# Patient Record
Sex: Male | Born: 1950
Health system: Southern US, Community
[De-identification: ages and names within clinical notes are randomized; demographics above are authoritative.]

## PROBLEM LIST (undated history)

## (undated) DIAGNOSIS — N2889 Other specified disorders of kidney and ureter: Secondary | ICD-10-CM

## (undated) DIAGNOSIS — T4145XA Adverse effect of unspecified anesthetic, initial encounter: Secondary | ICD-10-CM

## (undated) DIAGNOSIS — Z9889 Other specified postprocedural states: Secondary | ICD-10-CM

## (undated) DIAGNOSIS — K219 Gastro-esophageal reflux disease without esophagitis: Secondary | ICD-10-CM

## (undated) DIAGNOSIS — C439 Malignant melanoma of skin, unspecified: Secondary | ICD-10-CM

## (undated) DIAGNOSIS — F419 Anxiety disorder, unspecified: Secondary | ICD-10-CM

## (undated) DIAGNOSIS — I10 Essential (primary) hypertension: Secondary | ICD-10-CM

## (undated) DIAGNOSIS — G4730 Sleep apnea, unspecified: Secondary | ICD-10-CM

## (undated) DIAGNOSIS — M1990 Unspecified osteoarthritis, unspecified site: Secondary | ICD-10-CM

## (undated) DIAGNOSIS — R112 Nausea with vomiting, unspecified: Secondary | ICD-10-CM

## (undated) DIAGNOSIS — T8859XA Other complications of anesthesia, initial encounter: Secondary | ICD-10-CM

## (undated) DIAGNOSIS — N400 Enlarged prostate without lower urinary tract symptoms: Secondary | ICD-10-CM

## (undated) DIAGNOSIS — J849 Interstitial pulmonary disease, unspecified: Secondary | ICD-10-CM

## (undated) DIAGNOSIS — E785 Hyperlipidemia, unspecified: Secondary | ICD-10-CM

## (undated) HISTORY — DX: Interstitial pulmonary disease, unspecified: J84.9

## (undated) HISTORY — PX: TOTAL KNEE ARTHROPLASTY: SHX125

## (undated) HISTORY — DX: Sleep apnea, unspecified: G47.30

## (undated) HISTORY — DX: Anxiety disorder, unspecified: F41.9

## (undated) HISTORY — DX: Malignant melanoma of skin, unspecified: C43.9

## (undated) HISTORY — DX: Hyperlipidemia, unspecified: E78.5

## (undated) HISTORY — PX: MELANOMA EXCISION: SHX5266

## (undated) HISTORY — DX: Gastro-esophageal reflux disease without esophagitis: K21.9

## (undated) HISTORY — PX: KNEE ARTHROSCOPY: SUR90

## (undated) HISTORY — PX: LASIK: SHX215

## (undated) HISTORY — DX: Unspecified osteoarthritis, unspecified site: M19.90

## (undated) HISTORY — PX: PROSTATE ABLATION: SHX6042

## (undated) HISTORY — DX: Benign prostatic hyperplasia without lower urinary tract symptoms: N40.0

## (undated) HISTORY — DX: Other specified disorders of kidney and ureter: N28.89

## (undated) HISTORY — DX: Essential (primary) hypertension: I10

## (undated) HISTORY — PX: VASECTOMY: SHX75

---

## 1999-07-03 ENCOUNTER — Ambulatory Visit (HOSPITAL_BASED_OUTPATIENT_CLINIC_OR_DEPARTMENT_OTHER): Admission: RE | Admit: 1999-07-03 | Discharge: 1999-07-03 | Payer: Self-pay | Admitting: Urology

## 1999-07-03 ENCOUNTER — Encounter (INDEPENDENT_AMBULATORY_CARE_PROVIDER_SITE_OTHER): Payer: Self-pay | Admitting: Specialist

## 2000-05-29 ENCOUNTER — Ambulatory Visit (HOSPITAL_BASED_OUTPATIENT_CLINIC_OR_DEPARTMENT_OTHER): Admission: RE | Admit: 2000-05-29 | Discharge: 2000-05-29 | Payer: Self-pay | Admitting: Orthopedic Surgery

## 2001-02-25 ENCOUNTER — Ambulatory Visit (HOSPITAL_BASED_OUTPATIENT_CLINIC_OR_DEPARTMENT_OTHER): Admission: RE | Admit: 2001-02-25 | Discharge: 2001-02-25 | Payer: Self-pay | Admitting: Orthopedic Surgery

## 2002-03-28 ENCOUNTER — Ambulatory Visit (HOSPITAL_BASED_OUTPATIENT_CLINIC_OR_DEPARTMENT_OTHER): Admission: RE | Admit: 2002-03-28 | Discharge: 2002-03-28 | Payer: Self-pay | Admitting: *Deleted

## 2002-08-25 ENCOUNTER — Ambulatory Visit (HOSPITAL_BASED_OUTPATIENT_CLINIC_OR_DEPARTMENT_OTHER): Admission: RE | Admit: 2002-08-25 | Discharge: 2002-08-25 | Payer: Self-pay | Admitting: Orthopedic Surgery

## 2003-05-30 ENCOUNTER — Encounter: Admission: RE | Admit: 2003-05-30 | Discharge: 2003-08-28 | Payer: Self-pay | Admitting: *Deleted

## 2008-02-15 ENCOUNTER — Encounter: Admission: RE | Admit: 2008-02-15 | Discharge: 2008-02-15 | Payer: Self-pay | Admitting: Geriatric Medicine

## 2009-12-25 ENCOUNTER — Ambulatory Visit (HOSPITAL_COMMUNITY): Admission: RE | Admit: 2009-12-25 | Discharge: 2009-12-25 | Payer: Self-pay | Admitting: Geriatric Medicine

## 2010-06-02 ENCOUNTER — Encounter: Payer: Self-pay | Admitting: Geriatric Medicine

## 2010-08-23 ENCOUNTER — Encounter (HOSPITAL_COMMUNITY)
Admission: RE | Admit: 2010-08-23 | Discharge: 2010-08-23 | Disposition: A | Discharge status: Home / Self Care | Payer: BLUE CROSS/BLUE SHIELD | Source: Ambulatory Visit | Attending: Orthopedic Surgery | Admitting: Orthopedic Surgery

## 2010-08-23 ENCOUNTER — Ambulatory Visit (HOSPITAL_COMMUNITY)
Admission: RE | Admit: 2010-08-23 | Discharge: 2010-08-23 | Disposition: A | Discharge status: Home / Self Care | Payer: BLUE CROSS/BLUE SHIELD | Source: Ambulatory Visit | Attending: Orthopedic Surgery | Admitting: Orthopedic Surgery

## 2010-08-23 ENCOUNTER — Other Ambulatory Visit (HOSPITAL_COMMUNITY): Payer: Self-pay | Admitting: Orthopedic Surgery

## 2010-08-23 DIAGNOSIS — Z01812 Encounter for preprocedural laboratory examination: Secondary | ICD-10-CM | POA: Insufficient documentation

## 2010-08-23 DIAGNOSIS — IMO0002 Reserved for concepts with insufficient information to code with codable children: Secondary | ICD-10-CM | POA: Insufficient documentation

## 2010-08-23 DIAGNOSIS — M1712 Unilateral primary osteoarthritis, left knee: Secondary | ICD-10-CM

## 2010-08-23 DIAGNOSIS — Z01818 Encounter for other preprocedural examination: Secondary | ICD-10-CM | POA: Insufficient documentation

## 2010-08-23 DIAGNOSIS — R059 Cough, unspecified: Secondary | ICD-10-CM | POA: Insufficient documentation

## 2010-08-23 DIAGNOSIS — R05 Cough: Secondary | ICD-10-CM | POA: Insufficient documentation

## 2010-08-23 DIAGNOSIS — Z01810 Encounter for preprocedural cardiovascular examination: Secondary | ICD-10-CM | POA: Insufficient documentation

## 2010-08-23 DIAGNOSIS — M1710 Unilateral primary osteoarthritis, unspecified knee: Secondary | ICD-10-CM | POA: Insufficient documentation

## 2010-08-23 DIAGNOSIS — I10 Essential (primary) hypertension: Secondary | ICD-10-CM | POA: Insufficient documentation

## 2010-08-23 LAB — CBC
Hemoglobin: 14.7 g/dL (ref 13.0–17.0)
MCHC: 33.9 g/dL (ref 30.0–36.0)
Platelets: 206 10*3/uL (ref 150–400)
WBC: 11.7 10*3/uL — ABNORMAL HIGH (ref 4.0–10.5)

## 2010-08-23 LAB — URINALYSIS, ROUTINE W REFLEX MICROSCOPIC
Nitrite: NEGATIVE
Specific Gravity, Urine: 1.025 (ref 1.005–1.030)
Urobilinogen, UA: 1 mg/dL (ref 0.0–1.0)

## 2010-08-23 LAB — COMPREHENSIVE METABOLIC PANEL
ALT: 57 U/L — ABNORMAL HIGH (ref 0–53)
CO2: 28 mEq/L (ref 19–32)
Calcium: 9.4 mg/dL (ref 8.4–10.5)
Creatinine, Ser: 0.89 mg/dL (ref 0.4–1.5)
GFR calc non Af Amer: >60 mL/min (ref >60)
Glucose, Bld: 138 mg/dL — ABNORMAL HIGH (ref 70–99)
Total Bilirubin: 0.4 mg/dL (ref 0.3–1.2)

## 2010-08-23 LAB — SURGICAL PCR SCREEN: Staphylococcus aureus: NEGATIVE

## 2010-08-23 LAB — URINE MICROSCOPIC-ADD ON

## 2010-08-23 LAB — APTT: aPTT: 38 seconds — ABNORMAL HIGH (ref 24–37)

## 2010-08-23 LAB — PROTIME-INR: INR: 1.07 (ref 0.00–1.49)

## 2010-09-25 ENCOUNTER — Encounter (HOSPITAL_COMMUNITY)
Admission: RE | Admit: 2010-09-25 | Discharge: 2010-09-25 | Disposition: A | Discharge status: Home / Self Care | Payer: BLUE CROSS/BLUE SHIELD | Source: Ambulatory Visit | Attending: Orthopedic Surgery | Admitting: Orthopedic Surgery

## 2010-09-25 ENCOUNTER — Other Ambulatory Visit (HOSPITAL_COMMUNITY): Payer: BLUE CROSS/BLUE SHIELD

## 2010-09-25 LAB — COMPREHENSIVE METABOLIC PANEL
ALT: 69 U/L — ABNORMAL HIGH (ref 0–53)
Alkaline Phosphatase: 98 U/L (ref 39–117)
BUN: 14 mg/dL (ref 6–23)
CO2: 27 mEq/L (ref 19–32)
Chloride: 99 mEq/L (ref 96–112)
GFR calc non Af Amer: >60 mL/min (ref >60)
Glucose, Bld: 261 mg/dL — ABNORMAL HIGH (ref 70–99)
Potassium: 4.8 mEq/L (ref 3.5–5.1)
Sodium: 134 mEq/L — ABNORMAL LOW (ref 135–145)
Total Bilirubin: 0.4 mg/dL (ref 0.3–1.2)

## 2010-09-25 LAB — URINALYSIS, ROUTINE W REFLEX MICROSCOPIC
Hgb urine dipstick: NEGATIVE
Ketones, ur: NEGATIVE mg/dL
Protein, ur: NEGATIVE mg/dL
Urobilinogen, UA: 0.2 mg/dL (ref 0.0–1.0)

## 2010-09-25 LAB — CBC
HCT: 43.2 % (ref 39.0–52.0)
MCH: 30.9 pg (ref 26.0–34.0)
MCHC: 34 g/dL (ref 30.0–36.0)
MCV: 90.9 fL (ref 78.0–100.0)
RDW: 13.4 % (ref 11.5–15.5)

## 2010-09-25 LAB — PROTIME-INR: INR: 1.06 (ref 0.00–1.49)

## 2010-09-25 LAB — SURGICAL PCR SCREEN
MRSA, PCR: NEGATIVE
Staphylococcus aureus: NEGATIVE

## 2010-09-25 LAB — URINE MICROSCOPIC-ADD ON

## 2010-09-27 NOTE — Op Note (Signed)
NAME:  KYM, FENTER NO.:  [id number]   MEDICAL RECORD NO.:  [id number]                   PATIENT TYPE:  AMB   LOCATION:  DSC                                  FACILITY:  [facility]   PHYSICIAN:  Loreta Ave, M.D.              DATE OF BIRTH:  1951-03-24   DATE OF PROCEDURE:  08/25/2002  DATE OF DISCHARGE:                                 OPERATIVE REPORT   PREOPERATIVE DIAGNOSES:  Intra-articular adhesions, synovitis, degenerative  arthritis, left knee.   POSTOPERATIVE DIAGNOSES:  Intra-articular adhesions, synovitis, degenerative  arthritis, left knee with focal grade 3 change peak of the patella and  weightbearing dome, medial femoral condyle.   OPERATION PERFORMED:  Left knee examination under anesthesia, arthroscopy  with lysis debridement of adhesions.  Partial synovectomy.  Chondroplasty,  medial femoral condyle and patella.   SURGEON:  Loreta Ave, M.D.   ASSISTANT:  Arlys John D. Petrarca, P.A.-C.   ANESTHESIA:  Knee block with sedation.   SPECIMENS:  None.   CULTURES:  None.   COMPLICATIONS:  None.   DRESSING:  Soft compressive.   DESCRIPTION OF PROCEDURE:  The patient was brought to the operating room and  after adequate anesthesia had been obtained, the left knee examined.  Full  motion, good stability.  Some decreased patellofemoral mobility because of  adhesions.  No instability.  Tourniquet and leg holder applied.  Leg prepped  and draped in the usual sterile fashion.  Three portals were created, one  superolateral, one each medial and lateral parapatellar.  Inflow catheter  introduced.  Knee distended.  Arthroscope introduced, knee inspected.  Marked dense diffuse adhesions throughout the front of the knee, all of  these resected back to normal capsule.  Synovitis anterior, anteromedial,  anterolateral were all resected.  Focal grade 3 chondromalacia peak of  patella treated with chondroplasty.  Good tracking.  Medial  compartment had  grade 2 changes on the condyle treated with chondroplasty.  No full  thickness loss.  Some roughening on the front of the medial meniscus but not  a significant tear.  Tibial plateau medially looked good.  Some attrition of  the ACL which had some laxity but was still intact and functional.  A little  fraying along the margin debrided.  Lateral compartment, lateral meniscus  otherwise looked normal.  All aspects of the knee  thoroughly examined.  All adhesions, synovitis removed.  Instruments and  fluid removed.  Portals of the knee injected with Marcaine.  Portals were  closed with 4-0 nylon.  Sterile compressive dressing applied.  Anesthesia  reversed.  Brought to recovery room.  Tolerated surgery well.  No  complications.  Loreta Ave, M.D.    DFM/MEDQ  D:  08/25/2002  T:  08/25/2002  Job:  [id number]

## 2010-09-27 NOTE — Op Note (Signed)
[place]. [facility]  Patient:    Phillip Rich, Phillip Rich Visit Number: [id number] MRN: [id number]          Service Type: DSU Location: [facility] Attending Physician:  Colbert Ewing Dictated by:   Loreta Ave, M.D. Proc. Date: 02/25/01 Admit Date:  02/25/2001                             Operative Report  PREOPERATIVE DIAGNOSIS:  Localized synovitis, anterolateral aspect of left knee, intra-articular and extra-articular, after previous arthroscopy.  POSTOPERATIVE DIAGNOSES:  Localized synovitis, anterolateral aspect of left knee, intra-articular and extra-articular, after previous arthroscopy, and also intra-articular adhesions.  PROCEDURES: 1. Left knee examination under anesthesia, arthroscopy with debridement of    intra-articular synovitis and adhesions. 2. Exploration, anterolateral portal, with removal of subcutaneous adhesions    and inflammatory tissue.  SURGEON:  Loreta Ave, M.D.  ASSISTANT:  Arlys John D. Petrarca, P.A.-C.  ANESTHESIA:  General.  ESTIMATED BLOOD LOSS:  Minimal.  TOURNIQUET:  Not employed.  SPECIMENS:  None.  CULTURES:  None.  COMPLICATIONS:  None.  DRESSING:  Soft compressive.  DESCRIPTION OF PROCEDURE:  Patient brought to the operating room and placed on the operating table in the supine position.  After adequate anesthesia had been obtained, tourniquet and leg holder applied and leg prepped and draped in the usual sterile fashion.  Attention was first turned to the anterolateral portal adjacent to the patellar tendon.  This was opened longitudinally.  The thickened scar tissue and inflammatory tissue in the subcutaneous space was evacuated.  The entire area explored to be sure there were no meniscal fragments in the subcutaneous tissue that were causing mechanical symptoms.  I then used the superolateral and anteromedial portal to introduce the inflow catheter, distend the knee, and introduce the  arthroscope.  The anterior aspect of the anterolateral portal was then visualized.  An adhesive band across that area was resected with the shaver.  The surrounding inflammatory tissue was then resected with a shaver, coming though the anterolateral portal.  Surrounding synovitis in the front of the knee and in the lateral gutter also excised with hemostasis obtained with cautery.  The entire knee then thoroughly inspected.  Previous partial medial and lateral meniscectomies without new tears.  A little frayed tissue along the ACL lateral border, debrided with a shaver.  Remaining articular cartilage had some degenerative changes as seen previously but no progression.  Good patellofemoral tracking. Cruciate ligaments intact.  I then re-explored the anterolateral aspect both inside and outside to be sure there were no remaining foreign bodies or inflammatory tissue, and none was left.  Instruments and fluid removed. Portals and the knee injected with Marcaine.  Portals closed with nylon, including the closure of the longitudinal incision over the lateral portal. Sterile compressive dressing applied.  Anesthesia reversed.  Brought to the recovery room.  Tolerated the surgery well with no complications. Dictated by:   Loreta Ave, M.D. Attending Physician:  Colbert Ewing DD:  02/25/01 TD:  02/26/01 Job: [id number]

## 2010-09-27 NOTE — Op Note (Signed)
[place]. [facility]  Patient:    Phillip Rich, Phillip Rich                       MRN: [id number] Proc. Date: 07/03/99 Adm. Date:  [id number] Attending:  Thermon Leyland                           Operative Report  PREOPERATIVE DIAGNOSIS: 1. Right spermatocele. 2. Desire for elective sterilization.  POSTOPERATIVE DIAGNOSIS: 1. Right spermatocele. 2. Desire for elective sterilization.  OPERATION PERFORMED: 1. Excision of right spermatocele/partial epididymal resection. 2. Bilateral vasectomy.  SURGEON:  Barron Alvine, M.D.  ANESTHESIA:  General.  INDICATIONS FOR PROCEDURE:  Mr. Derusha is a 60 year old male.  He presented with enlargement of his right hemiscrotum with moderate intermittent discomfort.  He was diagnosed with a large spermatocele.  We have recommended observation unless this became more symptomatic.  It has increased in size and become more bothersome to him for daily activity.  He also elected to have bilateral vasectomy.  He understood the risks and benefits of elective sterilization as well as the risks and benefits of the spermatocele excision.  DESCRIPTION OF PROCEDURE:  The patient was brought to the operating room where e had successful induction of general anesthesia.  He was prepped and draped in the usual sterile manner.  A small horizontal incision was made in the right hemiscrotum.  The testis and a 6 cm spermatocele were then delivered out of the  incision.  It was clear that this was a multilocular spermatocele involving a good portion of the head of the epididymis.  The testis itself showed no gross abnormalities.  A combination of sharp and blunt dissecting technique was utilized to separate the spermatocele from the surrounding spermatic cord structures. Electrocautery was utilized to carefully remove the spermatocele from the surface of the testicle itself.  It was intertwined within a portion of the head of  the  epididymus.  We were able to shell out the entire spermatocele intact.  It measured approximately 6 cm across and was sent for pathologic confirmation.  We went ahead and isolated a segment of vas on that side as well and a 1 cm segment was removed. Both ends were then cauterized and then tied with interrupted Vicryl suture. Hemostasis was excellent.  The testis was put back in the hemiscrotum taking great care to make sure the spermatic cord was not twisted.  A Marcaine spermatic cord block was also performed.  Through the same incision, we were able to grab the contralateral vas deferens through the median raphe.  A small incision was made  over that and the vas was delivered through that previous incision.  In a similar manner, a piece of vas was removed and the ends of the vas were handled in a similar manner.  The Dartos was then closed with a running 3-0 Vicryl suture. he skin was closed with interrupted 4-0 Vicryl.  A light pressure dressing was applied.  The patient appeared to tolerate the procedure well.  Blood loss was minimal.  The sponge and needle counts were correct.  The patient was brought to the recovery room in stable condition. DD:  07/03/99 TD:  07/04/99 Job: [id number]

## 2010-09-27 NOTE — Op Note (Signed)
[place]. [facility]  Patient:    Phillip Rich, Phillip Rich                       MRN: [id number] Proc. Date: 05/29/00 Adm. Date:  [id number] Disc. Date: [id number] Attending:  Colbert Ewing                           Operative Report  PREOPERATIVE DIAGNOSIS: 1. Lateral meniscus tear, right knee. 2. Medial compartment degenerative joint disease. 3. Status post open medial meniscectomy.  POSTOPERATIVE DIAGNOSIS: 1. Right knee extensive degenerative tearing of the lateral meniscus and    degenerative tearing of what remained of the medial meniscus. 2. Osteochondral loose bodies x 3. 3. Grade 3 chondromalacia medial compartment and mild focal grade 3    chondromalacia lateral compartment. 4. Reactive synovitis.  OPERATION: 1. Right knee examined under anesthesia. 2. Arthroscopy. 3. Partial medial and lateral meniscectomies. 4. Removal of loose bodies x 3. 5. Partial synovectomy. 6. Generalized chondroplasty.  SURGEON:  Loreta Ave, M.D.  ASSISTANT:  Arlys John D. Petrarca, P.A.-C.  ANESTHESIA:  General.  ESTIMATED BLOOD LOSS:  Minimal.  TOURNIQUET TIME:  Not employed.  SPECIMENS: None.  CULTURES:  None.  COMPLICATIONS:  None.  DRESSINGS:  Soft compressive.  DESCRIPTION OF PROCEDURE:  The patient was brought to the operating room and placed on the operating table in supine position.  After adequate anesthesia had been obtained, knee examined.  Full motion stable knee.  Mild varus alignment.  Positive lateral McMurrays.  Tourniquet and leg holder applied. Leg prepped and draped in the usual sterile fashion.  Three portals created; one superolateral, one each medial and lateral parapatellar.  Inflow catheter introduced.  Standard arthroscope introduced.  Knee inspected.  Patellofemoral joint had good cartilage, no significant degenerative changes.  Good tracking. Medial compartment, diffuse grade 3 changes.  Medial femoral condyle degenerative  in nature, debrided to a stable surface.  Medial meniscus, of which there was about half of it remaining all the way around, had extensive complex displaced tears over the posterior half which were saucerized out to the rim, tapered into remaining meniscus salvaging some of the anterior half. Cruciate ligaments intact, but there was some attritional attenuation of the ACL, but this came to a good end point.  It was functional.  Three osteochondral loose bodies found; one in the suprapatellar pouch, one medial, and one in the notch, all removed with a grasping forceps.  These were between 5 and 6 mm in diameter.  Lateral compartment had some mild focal grade 3 chondromalacia lateral tibial plateau, debrided to a stable surface. Extensive complex irreparable tearing lateral meniscus posterior middle third.  This was saucerized out leaving about half the meniscus in the back and salvaging most of the anterior half.  At completion, all hypertrophic synovitis removed.  All recesses thoroughly examined to be sure all loose bodies were removed.  Instruments and fluid removed.  Portals of knee injected with Marcaine.  Portal closed with 4-0 nylon.  Sterile compressive dressing applied.  Anesthesia reversed.  Brought to recovery room.  Tolerated surgery well with no complications. DD:  05/29/00 TD:  05/31/00 Job: [id number]

## 2010-10-02 ENCOUNTER — Inpatient Hospital Stay (HOSPITAL_COMMUNITY)
Admission: RE | Admit: 2010-10-02 | Discharge: 2010-10-04 | DRG: 209 | Disposition: A | Discharge status: Home Health | Payer: BLUE CROSS/BLUE SHIELD | Source: Ambulatory Visit | Attending: Orthopedic Surgery | Admitting: Orthopedic Surgery

## 2010-10-02 ENCOUNTER — Inpatient Hospital Stay (HOSPITAL_COMMUNITY): Payer: BLUE CROSS/BLUE SHIELD

## 2010-10-02 DIAGNOSIS — K219 Gastro-esophageal reflux disease without esophagitis: Secondary | ICD-10-CM | POA: Diagnosis present

## 2010-10-02 DIAGNOSIS — E119 Type 2 diabetes mellitus without complications: Secondary | ICD-10-CM | POA: Diagnosis present

## 2010-10-02 DIAGNOSIS — M1710 Unilateral primary osteoarthritis, unspecified knee: Principal | ICD-10-CM | POA: Diagnosis present

## 2010-10-02 DIAGNOSIS — Z7982 Long term (current) use of aspirin: Secondary | ICD-10-CM

## 2010-10-02 DIAGNOSIS — I10 Essential (primary) hypertension: Secondary | ICD-10-CM | POA: Diagnosis present

## 2010-10-02 DIAGNOSIS — E7800 Pure hypercholesterolemia, unspecified: Secondary | ICD-10-CM | POA: Diagnosis present

## 2010-10-02 LAB — GLUCOSE, CAPILLARY
Glucose-Capillary: 262 mg/dL — ABNORMAL HIGH (ref 70–99)
Glucose-Capillary: 319 mg/dL — ABNORMAL HIGH (ref 70–99)

## 2010-10-03 LAB — BASIC METABOLIC PANEL
BUN: 10 mg/dL (ref 6–23)
CO2: 31 mEq/L (ref 19–32)
Calcium: 8.9 mg/dL (ref 8.4–10.5)
Chloride: 96 mEq/L (ref 96–112)
Creatinine, Ser: 0.79 mg/dL (ref 0.4–1.5)
GFR calc Af Amer: >60 mL/min (ref >60)

## 2010-10-03 LAB — CBC
Platelets: 172 10*3/uL (ref 150–400)
RBC: 3.81 MIL/uL — ABNORMAL LOW (ref 4.22–5.81)
RDW: 13.5 % (ref 11.5–15.5)
WBC: 8.4 10*3/uL (ref 4.0–10.5)

## 2010-10-03 LAB — GLUCOSE, CAPILLARY
Glucose-Capillary: 210 mg/dL — ABNORMAL HIGH (ref 70–99)
Glucose-Capillary: 266 mg/dL — ABNORMAL HIGH (ref 70–99)
Glucose-Capillary: 171 mg/dL — ABNORMAL HIGH (ref 70–99)

## 2010-10-03 LAB — PROTIME-INR: Prothrombin Time: 15.7 seconds — ABNORMAL HIGH (ref 11.6–15.2)

## 2010-10-04 LAB — BASIC METABOLIC PANEL
BUN: 12 mg/dL (ref 6–23)
CO2: 26 mEq/L (ref 19–32)
Calcium: 9.1 mg/dL (ref 8.4–10.5)
Chloride: 99 mEq/L (ref 96–112)
Creatinine, Ser: 0.75 mg/dL (ref 0.4–1.5)
GFR calc Af Amer: >60 mL/min (ref >60)
GFR calc non Af Amer: >60 mL/min (ref >60)
Sodium: 133 mEq/L — ABNORMAL LOW (ref 135–145)

## 2010-10-04 LAB — CBC
Hemoglobin: 10.4 g/dL — ABNORMAL LOW (ref 13.0–17.0)
MCH: 31 pg (ref 26.0–34.0)
MCHC: 34 g/dL (ref 30.0–36.0)
MCV: 91.1 fL (ref 78.0–100.0)
Platelets: 168 10*3/uL (ref 150–400)
RBC: 3.36 MIL/uL — ABNORMAL LOW (ref 4.22–5.81)

## 2010-10-04 LAB — GLUCOSE, CAPILLARY: Glucose-Capillary: 196 mg/dL — ABNORMAL HIGH (ref 70–99)

## 2010-10-06 NOTE — Op Note (Signed)
NAME:  Phillip Rich, Phillip Rich NO.:  [id number]  MEDICAL RECORD NO.:  [id number]           PATIENT TYPE:  I  LOCATION:  5008                         FACILITY:  [facility]  PHYSICIAN:  Loreta Ave, M.D. DATE OF BIRTH:  Feb 24, 1951  DATE OF PROCEDURE:  10/02/2010 DATE OF DISCHARGE:                              OPERATIVE REPORT   PREOPERATIVE DIAGNOSIS:  End-stage degenerative arthritis, left knee.  POSTOPERATIVE DIAGNOSIS:  End-stage degenerative arthritis, left knee.  PROCEDURE:  Modified minimally invasive left total knee replacement with Stryker Triathlon prosthesis.  Soft tissue balancing.  Cemented pegged posterior stabilized #5 femoral component.  Cemented #6 tibial component 9-mm polyethylene insert.  Resurfacing cemented 29-mm patellar component.  SURGEON:  Loreta Ave, MD  ASSISTANT:  Genene Churn. Barry Dienes, [place], present throughout the entire case and necessary for timely completion of procedure.  ANESTHESIA:  General.  BLOOD LOSS:  Minimal.  SPECIMEN:  None.  CULTURES:  None.  COMPLICATIONS:  None.  PROCEDURE:  Sterile compressive.  DRAIN:  Hemovac x1.  Knee immobilizer.  TOURNIQUET TIME:  One hour.  PROCEDURE:  The patient was brought to the operating room and placed on the operating table in supine position.  After adequate anesthesia had been obtained, knee was examined.  Fairly good alignment.  Motion 0-100. Grating crepitus.  Tourniquet was applied.  Prepped and draped in usual sterile fashion.  Exsanguinated with elevation of Esmarch and tourniquet inflated to 350 mmHg.  A straight incision above the patella down to the tibial tubercle.  Medial arthrotomy, vastus splitting, preserving quad tendon.  Knee was exposed.  Most marked changes medial with the at least grade 3 in the other compartments.  Remnants of menisci, cruciate ligaments removed.  Distal femur exposed.  Intramedullary guide placed. Distal resection of the femur 10-mm set at 5  degrees of valgus.  Using epicondylar axis, the femur was size, cut and fitted for a posterior stabilized #5 femoral component.  Attention turned to the tibia. Extramedullary guide.  A 3-degree posterior slope cut.  Sized to #6 component.  All recess examined to be sure, all spurs and loose bodies were removed.  A #5 placed on the femur, #6 on the tibia, and a 9-mm insert.  Patella exposed, posterior 10-mm removed.  Drilled sized and fitted for a 38-mm patella.  With all the trials in place, I was very pleased mechanical axis full extension, full flexion, nice stability and balancing in flexion/extension as well as good patellofemoral tracking. Tibia was marked for rotation and then hand reamed.  All trials were removed.  Copious irrigation with a pulse irrigating device.  Cement prepared, placed on all components, which were firmly seated. Polyethylene attached to the tibia.  Knee reduced.  Patella held with a clamp.  Once the cement had hardened, the knee was reexamined.  Again pleased with alignment, stability, and motion.  Wound irrigated. Hemovac placed and brought through a separate stab wound.  Arthrotomy closed with #1 Vicryl.  Skin and subcutaneous tissue with Vicryl and staples.  Sterile compressive dressing applied.  Tourniquet was deflated and removed.  Knee immobilizer applied.  Anesthesia reversed.  Brought to the recovery room.  Tolerated the surgery well.  No complications.     Loreta Ave, M.D.     DFM/MEDQ  D:  10/02/2010  T:  10/03/2010  Job:  [id number]  Electronically Signed by Mckinley Jewel M.D. on 10/06/2010 11:38:21 AM

## 2010-10-15 ENCOUNTER — Other Ambulatory Visit: Payer: Self-pay | Admitting: Orthopedic Surgery

## 2010-10-15 ENCOUNTER — Ambulatory Visit
Admission: RE | Admit: 2010-10-15 | Discharge: 2010-10-15 | Disposition: A | Discharge status: Home / Self Care | Payer: BLUE CROSS/BLUE SHIELD | Source: Ambulatory Visit | Attending: Orthopedic Surgery | Admitting: Orthopedic Surgery

## 2010-10-15 DIAGNOSIS — R0682 Tachypnea, not elsewhere classified: Secondary | ICD-10-CM

## 2010-10-15 MED ORDER — IOHEXOL 300 MG/ML  SOLN
125.0000 mL | Freq: Once | INTRAMUSCULAR | Status: AC | PRN
  Administered 2010-10-15: 125 mL via INTRAVENOUS

## 2010-10-25 ENCOUNTER — Encounter: Payer: Self-pay | Admitting: Internal Medicine

## 2010-10-25 ENCOUNTER — Other Ambulatory Visit: Payer: Self-pay | Admitting: Internal Medicine

## 2010-10-25 ENCOUNTER — Ambulatory Visit (INDEPENDENT_AMBULATORY_CARE_PROVIDER_SITE_OTHER): Payer: BLUE CROSS/BLUE SHIELD | Admitting: Internal Medicine

## 2010-10-25 DIAGNOSIS — R05 Cough: Secondary | ICD-10-CM

## 2010-10-25 DIAGNOSIS — R059 Cough, unspecified: Secondary | ICD-10-CM

## 2010-10-25 DIAGNOSIS — R0602 Shortness of breath: Secondary | ICD-10-CM

## 2010-10-25 DIAGNOSIS — F411 Generalized anxiety disorder: Secondary | ICD-10-CM

## 2010-10-25 DIAGNOSIS — R599 Enlarged lymph nodes, unspecified: Secondary | ICD-10-CM

## 2010-10-25 DIAGNOSIS — F419 Anxiety disorder, unspecified: Secondary | ICD-10-CM

## 2010-10-25 NOTE — Progress Notes (Signed)
Subjective:    Patient ID: Phillip Rich, male    DOB: 26-Oct-1950, 60 y.o.   MRN: [id number]  HPI 60 year old obese male. Mod OSA by hx.DM, BP, Hyperlipidemia. On ACE inhibitor for a "good while". Also, 1 ppd active smoker since 15 (> 50 pack). Has had chronic cough since early 2012 but without other complaints. In mid-April 2012 had routine preop CXR for Left knee TKR. Found to have some ILD changes (personally reviewed)  but Rx for pna. Underwent uneventful left TKR 10/02/2010. Post surgery cough resolved. Was given oxycodone for pain but this made him "loopy", nauseated, and vomitting. After this given hydrocodone which made him worse. So by 10/15/2010 these medications were stopped when he fu at ortho Mr Barry Dienes.  However, few days before that developed acute onset dyspnea. So, on 10/15/2010 at ortho visit had CT chest that showed 2.8Cm Rt hilar mass (im#59) with additional prominent mediastinal lymph nodes. In addition, there was emphysema at base with GGO and other chronic ILD changes (R> L). Since coming off narcs, dyspnea has improved a lot. No more vomitting. However, rehab has been slow and wife feels he gets very anxious easily due to slow recovery and gets dyspneic walking with feeling dizzy. In fact, when he walked in to office he was visibly dyspneic. Lying in bed in patient room even after several minutes, he was very tachypneic and admitted to "hyperventilating".  Walking him today - he took a long time to walk 185 feet x 3 laps. Did not desaturate and HR rose to 120    Review of Systems  Constitutional: Negative for fever and unexpected weight change.  HENT: Negative for ear pain, nosebleeds, congestion, sore throat, rhinorrhea, sneezing, trouble swallowing, dental problem, postnasal drip and sinus pressure.   Eyes: Negative for redness and itching.  Respiratory: Positive for shortness of breath. Negative for cough, chest tightness and wheezing.   Cardiovascular: Negative for palpitations  and leg swelling.  Gastrointestinal: Negative for nausea and vomiting.  Genitourinary: Negative for dysuria.  Musculoskeletal: Negative for joint swelling.  Skin: Negative for rash.  Neurological: Negative for headaches.  Hematological: Does not bruise/bleed easily.  Psychiatric/Behavioral: Negative for dysphoric mood. The patient is not nervous/anxious.        Objective:   Physical Exam  Nursing note and vitals reviewed. Constitutional: He is oriented to person, place, and time. He appears well-developed and well-nourished. No distress.       obese  HENT:  Head: Normocephalic and atraumatic.  Right Ear: External ear normal.  Left Ear: External ear normal.  Mouth/Throat: Oropharynx is clear and moist. No oropharyngeal exudate.  Eyes: Conjunctivae and EOM are normal. Pupils are equal, round, and reactive to light. Right eye exhibits no discharge. Left eye exhibits no discharge. No scleral icterus.  Neck: Normal range of motion. Neck supple. No JVD present. No tracheal deviation present. No thyromegaly present.  Cardiovascular: Normal rate, regular rhythm and intact distal pulses.  Exam reveals no gallop and no friction rub.   No murmur heard. Pulmonary/Chest: Effort normal and breath sounds normal. No respiratory distress. He has no wheezes. He has no rales. He exhibits no tenderness.  Abdominal: Soft. Bowel sounds are normal. He exhibits no distension and no mass. There is no tenderness. There is no rebound and no guarding.  Musculoskeletal: Normal range of motion. He exhibits no edema and no tenderness.       Left knee in scar Walks with walker  Lymphadenopathy:  He has no cervical adenopathy.  Neurological: He is alert and oriented to person, place, and time. He has normal reflexes. No cranial nerve deficit. Coordination normal.  Skin: Skin is warm and dry. No rash noted. He is not diaphoretic. No erythema. No pallor.  Psychiatric: He has a normal mood and affect. His behavior  is normal. Judgment and thought content normal.       anxious          Assessment & Plan:

## 2010-10-25 NOTE — Patient Instructions (Addendum)
#  Shortness of breath My suspicion is that when you took oxycodone/hydrocodone and were vomiting some of this went down your lungs and made you short of breath and have caused the changes on your CT In addition, these medications probably made sleep apnea worse In addition, there is likely underlying copd, or maybe even lung scarring that could be chronic related or not due to smoking IN addition, some anxiety might be making things worse too But I am glad you did not drop oxygen levels when you walked and you are feeling better You need followup CT chest without contrast in 6 weeks from 10/15/2010 You need full PFT breathing test around time of CT chest #SSwollen lymph gland in chest  This might be related to aspiration reaction when you vomited with the pain killers - You need PET CT chest  6 weeks from June 5th CT chst  #Smoking  - quit smoking #Chronic cough  - stop lisnopril  - my nurse will give you 20mg  benicar 8 week sample take 1 a day #Anxiety  - please talk to [name]  #Followup  - 6 weeks after PFT and CT chest  - continue rehab and PT till then

## 2010-10-27 ENCOUNTER — Emergency Department (HOSPITAL_COMMUNITY)
Admission: EM | Admit: 2010-10-27 | Discharge: 2010-10-27 | Disposition: A | Discharge status: Home / Self Care | Payer: BC Managed Care – PPO | Attending: Emergency Medicine | Admitting: Emergency Medicine

## 2010-10-27 ENCOUNTER — Encounter: Payer: Self-pay | Admitting: Internal Medicine

## 2010-10-27 DIAGNOSIS — I10 Essential (primary) hypertension: Secondary | ICD-10-CM | POA: Insufficient documentation

## 2010-10-27 DIAGNOSIS — E119 Type 2 diabetes mellitus without complications: Secondary | ICD-10-CM | POA: Insufficient documentation

## 2010-10-27 DIAGNOSIS — R0602 Shortness of breath: Secondary | ICD-10-CM | POA: Insufficient documentation

## 2010-10-27 DIAGNOSIS — E789 Disorder of lipoprotein metabolism, unspecified: Secondary | ICD-10-CM | POA: Insufficient documentation

## 2010-10-27 DIAGNOSIS — R05 Cough: Secondary | ICD-10-CM | POA: Insufficient documentation

## 2010-10-27 DIAGNOSIS — Z96659 Presence of unspecified artificial knee joint: Secondary | ICD-10-CM | POA: Insufficient documentation

## 2010-10-27 DIAGNOSIS — F419 Anxiety disorder, unspecified: Secondary | ICD-10-CM | POA: Insufficient documentation

## 2010-10-27 DIAGNOSIS — M25569 Pain in unspecified knee: Secondary | ICD-10-CM | POA: Insufficient documentation

## 2010-10-27 DIAGNOSIS — R599 Enlarged lymph nodes, unspecified: Secondary | ICD-10-CM | POA: Insufficient documentation

## 2010-10-27 NOTE — Assessment & Plan Note (Signed)
Dc ace inhibitor Try benicar (sample instead) Reasasses in 4-6 weeks

## 2010-10-27 NOTE — Assessment & Plan Note (Signed)
Refer back to PMD

## 2010-10-27 NOTE — Assessment & Plan Note (Signed)
#  Shortness of breath My suspicion is that when you took oxycodone/hydrocodone and were vomiting some of this went down your lungs and made you short of breath and have caused the changes on your CT In addition, these medications probably made sleep apnea worse In addition, there is likely underlying copd, or maybe even lung scarring that could be chronic related or not due to smoking IN addition, some anxiety might be making things worse too But I am glad you did not drop oxygen levels when you walked and you are feeling better You need followup CT chest without contrast in 6 weeks from 10/15/2010 You need full PFT breathing test around time of CT chest

## 2010-10-27 NOTE — Assessment & Plan Note (Signed)
Suspect is reaction to aspiration pneumonitis following narc related vomitting  Plan Repeat pet ct chest 6 weeks from 10/15/2010

## 2010-11-26 ENCOUNTER — Encounter (HOSPITAL_COMMUNITY): Payer: Self-pay

## 2010-11-26 ENCOUNTER — Ambulatory Visit (HOSPITAL_COMMUNITY)
Admission: RE | Admit: 2010-11-26 | Discharge: 2010-11-26 | Disposition: A | Discharge status: Home / Self Care | Payer: BC Managed Care – PPO | Source: Ambulatory Visit | Attending: Internal Medicine | Admitting: Internal Medicine

## 2010-11-26 ENCOUNTER — Other Ambulatory Visit: Payer: BC Managed Care – PPO

## 2010-11-26 ENCOUNTER — Encounter (HOSPITAL_COMMUNITY)
Admission: RE | Admit: 2010-11-26 | Discharge: 2010-11-26 | Disposition: A | Discharge status: Home / Self Care | Payer: BC Managed Care – PPO | Source: Ambulatory Visit | Attending: Internal Medicine | Admitting: Internal Medicine

## 2010-11-26 DIAGNOSIS — R222 Localized swelling, mass and lump, trunk: Secondary | ICD-10-CM | POA: Insufficient documentation

## 2010-11-26 DIAGNOSIS — Z79899 Other long term (current) drug therapy: Secondary | ICD-10-CM | POA: Insufficient documentation

## 2010-11-26 DIAGNOSIS — Z7982 Long term (current) use of aspirin: Secondary | ICD-10-CM | POA: Insufficient documentation

## 2010-11-26 DIAGNOSIS — R0602 Shortness of breath: Secondary | ICD-10-CM

## 2010-11-26 DIAGNOSIS — J8410 Pulmonary fibrosis, unspecified: Secondary | ICD-10-CM | POA: Insufficient documentation

## 2010-11-26 DIAGNOSIS — F17200 Nicotine dependence, unspecified, uncomplicated: Secondary | ICD-10-CM | POA: Insufficient documentation

## 2010-11-26 DIAGNOSIS — R599 Enlarged lymph nodes, unspecified: Secondary | ICD-10-CM

## 2010-11-26 DIAGNOSIS — K449 Diaphragmatic hernia without obstruction or gangrene: Secondary | ICD-10-CM | POA: Insufficient documentation

## 2010-11-26 DIAGNOSIS — I10 Essential (primary) hypertension: Secondary | ICD-10-CM | POA: Insufficient documentation

## 2010-11-26 DIAGNOSIS — E119 Type 2 diabetes mellitus without complications: Secondary | ICD-10-CM | POA: Insufficient documentation

## 2010-11-26 LAB — GLUCOSE, CAPILLARY: Glucose-Capillary: 153 mg/dL — ABNORMAL HIGH (ref 70–99)

## 2010-11-26 MED ORDER — FLUDEOXYGLUCOSE F - 18 (FDG) INJECTION
15.6000 | Freq: Once | INTRAVENOUS | Status: AC | PRN
  Administered 2010-11-26: 15.6 via INTRAVENOUS

## 2010-11-29 ENCOUNTER — Telehealth: Payer: Self-pay | Admitting: Internal Medicine

## 2010-11-29 NOTE — Telephone Encounter (Signed)
I advised the pt the MR request he come in for f/u to discuss results. The pt is already set for 12-10-10 at 9am for PFT and then ov after. Pt aware. Carron Curie, CMA

## 2010-12-10 ENCOUNTER — Ambulatory Visit (INDEPENDENT_AMBULATORY_CARE_PROVIDER_SITE_OTHER): Payer: BC Managed Care – PPO | Admitting: Internal Medicine

## 2010-12-10 ENCOUNTER — Other Ambulatory Visit (INDEPENDENT_AMBULATORY_CARE_PROVIDER_SITE_OTHER): Payer: BC Managed Care – PPO

## 2010-12-10 VITALS — BP 122/76 | HR 106 | Temp 97.8°F | Ht 70.0 in | Wt 228.0 lb

## 2010-12-10 DIAGNOSIS — J84115 Respiratory bronchiolitis interstitial lung disease: Secondary | ICD-10-CM

## 2010-12-10 DIAGNOSIS — J849 Interstitial pulmonary disease, unspecified: Secondary | ICD-10-CM

## 2010-12-10 DIAGNOSIS — Z720 Tobacco use: Secondary | ICD-10-CM

## 2010-12-10 DIAGNOSIS — K219 Gastro-esophageal reflux disease without esophagitis: Secondary | ICD-10-CM

## 2010-12-10 DIAGNOSIS — F17200 Nicotine dependence, unspecified, uncomplicated: Secondary | ICD-10-CM

## 2010-12-10 DIAGNOSIS — M359 Systemic involvement of connective tissue, unspecified: Secondary | ICD-10-CM

## 2010-12-10 DIAGNOSIS — R0602 Shortness of breath: Secondary | ICD-10-CM

## 2010-12-10 DIAGNOSIS — R599 Enlarged lymph nodes, unspecified: Secondary | ICD-10-CM

## 2010-12-10 DIAGNOSIS — R05 Cough: Secondary | ICD-10-CM

## 2010-12-10 DIAGNOSIS — R059 Cough, unspecified: Secondary | ICD-10-CM

## 2010-12-10 LAB — PULMONARY FUNCTION TEST

## 2010-12-10 LAB — CK: Total CK: 68 U/L (ref 7–232)

## 2010-12-10 MED ORDER — OLMESARTAN MEDOXOMIL 20 MG PO TABS: 20.0000 mg | ORAL_TABLET | Freq: Every day | ORAL | Status: DC

## 2010-12-10 NOTE — Patient Instructions (Addendum)
#  smoking  - please work on quitting smoking #Lung scarring  -please have blood work for autoimmune disease: ana, ds dna, esr, anti ccp, rf,, ssA, ssB, scl 70, ck,   - quit smoking - start exercises as you have planned #Acid reflux - take zegerid on empty stomach 20mg  instead of omeprazole  - take diet sheet and follow it #cough  - this is due to lisinopril (are you sure you stopped it because it still shows on our med list?) - take script for benicar - other causes are smoking, scarring of lung and acid reflux #lymph gland in chest  - improving. No more followup for this #Followup - 3 months  - will call you with lab results

## 2010-12-10 NOTE — Progress Notes (Signed)
Subjective:    Patient ID: Phillip Rich, male    DOB: [birth year], 60 y.o.   MRN: [id number]  HPI  Problem list 1. Dyspnea  - multifactorial due to obesity, new dx of chronic ILD, and acute worsening June 2012 due to narcs/benzo in setting post op knee replacment. Did not desaturate 185 feet x 3 laps mid June 2012 2. Chronic cough - since feb 2012. Multifactorial - ACe inhibitor, GERD, ILD 3. Mediastinal node - 2.8cm R Hilar mass n CT 10/15/2010 4. ILD - noted during dyspnea eval in June 2012 5. Tobacco abuse  OV 12/10/2010: Followup for all of above. Dyspnea is much improved after he stopped narcs/benzo. In fact, resolved. Cough still present though possibly better - ? Still on ace inhibitor. GERD is active per patient and diet not great. STill smoking too; knows he has to quit but addicted and struggles. Prior has quit but relapsed due to significant other. CT chest (reviewed) followup now shows resolution of hilar mass but persistence of GGO without honeycombing. PFTs, correlate with pulmonary infiltrates with isolated low DLCO 54% (otherwise normal). He has lot of questions about ILD  Past, Family, Social reviewed: no changes noted and documented below  Past Medical History  Diagnosis Date  . DJD (degenerative joint disease)   . HTN (hypertension)   . Diabetes mellitus   . Hyperlipidemia      Family History  Problem Relation Age of Onset  . Asthma Sister   . Asthma Sister   . Heart disease Father   . Rheum arthritis Sister     x3  . Lung cancer Mother   . Colon cancer Sister      History   Social History  . Marital Status: Single    Spouse Name: N/A    Number of Children: N/A  . Years of Education: N/A   Occupational History  . retired    Social History Main Topics  . Smoking status: Current Everyday Smoker -- 1.0 packs/day for 40 years    Types: Cigarettes  . Smokeless tobacco: Not on file  . Alcohol Use: No  . Drug Use: No  . Sexually Active: Not on file    Other Topics Concern  . Not on file   Social History Narrative  . No narrative on file     Allergies  Allergen Reactions  . Codeine Nausea And Vomiting  . Crestor (Rosuvastatin Calcium) Other (See Comments)    Leg aches  . Erythromycin Rash    All Mycin drugs     Outpatient Prescriptions Prior to Visit  Medication Sig Dispense Refill  . aspirin 81 MG tablet Take 81 mg by mouth daily.        Marland Kitchen glimepiride (AMARYL) 4 MG tablet Take 1 tablet by mouth Twice daily.      Marland Kitchen HYDROmorphone (DILAUDID) 2 MG tablet Take 1 tablet by mouth Every 4 hours as needed.      Marland Kitchen JANUMET 50-1000 MG per tablet Take 1 tablet by mouth Twice daily.      Marland Kitchen lisinopril (PRINIVIL,ZESTRIL) 10 MG tablet Take 1 tablet by mouth Daily.      . Loratadine 10 MG CAPS Take 1 capsule by mouth daily.        . methocarbamol (ROBAXIN) 500 MG tablet Take 1 tablet by mouth Every 6 hours as needed.      Marland Kitchen omega-3 acid ethyl esters (LOVAZA) 1 G capsule Take 2 g by mouth 2 (two) times daily.        Marland Kitchen  omeprazole (PRILOSEC) 20 MG capsule Take 1 tablet by mouth Daily.      . promethazine (PHENERGAN) 12.5 MG tablet Take 1 tablet by mouth Every 6 hours as needed.      . simvastatin (ZOCOR) 20 MG tablet Take 1 tablet by mouth Daily.      Marland Kitchen warfarin (COUMADIN) 5 MG tablet as directed.          Review of Systems  Constitutional: Negative for fever and unexpected weight change.  HENT: Negative for ear pain, nosebleeds, congestion, sore throat, rhinorrhea, sneezing, trouble swallowing, dental problem, postnasal drip and sinus pressure.   Eyes: Negative for redness and itching.  Respiratory: Negative for cough, chest tightness, shortness of breath and wheezing.   Cardiovascular: Negative for palpitations and leg swelling.  Gastrointestinal: Negative for nausea and vomiting.  Genitourinary: Negative for dysuria.  Musculoskeletal: Negative for joint swelling.  Skin: Negative for rash.  Neurological: Negative for headaches.   Hematological: Does not bruise/bleed easily.  Psychiatric/Behavioral: Negative for dysphoric mood. The patient is not nervous/anxious.        Objective:   Physical Exam  Nursing note and vitals reviewed. Constitutional: He is oriented to person, place, and time. He appears well-developed and well-nourished. No distress.       obese  HENT:  Head: Normocephalic and atraumatic.  Right Ear: External ear normal.  Left Ear: External ear normal.  Mouth/Throat: Oropharynx is clear and moist. No oropharyngeal exudate.  Eyes: Conjunctivae and EOM are normal. Pupils are equal, round, and reactive to light. Right eye exhibits no discharge. Left eye exhibits no discharge. No scleral icterus.  Neck: Normal range of motion. Neck supple. No JVD present. No tracheal deviation present. No thyromegaly present.  Cardiovascular: Normal rate, regular rhythm and intact distal pulses.  Exam reveals no gallop and no friction rub.   No murmur heard. Pulmonary/Chest: Effort normal and breath sounds normal. No respiratory distress. He has no wheezes. He has no rales. He exhibits no tenderness.       No obvious crackles  Abdominal: Soft. Bowel sounds are normal. He exhibits no distension and no mass. There is no tenderness. There is no rebound and no guarding.  Musculoskeletal: Normal range of motion. He exhibits no edema and no tenderness.  Lymphadenopathy:    He has no cervical adenopathy.  Neurological: He is alert and oriented to person, place, and time. He has normal reflexes. No cranial nerve deficit. Coordination normal.  Skin: Skin is warm and dry. No rash noted. He is not diaphoretic. No erythema. No pallor.  Psychiatric: He has a normal mood and affect. His behavior is normal. Judgment and thought content normal.          Assessment & Plan:   No problem-specific assessment & plan notes found for this encounter.

## 2010-12-10 NOTE — Progress Notes (Signed)
PFT done today. 

## 2010-12-11 LAB — ANTI-SCLERODERMA ANTIBODY: Scleroderma (Scl-70) (ENA) Antibody, IgG: 2 AU/mL (ref <30)

## 2010-12-11 LAB — SJOGRENS SYNDROME-B EXTRACTABLE NUCLEAR ANTIBODY: SSB (La) (ENA) Antibody, IgG: <1 AU/mL (ref <30)

## 2010-12-11 LAB — ANTI-DNA ANTIBODY, DOUBLE-STRANDED: ds DNA Ab: 2 IU/mL (ref <30)

## 2010-12-11 LAB — RHEUMATOID FACTOR: Rhuematoid fact SerPl-aCnc: <10 IU/mL (ref <=14)

## 2010-12-11 LAB — ANA: Anti Nuclear Antibody(ANA): NEGATIVE

## 2010-12-17 ENCOUNTER — Telehealth: Payer: Self-pay | Admitting: Internal Medicine

## 2010-12-18 NOTE — Telephone Encounter (Signed)
LMOM TCB x1.  PA usually take around 2 weeks to complete.  Will be happy to give some samples.

## 2010-12-19 ENCOUNTER — Encounter: Payer: Self-pay | Admitting: Internal Medicine

## 2010-12-19 DIAGNOSIS — J849 Interstitial pulmonary disease, unspecified: Secondary | ICD-10-CM | POA: Insufficient documentation

## 2010-12-19 DIAGNOSIS — Z720 Tobacco use: Secondary | ICD-10-CM | POA: Insufficient documentation

## 2010-12-19 DIAGNOSIS — K219 Gastro-esophageal reflux disease without esophagitis: Secondary | ICD-10-CM | POA: Insufficient documentation

## 2010-12-19 MED ORDER — LOSARTAN POTASSIUM 25 MG PO TABS: 25.0000 mg | ORAL_TABLET | Freq: Every day | ORAL | Status: DC

## 2010-12-19 NOTE — Assessment & Plan Note (Signed)
Hilar mass resolved on CT July 2012 No furhter followp

## 2010-12-19 NOTE — Telephone Encounter (Signed)
MR recs to change Cozaar 25mg  once daily.  Pt aware and new rx sent. Carron Curie, CMA

## 2010-12-19 NOTE — Assessment & Plan Note (Addendum)
Explained ILD and ddx Check autoimmune profile Needs to quit smoking and  Control gerd If unable to quit and/or ILD persists, will have to address VATS bipsy esp if autoimmne profile negative

## 2010-12-19 NOTE — Assessment & Plan Note (Signed)
rx ppi Diet advice given Explained possible role of gerd in ILD

## 2010-12-19 NOTE — Assessment & Plan Note (Signed)
Unclear if he stopped ACE. Will switch him to ARB sample benicar GERD addvice given

## 2010-12-19 NOTE — Telephone Encounter (Signed)
Received form and it list alternatives so I will have MR look this over.Carron Curie, CMA

## 2010-12-19 NOTE — Telephone Encounter (Signed)
Samples left at front. Pt aware. PA initiated at [phone].. Will await form to be faxed to triage. Carron Curie, CMA

## 2010-12-19 NOTE — Assessment & Plan Note (Signed)
5 min couneling on quitting and problems of addiction and coping mechanisims. He is not ready yet. Encouraged to quit and think about quitting. Explained that smoking could be causing his interstitial lung disease

## 2011-07-02 ENCOUNTER — Ambulatory Visit (INDEPENDENT_AMBULATORY_CARE_PROVIDER_SITE_OTHER): Payer: BC Managed Care – PPO | Admitting: Physician Assistant

## 2011-07-02 VITALS — BP 119/74 | HR 87 | Temp 98.4°F | Resp 18 | Ht 70.0 in | Wt 238.0 lb

## 2011-07-02 DIAGNOSIS — H66009 Acute suppurative otitis media without spontaneous rupture of ear drum, unspecified ear: Secondary | ICD-10-CM

## 2011-07-02 DIAGNOSIS — J069 Acute upper respiratory infection, unspecified: Secondary | ICD-10-CM

## 2011-07-02 DIAGNOSIS — R05 Cough: Secondary | ICD-10-CM

## 2011-07-02 DIAGNOSIS — H6690 Otitis media, unspecified, unspecified ear: Secondary | ICD-10-CM

## 2011-07-02 MED ORDER — IPRATROPIUM BROMIDE 0.03 % NA SOLN: 2.0000 | Freq: Two times a day (BID) | NASAL | Status: DC

## 2011-07-02 MED ORDER — CEFDINIR 300 MG PO CAPS: 600.0000 mg | ORAL_CAPSULE | Freq: Every day | ORAL | Status: AC

## 2011-07-02 MED ORDER — MAGIC MOUTHWASH W/LIDOCAINE: 10.0000 mL | ORAL | Status: DC | PRN

## 2011-07-02 MED ORDER — BENZONATATE 100 MG PO CAPS: ORAL_CAPSULE | ORAL | Status: AC

## 2011-07-02 NOTE — Patient Instructions (Signed)
Take your medications as prescribed.  Get lots of rest and drink lots of fluids.

## 2011-07-02 NOTE — Progress Notes (Signed)
  Subjective:    Patient ID: Phillip Rich, male    DOB: [birth year], 61 y.o.   MRN: [id number]  HPI Patient presents with 3 days of cough, sore throat, nasal congestion and drainage. No significant myalgias other than when he coughs. Unable to sleep lying down secondary to postnasal drainage and cough. Subjective fevers. No urinary or GI symptoms. No rash. Multiple sick contacts 48 hours prior to onset of symptoms.  He is a smoker, one pack per day for 40 years. He has quit twice in the past. Thinks about quitting frequently but is not quite ready.   Review of Systems As above.    Objective:   Physical Exam Vital signs are noted. This is a well-developed obese white male who is awake, alert and oriented in no acute distress. HEENT is remarkable for nasal congestion, erythema and drainage in the posterior pharynx. Right TM is opaque injected and bulging in the attic. The left TM is clear. Neck is supple and nontender without lymphadenopathy or thyromegaly. Heart and lungs are clear. Peripheral pulses are symmetrically intact and skin is warm and dry.     Assessment & Plan:  Right acute otitis media. Cough. Viral URI.  Cefdinir 300 mg 2 by mouth daily. Atrovent nasal spray 2 sprays in each nostril twice daily. Tessalon Perles. Rest, fluids, Duke's Magic mouthwash mixed with lidocaine. Anticipatory guidance. Reevaluate if symptoms worsen or persist. Smoking cessation encouraged.

## 2012-11-22 ENCOUNTER — Other Ambulatory Visit: Payer: Self-pay | Admitting: Geriatric Medicine

## 2012-11-22 DIAGNOSIS — F17200 Nicotine dependence, unspecified, uncomplicated: Secondary | ICD-10-CM

## 2012-11-25 ENCOUNTER — Ambulatory Visit
Admission: RE | Admit: 2012-11-25 | Discharge: 2012-11-25 | Disposition: A | Discharge status: Home / Self Care | Payer: No Typology Code available for payment source | Source: Ambulatory Visit | Attending: Geriatric Medicine | Admitting: Geriatric Medicine

## 2012-11-25 DIAGNOSIS — F17200 Nicotine dependence, unspecified, uncomplicated: Secondary | ICD-10-CM

## 2013-11-23 ENCOUNTER — Other Ambulatory Visit: Payer: Self-pay | Admitting: Geriatric Medicine

## 2013-11-23 DIAGNOSIS — J8410 Pulmonary fibrosis, unspecified: Secondary | ICD-10-CM

## 2013-11-30 ENCOUNTER — Encounter: Payer: Self-pay | Admitting: Podiatry

## 2013-11-30 ENCOUNTER — Ambulatory Visit (INDEPENDENT_AMBULATORY_CARE_PROVIDER_SITE_OTHER): Payer: BC Managed Care – PPO | Admitting: Podiatry

## 2013-11-30 ENCOUNTER — Other Ambulatory Visit: Payer: Self-pay

## 2013-11-30 ENCOUNTER — Ambulatory Visit (INDEPENDENT_AMBULATORY_CARE_PROVIDER_SITE_OTHER): Payer: BC Managed Care – PPO

## 2013-11-30 VITALS — Resp 16 | Ht 69.0 in | Wt 238.0 lb

## 2013-11-30 DIAGNOSIS — M7750 Other enthesopathy of unspecified foot: Secondary | ICD-10-CM

## 2013-11-30 DIAGNOSIS — E119 Type 2 diabetes mellitus without complications: Secondary | ICD-10-CM

## 2013-11-30 NOTE — Progress Notes (Signed)
   Subjective:    Patient ID: Phillip Rich, male    DOB: [birth year], 63 y.o.   MRN: [id number]  HPI Comments: Both of my feet are tender. i dont feel anything until i move my toes. Its more of a swelling feeling. Some shoes will bother me. Tennis shoes are good, dress shoes are bad, and sandals are good. i dont do anything for my feet.   Foot Pain      Review of Systems  All other systems reviewed and are negative.      Objective:   Physical Exam        Assessment & Plan:

## 2013-12-01 ENCOUNTER — Ambulatory Visit
Admission: RE | Admit: 2013-12-01 | Discharge: 2013-12-01 | Disposition: A | Discharge status: Home / Self Care | Payer: Medicare Other | Source: Ambulatory Visit | Attending: Geriatric Medicine | Admitting: Geriatric Medicine

## 2013-12-01 DIAGNOSIS — J8410 Pulmonary fibrosis, unspecified: Secondary | ICD-10-CM

## 2013-12-01 NOTE — Progress Notes (Signed)
Subjective:     Patient ID: Phillip Rich, male   DOB: 02-Aug-1950, 63 y.o.   MRN: [id number]  Foot Pain   patient presents that both of his forefeet are tender and admits that his sugar has not been in good control. States that it feels like a wadded sock feeling and that they become thick feeling   Review of Systems  All other systems reviewed and are negative.      Objective:   Physical Exam  Nursing note and vitals reviewed. Constitutional: He is oriented to person, place, and time.  Cardiovascular: Intact distal pulses.   Musculoskeletal: Normal range of motion.  Neurological: He is oriented to person, place, and time.  Skin: Skin is dry.   neurovascular status was found to be intact currently with mild edema in the forefoot of both feet with no structural abnormalities noted. Patient has well-perfused toes and does have mild depression of the arch upon weightbearing but no other indications of issues     Assessment:     Very concerned about high sugar levels and the possibility that this is the beginning of low-grade peripheral neuropathy    Plan:     H&P performed and we discussed this condition at great detail and the importance of getting his sugar under better control and trying to also lose weight and increase exercise. Dispensed over-the-counter insoles with possibility for long-term orthotics depending on response

## 2014-01-18 ENCOUNTER — Other Ambulatory Visit: Payer: Self-pay | Admitting: Gastroenterology

## 2014-03-20 ENCOUNTER — Encounter (HOSPITAL_COMMUNITY): Payer: Self-pay | Admitting: *Deleted

## 2014-04-03 ENCOUNTER — Encounter (HOSPITAL_COMMUNITY)
Admission: RE | Disposition: A | Discharge status: Home / Self Care | Payer: Self-pay | Source: Ambulatory Visit | Attending: Gastroenterology

## 2014-04-03 ENCOUNTER — Ambulatory Visit (HOSPITAL_COMMUNITY): Payer: Medicare Other | Admitting: Anesthesiology

## 2014-04-03 ENCOUNTER — Encounter (HOSPITAL_COMMUNITY): Payer: Self-pay | Admitting: *Deleted

## 2014-04-03 ENCOUNTER — Ambulatory Visit (HOSPITAL_COMMUNITY)
Admission: RE | Admit: 2014-04-03 | Discharge: 2014-04-03 | Disposition: A | Discharge status: Home / Self Care | Payer: Medicare Other | Source: Ambulatory Visit | Attending: Gastroenterology | Admitting: Gastroenterology

## 2014-04-03 DIAGNOSIS — Z1211 Encounter for screening for malignant neoplasm of colon: Secondary | ICD-10-CM | POA: Diagnosis present

## 2014-04-03 DIAGNOSIS — E780 Pure hypercholesterolemia: Secondary | ICD-10-CM | POA: Diagnosis not present

## 2014-04-03 DIAGNOSIS — K219 Gastro-esophageal reflux disease without esophagitis: Secondary | ICD-10-CM | POA: Insufficient documentation

## 2014-04-03 DIAGNOSIS — I10 Essential (primary) hypertension: Secondary | ICD-10-CM | POA: Diagnosis not present

## 2014-04-03 DIAGNOSIS — F17210 Nicotine dependence, cigarettes, uncomplicated: Secondary | ICD-10-CM | POA: Diagnosis not present

## 2014-04-03 DIAGNOSIS — E119 Type 2 diabetes mellitus without complications: Secondary | ICD-10-CM | POA: Insufficient documentation

## 2014-04-03 DIAGNOSIS — M1990 Unspecified osteoarthritis, unspecified site: Secondary | ICD-10-CM | POA: Diagnosis not present

## 2014-04-03 DIAGNOSIS — Z800 Family history of malignant neoplasm of digestive organs: Secondary | ICD-10-CM | POA: Insufficient documentation

## 2014-04-03 DIAGNOSIS — K635 Polyp of colon: Secondary | ICD-10-CM | POA: Diagnosis not present

## 2014-04-03 DIAGNOSIS — K5790 Diverticulosis of intestine, part unspecified, without perforation or abscess without bleeding: Secondary | ICD-10-CM | POA: Insufficient documentation

## 2014-04-03 HISTORY — DX: Adverse effect of unspecified anesthetic, initial encounter: T41.45XA

## 2014-04-03 HISTORY — DX: Other complications of anesthesia, initial encounter: T88.59XA

## 2014-04-03 HISTORY — DX: Other specified postprocedural states: R11.2

## 2014-04-03 HISTORY — DX: Other specified postprocedural states: Z98.890

## 2014-04-03 HISTORY — PX: COLONOSCOPY WITH PROPOFOL: SHX5780

## 2014-04-03 LAB — GLUCOSE, CAPILLARY
Glucose-Capillary: 299 mg/dL — ABNORMAL HIGH (ref 70–99)
Glucose-Capillary: 297 mg/dL — ABNORMAL HIGH (ref 70–99)

## 2014-04-03 SURGERY — COLONOSCOPY WITH PROPOFOL
Anesthesia: Monitor Anesthesia Care

## 2014-04-03 MED ORDER — PROPOFOL 10 MG/ML IV BOLUS
INTRAVENOUS | Status: AC
  Filled 2014-04-03: qty 20

## 2014-04-03 MED ORDER — INSULIN ASPART 100 UNIT/ML ~~LOC~~ SOLN
5.0000 [IU] | Freq: Once | SUBCUTANEOUS | Status: AC
  Administered 2014-04-03: 5 [IU] via SUBCUTANEOUS
  Filled 2014-04-03: qty 0.05

## 2014-04-03 MED ORDER — SODIUM CHLORIDE 0.9 % IV SOLN: INTRAVENOUS | Status: DC

## 2014-04-03 MED ORDER — LACTATED RINGERS IV SOLN
INTRAVENOUS | Status: DC
  Administered 2014-04-03: 1000 mL via INTRAVENOUS

## 2014-04-03 MED ORDER — PROPOFOL 10 MG/ML IV BOLUS
INTRAVENOUS | Status: DC | PRN
  Administered 2014-04-03: 400 mg via INTRAVENOUS

## 2014-04-03 SURGICAL SUPPLY — 22 items

## 2014-04-03 NOTE — Discharge Instructions (Signed)

## 2014-04-03 NOTE — Anesthesia Postprocedure Evaluation (Signed)
  Anesthesia Post-op Note  Patient: Phillip Rich  Procedure(s) Performed: Procedure(s) (LRB): COLONOSCOPY WITH PROPOFOL (N/A)  Patient Location: PACU  Anesthesia Type: MAC  Level of Consciousness: awake and alert   Airway and Oxygen Therapy: Patient Spontanous Breathing  Post-op Pain: mild  Post-op Assessment: Post-op Vital signs reviewed, Patient's Cardiovascular Status Stable, Respiratory Function Stable, Patent Airway and No signs of Nausea or vomiting  Last Vitals:  Filed Vitals:   04/03/14 1008  BP: 136/81  Temp: 36.6 C  Resp: 21    Post-op Vital Signs: stable   Complications: No apparent anesthesia complications

## 2014-04-03 NOTE — H&P (Signed)
  Procedure: Repeat screening colonoscopy. Normal screening colonoscopy performed on 09/30/2007. Sister diagnosed with colon cancer under age 64.  History: The patient is a 63 year old male born in 03/04/1951. He is scheduled to undergo a repeat screening colonoscopy with polypectomy to prevent colon cancer.  Past medical history: Type 2 diabetes mellitus since April 2006. Hypertension. Hypercholesterolemia. Gastroesophageal reflux. Fatty liver seen on CT scan of the chest. Osteoarthritis of the knees. Pulmonary fibrosis by chest CT scan performed in 2012. Pulmonary function testing showed no obstruction in July 2015. Arthroscopic right knee surgery. Scrotal cyst surgery. Sector me. Arthroscopic knee surgery.  Medication allergies: Mycin drugs. Codeine. Crestor. Oxycodone. Victoza. Invokana.  Family history: Sister diagnosed with colon cancer under age 20.  Habits: The patient smokes 1.5 packs of cigarettes per day. 45-pack-year history of cigarette smoking.  Exam: The patient is alert and lying comfortably on the endoscopy stretcher. Abdomen is soft and nontender to palpation. Lungs are clear to auscultation. Cardiac exam reveals a regular rhythm.  Plan: Proceed with screening colonoscopy

## 2014-04-03 NOTE — Anesthesia Preprocedure Evaluation (Signed)
Anesthesia Evaluation  Patient identified by MRN, date of birth, ID band Patient awake    Reviewed: Allergy & Precautions, H&P , NPO status , Patient's Chart, lab work & pertinent test results  Airway Mallampati: II  TM Distance: >3 FB Neck ROM: Full    Dental no notable dental hx.    Pulmonary Current Smoker,  breath sounds clear to auscultation  Pulmonary exam normal       Cardiovascular hypertension, Pt. on medications Rhythm:Regular Rate:Normal     Neuro/Psych negative neurological ROS  negative psych ROS   GI/Hepatic Neg liver ROS, GERD-  Medicated,  Endo/Other  diabetes, Insulin Dependent  Renal/GU negative Renal ROS  negative genitourinary   Musculoskeletal negative musculoskeletal ROS (+)   Abdominal   Peds negative pediatric ROS (+)  Hematology negative hematology ROS (+)   Anesthesia Other Findings   Reproductive/Obstetrics negative OB ROS                             Anesthesia Physical Anesthesia Plan  ASA: III  Anesthesia Plan: MAC   Post-op Pain Management:    Induction: Intravenous  Airway Management Planned: Nasal Cannula  Additional Equipment:   Intra-op Plan:   Post-operative Plan:   Informed Consent: I have reviewed the patients History and Physical, chart, labs and discussed the procedure including the risks, benefits and alternatives for the proposed anesthesia with the patient or authorized representative who has indicated his/her understanding and acceptance.   Dental advisory given  Plan Discussed with: CRNA and Surgeon  Anesthesia Plan Comments:         Anesthesia Quick Evaluation

## 2014-04-03 NOTE — Op Note (Signed)
Procedure: Screening colonoscopy. Normal screening colonoscopy performed on 09/30/2007. Sister diagnosed with colon cancer under age 64  Endoscopist: Earle Gell  Premedication: Propofol administered by anesthesia  Procedure: The patient was placed in the left lateral decubitus position. Anal inspection and digital rectal exam were normal. The Pentax pediatric colonoscope was introduced into the rectum; the cecum was reached at 11:06 AM. A normal-appearing appendiceal orifice was identified. A normal-appearing ileocecal valve was intubated and the terminal ileum inspected. Colonic preparation for the exam today was good after vigorous water lavage of the colon and rectum. The colonoscopy ended at 11:23 AM. Withdrawal time was 17 minutes  Rectum. Normal. Retroflexed view of the distal rectum normal  Sigmoid colon and descending colon. Left colonic diverticulosis  Splenic flexure. Normal  Transverse colon. From the mid transverse colon a 4 mm sessile polyp was removed with the cold biopsy forceps  Hepatic flexure. Normal  Ascending colon. Normal  Cecum and ileocecal valve. Normal  Terminal ileum. Normal  Assessment:  #1. Left colonic diverticulosis  #2. From the mid transverse colon a 4 mm sessile polyp was removed with the cold biopsy forceps  Recommendation: Schedule repeat colonoscopy in 5 years

## 2014-04-04 ENCOUNTER — Encounter (HOSPITAL_COMMUNITY): Payer: Self-pay | Admitting: Gastroenterology

## 2014-04-04 NOTE — Anesthesia Postprocedure Evaluation (Signed)
  Anesthesia Post-op Note  Patient: Phillip Rich  Procedure(s) Performed: Procedure(s) (LRB): COLONOSCOPY WITH PROPOFOL (N/A)  Patient Location: PACU  Anesthesia Type: MAC  Level of Consciousness: awake and alert   Airway and Oxygen Therapy: Patient Spontanous Breathing  Post-op Pain: mild  Post-op Assessment: Post-op Vital signs reviewed, Patient's Cardiovascular Status Stable, Respiratory Function Stable, Patent Airway and No signs of Nausea or vomiting  Last Vitals:  Filed Vitals:   04/03/14 1220  BP:   Pulse: 74  Temp:   Resp: 14    Post-op Vital Signs: stable   Complications: No apparent anesthesia complications

## 2014-11-06 ENCOUNTER — Other Ambulatory Visit: Payer: Self-pay | Admitting: Endodontics

## 2014-11-27 ENCOUNTER — Other Ambulatory Visit: Payer: Self-pay | Admitting: Geriatric Medicine

## 2014-11-27 DIAGNOSIS — J8410 Pulmonary fibrosis, unspecified: Secondary | ICD-10-CM

## 2014-11-29 ENCOUNTER — Ambulatory Visit
Admission: RE | Admit: 2014-11-29 | Discharge: 2014-11-29 | Disposition: A | Discharge status: Home / Self Care | Payer: Medicare Other | Source: Ambulatory Visit | Attending: Geriatric Medicine | Admitting: Geriatric Medicine

## 2014-11-29 DIAGNOSIS — J8410 Pulmonary fibrosis, unspecified: Secondary | ICD-10-CM

## 2015-05-30 DIAGNOSIS — E1142 Type 2 diabetes mellitus with diabetic polyneuropathy: Secondary | ICD-10-CM | POA: Diagnosis not present

## 2015-05-30 DIAGNOSIS — Z7984 Long term (current) use of oral hypoglycemic drugs: Secondary | ICD-10-CM | POA: Diagnosis not present

## 2015-05-30 DIAGNOSIS — I10 Essential (primary) hypertension: Secondary | ICD-10-CM | POA: Diagnosis not present

## 2015-07-03 DIAGNOSIS — E1142 Type 2 diabetes mellitus with diabetic polyneuropathy: Secondary | ICD-10-CM | POA: Diagnosis not present

## 2015-07-03 DIAGNOSIS — E1165 Type 2 diabetes mellitus with hyperglycemia: Secondary | ICD-10-CM | POA: Diagnosis not present

## 2015-07-03 DIAGNOSIS — Z23 Encounter for immunization: Secondary | ICD-10-CM | POA: Diagnosis not present

## 2015-07-03 DIAGNOSIS — Z794 Long term (current) use of insulin: Secondary | ICD-10-CM | POA: Diagnosis not present

## 2015-07-25 DIAGNOSIS — E119 Type 2 diabetes mellitus without complications: Secondary | ICD-10-CM | POA: Diagnosis not present

## 2015-07-25 DIAGNOSIS — H5203 Hypermetropia, bilateral: Secondary | ICD-10-CM | POA: Diagnosis not present

## 2015-07-25 DIAGNOSIS — H2513 Age-related nuclear cataract, bilateral: Secondary | ICD-10-CM | POA: Diagnosis not present

## 2015-08-28 DIAGNOSIS — I10 Essential (primary) hypertension: Secondary | ICD-10-CM | POA: Diagnosis not present

## 2015-08-28 DIAGNOSIS — Z23 Encounter for immunization: Secondary | ICD-10-CM | POA: Diagnosis not present

## 2015-10-12 DIAGNOSIS — E1165 Type 2 diabetes mellitus with hyperglycemia: Secondary | ICD-10-CM | POA: Diagnosis not present

## 2015-10-12 DIAGNOSIS — E1142 Type 2 diabetes mellitus with diabetic polyneuropathy: Secondary | ICD-10-CM | POA: Diagnosis not present

## 2015-10-12 DIAGNOSIS — Z794 Long term (current) use of insulin: Secondary | ICD-10-CM | POA: Diagnosis not present

## 2015-10-26 DIAGNOSIS — J3089 Other allergic rhinitis: Secondary | ICD-10-CM | POA: Diagnosis not present

## 2015-10-26 DIAGNOSIS — R05 Cough: Secondary | ICD-10-CM | POA: Diagnosis not present

## 2015-10-26 DIAGNOSIS — R062 Wheezing: Secondary | ICD-10-CM | POA: Diagnosis not present

## 2015-11-28 ENCOUNTER — Other Ambulatory Visit: Payer: Self-pay | Admitting: Geriatric Medicine

## 2015-11-28 DIAGNOSIS — Z0000 Encounter for general adult medical examination without abnormal findings: Secondary | ICD-10-CM | POA: Diagnosis not present

## 2015-11-28 DIAGNOSIS — J301 Allergic rhinitis due to pollen: Secondary | ICD-10-CM | POA: Diagnosis not present

## 2015-11-28 DIAGNOSIS — K219 Gastro-esophageal reflux disease without esophagitis: Secondary | ICD-10-CM | POA: Diagnosis not present

## 2015-11-28 DIAGNOSIS — Z79899 Other long term (current) drug therapy: Secondary | ICD-10-CM | POA: Diagnosis not present

## 2015-11-28 DIAGNOSIS — K7581 Nonalcoholic steatohepatitis (NASH): Secondary | ICD-10-CM | POA: Diagnosis not present

## 2015-11-28 DIAGNOSIS — J8410 Pulmonary fibrosis, unspecified: Secondary | ICD-10-CM

## 2015-11-28 DIAGNOSIS — E7800 Pure hypercholesterolemia, unspecified: Secondary | ICD-10-CM | POA: Diagnosis not present

## 2015-11-28 DIAGNOSIS — Z794 Long term (current) use of insulin: Secondary | ICD-10-CM | POA: Diagnosis not present

## 2015-11-28 DIAGNOSIS — E1142 Type 2 diabetes mellitus with diabetic polyneuropathy: Secondary | ICD-10-CM | POA: Diagnosis not present

## 2015-11-28 DIAGNOSIS — I10 Essential (primary) hypertension: Secondary | ICD-10-CM | POA: Diagnosis not present

## 2015-12-04 ENCOUNTER — Ambulatory Visit
Admission: RE | Admit: 2015-12-04 | Discharge: 2015-12-04 | Disposition: A | Discharge status: Home / Self Care | Payer: Medicare Other | Source: Ambulatory Visit | Attending: Geriatric Medicine | Admitting: Geriatric Medicine

## 2015-12-04 DIAGNOSIS — J8410 Pulmonary fibrosis, unspecified: Secondary | ICD-10-CM | POA: Diagnosis not present

## 2016-01-15 DIAGNOSIS — E1165 Type 2 diabetes mellitus with hyperglycemia: Secondary | ICD-10-CM | POA: Diagnosis not present

## 2016-01-15 DIAGNOSIS — Z23 Encounter for immunization: Secondary | ICD-10-CM | POA: Diagnosis not present

## 2016-01-15 DIAGNOSIS — Z794 Long term (current) use of insulin: Secondary | ICD-10-CM | POA: Diagnosis not present

## 2016-01-15 DIAGNOSIS — E1142 Type 2 diabetes mellitus with diabetic polyneuropathy: Secondary | ICD-10-CM | POA: Diagnosis not present

## 2016-01-23 DIAGNOSIS — Z79899 Other long term (current) drug therapy: Secondary | ICD-10-CM | POA: Diagnosis not present

## 2016-02-21 DIAGNOSIS — D2371 Other benign neoplasm of skin of right lower limb, including hip: Secondary | ICD-10-CM | POA: Diagnosis not present

## 2016-02-21 DIAGNOSIS — Z85820 Personal history of malignant melanoma of skin: Secondary | ICD-10-CM | POA: Diagnosis not present

## 2016-02-21 DIAGNOSIS — D2372 Other benign neoplasm of skin of left lower limb, including hip: Secondary | ICD-10-CM | POA: Diagnosis not present

## 2016-02-21 DIAGNOSIS — L570 Actinic keratosis: Secondary | ICD-10-CM | POA: Diagnosis not present

## 2016-02-21 DIAGNOSIS — L820 Inflamed seborrheic keratosis: Secondary | ICD-10-CM | POA: Diagnosis not present

## 2016-02-21 DIAGNOSIS — L821 Other seborrheic keratosis: Secondary | ICD-10-CM | POA: Diagnosis not present

## 2016-03-18 DIAGNOSIS — M79671 Pain in right foot: Secondary | ICD-10-CM | POA: Diagnosis not present

## 2016-03-18 DIAGNOSIS — E1142 Type 2 diabetes mellitus with diabetic polyneuropathy: Secondary | ICD-10-CM | POA: Diagnosis not present

## 2016-03-18 DIAGNOSIS — Z794 Long term (current) use of insulin: Secondary | ICD-10-CM | POA: Diagnosis not present

## 2016-03-18 DIAGNOSIS — E1165 Type 2 diabetes mellitus with hyperglycemia: Secondary | ICD-10-CM | POA: Diagnosis not present

## 2016-06-16 DIAGNOSIS — N481 Balanitis: Secondary | ICD-10-CM | POA: Diagnosis not present

## 2016-06-16 DIAGNOSIS — E669 Obesity, unspecified: Secondary | ICD-10-CM | POA: Diagnosis not present

## 2016-06-16 DIAGNOSIS — E1142 Type 2 diabetes mellitus with diabetic polyneuropathy: Secondary | ICD-10-CM | POA: Diagnosis not present

## 2016-06-16 DIAGNOSIS — I700 Atherosclerosis of aorta: Secondary | ICD-10-CM | POA: Diagnosis not present

## 2016-06-16 DIAGNOSIS — Z6832 Body mass index (BMI) 32.0-32.9, adult: Secondary | ICD-10-CM | POA: Diagnosis not present

## 2016-06-16 DIAGNOSIS — Z7984 Long term (current) use of oral hypoglycemic drugs: Secondary | ICD-10-CM | POA: Diagnosis not present

## 2016-06-17 DIAGNOSIS — Z794 Long term (current) use of insulin: Secondary | ICD-10-CM | POA: Diagnosis not present

## 2016-06-17 DIAGNOSIS — E1165 Type 2 diabetes mellitus with hyperglycemia: Secondary | ICD-10-CM | POA: Diagnosis not present

## 2016-06-17 DIAGNOSIS — E1142 Type 2 diabetes mellitus with diabetic polyneuropathy: Secondary | ICD-10-CM | POA: Diagnosis not present

## 2016-07-29 DIAGNOSIS — Z5181 Encounter for therapeutic drug level monitoring: Secondary | ICD-10-CM | POA: Diagnosis not present

## 2016-07-29 DIAGNOSIS — E1142 Type 2 diabetes mellitus with diabetic polyneuropathy: Secondary | ICD-10-CM | POA: Diagnosis not present

## 2016-07-29 DIAGNOSIS — Z794 Long term (current) use of insulin: Secondary | ICD-10-CM | POA: Diagnosis not present

## 2016-07-29 DIAGNOSIS — E1165 Type 2 diabetes mellitus with hyperglycemia: Secondary | ICD-10-CM | POA: Diagnosis not present

## 2016-08-06 DIAGNOSIS — H5203 Hypermetropia, bilateral: Secondary | ICD-10-CM | POA: Diagnosis not present

## 2016-08-06 DIAGNOSIS — E119 Type 2 diabetes mellitus without complications: Secondary | ICD-10-CM | POA: Diagnosis not present

## 2016-08-15 DIAGNOSIS — R350 Frequency of micturition: Secondary | ICD-10-CM | POA: Diagnosis not present

## 2016-08-15 DIAGNOSIS — Z7984 Long term (current) use of oral hypoglycemic drugs: Secondary | ICD-10-CM | POA: Diagnosis not present

## 2016-08-15 DIAGNOSIS — E1142 Type 2 diabetes mellitus with diabetic polyneuropathy: Secondary | ICD-10-CM | POA: Diagnosis not present

## 2016-08-15 DIAGNOSIS — N481 Balanitis: Secondary | ICD-10-CM | POA: Diagnosis not present

## 2016-08-15 DIAGNOSIS — J209 Acute bronchitis, unspecified: Secondary | ICD-10-CM | POA: Diagnosis not present

## 2016-08-15 DIAGNOSIS — J40 Bronchitis, not specified as acute or chronic: Secondary | ICD-10-CM | POA: Diagnosis not present

## 2016-08-15 DIAGNOSIS — Z794 Long term (current) use of insulin: Secondary | ICD-10-CM | POA: Diagnosis not present

## 2016-08-29 DIAGNOSIS — I10 Essential (primary) hypertension: Secondary | ICD-10-CM | POA: Diagnosis not present

## 2016-08-29 DIAGNOSIS — N481 Balanitis: Secondary | ICD-10-CM | POA: Diagnosis not present

## 2016-08-29 DIAGNOSIS — M545 Low back pain: Secondary | ICD-10-CM | POA: Diagnosis not present

## 2016-08-29 DIAGNOSIS — G8929 Other chronic pain: Secondary | ICD-10-CM | POA: Diagnosis not present

## 2016-11-14 DIAGNOSIS — Z5181 Encounter for therapeutic drug level monitoring: Secondary | ICD-10-CM | POA: Diagnosis not present

## 2016-11-14 DIAGNOSIS — E1165 Type 2 diabetes mellitus with hyperglycemia: Secondary | ICD-10-CM | POA: Diagnosis not present

## 2016-11-14 DIAGNOSIS — E1142 Type 2 diabetes mellitus with diabetic polyneuropathy: Secondary | ICD-10-CM | POA: Diagnosis not present

## 2016-11-14 DIAGNOSIS — Z794 Long term (current) use of insulin: Secondary | ICD-10-CM | POA: Diagnosis not present

## 2016-11-27 DIAGNOSIS — J0101 Acute recurrent maxillary sinusitis: Secondary | ICD-10-CM | POA: Diagnosis not present

## 2016-12-01 ENCOUNTER — Other Ambulatory Visit: Payer: Self-pay | Admitting: Geriatric Medicine

## 2016-12-01 DIAGNOSIS — J8410 Pulmonary fibrosis, unspecified: Secondary | ICD-10-CM | POA: Diagnosis not present

## 2016-12-01 DIAGNOSIS — E1142 Type 2 diabetes mellitus with diabetic polyneuropathy: Secondary | ICD-10-CM | POA: Diagnosis not present

## 2016-12-01 DIAGNOSIS — Z79899 Other long term (current) drug therapy: Secondary | ICD-10-CM | POA: Diagnosis not present

## 2016-12-01 DIAGNOSIS — J0101 Acute recurrent maxillary sinusitis: Secondary | ICD-10-CM | POA: Diagnosis not present

## 2016-12-01 DIAGNOSIS — Z1389 Encounter for screening for other disorder: Secondary | ICD-10-CM | POA: Diagnosis not present

## 2016-12-01 DIAGNOSIS — I10 Essential (primary) hypertension: Secondary | ICD-10-CM | POA: Diagnosis not present

## 2016-12-01 DIAGNOSIS — Z0000 Encounter for general adult medical examination without abnormal findings: Secondary | ICD-10-CM | POA: Diagnosis not present

## 2016-12-12 ENCOUNTER — Ambulatory Visit
Admission: RE | Admit: 2016-12-12 | Discharge: 2016-12-12 | Disposition: A | Discharge status: Home / Self Care | Payer: Medicare Other | Source: Ambulatory Visit | Attending: Geriatric Medicine | Admitting: Geriatric Medicine

## 2016-12-12 DIAGNOSIS — J8410 Pulmonary fibrosis, unspecified: Secondary | ICD-10-CM

## 2016-12-12 DIAGNOSIS — R918 Other nonspecific abnormal finding of lung field: Secondary | ICD-10-CM | POA: Diagnosis not present

## 2016-12-16 DIAGNOSIS — Z79899 Other long term (current) drug therapy: Secondary | ICD-10-CM | POA: Diagnosis not present

## 2016-12-16 DIAGNOSIS — Z125 Encounter for screening for malignant neoplasm of prostate: Secondary | ICD-10-CM | POA: Diagnosis not present

## 2016-12-22 DIAGNOSIS — J329 Chronic sinusitis, unspecified: Secondary | ICD-10-CM | POA: Diagnosis not present

## 2016-12-22 DIAGNOSIS — R42 Dizziness and giddiness: Secondary | ICD-10-CM | POA: Diagnosis not present

## 2016-12-22 DIAGNOSIS — H6993 Unspecified Eustachian tube disorder, bilateral: Secondary | ICD-10-CM | POA: Diagnosis not present

## 2017-02-18 DIAGNOSIS — L570 Actinic keratosis: Secondary | ICD-10-CM | POA: Diagnosis not present

## 2017-02-18 DIAGNOSIS — Z85820 Personal history of malignant melanoma of skin: Secondary | ICD-10-CM | POA: Diagnosis not present

## 2017-02-18 DIAGNOSIS — D2371 Other benign neoplasm of skin of right lower limb, including hip: Secondary | ICD-10-CM | POA: Diagnosis not present

## 2017-02-18 DIAGNOSIS — L304 Erythema intertrigo: Secondary | ICD-10-CM | POA: Diagnosis not present

## 2017-02-20 DIAGNOSIS — Z794 Long term (current) use of insulin: Secondary | ICD-10-CM | POA: Diagnosis not present

## 2017-02-20 DIAGNOSIS — E1165 Type 2 diabetes mellitus with hyperglycemia: Secondary | ICD-10-CM | POA: Diagnosis not present

## 2017-02-20 DIAGNOSIS — E1142 Type 2 diabetes mellitus with diabetic polyneuropathy: Secondary | ICD-10-CM | POA: Diagnosis not present

## 2017-02-20 DIAGNOSIS — Z5181 Encounter for therapeutic drug level monitoring: Secondary | ICD-10-CM | POA: Diagnosis not present

## 2017-02-20 DIAGNOSIS — Z23 Encounter for immunization: Secondary | ICD-10-CM | POA: Diagnosis not present

## 2017-02-23 ENCOUNTER — Ambulatory Visit
Admission: RE | Admit: 2017-02-23 | Discharge: 2017-02-23 | Disposition: A | Discharge status: Home / Self Care | Payer: Medicare Other | Source: Ambulatory Visit | Attending: Geriatric Medicine | Admitting: Geriatric Medicine

## 2017-02-23 ENCOUNTER — Other Ambulatory Visit: Payer: Self-pay | Admitting: Geriatric Medicine

## 2017-02-23 DIAGNOSIS — R0981 Nasal congestion: Secondary | ICD-10-CM | POA: Diagnosis not present

## 2017-02-23 DIAGNOSIS — I10 Essential (primary) hypertension: Secondary | ICD-10-CM | POA: Diagnosis not present

## 2017-02-23 DIAGNOSIS — E1142 Type 2 diabetes mellitus with diabetic polyneuropathy: Secondary | ICD-10-CM | POA: Diagnosis not present

## 2017-02-23 DIAGNOSIS — E1140 Type 2 diabetes mellitus with diabetic neuropathy, unspecified: Secondary | ICD-10-CM | POA: Diagnosis not present

## 2017-06-08 DIAGNOSIS — E1142 Type 2 diabetes mellitus with diabetic polyneuropathy: Secondary | ICD-10-CM | POA: Diagnosis not present

## 2017-06-08 DIAGNOSIS — Z7984 Long term (current) use of oral hypoglycemic drugs: Secondary | ICD-10-CM | POA: Diagnosis not present

## 2017-06-08 DIAGNOSIS — J8410 Pulmonary fibrosis, unspecified: Secondary | ICD-10-CM | POA: Diagnosis not present

## 2017-06-08 DIAGNOSIS — N401 Enlarged prostate with lower urinary tract symptoms: Secondary | ICD-10-CM | POA: Diagnosis not present

## 2017-06-08 DIAGNOSIS — Z794 Long term (current) use of insulin: Secondary | ICD-10-CM | POA: Diagnosis not present

## 2017-06-08 DIAGNOSIS — I10 Essential (primary) hypertension: Secondary | ICD-10-CM | POA: Diagnosis not present

## 2017-07-31 DIAGNOSIS — E1165 Type 2 diabetes mellitus with hyperglycemia: Secondary | ICD-10-CM | POA: Diagnosis not present

## 2017-07-31 DIAGNOSIS — Z23 Encounter for immunization: Secondary | ICD-10-CM | POA: Diagnosis not present

## 2017-07-31 DIAGNOSIS — E1122 Type 2 diabetes mellitus with diabetic chronic kidney disease: Secondary | ICD-10-CM | POA: Diagnosis not present

## 2017-07-31 DIAGNOSIS — Z5181 Encounter for therapeutic drug level monitoring: Secondary | ICD-10-CM | POA: Diagnosis not present

## 2017-07-31 DIAGNOSIS — Z794 Long term (current) use of insulin: Secondary | ICD-10-CM | POA: Diagnosis not present

## 2017-07-31 DIAGNOSIS — N181 Chronic kidney disease, stage 1: Secondary | ICD-10-CM | POA: Diagnosis not present

## 2017-07-31 DIAGNOSIS — E1142 Type 2 diabetes mellitus with diabetic polyneuropathy: Secondary | ICD-10-CM | POA: Diagnosis not present

## 2017-08-06 DIAGNOSIS — H5203 Hypermetropia, bilateral: Secondary | ICD-10-CM | POA: Diagnosis not present

## 2017-08-06 DIAGNOSIS — H2513 Age-related nuclear cataract, bilateral: Secondary | ICD-10-CM | POA: Diagnosis not present

## 2017-08-06 DIAGNOSIS — E119 Type 2 diabetes mellitus without complications: Secondary | ICD-10-CM | POA: Diagnosis not present

## 2017-12-02 ENCOUNTER — Other Ambulatory Visit: Payer: Self-pay | Admitting: Geriatric Medicine

## 2017-12-02 DIAGNOSIS — I700 Atherosclerosis of aorta: Secondary | ICD-10-CM | POA: Diagnosis not present

## 2017-12-02 DIAGNOSIS — E7800 Pure hypercholesterolemia, unspecified: Secondary | ICD-10-CM | POA: Diagnosis not present

## 2017-12-02 DIAGNOSIS — Z79899 Other long term (current) drug therapy: Secondary | ICD-10-CM | POA: Diagnosis not present

## 2017-12-02 DIAGNOSIS — Z1389 Encounter for screening for other disorder: Secondary | ICD-10-CM | POA: Diagnosis not present

## 2017-12-02 DIAGNOSIS — J8410 Pulmonary fibrosis, unspecified: Secondary | ICD-10-CM | POA: Diagnosis not present

## 2017-12-02 DIAGNOSIS — Z0000 Encounter for general adult medical examination without abnormal findings: Secondary | ICD-10-CM | POA: Diagnosis not present

## 2017-12-02 DIAGNOSIS — F5101 Primary insomnia: Secondary | ICD-10-CM | POA: Diagnosis not present

## 2017-12-02 DIAGNOSIS — I10 Essential (primary) hypertension: Secondary | ICD-10-CM | POA: Diagnosis not present

## 2017-12-02 DIAGNOSIS — E1142 Type 2 diabetes mellitus with diabetic polyneuropathy: Secondary | ICD-10-CM | POA: Diagnosis not present

## 2017-12-02 DIAGNOSIS — Z125 Encounter for screening for malignant neoplasm of prostate: Secondary | ICD-10-CM | POA: Diagnosis not present

## 2017-12-02 DIAGNOSIS — Z7984 Long term (current) use of oral hypoglycemic drugs: Secondary | ICD-10-CM | POA: Diagnosis not present

## 2017-12-02 DIAGNOSIS — N529 Male erectile dysfunction, unspecified: Secondary | ICD-10-CM | POA: Diagnosis not present

## 2017-12-16 ENCOUNTER — Ambulatory Visit
Admission: RE | Admit: 2017-12-16 | Discharge: 2017-12-16 | Disposition: A | Discharge status: Home / Self Care | Payer: Medicare Other | Source: Ambulatory Visit | Attending: Geriatric Medicine | Admitting: Geriatric Medicine

## 2017-12-16 DIAGNOSIS — J439 Emphysema, unspecified: Secondary | ICD-10-CM | POA: Diagnosis not present

## 2017-12-16 DIAGNOSIS — J8410 Pulmonary fibrosis, unspecified: Secondary | ICD-10-CM

## 2018-01-06 DIAGNOSIS — N5201 Erectile dysfunction due to arterial insufficiency: Secondary | ICD-10-CM | POA: Diagnosis not present

## 2018-01-29 DIAGNOSIS — I10 Essential (primary) hypertension: Secondary | ICD-10-CM | POA: Diagnosis not present

## 2018-01-29 DIAGNOSIS — H9201 Otalgia, right ear: Secondary | ICD-10-CM | POA: Diagnosis not present

## 2018-01-29 DIAGNOSIS — Z23 Encounter for immunization: Secondary | ICD-10-CM | POA: Diagnosis not present

## 2018-01-29 DIAGNOSIS — M5430 Sciatica, unspecified side: Secondary | ICD-10-CM | POA: Diagnosis not present

## 2018-02-08 ENCOUNTER — Other Ambulatory Visit: Payer: Self-pay | Admitting: Geriatric Medicine

## 2018-02-08 DIAGNOSIS — M5441 Lumbago with sciatica, right side: Secondary | ICD-10-CM

## 2018-02-17 ENCOUNTER — Ambulatory Visit
Admission: RE | Admit: 2018-02-17 | Discharge: 2018-02-17 | Disposition: A | Discharge status: Home / Self Care | Payer: Medicare Other | Source: Ambulatory Visit | Attending: Geriatric Medicine | Admitting: Geriatric Medicine

## 2018-02-17 DIAGNOSIS — M5441 Lumbago with sciatica, right side: Secondary | ICD-10-CM

## 2018-02-17 DIAGNOSIS — M48061 Spinal stenosis, lumbar region without neurogenic claudication: Secondary | ICD-10-CM | POA: Diagnosis not present

## 2018-02-18 DIAGNOSIS — Z23 Encounter for immunization: Secondary | ICD-10-CM | POA: Diagnosis not present

## 2018-02-18 DIAGNOSIS — E1165 Type 2 diabetes mellitus with hyperglycemia: Secondary | ICD-10-CM | POA: Diagnosis not present

## 2018-02-18 DIAGNOSIS — E1142 Type 2 diabetes mellitus with diabetic polyneuropathy: Secondary | ICD-10-CM | POA: Diagnosis not present

## 2018-02-18 DIAGNOSIS — Z794 Long term (current) use of insulin: Secondary | ICD-10-CM | POA: Diagnosis not present

## 2018-02-18 DIAGNOSIS — Z5181 Encounter for therapeutic drug level monitoring: Secondary | ICD-10-CM | POA: Diagnosis not present

## 2018-02-18 DIAGNOSIS — E1122 Type 2 diabetes mellitus with diabetic chronic kidney disease: Secondary | ICD-10-CM | POA: Diagnosis not present

## 2018-02-18 DIAGNOSIS — N181 Chronic kidney disease, stage 1: Secondary | ICD-10-CM | POA: Diagnosis not present

## 2018-02-24 DIAGNOSIS — L820 Inflamed seborrheic keratosis: Secondary | ICD-10-CM | POA: Diagnosis not present

## 2018-02-24 DIAGNOSIS — D2371 Other benign neoplasm of skin of right lower limb, including hip: Secondary | ICD-10-CM | POA: Diagnosis not present

## 2018-02-24 DIAGNOSIS — L570 Actinic keratosis: Secondary | ICD-10-CM | POA: Diagnosis not present

## 2018-02-24 DIAGNOSIS — L821 Other seborrheic keratosis: Secondary | ICD-10-CM | POA: Diagnosis not present

## 2018-03-05 DIAGNOSIS — M5417 Radiculopathy, lumbosacral region: Secondary | ICD-10-CM | POA: Diagnosis not present

## 2018-03-05 DIAGNOSIS — M5127 Other intervertebral disc displacement, lumbosacral region: Secondary | ICD-10-CM | POA: Diagnosis not present

## 2018-05-13 DIAGNOSIS — E1142 Type 2 diabetes mellitus with diabetic polyneuropathy: Secondary | ICD-10-CM | POA: Diagnosis not present

## 2018-05-13 DIAGNOSIS — Z794 Long term (current) use of insulin: Secondary | ICD-10-CM | POA: Diagnosis not present

## 2018-05-13 DIAGNOSIS — Z5181 Encounter for therapeutic drug level monitoring: Secondary | ICD-10-CM | POA: Diagnosis not present

## 2018-05-13 DIAGNOSIS — E1122 Type 2 diabetes mellitus with diabetic chronic kidney disease: Secondary | ICD-10-CM | POA: Diagnosis not present

## 2018-05-13 DIAGNOSIS — N181 Chronic kidney disease, stage 1: Secondary | ICD-10-CM | POA: Diagnosis not present

## 2018-05-13 DIAGNOSIS — E1165 Type 2 diabetes mellitus with hyperglycemia: Secondary | ICD-10-CM | POA: Diagnosis not present

## 2018-06-08 DIAGNOSIS — E1140 Type 2 diabetes mellitus with diabetic neuropathy, unspecified: Secondary | ICD-10-CM | POA: Diagnosis not present

## 2018-06-08 DIAGNOSIS — E1142 Type 2 diabetes mellitus with diabetic polyneuropathy: Secondary | ICD-10-CM | POA: Diagnosis not present

## 2018-06-08 DIAGNOSIS — E1165 Type 2 diabetes mellitus with hyperglycemia: Secondary | ICD-10-CM | POA: Diagnosis not present

## 2018-06-08 DIAGNOSIS — I10 Essential (primary) hypertension: Secondary | ICD-10-CM | POA: Diagnosis not present

## 2018-10-12 DIAGNOSIS — H5203 Hypermetropia, bilateral: Secondary | ICD-10-CM | POA: Diagnosis not present

## 2018-10-12 DIAGNOSIS — E119 Type 2 diabetes mellitus without complications: Secondary | ICD-10-CM | POA: Diagnosis not present

## 2018-10-12 DIAGNOSIS — H2513 Age-related nuclear cataract, bilateral: Secondary | ICD-10-CM | POA: Diagnosis not present

## 2018-10-19 DIAGNOSIS — N5201 Erectile dysfunction due to arterial insufficiency: Secondary | ICD-10-CM | POA: Diagnosis not present

## 2018-11-09 DIAGNOSIS — E1165 Type 2 diabetes mellitus with hyperglycemia: Secondary | ICD-10-CM | POA: Diagnosis not present

## 2018-11-09 DIAGNOSIS — N390 Urinary tract infection, site not specified: Secondary | ICD-10-CM | POA: Diagnosis not present

## 2018-11-09 DIAGNOSIS — N5201 Erectile dysfunction due to arterial insufficiency: Secondary | ICD-10-CM | POA: Diagnosis not present

## 2018-11-09 DIAGNOSIS — B952 Enterococcus as the cause of diseases classified elsewhere: Secondary | ICD-10-CM | POA: Diagnosis not present

## 2018-12-20 DIAGNOSIS — N529 Male erectile dysfunction, unspecified: Secondary | ICD-10-CM | POA: Diagnosis not present

## 2018-12-20 DIAGNOSIS — N5203 Combined arterial insufficiency and corporo-venous occlusive erectile dysfunction: Secondary | ICD-10-CM | POA: Diagnosis not present

## 2018-12-22 ENCOUNTER — Other Ambulatory Visit: Payer: Self-pay | Admitting: Geriatric Medicine

## 2018-12-22 DIAGNOSIS — E1140 Type 2 diabetes mellitus with diabetic neuropathy, unspecified: Secondary | ICD-10-CM | POA: Diagnosis not present

## 2018-12-22 DIAGNOSIS — R1310 Dysphagia, unspecified: Secondary | ICD-10-CM

## 2018-12-22 DIAGNOSIS — R1319 Other dysphagia: Secondary | ICD-10-CM

## 2018-12-22 DIAGNOSIS — N529 Male erectile dysfunction, unspecified: Secondary | ICD-10-CM | POA: Diagnosis not present

## 2018-12-22 DIAGNOSIS — J8410 Pulmonary fibrosis, unspecified: Secondary | ICD-10-CM | POA: Diagnosis not present

## 2018-12-22 DIAGNOSIS — K219 Gastro-esophageal reflux disease without esophagitis: Secondary | ICD-10-CM | POA: Diagnosis not present

## 2018-12-22 DIAGNOSIS — I10 Essential (primary) hypertension: Secondary | ICD-10-CM | POA: Diagnosis not present

## 2018-12-27 ENCOUNTER — Ambulatory Visit
Admission: RE | Admit: 2018-12-27 | Discharge: 2018-12-27 | Disposition: A | Discharge status: Home / Self Care | Payer: Medicare Other | Source: Ambulatory Visit | Attending: Geriatric Medicine | Admitting: Geriatric Medicine

## 2018-12-27 DIAGNOSIS — R1310 Dysphagia, unspecified: Secondary | ICD-10-CM

## 2018-12-27 DIAGNOSIS — K449 Diaphragmatic hernia without obstruction or gangrene: Secondary | ICD-10-CM | POA: Diagnosis not present

## 2018-12-27 DIAGNOSIS — R1319 Other dysphagia: Secondary | ICD-10-CM

## 2018-12-27 DIAGNOSIS — K219 Gastro-esophageal reflux disease without esophagitis: Secondary | ICD-10-CM | POA: Diagnosis not present

## 2019-01-07 DIAGNOSIS — Z01812 Encounter for preprocedural laboratory examination: Secondary | ICD-10-CM | POA: Diagnosis not present

## 2019-01-07 DIAGNOSIS — N5203 Combined arterial insufficiency and corporo-venous occlusive erectile dysfunction: Secondary | ICD-10-CM | POA: Diagnosis not present

## 2019-01-07 DIAGNOSIS — Z20828 Contact with and (suspected) exposure to other viral communicable diseases: Secondary | ICD-10-CM | POA: Diagnosis not present

## 2019-01-11 DIAGNOSIS — Z01812 Encounter for preprocedural laboratory examination: Secondary | ICD-10-CM | POA: Diagnosis not present

## 2019-01-11 DIAGNOSIS — N5203 Combined arterial insufficiency and corporo-venous occlusive erectile dysfunction: Secondary | ICD-10-CM | POA: Diagnosis not present

## 2019-01-11 DIAGNOSIS — I498 Other specified cardiac arrhythmias: Secondary | ICD-10-CM | POA: Diagnosis not present

## 2019-01-11 DIAGNOSIS — Z01810 Encounter for preprocedural cardiovascular examination: Secondary | ICD-10-CM | POA: Diagnosis not present

## 2019-01-14 DIAGNOSIS — N5203 Combined arterial insufficiency and corporo-venous occlusive erectile dysfunction: Secondary | ICD-10-CM | POA: Diagnosis not present

## 2019-01-14 DIAGNOSIS — N529 Male erectile dysfunction, unspecified: Secondary | ICD-10-CM | POA: Diagnosis not present

## 2019-01-14 DIAGNOSIS — K644 Residual hemorrhoidal skin tags: Secondary | ICD-10-CM | POA: Diagnosis not present

## 2019-01-28 DIAGNOSIS — N5203 Combined arterial insufficiency and corporo-venous occlusive erectile dysfunction: Secondary | ICD-10-CM | POA: Diagnosis not present

## 2019-03-02 DIAGNOSIS — L821 Other seborrheic keratosis: Secondary | ICD-10-CM | POA: Diagnosis not present

## 2019-03-02 DIAGNOSIS — Z85820 Personal history of malignant melanoma of skin: Secondary | ICD-10-CM | POA: Diagnosis not present

## 2019-03-02 DIAGNOSIS — D1801 Hemangioma of skin and subcutaneous tissue: Secondary | ICD-10-CM | POA: Diagnosis not present

## 2019-03-02 DIAGNOSIS — D2371 Other benign neoplasm of skin of right lower limb, including hip: Secondary | ICD-10-CM | POA: Diagnosis not present

## 2019-03-10 DIAGNOSIS — N390 Urinary tract infection, site not specified: Secondary | ICD-10-CM | POA: Diagnosis not present

## 2019-05-23 ENCOUNTER — Other Ambulatory Visit: Payer: Self-pay | Admitting: Geriatric Medicine

## 2019-05-23 DIAGNOSIS — J8410 Pulmonary fibrosis, unspecified: Secondary | ICD-10-CM

## 2019-05-27 ENCOUNTER — Ambulatory Visit
Admission: RE | Admit: 2019-05-27 | Discharge: 2019-05-27 | Disposition: A | Discharge status: Home / Self Care | Payer: Medicare Other | Source: Ambulatory Visit | Attending: Geriatric Medicine | Admitting: Geriatric Medicine

## 2019-05-27 DIAGNOSIS — J8410 Pulmonary fibrosis, unspecified: Secondary | ICD-10-CM

## 2020-05-12 HISTORY — PX: NEPHRECTOMY: SHX65

## 2020-05-15 DIAGNOSIS — C642 Malignant neoplasm of left kidney, except renal pelvis: Secondary | ICD-10-CM | POA: Diagnosis not present

## 2020-05-15 DIAGNOSIS — E119 Type 2 diabetes mellitus without complications: Secondary | ICD-10-CM | POA: Diagnosis not present

## 2020-05-15 DIAGNOSIS — N2889 Other specified disorders of kidney and ureter: Secondary | ICD-10-CM | POA: Diagnosis not present

## 2020-05-15 DIAGNOSIS — Z794 Long term (current) use of insulin: Secondary | ICD-10-CM | POA: Diagnosis not present

## 2020-05-16 DIAGNOSIS — E119 Type 2 diabetes mellitus without complications: Secondary | ICD-10-CM | POA: Diagnosis not present

## 2020-05-16 DIAGNOSIS — N2889 Other specified disorders of kidney and ureter: Secondary | ICD-10-CM | POA: Diagnosis not present

## 2020-05-16 DIAGNOSIS — Z794 Long term (current) use of insulin: Secondary | ICD-10-CM | POA: Diagnosis not present

## 2020-05-23 DIAGNOSIS — N138 Other obstructive and reflux uropathy: Secondary | ICD-10-CM | POA: Diagnosis not present

## 2020-05-25 DIAGNOSIS — Z1389 Encounter for screening for other disorder: Secondary | ICD-10-CM | POA: Diagnosis not present

## 2020-05-25 DIAGNOSIS — Z79899 Other long term (current) drug therapy: Secondary | ICD-10-CM | POA: Diagnosis not present

## 2020-05-25 DIAGNOSIS — J8410 Pulmonary fibrosis, unspecified: Secondary | ICD-10-CM | POA: Diagnosis not present

## 2020-05-25 DIAGNOSIS — I10 Essential (primary) hypertension: Secondary | ICD-10-CM | POA: Diagnosis not present

## 2020-05-25 DIAGNOSIS — K7581 Nonalcoholic steatohepatitis (NASH): Secondary | ICD-10-CM | POA: Diagnosis not present

## 2020-05-25 DIAGNOSIS — Z794 Long term (current) use of insulin: Secondary | ICD-10-CM | POA: Diagnosis not present

## 2020-05-25 DIAGNOSIS — E1122 Type 2 diabetes mellitus with diabetic chronic kidney disease: Secondary | ICD-10-CM | POA: Diagnosis not present

## 2020-05-25 DIAGNOSIS — Z0000 Encounter for general adult medical examination without abnormal findings: Secondary | ICD-10-CM | POA: Diagnosis not present

## 2020-05-25 DIAGNOSIS — E7800 Pure hypercholesterolemia, unspecified: Secondary | ICD-10-CM | POA: Diagnosis not present

## 2020-05-25 DIAGNOSIS — E1140 Type 2 diabetes mellitus with diabetic neuropathy, unspecified: Secondary | ICD-10-CM | POA: Diagnosis not present

## 2020-05-25 DIAGNOSIS — I700 Atherosclerosis of aorta: Secondary | ICD-10-CM | POA: Diagnosis not present

## 2020-05-25 DIAGNOSIS — F17210 Nicotine dependence, cigarettes, uncomplicated: Secondary | ICD-10-CM | POA: Diagnosis not present

## 2020-06-11 DIAGNOSIS — I10 Essential (primary) hypertension: Secondary | ICD-10-CM | POA: Diagnosis not present

## 2020-06-11 DIAGNOSIS — E1140 Type 2 diabetes mellitus with diabetic neuropathy, unspecified: Secondary | ICD-10-CM | POA: Diagnosis not present

## 2020-06-11 DIAGNOSIS — E1142 Type 2 diabetes mellitus with diabetic polyneuropathy: Secondary | ICD-10-CM | POA: Diagnosis not present

## 2020-06-11 DIAGNOSIS — K219 Gastro-esophageal reflux disease without esophagitis: Secondary | ICD-10-CM | POA: Diagnosis not present

## 2020-06-11 DIAGNOSIS — E7800 Pure hypercholesterolemia, unspecified: Secondary | ICD-10-CM | POA: Diagnosis not present

## 2020-06-11 DIAGNOSIS — E1165 Type 2 diabetes mellitus with hyperglycemia: Secondary | ICD-10-CM | POA: Diagnosis not present

## 2020-07-06 DIAGNOSIS — E1142 Type 2 diabetes mellitus with diabetic polyneuropathy: Secondary | ICD-10-CM | POA: Diagnosis not present

## 2020-07-09 DIAGNOSIS — E1142 Type 2 diabetes mellitus with diabetic polyneuropathy: Secondary | ICD-10-CM | POA: Diagnosis not present

## 2020-08-09 DIAGNOSIS — E1142 Type 2 diabetes mellitus with diabetic polyneuropathy: Secondary | ICD-10-CM | POA: Diagnosis not present

## 2020-08-30 DIAGNOSIS — E7800 Pure hypercholesterolemia, unspecified: Secondary | ICD-10-CM | POA: Diagnosis not present

## 2020-08-30 DIAGNOSIS — K219 Gastro-esophageal reflux disease without esophagitis: Secondary | ICD-10-CM | POA: Diagnosis not present

## 2020-08-30 DIAGNOSIS — I10 Essential (primary) hypertension: Secondary | ICD-10-CM | POA: Diagnosis not present

## 2020-08-30 DIAGNOSIS — E1140 Type 2 diabetes mellitus with diabetic neuropathy, unspecified: Secondary | ICD-10-CM | POA: Diagnosis not present

## 2020-08-30 DIAGNOSIS — E1142 Type 2 diabetes mellitus with diabetic polyneuropathy: Secondary | ICD-10-CM | POA: Diagnosis not present

## 2020-08-30 DIAGNOSIS — E1165 Type 2 diabetes mellitus with hyperglycemia: Secondary | ICD-10-CM | POA: Diagnosis not present

## 2020-09-18 DIAGNOSIS — E1142 Type 2 diabetes mellitus with diabetic polyneuropathy: Secondary | ICD-10-CM | POA: Diagnosis not present

## 2020-09-18 DIAGNOSIS — D692 Other nonthrombocytopenic purpura: Secondary | ICD-10-CM | POA: Diagnosis not present

## 2020-09-18 DIAGNOSIS — E1122 Type 2 diabetes mellitus with diabetic chronic kidney disease: Secondary | ICD-10-CM | POA: Diagnosis not present

## 2020-09-18 DIAGNOSIS — E1165 Type 2 diabetes mellitus with hyperglycemia: Secondary | ICD-10-CM | POA: Diagnosis not present

## 2020-09-18 DIAGNOSIS — N181 Chronic kidney disease, stage 1: Secondary | ICD-10-CM | POA: Diagnosis not present

## 2020-09-18 DIAGNOSIS — Z794 Long term (current) use of insulin: Secondary | ICD-10-CM | POA: Diagnosis not present

## 2020-10-03 DIAGNOSIS — I10 Essential (primary) hypertension: Secondary | ICD-10-CM | POA: Diagnosis not present

## 2020-10-03 DIAGNOSIS — K219 Gastro-esophageal reflux disease without esophagitis: Secondary | ICD-10-CM | POA: Diagnosis not present

## 2020-10-03 DIAGNOSIS — E1122 Type 2 diabetes mellitus with diabetic chronic kidney disease: Secondary | ICD-10-CM | POA: Diagnosis not present

## 2020-10-03 DIAGNOSIS — E1140 Type 2 diabetes mellitus with diabetic neuropathy, unspecified: Secondary | ICD-10-CM | POA: Diagnosis not present

## 2020-10-03 DIAGNOSIS — N181 Chronic kidney disease, stage 1: Secondary | ICD-10-CM | POA: Diagnosis not present

## 2020-10-03 DIAGNOSIS — E1165 Type 2 diabetes mellitus with hyperglycemia: Secondary | ICD-10-CM | POA: Diagnosis not present

## 2020-10-03 DIAGNOSIS — E7800 Pure hypercholesterolemia, unspecified: Secondary | ICD-10-CM | POA: Diagnosis not present

## 2020-10-03 DIAGNOSIS — E1142 Type 2 diabetes mellitus with diabetic polyneuropathy: Secondary | ICD-10-CM | POA: Diagnosis not present

## 2020-10-23 DIAGNOSIS — H2513 Age-related nuclear cataract, bilateral: Secondary | ICD-10-CM | POA: Diagnosis not present

## 2020-10-23 DIAGNOSIS — E119 Type 2 diabetes mellitus without complications: Secondary | ICD-10-CM | POA: Diagnosis not present

## 2020-10-23 DIAGNOSIS — H5203 Hypermetropia, bilateral: Secondary | ICD-10-CM | POA: Diagnosis not present

## 2020-11-07 DIAGNOSIS — M25512 Pain in left shoulder: Secondary | ICD-10-CM | POA: Diagnosis not present

## 2020-11-07 DIAGNOSIS — R29898 Other symptoms and signs involving the musculoskeletal system: Secondary | ICD-10-CM | POA: Diagnosis not present

## 2020-11-08 DIAGNOSIS — E7800 Pure hypercholesterolemia, unspecified: Secondary | ICD-10-CM | POA: Diagnosis not present

## 2020-11-08 DIAGNOSIS — E1142 Type 2 diabetes mellitus with diabetic polyneuropathy: Secondary | ICD-10-CM | POA: Diagnosis not present

## 2020-11-08 DIAGNOSIS — E1165 Type 2 diabetes mellitus with hyperglycemia: Secondary | ICD-10-CM | POA: Diagnosis not present

## 2020-11-08 DIAGNOSIS — I10 Essential (primary) hypertension: Secondary | ICD-10-CM | POA: Diagnosis not present

## 2020-11-08 DIAGNOSIS — K219 Gastro-esophageal reflux disease without esophagitis: Secondary | ICD-10-CM | POA: Diagnosis not present

## 2020-11-08 DIAGNOSIS — E1140 Type 2 diabetes mellitus with diabetic neuropathy, unspecified: Secondary | ICD-10-CM | POA: Diagnosis not present

## 2020-11-08 DIAGNOSIS — N181 Chronic kidney disease, stage 1: Secondary | ICD-10-CM | POA: Diagnosis not present

## 2020-11-08 DIAGNOSIS — E1122 Type 2 diabetes mellitus with diabetic chronic kidney disease: Secondary | ICD-10-CM | POA: Diagnosis not present

## 2020-11-29 DIAGNOSIS — E1142 Type 2 diabetes mellitus with diabetic polyneuropathy: Secondary | ICD-10-CM | POA: Diagnosis not present

## 2020-11-29 DIAGNOSIS — E1165 Type 2 diabetes mellitus with hyperglycemia: Secondary | ICD-10-CM | POA: Diagnosis not present

## 2020-11-29 DIAGNOSIS — E1122 Type 2 diabetes mellitus with diabetic chronic kidney disease: Secondary | ICD-10-CM | POA: Diagnosis not present

## 2020-11-29 DIAGNOSIS — E7800 Pure hypercholesterolemia, unspecified: Secondary | ICD-10-CM | POA: Diagnosis not present

## 2020-11-29 DIAGNOSIS — K219 Gastro-esophageal reflux disease without esophagitis: Secondary | ICD-10-CM | POA: Diagnosis not present

## 2020-11-29 DIAGNOSIS — N181 Chronic kidney disease, stage 1: Secondary | ICD-10-CM | POA: Diagnosis not present

## 2020-11-29 DIAGNOSIS — I10 Essential (primary) hypertension: Secondary | ICD-10-CM | POA: Diagnosis not present

## 2020-12-06 DIAGNOSIS — E1142 Type 2 diabetes mellitus with diabetic polyneuropathy: Secondary | ICD-10-CM | POA: Diagnosis not present

## 2021-02-05 DIAGNOSIS — E1140 Type 2 diabetes mellitus with diabetic neuropathy, unspecified: Secondary | ICD-10-CM | POA: Diagnosis not present

## 2021-02-05 DIAGNOSIS — N181 Chronic kidney disease, stage 1: Secondary | ICD-10-CM | POA: Diagnosis not present

## 2021-02-05 DIAGNOSIS — E1165 Type 2 diabetes mellitus with hyperglycemia: Secondary | ICD-10-CM | POA: Diagnosis not present

## 2021-02-05 DIAGNOSIS — E7800 Pure hypercholesterolemia, unspecified: Secondary | ICD-10-CM | POA: Diagnosis not present

## 2021-02-05 DIAGNOSIS — K219 Gastro-esophageal reflux disease without esophagitis: Secondary | ICD-10-CM | POA: Diagnosis not present

## 2021-02-05 DIAGNOSIS — E1142 Type 2 diabetes mellitus with diabetic polyneuropathy: Secondary | ICD-10-CM | POA: Diagnosis not present

## 2021-02-05 DIAGNOSIS — I10 Essential (primary) hypertension: Secondary | ICD-10-CM | POA: Diagnosis not present

## 2021-02-05 DIAGNOSIS — E1122 Type 2 diabetes mellitus with diabetic chronic kidney disease: Secondary | ICD-10-CM | POA: Diagnosis not present

## 2021-02-21 ENCOUNTER — Other Ambulatory Visit: Payer: Self-pay | Admitting: Geriatric Medicine

## 2021-02-21 DIAGNOSIS — B078 Other viral warts: Secondary | ICD-10-CM | POA: Diagnosis not present

## 2021-02-21 DIAGNOSIS — L821 Other seborrheic keratosis: Secondary | ICD-10-CM | POA: Diagnosis not present

## 2021-02-21 DIAGNOSIS — L820 Inflamed seborrheic keratosis: Secondary | ICD-10-CM | POA: Diagnosis not present

## 2021-02-21 DIAGNOSIS — D2372 Other benign neoplasm of skin of left lower limb, including hip: Secondary | ICD-10-CM | POA: Diagnosis not present

## 2021-02-21 DIAGNOSIS — J8410 Pulmonary fibrosis, unspecified: Secondary | ICD-10-CM

## 2021-02-21 DIAGNOSIS — L570 Actinic keratosis: Secondary | ICD-10-CM | POA: Diagnosis not present

## 2021-02-21 DIAGNOSIS — D2371 Other benign neoplasm of skin of right lower limb, including hip: Secondary | ICD-10-CM | POA: Diagnosis not present

## 2021-03-22 DIAGNOSIS — Z794 Long term (current) use of insulin: Secondary | ICD-10-CM | POA: Diagnosis not present

## 2021-03-22 DIAGNOSIS — E1165 Type 2 diabetes mellitus with hyperglycemia: Secondary | ICD-10-CM | POA: Diagnosis not present

## 2021-03-22 DIAGNOSIS — E1142 Type 2 diabetes mellitus with diabetic polyneuropathy: Secondary | ICD-10-CM | POA: Diagnosis not present

## 2021-03-22 DIAGNOSIS — E1122 Type 2 diabetes mellitus with diabetic chronic kidney disease: Secondary | ICD-10-CM | POA: Diagnosis not present

## 2021-03-22 DIAGNOSIS — N181 Chronic kidney disease, stage 1: Secondary | ICD-10-CM | POA: Diagnosis not present

## 2021-04-05 IMAGING — RF ESOPHAGUS/BARIUM SWALLOW/TABLET STUDY
7 of 8 series · 14 of 24 positions shown · non-contrast
Comparison: 12/16/2017 high-resolution chest CT.

CLINICAL DATA: Worsening esophageal dysphagia with globus sensation
in the neck.

EXAM:
ESOPHOGRAM / BARIUM SWALLOW / BARIUM TABLET STUDY
TECHNIQUE: Combined double contrast and single contrast examination performed
using effervescent crystals, thick barium liquid, and thin barium
liquid. The patient was observed with fluoroscopy swallowing a 13 mm
barium sulphate tablet.
FLUOROSCOPY TIME:  Fluoroscopy Time:  1 minutes 42 seconds
Radiation Exposure Index (if provided by the fluoroscopic device):
108 mGy
Number of Acquired Spot Images: 7

[Series 1: sequence · 2 of 14 frames shown (1 of 6)]
[frame 3/14]
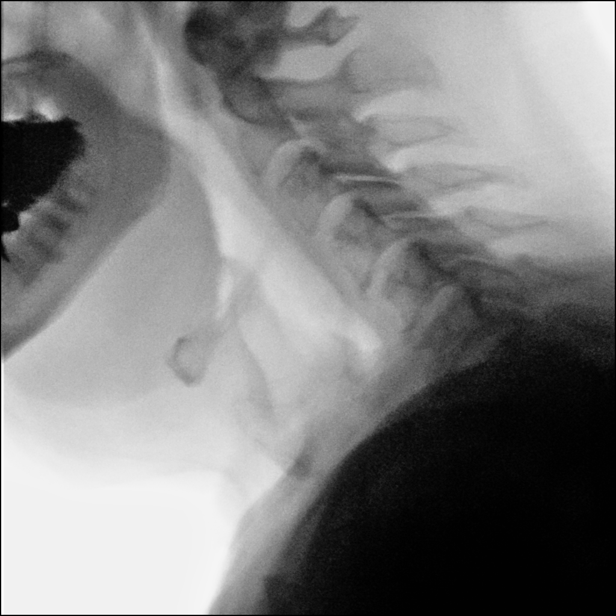
[frame 8/14]
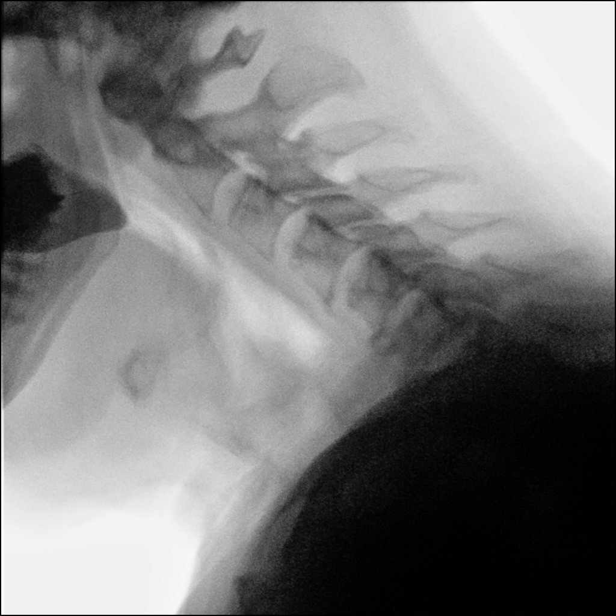

[Series 3: sequence · 2 of 16 frames shown (2 of 6)]
[frame 1/16]
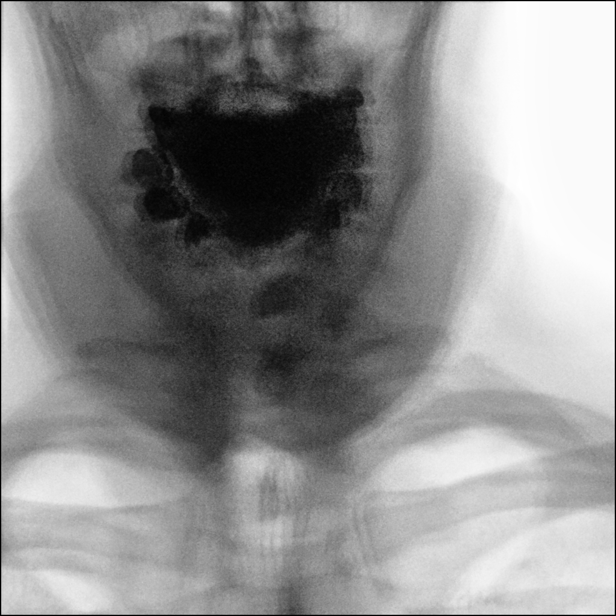
[frame 9/16]
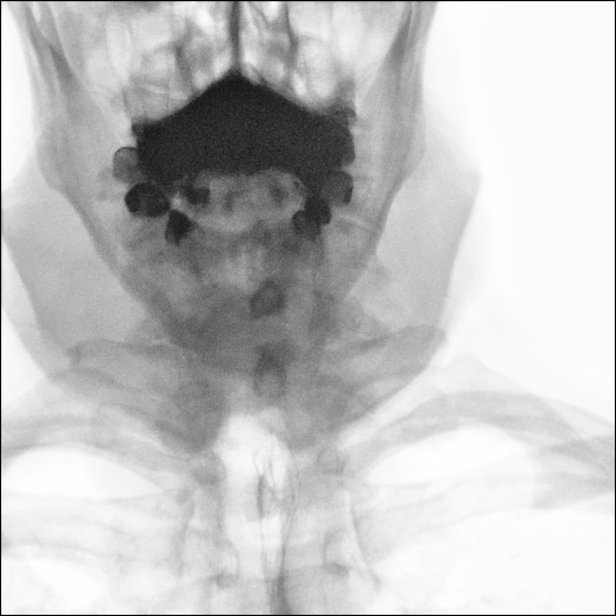

[Series 4: one shot · 0.15mm/px · 3 of 6 slices shown]
[im 1/6]
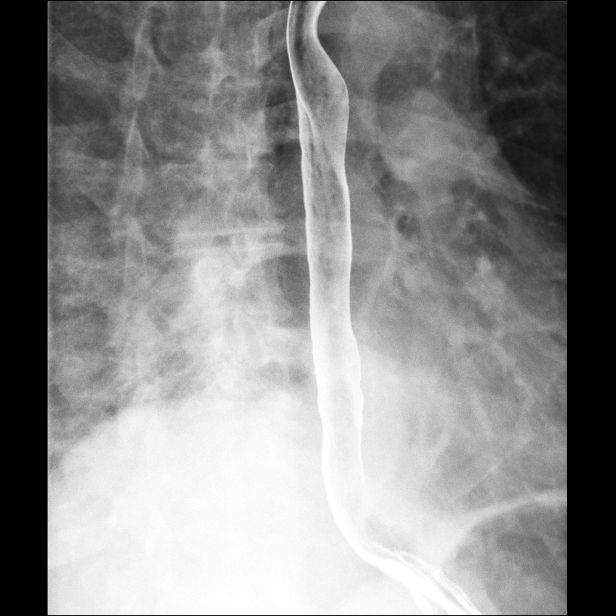
[im 3/6]
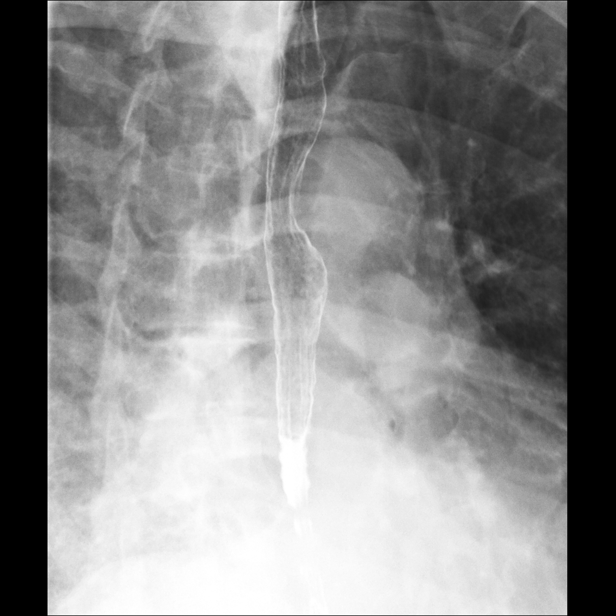
[im 5/6]
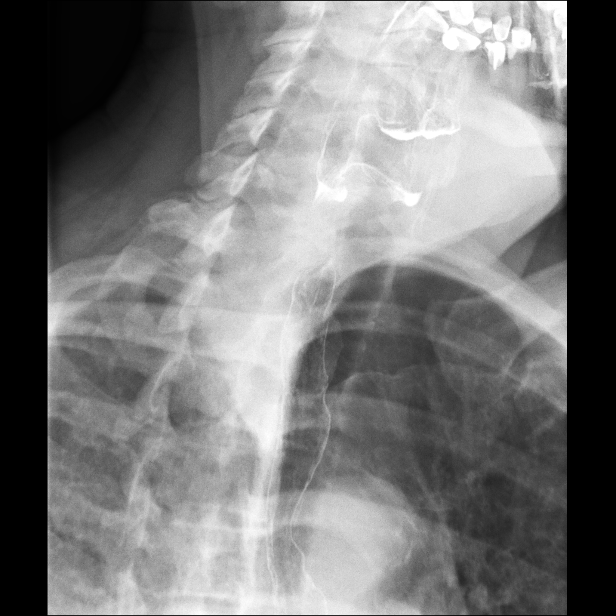

[Series 5: sequence · 2 of 30 frames shown (3 of 6)]
[frame 5/30]
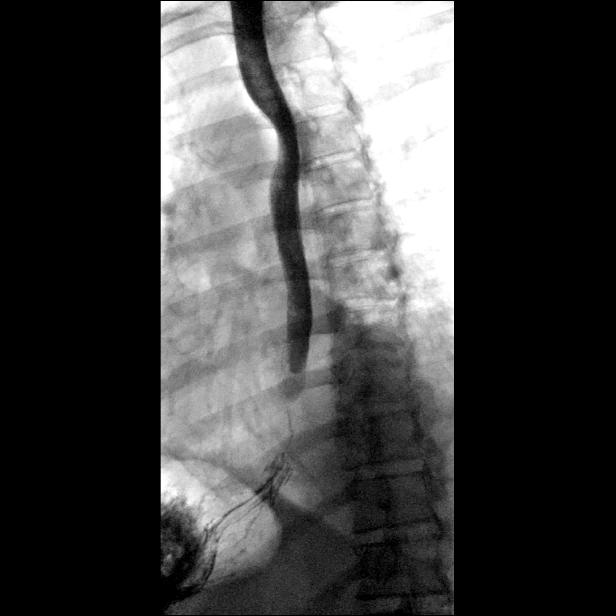
[frame 16/30]
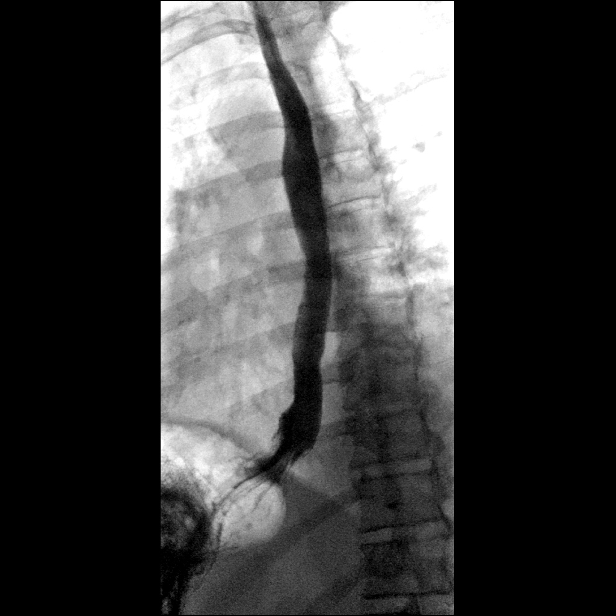

[Series 6: sequence · 2 of 87 frames shown (4 of 6)]
[frame 6/87]
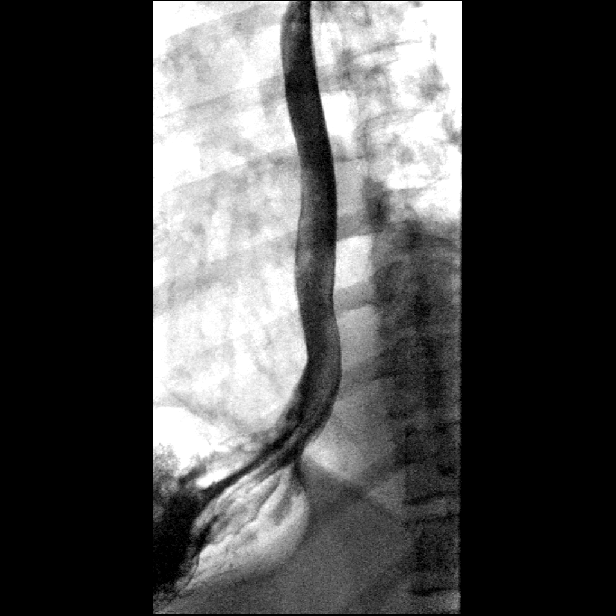
[frame 74/87]
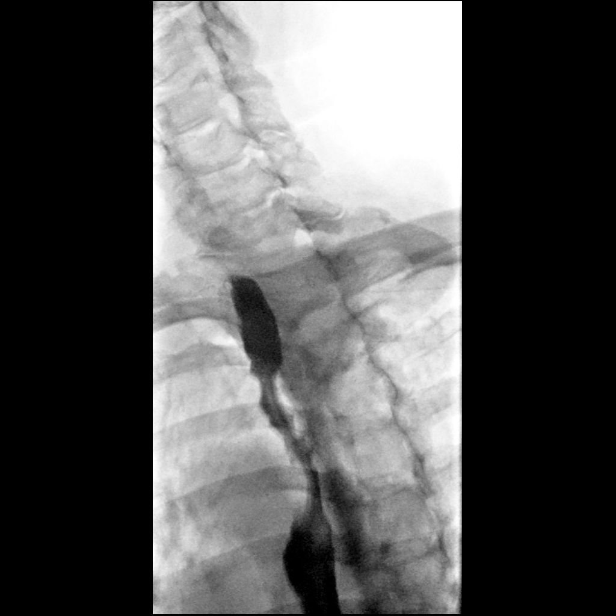

[Series 7: sequence · 1 of 16 frames shown (5 of 6)]
[frame 3/16]
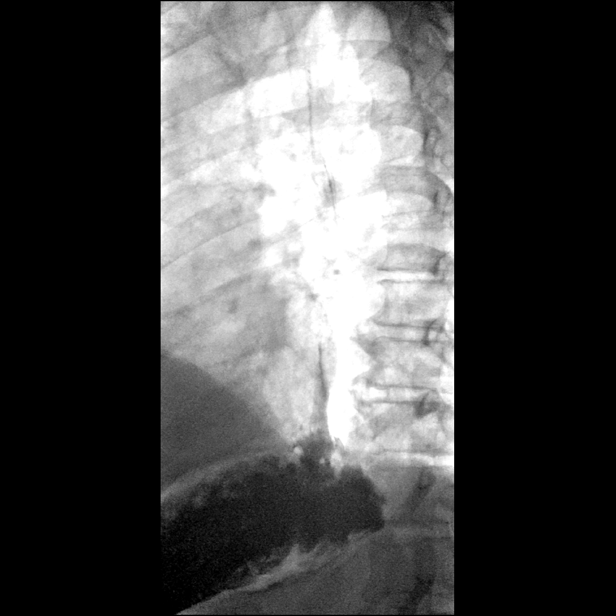

[Series 8: sequence · 2 of 26 frames shown (6 of 6)]
[frame 4/26]
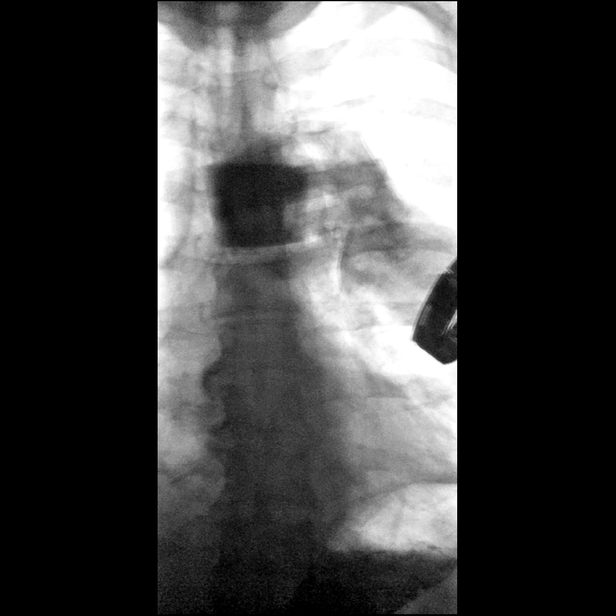
[frame 23/26]
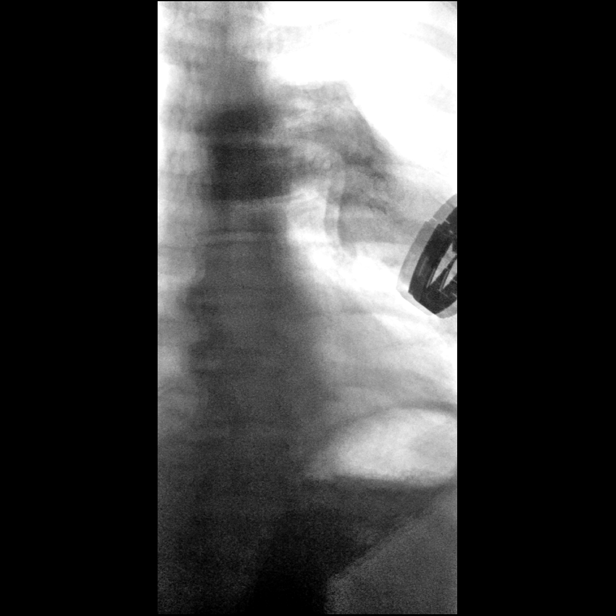

[14 of 24 positions shown; findings below may reference images not displayed]

FINDINGS: Normal oral and pharyngeal phases of swallowing, with no laryngeal
penetration or tracheobronchial aspiration. No significant barium
retention in the pharynx. No evidence of pharyngeal mass, stricture
or diverticulum. No evidence of cricopharyngeus muscle dysfunction.

Mild esophageal dysmotility, characterized by intermittent weakening
of primary peristalsis in the mid to lower thoracic esophagus. Small
hiatal hernia. Mild gastroesophageal reflux elicited to the level of
the midthoracic esophagus with water siphon test. Mild granularity
of the thoracic esophageal mucosa suggesting mild reflux
esophagitis. No evidence of esophageal mass, stricture or ulcer.
Barium tablet traversed the esophagus into the stomach without
delay.
IMPRESSION: 1. Small hiatal hernia.  Mild gastroesophageal reflux elicited.
2. Mild reflux esophagitis, with no evidence of esophageal mass,
stricture or ulcer.
3. Mild esophageal dysmotility, characteristic of chronic reflux
related dysmotility.
4. Normal oral and pharyngeal phases of swallowing.

## 2021-04-19 DIAGNOSIS — E1142 Type 2 diabetes mellitus with diabetic polyneuropathy: Secondary | ICD-10-CM | POA: Diagnosis not present

## 2021-04-19 DIAGNOSIS — E1165 Type 2 diabetes mellitus with hyperglycemia: Secondary | ICD-10-CM | POA: Diagnosis not present

## 2021-04-19 DIAGNOSIS — K219 Gastro-esophageal reflux disease without esophagitis: Secondary | ICD-10-CM | POA: Diagnosis not present

## 2021-04-19 DIAGNOSIS — E7800 Pure hypercholesterolemia, unspecified: Secondary | ICD-10-CM | POA: Diagnosis not present

## 2021-04-19 DIAGNOSIS — E1122 Type 2 diabetes mellitus with diabetic chronic kidney disease: Secondary | ICD-10-CM | POA: Diagnosis not present

## 2021-04-19 DIAGNOSIS — N181 Chronic kidney disease, stage 1: Secondary | ICD-10-CM | POA: Diagnosis not present

## 2021-04-19 DIAGNOSIS — I10 Essential (primary) hypertension: Secondary | ICD-10-CM | POA: Diagnosis not present

## 2021-04-19 DIAGNOSIS — E1140 Type 2 diabetes mellitus with diabetic neuropathy, unspecified: Secondary | ICD-10-CM | POA: Diagnosis not present

## 2021-05-16 DIAGNOSIS — R350 Frequency of micturition: Secondary | ICD-10-CM | POA: Diagnosis not present

## 2021-06-05 DIAGNOSIS — H906 Mixed conductive and sensorineural hearing loss, bilateral: Secondary | ICD-10-CM | POA: Diagnosis not present

## 2021-06-05 DIAGNOSIS — R2689 Other abnormalities of gait and mobility: Secondary | ICD-10-CM | POA: Diagnosis not present

## 2021-06-05 DIAGNOSIS — J310 Chronic rhinitis: Secondary | ICD-10-CM | POA: Diagnosis not present

## 2021-06-05 DIAGNOSIS — H903 Sensorineural hearing loss, bilateral: Secondary | ICD-10-CM | POA: Diagnosis not present

## 2021-06-12 DIAGNOSIS — Z1389 Encounter for screening for other disorder: Secondary | ICD-10-CM | POA: Diagnosis not present

## 2021-06-12 DIAGNOSIS — Z794 Long term (current) use of insulin: Secondary | ICD-10-CM | POA: Diagnosis not present

## 2021-06-12 DIAGNOSIS — E7800 Pure hypercholesterolemia, unspecified: Secondary | ICD-10-CM | POA: Diagnosis not present

## 2021-06-12 DIAGNOSIS — I10 Essential (primary) hypertension: Secondary | ICD-10-CM | POA: Diagnosis not present

## 2021-06-12 DIAGNOSIS — F17210 Nicotine dependence, cigarettes, uncomplicated: Secondary | ICD-10-CM | POA: Diagnosis not present

## 2021-06-12 DIAGNOSIS — K7581 Nonalcoholic steatohepatitis (NASH): Secondary | ICD-10-CM | POA: Diagnosis not present

## 2021-06-12 DIAGNOSIS — Z0000 Encounter for general adult medical examination without abnormal findings: Secondary | ICD-10-CM | POA: Diagnosis not present

## 2021-06-12 DIAGNOSIS — Z79899 Other long term (current) drug therapy: Secondary | ICD-10-CM | POA: Diagnosis not present

## 2021-06-12 DIAGNOSIS — J8410 Pulmonary fibrosis, unspecified: Secondary | ICD-10-CM | POA: Diagnosis not present

## 2021-06-12 DIAGNOSIS — I700 Atherosclerosis of aorta: Secondary | ICD-10-CM | POA: Diagnosis not present

## 2021-06-12 DIAGNOSIS — K219 Gastro-esophageal reflux disease without esophagitis: Secondary | ICD-10-CM | POA: Diagnosis not present

## 2021-06-12 DIAGNOSIS — E1140 Type 2 diabetes mellitus with diabetic neuropathy, unspecified: Secondary | ICD-10-CM | POA: Diagnosis not present

## 2021-07-02 DIAGNOSIS — Z794 Long term (current) use of insulin: Secondary | ICD-10-CM | POA: Diagnosis not present

## 2021-07-02 DIAGNOSIS — N181 Chronic kidney disease, stage 1: Secondary | ICD-10-CM | POA: Diagnosis not present

## 2021-07-02 DIAGNOSIS — E1165 Type 2 diabetes mellitus with hyperglycemia: Secondary | ICD-10-CM | POA: Diagnosis not present

## 2021-07-02 DIAGNOSIS — Z720 Tobacco use: Secondary | ICD-10-CM | POA: Diagnosis not present

## 2021-07-02 DIAGNOSIS — E1142 Type 2 diabetes mellitus with diabetic polyneuropathy: Secondary | ICD-10-CM | POA: Diagnosis not present

## 2021-07-02 DIAGNOSIS — E1122 Type 2 diabetes mellitus with diabetic chronic kidney disease: Secondary | ICD-10-CM | POA: Diagnosis not present

## 2021-07-10 ENCOUNTER — Ambulatory Visit
Admission: RE | Admit: 2021-07-10 | Discharge: 2021-07-10 | Disposition: A | Discharge status: Home / Self Care | Payer: Medicare Other | Source: Ambulatory Visit | Attending: Geriatric Medicine | Admitting: Geriatric Medicine

## 2021-07-10 DIAGNOSIS — K449 Diaphragmatic hernia without obstruction or gangrene: Secondary | ICD-10-CM | POA: Diagnosis not present

## 2021-07-10 DIAGNOSIS — J84112 Idiopathic pulmonary fibrosis: Secondary | ICD-10-CM | POA: Diagnosis not present

## 2021-07-10 DIAGNOSIS — J8410 Pulmonary fibrosis, unspecified: Secondary | ICD-10-CM

## 2021-07-10 DIAGNOSIS — I2510 Atherosclerotic heart disease of native coronary artery without angina pectoris: Secondary | ICD-10-CM | POA: Diagnosis not present

## 2021-07-10 DIAGNOSIS — J432 Centrilobular emphysema: Secondary | ICD-10-CM | POA: Diagnosis not present

## 2021-07-26 DIAGNOSIS — C642 Malignant neoplasm of left kidney, except renal pelvis: Secondary | ICD-10-CM | POA: Diagnosis not present

## 2021-07-26 DIAGNOSIS — N138 Other obstructive and reflux uropathy: Secondary | ICD-10-CM | POA: Diagnosis not present

## 2021-07-26 DIAGNOSIS — R3129 Other microscopic hematuria: Secondary | ICD-10-CM | POA: Diagnosis not present

## 2021-07-29 ENCOUNTER — Encounter: Payer: Self-pay | Admitting: *Deleted

## 2021-07-30 ENCOUNTER — Encounter: Payer: Self-pay | Admitting: Psychiatry

## 2021-07-30 ENCOUNTER — Ambulatory Visit: Payer: Medicare Other | Admitting: Psychiatry

## 2021-07-30 VITALS — BP 143/83 | HR 99 | Ht 70.0 in | Wt 196.0 lb

## 2021-07-30 DIAGNOSIS — G629 Polyneuropathy, unspecified: Secondary | ICD-10-CM

## 2021-07-30 DIAGNOSIS — R2689 Other abnormalities of gait and mobility: Secondary | ICD-10-CM

## 2021-07-30 DIAGNOSIS — R42 Dizziness and giddiness: Secondary | ICD-10-CM

## 2021-07-30 NOTE — Progress Notes (Signed)
? ?[facility] ? ?PATIENT: Phillip Rich ?DOB: Jan 15, 1951 ? ?REFERRING CLINICIAN: Jolene Provost, PA-C ?HISTORY FROM: self ?REASON FOR VISIT: imbalance ? ? ?HISTORICAL ? ?CHIEF COMPLAINT:  ?Chief Complaint  ?Patient presents with  ? Balance issues  ?  Rm 8 New Pt   ? ? ?HISTORY OF PRESENT ILLNESS:  ?The patient presents for evaluation of imbalance and dizziness which began 3 years ago. He describes dizziness as a nauseated feeling which lasts for a few seconds at a time. Denies room spinning or lightheadedness. No associated headaches. Dizziness is triggered by bending forward and turning his head or eyes too quickly. Wellsite geologist or watching basketball games on TV makes him feel dizzy. Closing his eyes in the shower makes him feel unsteady and like he is about to fall over. He has struggled with motion sickness for his entire life. ? ?He saw ENT who did not find a cause for his imbalance. They suspected his symptoms were secondary to a proprioception issue rather than vestibular. ? ? ?OTHER MEDICAL CONDITIONS: smoker, HTN, diabetes c/b diabetic neuropathy, HLD ? ? ?REVIEW OF SYSTEMS: Full 14 system review of systems performed and negative with exception of: imbalance ? ?ALLERGIES: ?Allergies  ?Allergen Reactions  ? Codeine Nausea And Vomiting  ? Crestor [Rosuvastatin Calcium] Other (See Comments)  ?  Leg aches  ? Erythromycin Rash  ?  All Mycin drugs  ? ? ?HOME MEDICATIONS: ?Outpatient Medications Prior to Visit  ?Medication Sig Dispense Refill  ? ALPHA LIPOIC ACID PO Take 1 tablet by mouth every morning.    ? aspirin 81 MG tablet Take 81 mg by mouth at bedtime.     ? cetirizine (ZYRTEC) 10 MG tablet Take 10 mg by mouth every morning.     ? Cholecalciferol (VITAMIN D PO) Take 1 tablet by mouth every morning.    ? DULoxetine (CYMBALTA) 60 MG capsule Take 60 mg by mouth daily.    ? glimepiride (AMARYL) 4 MG tablet Take 1 tablet by mouth Twice daily.    ? insulin NPH-regular Human (NOVOLIN  70/30) (70-30) 100 UNIT/ML injection Inject 50 Units into the skin 2 (two) times daily.     ? losartan (COZAAR) 25 MG tablet Take 1 tablet (25 mg total) by mouth daily. Please HOLD rx  Until patient calls for refill. (Patient taking differently: Take 25 mg by mouth every morning.) 90 tablet 3  ? metFORMIN (GLUCOPHAGE) 1000 MG tablet Take 1,000 mg by mouth 2 (two) times daily.     ? Multiple Vitamin (MULTIVITAMIN WITH MINERALS) TABS tablet Take 1 tablet by mouth every morning.    ? omeprazole (PRILOSEC) 20 MG capsule Take 1 tablet by mouth every morning.     ? pioglitazone (ACTOS) 30 MG tablet Take 30 mg by mouth every morning.     ? promethazine (PHENERGAN) 12.5 MG tablet Take 1 tablet by mouth Every 6 hours as needed for nausea or vomiting.     ? simvastatin (ZOCOR) 20 MG tablet Take 1 tablet by mouth every morning.     ? TRULICITY 6.20 BT/5.9RC SOPN Inject into the skin.    ? ipratropium (ATROVENT) 0.03 % nasal spray Place 2 sprays into the nose 2 (two) times daily. 30 mL 0  ? ?No facility-administered medications prior to visit.  ? ? ?PAST MEDICAL HISTORY: ?Past Medical History:  ?Diagnosis Date  ? Anxiety   ? BPH (benign prostatic hyperplasia)   ? Complication of anesthesia   ? Diabetes mellitus   ?  DJD (degenerative joint disease)   ? GERD (gastroesophageal reflux disease)   ? HTN (hypertension)   ? Hyperlipidemia   ? ILD (interstitial lung disease) ([place])   ? Left renal mass   ? Melanoma ([place])   ? Mild sleep apnea   ? PONV (postoperative nausea and vomiting)   ? ? ?PAST SURGICAL HISTORY: ?Past Surgical History:  ?Procedure Laterality Date  ? COLONOSCOPY WITH PROPOFOL N/A 04/03/2014  ? Procedure: COLONOSCOPY WITH PROPOFOL;  Surgeon: Garlan Fair, MD;  Location: [facility];  Service: Endoscopy;  Laterality: N/A;  ? KNEE ARTHROSCOPY Right   ? x2 right, x5 left  ? LASIK    ? MELANOMA EXCISION Left   ? NEPHRECTOMY Left 2022  ? partial  ? PROSTATE ABLATION    ? TOTAL KNEE ARTHROPLASTY    ? left  ? VASECTOMY     ? ? ?FAMILY HISTORY: ?Family History  ?Problem Relation Age of Onset  ? Lung cancer Mother   ? Heart disease Father   ? Asthma Sister   ? Asthma Sister   ? Colon cancer Sister   ? Rheum arthritis Sister   ?     x3  ? ? ?SOCIAL HISTORY: ?Social History  ? ?Socioeconomic History  ? Marital status: Single  ?  Spouse name: Not on file  ? Number of children: Not on file  ? Years of education: Not on file  ? Highest education level: Not on file  ?Occupational History  ? Occupation: retired  ?Tobacco Use  ? Smoking status: Every Day  ?  Packs/day: 1.00  ?  Years: 50.00  ?  Pack years: 50.00  ?  Types: Cigarettes  ? Smokeless tobacco: Never  ?Substance and Sexual Activity  ? Alcohol use: Yes  ?  Comment: rare occ.  ? Drug use: Yes  ?  Types: Marijuana  ?  Comment: 07/29/21 3 x week to help sleep  ? Sexual activity: Not on file  ?Other Topics Concern  ? Not on file  ?Social History Narrative  ? Not on file  ? ?Social Determinants of Health  ? ?Financial Resource Strain: Not on file  ?Food Insecurity: Not on file  ?Transportation Needs: Not on file  ?Physical Activity: Not on file  ?Stress: Not on file  ?Social Connections: Not on file  ?Intimate Partner Violence: Not on file  ? ? ? ?PHYSICAL EXAM ? ?GENERAL EXAM/CONSTITUTIONAL: ?Vitals:  ?Vitals:  ? 07/30/21 1403  ?BP: (!) 143/83  ?Pulse: 99  ?Weight: 196 lb (88.9 kg)  ?Height: $RemoveB'5\' 10"'XwOQucOz$  (1.778 m)  ? ?Body mass index is 28.12 kg/m?. ?Wt Readings from Last 3 Encounters:  ?07/30/21 196 lb (88.9 kg)  ?04/03/14 240 lb (108.9 kg)  ?11/30/13 238 lb (108 kg)  ? ?Patient is in no distress; well developed, nourished and groomed; neck is supple ? ?CARDIOVASCULAR: ?Examination of peripheral vascular system by observation and palpation is normal ? ? ?MUSCULOSKELETAL: ?Gait, strength, tone, movements noted in Neurologic exam below ? ?NEUROLOGIC: ?MENTAL STATUS:  ?awake, alert, oriented to person, place and time ?recent and remote memory intact ?normal attention and concentration ? ?CRANIAL  NERVE:  ?2nd, 3rd, 4th, 6th - pupils equal and reactive to light, visual fields full to confrontation, extraocular muscles intact, no nystagmus ?5th - facial sensation symmetric ?7th - facial strength symmetric ?8th - hearing intact ?9th - palate elevates symmetrically, uvula midline ?11th - shoulder shrug symmetric ?12th - tongue protrusion midline ? ?MOTOR:  ?normal bulk and tone, full strength  in the BUE, BLE ? ?SENSORY:  ?Diminished sensation to pinprick in a glove and stocking fashion bilateral upper and lower extremities, decreased sensation to vibration and proprioception in bilateral feet ? ?COORDINATION:  ?finger-nose-finger, fine finger movements normal ? ?REFLEXES:  ?deep tendon reflexes present and symmetric ? ?GAIT/STATION:  ?normal ? ?No vertigo or nystagmus elicited on Dix-Hallpike. He does feel dizzy when moving from lying down to sitting up. ? ? ?DIAGNOSTIC DATA (LABS, IMAGING, TESTING) ?- I reviewed patient records, labs, notes, testing and imaging myself where available. ? ?Lab Results  ?Component Value Date  ? WBC 9.6 10/04/2010  ? HGB 10.4 (L) 10/04/2010  ? HCT 30.6 (L) 10/04/2010  ? MCV 91.1 10/04/2010  ? PLT 168 10/04/2010  ? ?   ?Component Value Date/Time  ? NA 133 (L) 10/04/2010 0340  ? K 4.1 10/04/2010 0340  ? CL 99 10/04/2010 0340  ? CO2 26 10/04/2010 0340  ? GLUCOSE 151 (H) 10/04/2010 0340  ? BUN 12 10/04/2010 0340  ? CREATININE 0.75 10/04/2010 0340  ? CALCIUM 9.1 10/04/2010 0340  ? PROT 7.6 09/25/2010 1216  ? ALBUMIN 3.6 09/25/2010 1216  ? AST 54 (H) 09/25/2010 1216  ? ALT 69 (H) 09/25/2010 1216  ? ALKPHOS 98 09/25/2010 1216  ? BILITOT 0.4 09/25/2010 1216  ? GFRNONAA >60 10/04/2010 0340  ? GFRAA  10/04/2010 0340  ?  >60        ?The eGFR has been calculated ?using the MDRD equation. ?This calculation has not been ?validated in all clinical ?situations. ?eGFR's persistently ?<60 mL/min signify ?possible Chronic Kidney Disease.  ? ?05/15/20: A1c 7.4  ? ? ? ?ASSESSMENT AND PLAN ? ?71 y.o. year  old male with a history of smoking, HTN, diabetes c/b diabetic neuropathy, HLD who presents for evaluation of imbalance and dizziness. His exam is significant for decreased proprioception and vibrati

## 2021-07-30 NOTE — Patient Instructions (Signed)
Blood work today to look for causes of neuropathy ?Referral to vestibular therapy ?

## 2021-07-31 ENCOUNTER — Telehealth: Payer: Self-pay | Admitting: Psychiatry

## 2021-07-31 DIAGNOSIS — N138 Other obstructive and reflux uropathy: Secondary | ICD-10-CM | POA: Diagnosis not present

## 2021-07-31 DIAGNOSIS — R3129 Other microscopic hematuria: Secondary | ICD-10-CM | POA: Diagnosis not present

## 2021-07-31 DIAGNOSIS — Z85528 Personal history of other malignant neoplasm of kidney: Secondary | ICD-10-CM | POA: Diagnosis not present

## 2021-07-31 DIAGNOSIS — R319 Hematuria, unspecified: Secondary | ICD-10-CM | POA: Diagnosis not present

## 2021-07-31 DIAGNOSIS — C642 Malignant neoplasm of left kidney, except renal pelvis: Secondary | ICD-10-CM | POA: Diagnosis not present

## 2021-07-31 DIAGNOSIS — Z01818 Encounter for other preprocedural examination: Secondary | ICD-10-CM | POA: Diagnosis not present

## 2021-07-31 NOTE — Telephone Encounter (Signed)
Referral sent to [facility] [phone]. ?

## 2021-08-01 NOTE — Telephone Encounter (Signed)
Referral resent to [facility]. ?

## 2021-08-06 ENCOUNTER — Telehealth: Payer: Self-pay | Admitting: *Deleted

## 2021-08-06 ENCOUNTER — Other Ambulatory Visit: Payer: Self-pay | Admitting: Psychiatry

## 2021-08-06 DIAGNOSIS — D892 Hypergammaglobulinemia, unspecified: Secondary | ICD-10-CM

## 2021-08-06 LAB — MULTIPLE MYELOMA PANEL, SERUM
Albumin SerPl Elph-Mcnc: 3.2 g/dL (ref 2.9–4.4)
Albumin/Glob SerPl: 1 (ref 0.7–1.7)
Alpha 1: 0.2 g/dL (ref 0.0–0.4)
Alpha2 Glob SerPl Elph-Mcnc: 0.9 g/dL (ref 0.4–1.0)
B-Globulin SerPl Elph-Mcnc: 1 g/dL (ref 0.7–1.3)
Gamma Glob SerPl Elph-Mcnc: 1.3 g/dL (ref 0.4–1.8)
Globulin, Total: 3.5 g/dL (ref 2.2–3.9)
IgA/Immunoglobulin A, Serum: 314 mg/dL (ref 61–437)
IgG (Immunoglobin G), Serum: 1147 mg/dL (ref 603–1613)
IgM (Immunoglobulin M), Srm: 297 mg/dL — ABNORMAL HIGH (ref 15–143)
Total Protein: 6.7 g/dL (ref 6.0–8.5)

## 2021-08-06 LAB — VITAMIN B12: Vitamin B-12: 452 pg/mL (ref 232–1245)

## 2021-08-06 NOTE — Telephone Encounter (Signed)
Called patient and informed him Dr Billey Gosling stated his Myeloma panel shows an increase in antibody proteins, which can be seen in liver disease, infections, and autoimmune conditions. she placed an order for him to have blood work done to check for some of these conditions. I advised him of our lab hours.  He has rehab on Thurs, will come then for additional labs. Patient verbalized understanding, appreciation. ? ?

## 2021-08-08 ENCOUNTER — Ambulatory Visit: Payer: Medicare Other | Attending: Psychiatry

## 2021-08-08 ENCOUNTER — Other Ambulatory Visit (INDEPENDENT_AMBULATORY_CARE_PROVIDER_SITE_OTHER): Payer: Self-pay

## 2021-08-08 DIAGNOSIS — R42 Dizziness and giddiness: Secondary | ICD-10-CM | POA: Diagnosis not present

## 2021-08-08 DIAGNOSIS — D892 Hypergammaglobulinemia, unspecified: Secondary | ICD-10-CM

## 2021-08-08 DIAGNOSIS — Z0289 Encounter for other administrative examinations: Secondary | ICD-10-CM

## 2021-08-08 DIAGNOSIS — R2681 Unsteadiness on feet: Secondary | ICD-10-CM | POA: Insufficient documentation

## 2021-08-08 NOTE — Therapy (Signed)
?OUTPATIENT PHYSICAL THERAPY VESTIBULAR EVALUATION ? ? ? ? ?Patient Name: Phillip Rich ?MRN: [id number] ?DOB:01/14/1951, 71 y.o., male ?Today's Date: 08/08/2021 ? ?PCP: Lajean Manes, MD ?REFERRING PROVIDER: Genia Harold, MD ? ? PT End of Session - 08/08/21 1455   ? ? Visit Number 1   ? Number of Visits 13   ? Date for PT Re-Evaluation 10/04/21   pushed out due to delay in scheduling  ? Authorization Type [employer] Medicare   ? Progress Note Due on Visit 10   ? PT Start Time 0093   patient arrived late  ? PT Stop Time 1529   ? PT Time Calculation (min) 37 min   ? Activity Tolerance Other (comment)   Nausea  ? Behavior During Therapy [facility] for tasks assessed/performed   ? ?  ?  ? ?  ? ? ?Past Medical History:  ?Diagnosis Date  ? Anxiety   ? BPH (benign prostatic hyperplasia)   ? Complication of anesthesia   ? Diabetes mellitus   ? DJD (degenerative joint disease)   ? GERD (gastroesophageal reflux disease)   ? HTN (hypertension)   ? Hyperlipidemia   ? ILD (interstitial lung disease) ([place])   ? Left renal mass   ? Melanoma ([place])   ? Mild sleep apnea   ? PONV (postoperative nausea and vomiting)   ? ?Past Surgical History:  ?Procedure Laterality Date  ? COLONOSCOPY WITH PROPOFOL N/A 04/03/2014  ? Procedure: COLONOSCOPY WITH PROPOFOL;  Surgeon: Garlan Fair, MD;  Location: [facility];  Service: Endoscopy;  Laterality: N/A;  ? KNEE ARTHROSCOPY Right   ? x2 right, x5 left  ? LASIK    ? MELANOMA EXCISION Left   ? NEPHRECTOMY Left 2022  ? partial  ? PROSTATE ABLATION    ? TOTAL KNEE ARTHROPLASTY    ? left  ? VASECTOMY    ? ?Patient Active Problem List  ? Diagnosis Date Noted  ? ILD (interstitial lung disease) ([place]) 12/19/2010  ? Tobacco abuse 12/19/2010  ? GERD (gastroesophageal reflux disease) 12/19/2010  ? Cough 10/27/2010  ? Anxiety 10/27/2010  ? SOB (shortness of breath) 10/27/2010  ? Lymph node enlargement 10/27/2010  ? ? ?ONSET DATE: 08/01/21 (Referral Date) ? ?REFERRING DIAG: Dizziness/Vertigo ? ?THERAPY DIAG:   ?Dizziness and giddiness ? ?Unsteadiness on feet ? ?SUBJECTIVE:  ? ?SUBJECTIVE STATEMENT: ?Patient reports that he has had motion sickness his entire life, reports car rides/boat make him sick. Patient also reports he gets dizziness watching racing/basketball on tv. Patient reports he got bifocals but they made him dizziness. Reports that turning his head quickly also makes him dizziness. Balance has been off, reports this is the main issue. Denies hearing changes, but testing reports some mild hearing loss. According to patient, ENT found no findings significant for vestibular impairment.  ?Pt accompanied by: self ? ?PERTINENT HISTORY: Anxiety, DM with Diabetic Neuropathy, DJD, HTN, GERD, Melanoma ? ? ?PAIN:  ?Are you having pain? No ? ?PRECAUTIONS: Fall ? ?WEIGHT BEARING RESTRICTIONS No ? ?FALLS: Has patient fallen in last 6 months? Yes. Number of falls 1 ? ?LIVING ENVIRONMENT: ?Lives with: lives alone;bestfriend live close by ?Lives in: House/apartment ?Stairs: No; Steep Driveway.  ?Has following equipment at home: None ? ?PLOF: Independent with household mobility without device and Independent with community mobility without device ? ?PATIENT GOALS Try improve the balance; Improve the Dizziness ? ?OBJECTIVE:  ? ?DIAGNOSTIC FINDINGS: ENT Findings Normal.  ?  ?SENSATION: ?Light touch: Impaired ; impaired in BLE, reports numbness/tingling ? ?  EDEMA:  ?Reports intermittent swelling in BLE (ankle/foot region) ? ? ?TRANSFERS: ?Assistive device utilized: None  ?Sit to stand: Modified independence ?Stand to sit: Modified independence ? ?GAIT: ?Distance walked: ambulation into therapy session without AD ?Assistive device utilized: None ?Level of assistance: Modified independence ?Comments: no unsteadiness noted with ambulation into session ? ? ?VESTIBULAR ASSESSMENT ?  ? SYMPTOM BEHAVIOR: ?  Subjective history: See Subjective ?  Non-Vestibular symptoms: nausea/vomiting ?  Type of dizziness: Imbalance (Disequilibrium),  Unsteady with head/body turns, and Lightheadedness/Faint ?  Frequency: 3-4x/weekly ?  Duration: minutes ?  Aggravating factors: Induced by motion: looking up at the ceiling, turning body quickly, turning head quickly, and sitting in a moving car and Moving eyes ?  Relieving factors: closing eyes and rest ?  Progression of symptoms: unchanged ? ? OCULOMOTOR EXAM: ?  Ocular Alignment: normal ?  Ocular ROM: No Limitations ?  Spontaneous Nystagmus: absent; reports feels like dizziness could almost come on ?  Gaze-Induced Nystagmus: absent ?  Smooth Pursuits: saccades ?  Saccades: intact; reports mild nausea ? ? ? VESTIBULAR - OCULAR REFLEX:  ?  Slow VOR: Normal ?  VOR Cancellation: Normal ?  Head-Impulse Test: HIT Right: positive ?HIT Left: negative ?  Dynamic Visual Acuity: Static: 5 ?Dynamic: 4; no increase in nausea ?  ? ?OUTCOME MEASURES: ? M-CTSIB: Situation 1: 30 seconds, Situation 2: 20 Seconds, Situation 3: 30 seconds, Situation 4: 4 seconds.  ? ? ? ?PATIENT EDUCATION: ?Education details: Educated on POC/Evaluation Findings ?Person educated: Patient ?Education method: Explanation ?Education comprehension: verbalized understanding ? ? ?GOALS: ?Goals reviewed with patient? Yes ? ?SHORT TERM GOALS: Target date: 09/06/2021 ? ?Pt will be independent with initial HEP for improved balance  ?Baseline: no HEP established ?Goal status: INITIAL ? ?2.  Pt will be able to hold situation 2 of M-CTSIB for >/= 30 seconds to demo improved balance ?Baseline: 20 ?Goal status: INITIAL ? ?3.  FGA TBA and LTG to be set as applicable  ?Baseline: TBA ?Goal status: INITIAL ? ? ?LONG TERM GOALS: Target date: 10/04/2021 ? ?Pt will be independent with final HEP for improved strengthening  ?Baseline: no HEP established ?Goal status: INITIAL ? ?2.  Pt will improve FGA by 4 points from baseline to demonstrate improved balance and reduced fall risk ?Baseline: TBA ?Goal status: INITIAL ? ?3.  Pt will be able to hold situation 4 of M-CTSIB for >/=  15 seconds for improved balance  ?Baseline: 4-5 seconds ?Goal status: INITIAL ? ?4.  Pt will report ability to watch sporting activity on television for >/= 10 minutes without increase in nausea/dizziness ?Baseline: nausea/dizziness with activity ?Goal status: INITIAL ? ? ?ASSESSMENT: ? ?CLINICAL IMPRESSION: ?Patient is a 71 y.o. male referred to Neuro OPPT services for Dizziness/Vertigo. Patient's PMH significant for the following: Anxiety, DM with Diabetic Neuropathy, DJD, HTN, GERD, Melanoma . Upon evaluation, patient presents with the following impairments: dizziness, decreased activity tolerance, increased visual motion sensitivity, positive R HIT, and impaired balance due to peripheral neuropathy leading to increased fall risk. Patient will benefit from skilled PT services to address impairments.  ? ? ? ?OBJECTIVE IMPAIRMENTS decreased activity tolerance, decreased balance, difficulty walking, dizziness, impaired sensation, and pain.  ? ?ACTIVITY LIMITATIONS community activity, driving, and yard work.  ? ?PERSONAL FACTORS Age, Time since onset of injury/illness/exacerbation, and 3+ comorbidities: Anxiety, DM with Diabetic Neuropathy, DJD, HTN, GERD, Melanoma  are also affecting patient's functional outcome.  ? ? ?REHAB POTENTIAL: Good ? ?CLINICAL DECISION MAKING: Stable/uncomplicated ? ?EVALUATION  COMPLEXITY: Low ? ? ?PLAN: ?PT FREQUENCY: 2x/week ? ?PT DURATION: 6 weeks ? ?PLANNED INTERVENTIONS: Therapeutic exercises, Therapeutic activity, Neuromuscular re-education, Balance training, Gait training, Patient/Family education, Joint manipulation, Joint mobilization, Stair training, Vestibular training, Canalith repositioning, Orthotic/Fit training, and DME instructions ? ?PLAN FOR NEXT SESSION: Assess FGA and Update Goals. Initiate HEP focused on VOR, Visual Motion Exercises and Standing Balance ? ? ?Jones Bales, PT, DPT ?08/08/2021, 5:27 PM ? ?

## 2021-08-10 LAB — CBC WITH DIFFERENTIAL/PLATELET
Basophils Absolute: 0.1 10*3/uL (ref 0.0–0.2)
Basos: 1 %
EOS (ABSOLUTE): 0.4 10*3/uL (ref 0.0–0.4)
Eos: 4 %
Hematocrit: 39 % (ref 37.5–51.0)
Hemoglobin: 13.3 g/dL (ref 13.0–17.7)
Immature Grans (Abs): 0 10*3/uL (ref 0.0–0.1)
Immature Granulocytes: 0 %
Lymphocytes Absolute: 2.8 10*3/uL (ref 0.7–3.1)
Lymphs: 28 %
MCH: 29.9 pg (ref 26.6–33.0)
MCHC: 34.1 g/dL (ref 31.5–35.7)
MCV: 88 fL (ref 79–97)
Monocytes Absolute: 0.5 10*3/uL (ref 0.1–0.9)
Monocytes: 5 %
Neutrophils Absolute: 6.5 10*3/uL (ref 1.4–7.0)
Neutrophils: 62 %
Platelets: 260 10*3/uL (ref 150–450)
RBC: 4.45 x10E6/uL (ref 4.14–5.80)
RDW: 13.4 % (ref 11.6–15.4)
WBC: 10.3 10*3/uL (ref 3.4–10.8)

## 2021-08-10 LAB — COMPREHENSIVE METABOLIC PANEL
ALT: 13 IU/L (ref 0–44)
AST: 11 IU/L (ref 0–40)
Albumin/Globulin Ratio: 1.3 (ref 1.2–2.2)
Albumin: 3.8 g/dL (ref 3.7–4.7)
Alkaline Phosphatase: 124 IU/L — ABNORMAL HIGH (ref 44–121)
BUN/Creatinine Ratio: 29 — ABNORMAL HIGH (ref 10–24)
BUN: 26 mg/dL (ref 8–27)
Bilirubin Total: 0.2 mg/dL (ref 0.0–1.2)
CO2: 23 mmol/L (ref 20–29)
Calcium: 9.4 mg/dL (ref 8.6–10.2)
Chloride: 101 mmol/L (ref 96–106)
Creatinine, Ser: 0.91 mg/dL (ref 0.76–1.27)
Globulin, Total: 3 g/dL (ref 1.5–4.5)
Glucose: 212 mg/dL — ABNORMAL HIGH (ref 70–99)
Potassium: 4.5 mmol/L (ref 3.5–5.2)
Sodium: 136 mmol/L (ref 134–144)
Total Protein: 6.8 g/dL (ref 6.0–8.5)
eGFR: 90 mL/min/{1.73_m2} (ref >59)

## 2021-08-10 LAB — ANA+ENA+DNA/DS+SCL 70+SJOSSA/B
ANA Titer 1: NEGATIVE
ENA RNP Ab: 0.4 AI (ref 0.0–0.9)
ENA SM Ab Ser-aCnc: <0.2 AI (ref 0.0–0.9)
ENA SSA (RO) Ab: <0.2 AI (ref 0.0–0.9)
ENA SSB (LA) Ab: <0.2 AI (ref 0.0–0.9)
Scleroderma (Scl-70) (ENA) Antibody, IgG: <0.2 AI (ref 0.0–0.9)
dsDNA Ab: <1 IU/mL (ref 0–9)

## 2021-08-10 LAB — HEPATITIS C ANTIBODY: Hep C Virus Ab: NONREACTIVE

## 2021-08-12 ENCOUNTER — Telehealth: Payer: Self-pay

## 2021-08-12 NOTE — Telephone Encounter (Signed)
-----   Message from Genia Harold, MD sent at 08/12/2021  9:14 AM EDT ----- ?Alkaline phosphatase is slightly elevated. He should reach out to his PCP to see if this needs to be monitored any further or if any more testing should be done ?

## 2021-08-12 NOTE — Telephone Encounter (Signed)
Contacted pt, LVM per DPR, informing him of labs. Advised to call office back with questions.  ?

## 2021-08-13 ENCOUNTER — Ambulatory Visit: Payer: Medicare Other | Attending: Psychiatry | Admitting: Physical Therapy

## 2021-08-13 ENCOUNTER — Encounter: Payer: Self-pay | Admitting: Physical Therapy

## 2021-08-13 DIAGNOSIS — R2681 Unsteadiness on feet: Secondary | ICD-10-CM | POA: Insufficient documentation

## 2021-08-13 DIAGNOSIS — R42 Dizziness and giddiness: Secondary | ICD-10-CM | POA: Insufficient documentation

## 2021-08-13 NOTE — Patient Instructions (Signed)
Gaze Stabilization: Sitting ? ? ? ?Keeping eyes on target on wall a couple feet away, tilt head down 15-30? and move head side to side for __60_ seconds. Repeat while moving head up and down for ___60_ seconds. Perform 3 times each direction.  ? ?Try to slowly incr speed.  ?Do __3__ sessions per day. ? ? ?Copyright ? V[employer]. All rights reserved.  ? ?

## 2021-08-13 NOTE — Therapy (Signed)
? ?OUTPATIENT PHYSICAL THERAPY VESTIBULAR TREATMENT NOTE ? ? ?Patient Name: Phillip Rich ?MRN: [id number] ?DOB:10-Nov-1950, 71 y.o., male ?Today's Date: 08/14/2021 ? ?PCP: Lajean Manes, MD ?REFERRING PROVIDER: Lajean Manes, MD ? ? PT End of Session - 08/13/21 1619   ? ? Visit Number 2   ? Number of Visits 13   ? Date for PT Re-Evaluation 10/04/21   pushed out due to delay in scheduling  ? Authorization Type U[employer] Medicare   ? Progress Note Due on Visit 10   ? PT Start Time [phone]   ? PT Stop Time 1700   ? PT Time Calculation (min) 42 min   ? Activity Tolerance Patient tolerated treatment well   ? Behavior During Therapy [facility] for tasks assessed/performed   ? ?  ?  ? ?  ? ? ?Past Medical History:  ?Diagnosis Date  ? Anxiety   ? BPH (benign prostatic hyperplasia)   ? Complication of anesthesia   ? Diabetes mellitus   ? DJD (degenerative joint disease)   ? GERD (gastroesophageal reflux disease)   ? HTN (hypertension)   ? Hyperlipidemia   ? ILD (interstitial lung disease) ([place])   ? Left renal mass   ? Melanoma ([place])   ? Mild sleep apnea   ? PONV (postoperative nausea and vomiting)   ? ?Past Surgical History:  ?Procedure Laterality Date  ? COLONOSCOPY WITH PROPOFOL N/A 04/03/2014  ? Procedure: COLONOSCOPY WITH PROPOFOL;  Surgeon: Garlan Fair, MD;  Location: [facility];  Service: Endoscopy;  Laterality: N/A;  ? KNEE ARTHROSCOPY Right   ? x2 right, x5 left  ? LASIK    ? MELANOMA EXCISION Left   ? NEPHRECTOMY Left 2022  ? partial  ? PROSTATE ABLATION    ? TOTAL KNEE ARTHROPLASTY    ? left  ? VASECTOMY    ? ?Patient Active Problem List  ? Diagnosis Date Noted  ? ILD (interstitial lung disease) ([place]) 12/19/2010  ? Tobacco abuse 12/19/2010  ? GERD (gastroesophageal reflux disease) 12/19/2010  ? Cough 10/27/2010  ? Anxiety 10/27/2010  ? SOB (shortness of breath) 10/27/2010  ? Lymph node enlargement 10/27/2010  ? ? ?ONSET DATE: 08/01/21 (Referral Date) ? ?REFERRING DIAG: Dizziness/Vertigo ? ?THERAPY DIAG:  ?Dizziness and  giddiness ? ?Unsteadiness on feet ? ?PERTINENT HISTORY: Anxiety, DM with Diabetic Neuropathy, DJD, HTN, GERD, Melanoma ? ?PRECAUTIONS: Fall ? ?SUBJECTIVE: No changes since he was last here. Wants to check if his crystals in his ears are out of place.  ? ?PAIN:  ?Are you having pain? No ? ? ? ?OBJECTIVE: ? ? [facility] PT Assessment - 08/13/21 1622   ? ?  ? Functional Gait  Assessment  ? Gait assessed  Yes   ? Gait Level Surface Walks 20 ft in less than 5.5 sec, no assistive devices, good speed, no evidence for imbalance, normal gait pattern, deviates no more than 6 in outside of the 12 in walkway width.   ? Change in Gait Speed Able to smoothly change walking speed without loss of balance or gait deviation. Deviate no more than 6 in outside of the 12 in walkway width.   ? Gait with Horizontal Head Turns Performs head turns smoothly with no change in gait. Deviates no more than 6 in outside 12 in walkway width   ? Gait with Vertical Head Turns Performs task with slight change in gait velocity (eg, minor disruption to smooth gait path), deviates 6 - 10 in outside 12 in walkway width or  uses assistive device   Felt as if dizziness could start  ? Gait and Pivot Turn Pivot turns safely within 3 sec and stops quickly with no loss of balance.   reporting mild dizziness  ? Step Over Obstacle Is able to step over 2 stacked shoe boxes taped together (9 in total height) without changing gait speed. No evidence of imbalance.   ? Gait with Narrow Base of Support Is able to ambulate for 10 steps heel to toe with no staggering.   ? Gait with Eyes Closed Walks 20 ft, slow speed, abnormal gait pattern, evidence for imbalance, deviates 10-15 in outside 12 in walkway width. Requires more than 9 sec to ambulate 20 ft.   9.2 sec  ? Ambulating Backwards Walks 20 ft, uses assistive device, slower speed, mild gait deviations, deviates 6-10 in outside 12 in walkway width.   14 sec  ? Steps Alternating feet, must use rail.   ? Total Score 25   ?  FGA comment: Low fall risk   ? ?  ?  ? ?  ? ? Vestibular Assessment - 08/13/21 1631   ? ?  ? Positional Testing  ? Dix-Hallpike Dix-Hallpike Right;Dix-Hallpike Left   ? Horizontal Canal Testing Horizontal Canal Right;Horizontal Canal Left   ?  ? Dix-Hallpike Right  ? Dix-Hallpike Right Symptoms No nystagmus   ?  ? Dix-Hallpike Left  ? Dix-Hallpike Left Symptoms No nystagmus   ?  ? Horizontal Canal Right  ? Horizontal Canal Right Symptoms Normal   ?  ? Horizontal Canal Left  ? Horizontal Canal Left Symptoms Normal   ?  ? Positional Sensitivities  ? Nose to Right Knee No dizziness   ? Right Knee to Sitting No dizziness   ? Nose to Left Knee No dizziness   ? Left Knee to Sitting No dizziness   ? Head Turning x 5 No dizziness   ? Head Nodding x 5 No dizziness   ? Pivot Right in Standing Mild dizziness   "feels like a blur"  ? Pivot Left in Standing No dizziness   ? Positional Sensitivities Comments Performed in standing.   ? ?  ?  ? ?  ? ? ? ? ? ? ?VESTIBULAR TREATMENT: ? ?   ?Gaze Adaptation: ?x1 Viewing Horizontal: Position: Seated, Time: 30 sec, 2 x 60 sec and Comment: cues for continuous head motion and slightly incr speed. Pt reporting no dizziness, but just more difficulty w/ turning head to R. ? ?x1 Viewing Vertical: Position: Seated, Time: 30 sec, 2 x 60 sec  and Comment: mild dizziness w/ 60 sec (more so when looking down) ? ? ? ?PATIENT EDUCATION: ?Education details: Results of FGA, pt negative for positional testing for BPPV, VOR x1 exercises for HEP and purpose of performing, answered pt's questions about assessment findings from today and eval visit.  ?Person educated: Patient ?Education method: Explanation ?Education comprehension: verbalized understanding ?  ?  ?GOALS: ?Goals reviewed with patient? Yes ?  ?SHORT TERM GOALS: Target date: 09/06/2021 ?  ?Pt will be independent with initial HEP for improved balance  ?Baseline: no HEP established ?Goal status: INITIAL ?  ?2.  Pt will be able to hold situation 2  of M-CTSIB for >/= 30 seconds to demo improved balance ?Baseline: 20 ?Goal status: INITIAL ?  ?3.  FGA TBA and LTG to be set as applicable     ?Baseline: 25/30 ?Goal status: MET ?  ?  ?LONG TERM GOALS: Target date: 10/04/2021 ?  ?  Pt will be independent with final HEP for improved strengthening  ?Baseline: no HEP established ?Goal status: INITIAL ?  ?2.  Pt will improve FGA by 2 points from baseline to demonstrate improved balance and reduced fall risk ?Baseline: 25/30 ?Goal status: REVISED ?  ?3.  Pt will be able to hold situation 4 of M-CTSIB for >/= 15 seconds for improved balance          ?Baseline: 4-5 seconds ?Goal status: INITIAL ?  ?4.  Pt will report ability to watch sporting activity on television for >/= 10 minutes without increase in nausea/dizziness ?Baseline: nausea/dizziness with activity ?Goal status: INITIAL ?  ?  ?ASSESSMENT: ?  ?CLINICAL IMPRESSION: ?Performed the FGA with pt scoring a 25/30, indicating a low fall risk. LTG updated as appropriate. Performed further MSQ testing in standing to assess for motion sensitivity w/ pt with only mild dizziness when turning to the R. Pt asking to be assessed for BPPV - pt negative for positional testing with roll test and Marye Round and was not symptomatic. Began to initiate pt's HEP for VOR x1. Pt tolerated well, just w/ mild dizziness when looking down in the vertical direction. Will continue per POC.  ? ?  ?  ?OBJECTIVE IMPAIRMENTS decreased activity tolerance, decreased balance, difficulty walking, dizziness, impaired sensation, and pain.  ?  ?ACTIVITY LIMITATIONS community activity, driving, and yard work.  ?  ?PERSONAL FACTORS Age, Time since onset of injury/illness/exacerbation, and 3+ comorbidities: Anxiety, DM with Diabetic Neuropathy, DJD, HTN, GERD, Melanoma  are also affecting patient's functional outcome.  ?  ?  ?REHAB POTENTIAL: Good ?  ?CLINICAL DECISION MAKING: Stable/uncomplicated ?  ?EVALUATION COMPLEXITY: Low ?  ?  ?PLAN: ?PT FREQUENCY:  2x/week ?  ?PT DURATION: 6 weeks ?  ?PLANNED INTERVENTIONS: Therapeutic exercises, Therapeutic activity, Neuromuscular re-education, Balance training, Gait training, Patient/Family education, Joint manipu

## 2021-08-14 DIAGNOSIS — Z79899 Other long term (current) drug therapy: Secondary | ICD-10-CM | POA: Diagnosis not present

## 2021-08-14 DIAGNOSIS — F17210 Nicotine dependence, cigarettes, uncomplicated: Secondary | ICD-10-CM | POA: Diagnosis not present

## 2021-08-14 DIAGNOSIS — J8410 Pulmonary fibrosis, unspecified: Secondary | ICD-10-CM | POA: Diagnosis not present

## 2021-08-14 DIAGNOSIS — I10 Essential (primary) hypertension: Secondary | ICD-10-CM | POA: Diagnosis not present

## 2021-08-15 ENCOUNTER — Encounter: Payer: Self-pay | Admitting: Physical Therapy

## 2021-08-15 ENCOUNTER — Ambulatory Visit: Payer: Medicare Other | Admitting: Physical Therapy

## 2021-08-15 DIAGNOSIS — R42 Dizziness and giddiness: Secondary | ICD-10-CM | POA: Diagnosis not present

## 2021-08-15 DIAGNOSIS — R2681 Unsteadiness on feet: Secondary | ICD-10-CM

## 2021-08-15 NOTE — Therapy (Signed)
? ?OUTPATIENT PHYSICAL THERAPY VESTIBULAR TREATMENT NOTE ? ? ?Patient Name: Phillip Rich ?MRN: [id number] ?DOB:Jul 19, 1950, 71 y.o., male ?Today's Date: 08/15/2021 ? ?PCP: Lajean Manes, MD ?REFERRING PROVIDER: Lajean Manes, MD ? ? PT End of Session - 08/15/21 1659   ? ? Visit Number 3   ? Number of Visits 13   ? Date for PT Re-Evaluation 10/04/21   pushed out due to delay in scheduling  ? Authorization Type UHC Medicare   ? Progress Note Due on Visit 10   ? PT Start Time 1448   ? PT Stop Time 8841   ? PT Time Calculation (min) 44 min   ? Activity Tolerance Patient tolerated treatment well   ? Behavior During Therapy [facility] for tasks assessed/performed   ? ?  ?  ? ?  ? ? ? ?Past Medical History:  ?Diagnosis Date  ? Anxiety   ? BPH (benign prostatic hyperplasia)   ? Complication of anesthesia   ? Diabetes mellitus   ? DJD (degenerative joint disease)   ? GERD (gastroesophageal reflux disease)   ? HTN (hypertension)   ? Hyperlipidemia   ? ILD (interstitial lung disease) ([place])   ? Left renal mass   ? Melanoma ([place])   ? Mild sleep apnea   ? PONV (postoperative nausea and vomiting)   ? ?Past Surgical History:  ?Procedure Laterality Date  ? COLONOSCOPY WITH PROPOFOL N/A 04/03/2014  ? Procedure: COLONOSCOPY WITH PROPOFOL;  Surgeon: Garlan Fair, MD;  [facility];  Service: Endoscopy;  Laterality: N/A;  ? KNEE ARTHROSCOPY Right   ? x2 right, x5 left  ? LASIK    ? MELANOMA EXCISION Left   ? NEPHRECTOMY Left 2022  ? partial  ? PROSTATE ABLATION    ? TOTAL KNEE ARTHROPLASTY    ? left  ? VASECTOMY    ? ?Patient Active Problem List  ? Diagnosis Date Noted  ? ILD (interstitial lung disease) ([place]) 12/19/2010  ? Tobacco abuse 12/19/2010  ? GERD (gastroesophageal reflux disease) 12/19/2010  ? Cough 10/27/2010  ? Anxiety 10/27/2010  ? SOB (shortness of breath) 10/27/2010  ? Lymph node enlargement 10/27/2010  ? ? ?ONSET DATE: 08/01/21 (Referral Date) ? ?REFERRING DIAG: Dizziness/Vertigo ? ?THERAPY DIAG:  ?Dizziness and  giddiness ? ?Unsteadiness on feet ? ?PERTINENT HISTORY: Anxiety, DM with Diabetic Neuropathy, DJD, HTN, GERD, Melanoma ? ?PRECAUTIONS: Fall ? ?SUBJECTIVE: Pt reports he feels nauseous today; feels like he "is on fence and it could go either way" (meaning he could good or he could experience dizziness)  ?Pt reports he has been doing the letter exercise at home in seated position;  pt states he is unable to stand still without weaving ? ?PAIN:  ?Are you having pain? No ? ? ? ?OBJECTIVE:  08-15-21 ?Neuro Re-ed: ? ?Positional testing - Rt sidelying test (-) with no nystagmus:  Lt sidelying test (-) with no nystagmus and no c/o dizziness but c/o "blood rushing up" when he returns to sitting;  Pt transferred sit to supine - no c/o dizziness - reports he feels better lying down ?Pt rolled onto Rt side from supine and reported "a flushing feeling" in his face as if blood rushing to his head; rolled supine to Lt side and reported min dizziness, "as if it could start" ; questionable nystagmus in Lt sidelying so Dix-Hallpike tests were performed for ruling out BPPV - both Rt & Lt Dix-Hallpike tests were (-) with no nystagmus and no c/o vertigo in test position;  pt did report feeling of blood rushing up toward his head with return to upright sitting from both Rt and Lt Dix-Hallpike tests ? ?Pt able to perform seated horizontal head turns with minimal c/o dizziness; vertical head turns with c/o blood rushing to head and mild posterior trunk lean in seated position - pt states he would lose balance posteriorly if he looked up with neck extension in standing ? ?Standing Balance: ?Surface: Floor and Pillows ?Position: Feet Hip Width Apart ?Completed with: Eyes Open and Eyes Closed; Head Turns x 5 Reps and Head Nods x 5 Reps ? ? ?Pt stood with EC on pillows 30 secs with postural sway but no complete LOB ? ? ?Pt performed trunk rotations in corner standing on pillows 5 reps to improve balance with turning with eye/head movement ? ?Pt  stood on pillows - made circles with fingers clasped together clockwise position 5 reps with c/o dizziness ? ? ?VESTIBULAR TREATMENT: ? ?   ?Gaze Adaptation: ?x1 Viewing Horizontal: standing position - 5' away from target - 30 secs with cues to perform with slightly smaller ROM and with increased speed as pt slightly pauses in the middle instead of performing with a continuous motion  ? ?x1 Viewing Vertical: Position: Standing Time: 30 sec - 5' away from target with cues for speed and ROM; pt reported slightly more dizziness with vertical than with horizontal x1 viewing ? ?Blood Pressure readings taken at end of session; RUE seated 145/92 (1st reading);  retaken approx. 3 " later with reading 160/98; LUE 171/102 in seated position (automatic machine used) ?                                                                                      ?PATIENT EDUCATION: ?Education details: Progressed x1 viewing exercise from seated to standing position but instructed pt to have chair in front for safety; kept same time of 30 secs as initially instructed; Discussed and recommended pt do walking program on regular basis for balance and incr. Vestibular input with balance - pt states he is member of [employer] but is currently not going ?Person educated: Patient ?Education method: Explanation ?Education comprehension: verbalized understanding ?  ?  ?GOALS: ?Goals reviewed with patient? Yes ?  ?SHORT TERM GOALS: Target date: 09/06/2021 ?  ?Pt will be independent with initial HEP for improved balance  ?Baseline: no HEP established ?Goal status: INITIAL ?  ?2.  Pt will be able to hold situation 2 of M-CTSIB for >/= 30 seconds to demo improved balance ?Baseline: 20 ?Goal status: INITIAL ?  ?3.  FGA TBA and LTG to be set as applicable     ?Baseline: 25/30 ?Goal status: MET ?  ?  ?LONG TERM GOALS: Target date: 10/04/2021 ?  ?Pt will be independent with final HEP for improved strengthening  ?Baseline: no HEP established ?Goal status:  INITIAL ?  ?2.  Pt will improve FGA by 2 points from baseline to demonstrate improved balance and reduced fall risk ?Baseline: 25/30 ?Goal status: REVISED ?  ?3.  Pt will be able to hold situation 4 of M-CTSIB for >/= 15 seconds for improved balance          ?  Baseline: 4-5 seconds ?Goal status: INITIAL ?  ?4.  Pt will report ability to watch sporting activity on television for >/= 10 minutes without increase in nausea/dizziness ?Baseline: nausea/dizziness with activity ?Goal status: INITIAL ?  ?  ?ASSESSMENT: ?  ?CLINICAL IMPRESSION: ?Pt has visual motion sensitivity as evidenced by postural instability in standing and c/o dizziness and nausea with exercises with eye/head movement.  Tracking ball while standing on pillows provoked dizziness and also minimal dizziness was reported with x1 viewing horizontal and vertical after 30 secs with each direction.  Pt was able to maintain balance on pillows with EC for 30 secs.  Pt did have motion sensitivity with rolling from supine to Lt side but no c/o spinning vertigo.  Pt's c/o "blood rushing to his head" and flush feeling with rolling supine to Rt side may possibly be related to elevated BP in today's session.  Cont with POC. ?  ?  ?OBJECTIVE IMPAIRMENTS decreased activity tolerance, decreased balance, difficulty walking, dizziness, impaired sensation, and pain.  ?  ?ACTIVITY LIMITATIONS community activity, driving, and yard work.  ?  ?PERSONAL FACTORS Age, Time since onset of injury/illness/exacerbation, and 3+ comorbidities: Anxiety, DM with Diabetic Neuropathy, DJD, HTN, GERD, Melanoma  are also affecting patient's functional outcome.  ?  ?  ?REHAB POTENTIAL: Good ?  ?CLINICAL DECISION MAKING: Stable/uncomplicated ?  ?EVALUATION COMPLEXITY: Low ?  ?  ?PLAN: ?PT FREQUENCY: 2x/week ?  ?PT DURATION: 6 weeks ?  ?PLANNED INTERVENTIONS: Therapeutic exercises, Therapeutic activity, Neuromuscular re-education, Balance training, Gait training, Patient/Family education, Joint  manipulation, Joint mobilization, Stair training, Vestibular training, Canalith repositioning, Orthotic/Fit training, and DME instructions ?  ?PLAN FOR NEXT SESSION: Check to see how pt did with standing x

## 2021-08-20 ENCOUNTER — Ambulatory Visit: Payer: Medicare Other | Admitting: Physical Therapy

## 2021-08-20 VITALS — BP 161/103 | HR 85

## 2021-08-20 DIAGNOSIS — R2681 Unsteadiness on feet: Secondary | ICD-10-CM

## 2021-08-20 DIAGNOSIS — R42 Dizziness and giddiness: Secondary | ICD-10-CM

## 2021-08-20 NOTE — Therapy (Addendum)
? ?OUTPATIENT PHYSICAL THERAPY VESTIBULAR TREATMENT NOTE ? ? ?Patient Name: Phillip Rich ?MRN: [id number] ?DOB:[birth year], 71 y.o., male ?Today's Date: 08/20/2021 ? ?PCP: Lajean Manes, MD ?REFERRING PROVIDER: Lajean Manes, MD ? ? PT End of Session - 08/20/21 1617   ? ? Visit Number 4   ? Number of Visits 13   ? Date for PT Re-Evaluation 10/04/21   pushed out due to delay in scheduling  ? Authorization Type UHC Medicare   ? Progress Note Due on Visit 10   ? PT Start Time 1616   ? PT Stop Time 2229   full time not used due to elevated BP  ? PT Time Calculation (min) 34 min   ? Activity Tolerance --   ? Behavior During Therapy [facility] for tasks assessed/performed   ? ?  ?  ? ?  ? ? ? ? ?Past Medical History:  ?Diagnosis Date  ? Anxiety   ? BPH (benign prostatic hyperplasia)   ? Complication of anesthesia   ? Diabetes mellitus   ? DJD (degenerative joint disease)   ? GERD (gastroesophageal reflux disease)   ? HTN (hypertension)   ? Hyperlipidemia   ? ILD (interstitial lung disease) ([place])   ? Left renal mass   ? Melanoma ([place])   ? Mild sleep apnea   ? PONV (postoperative nausea and vomiting)   ? ?Past Surgical History:  ?Procedure Laterality Date  ? COLONOSCOPY WITH PROPOFOL N/A 04/03/2014  ? Procedure: COLONOSCOPY WITH PROPOFOL;  Surgeon: Garlan Fair, MD;  [facility];  Service: Endoscopy;  Laterality: N/A;  ? KNEE ARTHROSCOPY Right   ? x2 right, x5 left  ? LASIK    ? MELANOMA EXCISION Left   ? NEPHRECTOMY Left 2022  ? partial  ? PROSTATE ABLATION    ? TOTAL KNEE ARTHROPLASTY    ? left  ? VASECTOMY    ? ?Patient Active Problem List  ? Diagnosis Date Noted  ? ILD (interstitial lung disease) ([place]) 12/19/2010  ? Tobacco abuse 12/19/2010  ? GERD (gastroesophageal reflux disease) 12/19/2010  ? Cough 10/27/2010  ? Anxiety 10/27/2010  ? SOB (shortness of breath) 10/27/2010  ? Lymph node enlargement 10/27/2010  ? ? ?ONSET DATE: 08/01/21 (Referral Date) ? ?REFERRING DIAG: Dizziness/Vertigo ? ?THERAPY DIAG:   ?Dizziness and giddiness ? ?Unsteadiness on feet ? ?PERTINENT HISTORY: Anxiety, DM with Diabetic Neuropathy, DJD, HTN, GERD, Melanoma ? ?PRECAUTIONS: Fall ? ?SUBJECTIVE: Asking if the VOR exercise needs to be a continuous or if he needs to stop in the middle.  ? ?PAIN:  ?Are you having pain? No ? ? ?Today's Vitals  ? 08/20/21 1622 08/20/21 1637 08/20/21 1643 08/20/21 1646  ?BP: (!) 172/95 (!) 185/95 (!) 170/92 (!) 161/103  ?Pulse: 92 87 86 85  ? ?Last BP value taken in RUE. All taken in seated.  ? ? ? ?VESTIBULAR TREATMENT: ? ?   ?Access Code: [email or web address] ?URL: [email or web address] ?Date: 08/20/2021 ?Prepared by: Janann August ? ?Exercises ?- Romberg Stance Eyes Closed on Foam Pad  - 1-2 x daily - 5 x weekly - 3 sets - 30 hold ?- Corner Balance Feet Apart: Eyes Closed With Head Turns  - 1-2 x daily - 5 x weekly - 2 sets - 10 reps - performed on 2 pillows, also head nods ? ?Initiated balance HEP for incr vestibular input (only if pt's BP is under control) ? ?Self-Care: ?Patient with elevated BP in sitting (WFL to participate in therapy, but still elevated and even more  so after standing activity). Pt reports his BP was elevated with PCP w/ Dr. Felipa Eth last week and has been asked to keep a BP log. Noted BP elevated also at end of last PT session. Educated on BP limitations to therapy and importance of taking BP meds daily (pt reports he takes it 1x a day at night and has not yet taken it today). Educated on following up and calling his PCP tomorrow regarding continued elevated BP values to see if any meds need to be added. Discussed his elevated BP could be related to the feelings of "blood rushing to his head". Educated to continue to monitor BP at home and if it is elevated and pt has any signs/sx of a CVA, would need to go to ER immediately and making sure that he limits his salt intake as that can also incr BP. Pt verbalized understanding of instructions/education. Wrote down BP readings from  today for pt to add to his BP log.  ? ?                                                ?PATIENT EDUCATION: ?Education details: See Self-Care section above  ?Person educated: Patient ?Education method: Explanation ?Education comprehension: verbalized understanding ? ? ?HEP: ?   ?Access Code: [email or web address] ?URL: [email or web address] ?Date: 08/20/2021 ?Prepared by: Janann August ? ?Exercises ?- Romberg Stance Eyes Closed on Foam Pad  - 1-2 x daily - 5 x weekly - 3 sets - 30 hold ?- Corner Balance Feet Apart: Eyes Closed With Head Turns  - 1-2 x daily - 5 x weekly - 2 sets - 10 reps - performed on 2 pillows, also head nods ? ?Initiated balance HEP for incr vestibular input (only if pt's BP is under control) ? ?Standing VOR x1 for 30 seconds, horizontal/vertical direction ? ?  ?GOALS: ?Goals reviewed with patient? Yes ?  ?SHORT TERM GOALS: Target date: 09/06/2021 ?  ?Pt will be independent with initial HEP for improved balance  ?Baseline: no HEP established ?Goal status: INITIAL ?  ?2.  Pt will be able to hold situation 2 of M-CTSIB for >/= 30 seconds to demo improved balance ?Baseline: 20 ?Goal status: INITIAL ?  ?3.  FGA TBA and LTG to be set as applicable     ?Baseline: 25/30 ?Goal status: MET ?  ?  ?LONG TERM GOALS: Target date: 10/04/2021 ?  ?Pt will be independent with final HEP for improved strengthening  ?Baseline: no HEP established ?Goal status: INITIAL ?  ?2.  Pt will improve FGA by 2 points from baseline to demonstrate improved balance and reduced fall risk ?Baseline: 25/30 ?Goal status: REVISED ?  ?3.  Pt will be able to hold situation 4 of M-CTSIB for >/= 15 seconds for improved balance          ?Baseline: 4-5 seconds ?Goal status: INITIAL ?  ?4.  Pt will report ability to watch sporting activity on television for >/= 10 minutes without increase in nausea/dizziness ?Baseline: nausea/dizziness with activity ?Goal status: INITIAL ?  ?  ?ASSESSMENT: ?  ?CLINICAL IMPRESSION: ?Pt with elevated BP  during today's session, but was asymptomatic throughout. Pt's BP did decr with a seated rest break after standing activity (see vitals above for more information). Limited session today, but was able to add 2 exercises for incr vestibular input for balance to HEP  for pt to perform when BP is under control. See self-care section above for education. Pt to follow up with his PCP tomorrow for elevated BP readings. Will continue to progress towards LTGs.  ? ?  ?  ?OBJECTIVE IMPAIRMENTS decreased activity tolerance, decreased balance, difficulty walking, dizziness, impaired sensation, and pain.  ?  ?ACTIVITY LIMITATIONS community activity, driving, and yard work.  ?  ?PERSONAL FACTORS Age, Time since onset of injury/illness/exacerbation, and 3+ comorbidities: Anxiety, DM with Diabetic Neuropathy, DJD, HTN, GERD, Melanoma  are also affecting patient's functional outcome.  ?  ?  ?REHAB POTENTIAL: Good ?  ?CLINICAL DECISION MAKING: Stable/uncomplicated ?  ?EVALUATION COMPLEXITY: Low ?  ?  ?PLAN: ?PT FREQUENCY: 2x/week ?  ?PT DURATION: 6 weeks ?  ?PLANNED INTERVENTIONS: Therapeutic exercises, Therapeutic activity, Neuromuscular re-education, Balance training, Gait training, Patient/Family education, Joint manipulation, Joint mobilization, Stair training, Vestibular training, Canalith repositioning, Orthotic/Fit training, and DME instructions ?  ?PLAN FOR NEXT SESSION: Monitor BP- did he follow up with his PCP? Check to see how pt did with standing x1 viewing exercise - and progress as needed. Cont with vestibular/balance exercises and exercises for visual motion sensitivity. Add to HEP for visual motion sensitivity.  ? ? ?Arliss Journey, PT, DPT  ?08/20/2021, 4:56 PM ? ? ?   ? ?

## 2021-08-27 ENCOUNTER — Ambulatory Visit: Payer: Medicare Other | Admitting: Physical Therapy

## 2021-08-27 DIAGNOSIS — R42 Dizziness and giddiness: Secondary | ICD-10-CM

## 2021-08-27 DIAGNOSIS — R2681 Unsteadiness on feet: Secondary | ICD-10-CM | POA: Diagnosis not present

## 2021-08-27 NOTE — Therapy (Signed)
? ?OUTPATIENT PHYSICAL THERAPY VESTIBULAR TREATMENT NOTE ? ? ?Patient Name: Phillip Rich ?MRN: [id number] ?DOB:1950-12-01, 71 y.o., male ?Today's Date: 08/27/2021 ? ?PCP: Lajean Manes, MD ?REFERRING PROVIDER: Lajean Manes, MD ? ? PT End of Session - 08/27/21 1716   ? ? Visit Number 5   ? Number of Visits 13   ? Date for PT Re-Evaluation 10/04/21   pushed out due to delay in scheduling  ? Authorization Type U[employer] Medicare   ? Progress Note Due on Visit 10   ? Behavior During Therapy [facility] for tasks assessed/performed   ? ?  ?  ? ?  ? ? ? ? ?Past Medical History:  ?Diagnosis Date  ? Anxiety   ? BPH (benign prostatic hyperplasia)   ? Complication of anesthesia   ? Diabetes mellitus   ? DJD (degenerative joint disease)   ? GERD (gastroesophageal reflux disease)   ? HTN (hypertension)   ? Hyperlipidemia   ? ILD (interstitial lung disease) ([place])   ? Left renal mass   ? Melanoma ([place])   ? Mild sleep apnea   ? PONV (postoperative nausea and vomiting)   ? ?Past Surgical History:  ?Procedure Laterality Date  ? COLONOSCOPY WITH PROPOFOL N/A 04/03/2014  ? Procedure: COLONOSCOPY WITH PROPOFOL;  Surgeon: Garlan Fair, MD;  Location: [facility];  Service: Endoscopy;  Laterality: N/A;  ? KNEE ARTHROSCOPY Right   ? x2 right, x5 left  ? LASIK    ? MELANOMA EXCISION Left   ? NEPHRECTOMY Left 2022  ? partial  ? PROSTATE ABLATION    ? TOTAL KNEE ARTHROPLASTY    ? left  ? VASECTOMY    ? ?Patient Active Problem List  ? Diagnosis Date Noted  ? ILD (interstitial lung disease) ([place]) 12/19/2010  ? Tobacco abuse 12/19/2010  ? GERD (gastroesophageal reflux disease) 12/19/2010  ? Cough 10/27/2010  ? Anxiety 10/27/2010  ? SOB (shortness of breath) 10/27/2010  ? Lymph node enlargement 10/27/2010  ? ? ?ONSET DATE: 08/01/21 (Referral Date) ? ?REFERRING DIAG: Dizziness/Vertigo ? ?THERAPY DIAG:  ?Dizziness and giddiness ? ?Unsteadiness on feet ? ?PERTINENT HISTORY: Anxiety, DM with Diabetic Neuropathy, DJD, HTN, GERD, Melanoma ? ?PRECAUTIONS:  Fall ? ?SUBJECTIVE: Pt reports he has appt with his PCP next week - will discuss BP readings with him at that visit; pt states he feels that his balance is improving somewhat - "I feel we are going in the right direction"  ? ?PAIN:  ?Are you having pain? No ? ?Blood Pressure readings: 113/91 RUE ;  156/84 LUE ;  150/85 RUE 2ND reading - at start of session ? ?OBJECTIVE:  08-27-21 ?Neuro Re-ed: ? ?Pt able to perform seated horizontal head turns 5 reps with minimal c/o dizziness; pt performed vertical head turns 5 reps with c/o increased dizziness compared to horizontal head turns ? ?Standing Balance: ?Surface: Floor and Pillows ?Position: Feet Hip Width Apart; feet together on pillows ?Completed with: Eyes Open and Eyes Closed; Head Turns x 5 Reps and Head Nods x 5 Reps ? ? ?Pt stood with EC on pillows 15 secs with postural sway - mild LOB posteriorly ? ?Pt stood on Airex inside // bars but no UE support used; made circles with ball clockwise 5 reps, then counterclockwise circles 5 reps ? ?Pt performed amb. Making circles with ball clockwise 35' x 1 rep and then counterclockwise 35' x 1 rep with no LOB but more c/o dizziness with counterclockwise direction ? ? ?VESTIBULAR TREATMENT: ? ?   ?  Gaze Adaptation: ?x1 Viewing Horizontal: standing position - 5' away from target - 30 secs with cues to perform with slightly smaller ROM and with incr ? ?x1 Viewing Vertical: Position: Standing Time: 30 sec - 5' away from target with cues for speed and ROM; pt reported slightly more dizziness with vertical than with horizontal x1 viewing ? ?                                                                         ?PATIENT EDUCATION: ?Education details:  Progressed x1 viewing exercise from 30 secs to 45 secs (standing);  Discussed and recommended pt do walking program on regular basis for balance and incr. Vestibular input with balance - pt states he is member of [employer] but is currently not going ?Person educated:  Patient ?Education method: Explanation ?Education comprehension: verbalized understanding ?  ?  ?GOALS: ?Goals reviewed with patient? Yes ?  ?SHORT TERM GOALS: Target date: 09/06/2021 ?  ?Pt will be independent with initial HEP for improved balance  ?Baseline: no HEP established ?Goal status: INITIAL ?  ?2.  Pt will be able to hold situation 2 of M-CTSIB for >/= 30 seconds to demo improved balance ?Baseline: 20 ?Goal status: INITIAL ?  ?3.  FGA TBA and LTG to be set as applicable     ?Baseline: 25/30 ?Goal status: MET ?  ?  ?LONG TERM GOALS: Target date: 10/04/2021 ?  ?Pt will be independent with final HEP for improved strengthening  ?Baseline: no HEP established ?Goal status: INITIAL ?  ?2.  Pt will improve FGA by 2 points from baseline to demonstrate improved balance and reduced fall risk ?Baseline: 25/30 ?Goal status: REVISED ?  ?3.  Pt will be able to hold situation 4 of M-CTSIB for >/= 15 seconds for improved balance          ?Baseline: 4-5 seconds ?Goal status: INITIAL ?  ?4.  Pt will report ability to watch sporting activity on television for >/= 10 minutes without increase in nausea/dizziness ?Baseline: nausea/dizziness with activity ?Goal status: INITIAL ?  ?  ?ASSESSMENT: ?  ?CLINICAL IMPRESSION: ?Pt has visual motion sensitivity as evidenced by postural instability in standing; pt had no c/o nausea with any visual/vestibular exercises in today's session. Tracking ball while standing on Airex counterclockwise direction caused mild postural instability but pt did not lose balance completely.   Pt unable to maintain balance with EC standing on compliant surface, indicative of decreased vestibular input.  Cont with POC. ?  ?  ?OBJECTIVE IMPAIRMENTS decreased activity tolerance, decreased balance, difficulty walking, dizziness, impaired sensation, and pain.  ?  ?ACTIVITY LIMITATIONS community activity, driving, and yard work.  ?  ?PERSONAL FACTORS Age, Time since onset of injury/illness/exacerbation, and 3+  comorbidities: Anxiety, DM with Diabetic Neuropathy, DJD, HTN, GERD, Melanoma  are also affecting patient's functional outcome.  ?  ?  ?REHAB POTENTIAL: Good ?  ?CLINICAL DECISION MAKING: Stable/uncomplicated ?  ?EVALUATION COMPLEXITY: Low ?  ?  ?PLAN: ?PT FREQUENCY: 2x/week ?  ?PT DURATION: 6 weeks ?  ?PLANNED INTERVENTIONS: Therapeutic exercises, Therapeutic activity, Neuromuscular re-education, Balance training, Gait training, Patient/Family education, Joint manipulation, Joint mobilization, Stair training, Vestibular training, Canalith repositioning, Orthotic/Fit training, and DME instructions ?  ?  PLAN FOR NEXT SESSION: Check to see how pt did with standing x1 viewing exercise - did he increase to 45 secs in standing?  Cont with vestibular/balance exercises and exercises for visual motion sensitivity. ? ? ?VFAWNO, PWKHI PRKSYSD, PT ?08/27/2021, 5:17 PM ? ? ?   ? ?

## 2021-08-29 ENCOUNTER — Ambulatory Visit: Payer: Medicare Other

## 2021-08-29 VITALS — BP 173/97 | HR 104

## 2021-08-29 DIAGNOSIS — R42 Dizziness and giddiness: Secondary | ICD-10-CM

## 2021-08-29 DIAGNOSIS — R2681 Unsteadiness on feet: Secondary | ICD-10-CM

## 2021-08-29 NOTE — Therapy (Signed)
? ?OUTPATIENT PHYSICAL THERAPY VESTIBULAR TREATMENT NOTE ? ? ?Patient Name: Phillip Rich ?MRN: [id number] ?DOB:05/02/1951, 71 y.o., male ?Today's Date: 08/29/2021 ? ?PCP: Lajean Manes, MD ?REFERRING PROVIDER: Lajean Manes, MD ? ? PT End of Session - 08/29/21 1533   ? ? Visit Number 6   ? Number of Visits 13   ? Date for PT Re-Evaluation 10/04/21   pushed out due to delay in scheduling  ? Authorization Type UHC Medicare   ? Progress Note Due on Visit 10   ? PT Start Time 1533   ? PT Stop Time 1612   ? PT Time Calculation (min) 39 min   ? Behavior During Therapy [facility] for tasks assessed/performed   ? ?  ?  ? ?  ? ? ? ? ?Past Medical History:  ?Diagnosis Date  ? Anxiety   ? BPH (benign prostatic hyperplasia)   ? Complication of anesthesia   ? Diabetes mellitus   ? DJD (degenerative joint disease)   ? GERD (gastroesophageal reflux disease)   ? HTN (hypertension)   ? Hyperlipidemia   ? ILD (interstitial lung disease) ([place])   ? Left renal mass   ? Melanoma ([place])   ? Mild sleep apnea   ? PONV (postoperative nausea and vomiting)   ? ?Past Surgical History:  ?Procedure Laterality Date  ? COLONOSCOPY WITH PROPOFOL N/A 04/03/2014  ? Procedure: COLONOSCOPY WITH PROPOFOL;  Surgeon: Garlan Fair, MD;  [facility]: [facility];  Service: Endoscopy;  Laterality: N/A;  ? KNEE ARTHROSCOPY Right   ? x2 right, x5 left  ? LASIK    ? MELANOMA EXCISION Left   ? NEPHRECTOMY Left 2022  ? partial  ? PROSTATE ABLATION    ? TOTAL KNEE ARTHROPLASTY    ? left  ? VASECTOMY    ? ?Patient Active Problem List  ? Diagnosis Date Noted  ? ILD (interstitial lung disease) ([place]) 12/19/2010  ? Tobacco abuse 12/19/2010  ? GERD (gastroesophageal reflux disease) 12/19/2010  ? Cough 10/27/2010  ? Anxiety 10/27/2010  ? SOB (shortness of breath) 10/27/2010  ? Lymph node enlargement 10/27/2010  ? ? ?ONSET DATE: 08/01/21 (Referral Date) ? ?REFERRING DIAG: Dizziness/Vertigo ? ?THERAPY DIAG:  ?Dizziness and giddiness ? ?Unsteadiness on feet ? ?PERTINENT  HISTORY: Anxiety, DM with Diabetic Neuropathy, DJD, HTN, GERD, Melanoma ? ?PRECAUTIONS: Fall ? ?SUBJECTIVE: Has visit with PCP regarding BP next week. No other new changes/complaints.  ? ?PAIN:  ?Are you having pain? No ? ?Vitals:  ? 08/29/21 1547 08/29/21 1601  ?BP: (!) 174/98 (!) 173/97  ?Pulse: (!) 102 (!) 104  ? ? ? ?OBJECTIVE:  ? ?Neuro Re-ed: ? Completed sit <> stands with BLE placed on airex x 5 reps with eyes open, then completed additional 5 reps with eyes closed. With eyes closed, patient reporting increased nausea and dizziness. Intermittent rest breaks due to nausea required and due to elevated BP readings.  ?  ?Standing Balance: ?Surface: Airex ?Position: Narrow Base of Support Feet Hip Width Apart;  ?Completed with: Eyes Open and Eyes Closed; Head Turns x 10 Reps and Head Nods x 10 Reps, completed with narrow BOS with EO and head movement, moved to feet hip width with eyes closed. Increased challenge noted with eyes closed, and vertical > horizontal.  ?Completed standing with narrow BOS on airex and eyes closed 3 x 30 seconds. ? ?Tandem Stance: ? Surface: Floor ?Completed with: Eyes Open;  ? Time: 2 x 30 seconds; intermittent UE support needed and CGA. ?  ?  ?  VESTIBULAR TREATMENT: ?   ?Gaze Adaptation: ?x1 Viewing Horizontal: standing position - 3' away from target - x 45 seconds, then x 60 seconds with cues to perform with slightly smaller ROM and mild nausea ? ?x1 Viewing Vertical: Position: Standing Time: 3' away from target - x 45 seconds, then x 60 seconds with cues to perform with slightly smaller ROM. No dizziness or increase in nausea today with vertical.  ? ? ?                                                                         ?PATIENT EDUCATION: ?Education details:  Continue with HEP ?Person educated: Patient ?Education method: Explanation ?Education comprehension: verbalized understanding ?  ?  ?GOALS: ?Goals reviewed with patient? Yes ?  ?SHORT TERM GOALS: Target date: 09/06/2021 ?  ?Pt  will be independent with initial HEP for improved balance  ?Baseline: no HEP established ?Goal status: INITIAL ?  ?2.  Pt will be able to hold situation 2 of M-CTSIB for >/= 30 seconds to demo improved balance ?Baseline: 20 ?Goal status: INITIAL ?  ?3.  FGA TBA and LTG to be set as applicable     ?Baseline: 25/30 ?Goal status: MET ?  ?  ?LONG TERM GOALS: Target date: 10/04/2021 ?  ?Pt will be independent with final HEP for improved strengthening  ?Baseline: no HEP established ?Goal status: INITIAL ?  ?2.  Pt will improve FGA by 2 points from baseline to demonstrate improved balance and reduced fall risk ?Baseline: 25/30 ?Goal status: REVISED ?  ?3.  Pt will be able to hold situation 4 of M-CTSIB for >/= 15 seconds for improved balance          ?Baseline: 4-5 seconds ?Goal status: INITIAL ?  ?4.  Pt will report ability to watch sporting activity on television for >/= 10 minutes without increase in nausea/dizziness ?Baseline: nausea/dizziness with activity ?Goal status: INITIAL ?  ?  ?ASSESSMENT: ?  ?CLINICAL IMPRESSION: ?Continued activities working on improved vestibular input for balance, main challenge noted with addition of head turns/nods and eyes closed. Frequent rest breaks required due to elevated BP readings. Educated patient to continue to monitor.  ?  ?OBJECTIVE IMPAIRMENTS decreased activity tolerance, decreased balance, difficulty walking, dizziness, impaired sensation, and pain.  ?  ?ACTIVITY LIMITATIONS community activity, driving, and yard work.  ?  ?PERSONAL FACTORS Age, Time since onset of injury/illness/exacerbation, and 3+ comorbidities: Anxiety, DM with Diabetic Neuropathy, DJD, HTN, GERD, Melanoma  are also affecting patient's functional outcome.  ?  ?  ?REHAB POTENTIAL: Good ?  ?CLINICAL DECISION MAKING: Stable/uncomplicated ?  ?EVALUATION COMPLEXITY: Low ?  ?  ?PLAN: ?PT FREQUENCY: 2x/week ?  ?PT DURATION: 6 weeks ?  ?PLANNED INTERVENTIONS: Therapeutic exercises, Therapeutic activity,  Neuromuscular re-education, Balance training, Gait training, Patient/Family education, Joint manipulation, Joint mobilization, Stair training, Vestibular training, Canalith repositioning, Orthotic/Fit training, and DME instructions ?  ?PLAN FOR NEXT SESSION: Continue VOR Progression, tolerated 60 seconds well. May can trial on foam. Cont with vestibular/balance exercises and exercises for visual motion sensitivity. ? ? ?Jones Bales, PT, DPT ?08/29/2021, 4:15 PM ? ? ?   ? ?

## 2021-09-03 ENCOUNTER — Ambulatory Visit: Payer: Medicare Other | Admitting: Physical Therapy

## 2021-09-03 VITALS — BP 170/96 | HR 92

## 2021-09-03 DIAGNOSIS — R42 Dizziness and giddiness: Secondary | ICD-10-CM

## 2021-09-03 DIAGNOSIS — R2681 Unsteadiness on feet: Secondary | ICD-10-CM | POA: Diagnosis not present

## 2021-09-03 NOTE — Therapy (Signed)
? ?OUTPATIENT PHYSICAL THERAPY VESTIBULAR TREATMENT NOTE ? ? ?Patient Name: Phillip Rich ?MRN: [id number] ?DOB:[birth year], 71 y.o., male ?Today's Date: 09/04/2021 ? ?PCP: Lajean Manes, MD ?REFERRING PROVIDER: Lajean Manes, MD ? ? PT End of Session - 09/04/21 1121   ? ? Visit Number 7   ? Number of Visits 13   ? Date for PT Re-Evaluation 10/04/21   pushed out due to delay in scheduling  ? Authorization Type [employer]HC Medicare   ? Progress Note Due on Visit 10   ? PT Start Time 1533   ? PT Stop Time 1622   ? PT Time Calculation (min) 49 min   ? Activity Tolerance Patient tolerated treatment well   ? Behavior During Therapy [facility] for tasks assessed/performed   ? ?  ?  ? ?  ? ? ? ? ? ?Past Medical History:  ?Diagnosis Date  ? Anxiety   ? BPH (benign prostatic hyperplasia)   ? Complication of anesthesia   ? Diabetes mellitus   ? DJD (degenerative joint disease)   ? GERD (gastroesophageal reflux disease)   ? HTN (hypertension)   ? Hyperlipidemia   ? ILD (interstitial lung disease) ([place])   ? Left renal mass   ? Melanoma ([place])   ? Mild sleep apnea   ? PONV (postoperative nausea and vomiting)   ? ?Past Surgical History:  ?Procedure Laterality Date  ? COLONOSCOPY WITH PROPOFOL N/A 04/03/2014  ? Procedure: COLONOSCOPY WITH PROPOFOL;  Surgeon: Garlan Fair, MD;  Location: [facility];  Service: Endoscopy;  Laterality: N/A;  ? KNEE ARTHROSCOPY Right   ? x2 right, x5 left  ? LASIK    ? MELANOMA EXCISION Left   ? NEPHRECTOMY Left 2022  ? partial  ? PROSTATE ABLATION    ? TOTAL KNEE ARTHROPLASTY    ? left  ? VASECTOMY    ? ?Patient Active Problem List  ? Diagnosis Date Noted  ? ILD (interstitial lung disease) ([place]) 12/19/2010  ? Tobacco abuse 12/19/2010  ? GERD (gastroesophageal reflux disease) 12/19/2010  ? Cough 10/27/2010  ? Anxiety 10/27/2010  ? SOB (shortness of breath) 10/27/2010  ? Lymph node enlargement 10/27/2010  ? ? ?ONSET DATE: 08/01/21 (Referral Date) ? ?REFERRING DIAG: Dizziness/Vertigo ? ?THERAPY DIAG:  ?Dizziness  and giddiness ? ?Unsteadiness on feet ? ?PERTINENT HISTORY: Anxiety, DM with Diabetic Neuropathy, DJD, HTN, GERD, Melanoma ? ?PRECAUTIONS: Fall ? ?SUBJECTIVE:  Pt reports he is still having problems with elevated BP;  called and spoke to pharmacist at his doctor's office who is to be discussing the problem with his PCP (Dr. Felipa Eth);  pt reports doing the "x exercise" 2 times a day but is not standing on pillow at home yet ? ?PAIN:  ?Are you having pain? No ? ?Vitals:  ? 09/03/21 1545 09/03/21 1549  ?BP: (!) 161/92 (!) 170/96  ?Pulse: 92   ? ?Initial BP recorded at start of session - LUE with automatic cuff ? 09/03/2021 3:40 PM    ?BP 159/102    ?Pulse 95    ? ? ?OBJECTIVE:  ? ?Neuro Re-ed: ? Standing Balance: ?Surface: Airex ?Position: Narrow Base of Support Feet Hip Width Apart;  ?Completed with: Eyes Open and Eyes Closed; Head Turns x 10 Reps and Head Nods x 10 Reps, completed with narrow BOS with EO and head movement, moved to feet hip width with eyes closed. Increased challenge noted with eyes closed, and vertical > horizontal.  ?Completed standing with narrow BOS on airex and eyes closed  3 x 30 seconds. ? ?Rockerboard activity; pt stood on board inside // bars with UE support prn; performed 10 reps forward/back with EO with minimal UE support; performed 10 reps with EC with 2 finger support on each hand, progressed to finger support on 1 hand only with standing with EC;  performed stepping down to floor forward with head turn to opposite side 5 reps each ?Pt reported mild onset of nausea after performing these exercises ? ?Med[employer] HEP added:  ? ?Access Code: [email or web address] ?URL: [email or web address] ?Date: 09/04/2021 ?Prepared by: Ethelene Browns ? ?Exercises ?- Standing Balance with Eyes Closed on Foam  - 1 x daily - 7 x weekly - 3 sets - 10 reps ? ?  ?Self Care; ?Had lengthy discussion with pt informing him that his problem appears to be due to motion sensitivity and decreased vestibular input  (vestibular hypofunction); pt was informed that his problem was not the "crystals in his ear being out of place" as he inquired if that could be part of his problem.  Explained to pt that we use somatosensory, vision (not acuity but VOR/gaze stabilization) and vestibular in maintaining balance.  Pt reported that he uses 3-4 different pairs of reading glasses at home depending on the activity he is doing, due to his inability to wear bifocals.   ?Strongly recommended and encouraged pt that he needs to do x1 viewing exercise 2-3x/day and also start doing exercise of standing on compliant surface with EO & EC on regular basis.   ?   ?                                                                         ?PATIENT EDUCATION: ?Education details:  Med[employer] [id number] ?Person educated: Patient ?Education method: Explanation ?Education comprehension: verbalized understanding ?  ?  ?GOALS: ?Goals reviewed with patient? Yes ?  ?SHORT TERM GOALS: Target date: 09/06/2021 ?  ?Pt will be independent with initial HEP for improved balance  ?Baseline: no HEP established ?Goal status: INITIAL ?  ?2.  Pt will be able to hold situation 2 of M-CTSIB for >/= 30 seconds to demo improved balance ?Baseline: 20 ?Goal status: INITIAL ?  ?3.  FGA TBA and LTG to be set as applicable     ?Baseline: 25/30 ?Goal status: MET ?  ?  ?LONG TERM GOALS: Target date: 10/04/2021 ?  ?Pt will be independent with final HEP for improved strengthening  ?Baseline: no HEP established ?Goal status: INITIAL ?  ?2.  Pt will improve FGA by 2 points from baseline to demonstrate improved balance and reduced fall risk ?Baseline: 25/30 ?Goal status: REVISED ?  ?3.  Pt will be able to hold situation 4 of M-CTSIB for >/= 15 seconds for improved balance          ?Baseline: 4-5 seconds ?Goal status: INITIAL ?  ?4.  Pt will report ability to watch sporting activity on television for >/= 10 minutes without increase in nausea/dizziness ?Baseline: nausea/dizziness with  activity ?Goal status: INITIAL ?  ?  ?ASSESSMENT: ?  ?CLINICAL IMPRESSION: ?Pt continues to have difficulty maintaining balance on compliant surface with EC indicative of vestibular hypofunction.  Cont. To have elevated BP and reports occasional feeling of "flushing sensation from  his neck up" which possibly could be related to elevated BP.  Pt also appears to have visual motion sensitivity but reports the x1 viewing exercise is getting easier with less symptoms provoked. ? ?  ?OBJECTIVE IMPAIRMENTS decreased activity tolerance, decreased balance, difficulty walking, dizziness, impaired sensation, and pain.  ?  ?ACTIVITY LIMITATIONS community activity, driving, and yard work.  ?  ?PERSONAL FACTORS Age, Time since onset of injury/illness/exacerbation, and 3+ comorbidities: Anxiety, DM with Diabetic Neuropathy, DJD, HTN, GERD, Melanoma  are also affecting patient's functional outcome.  ?  ?  ?REHAB POTENTIAL: Good ?  ?CLINICAL DECISION MAKING: Stable/uncomplicated ?  ?EVALUATION COMPLEXITY: Low ?  ?  ?PLAN: ?PT FREQUENCY: 2x/week ?  ?PT DURATION: 6 weeks ?  ?PLANNED INTERVENTIONS: Therapeutic exercises, Therapeutic activity, Neuromuscular re-education, Balance training, Gait training, Patient/Family education, Joint manipulation, Joint mobilization, Stair training, Vestibular training, Canalith repositioning, Orthotic/Fit training, and DME instructions ?  ?PLAN FOR NEXT SESSION: Check STG's - due 09-06-21:  Do SOT?  - Continue VOR Progression; May can trial on foam. Cont with vestibular/balance exercises and exercises for visual motion sensitivity. ? ? ?BMBOMQ, TTCNG [id number], PT, DPT ?09/04/2021, 11:48 AM ? ? ?   ? ?

## 2021-09-06 ENCOUNTER — Ambulatory Visit: Payer: Medicare Other

## 2021-09-06 VITALS — BP 170/94 | HR 110

## 2021-09-06 DIAGNOSIS — R42 Dizziness and giddiness: Secondary | ICD-10-CM

## 2021-09-06 DIAGNOSIS — N181 Chronic kidney disease, stage 1: Secondary | ICD-10-CM | POA: Diagnosis not present

## 2021-09-06 DIAGNOSIS — K219 Gastro-esophageal reflux disease without esophagitis: Secondary | ICD-10-CM | POA: Diagnosis not present

## 2021-09-06 DIAGNOSIS — R2681 Unsteadiness on feet: Secondary | ICD-10-CM

## 2021-09-06 DIAGNOSIS — I10 Essential (primary) hypertension: Secondary | ICD-10-CM | POA: Diagnosis not present

## 2021-09-06 DIAGNOSIS — E1142 Type 2 diabetes mellitus with diabetic polyneuropathy: Secondary | ICD-10-CM | POA: Diagnosis not present

## 2021-09-06 DIAGNOSIS — E7800 Pure hypercholesterolemia, unspecified: Secondary | ICD-10-CM | POA: Diagnosis not present

## 2021-09-06 NOTE — Patient Instructions (Signed)
Gaze Stabilization: Standing Feet Apart (Compliant Surface) ? ? ? ?Feet apart on pillow, keeping eyes on target on wall 3-4 feet away, tilt head down 15-30? and move head side to side for 60 seconds. Repeat while moving head up and down for 60 seconds. ?Do 2-3 sessions per day. ? ? ?

## 2021-09-06 NOTE — Therapy (Signed)
? ?OUTPATIENT PHYSICAL THERAPY VESTIBULAR TREATMENT NOTE ? ? ?Patient Name: Phillip Rich ?MRN: [id number] ?DOB:Sep 15, 1950, 71 y.o., male ?Today's Date: 09/06/2021 ? ?PCP: Lajean Manes, MD ?REFERRING PROVIDER: Lajean Manes, MD ? ? PT End of Session - 09/06/21 1404   ? ? Visit Number 8   ? Number of Visits 13   ? Date for PT Re-Evaluation 10/04/21   pushed out due to delay in scheduling  ? Authorization Type [employer] Medicare   ? Progress Note Due on Visit 10   ? PT Start Time 1402   ? PT Stop Time 5110   ? PT Time Calculation (min) 40 min   ? Activity Tolerance Patient tolerated treatment well   ? Behavior During Therapy [facility] for tasks assessed/performed   ? ?  ?  ? ?  ? ? ? ? ? ?Past Medical History:  ?Diagnosis Date  ? Anxiety   ? BPH (benign prostatic hyperplasia)   ? Complication of anesthesia   ? Diabetes mellitus   ? DJD (degenerative joint disease)   ? GERD (gastroesophageal reflux disease)   ? HTN (hypertension)   ? Hyperlipidemia   ? ILD (interstitial lung disease) ([place])   ? Left renal mass   ? Melanoma ([place])   ? Mild sleep apnea   ? PONV (postoperative nausea and vomiting)   ? ?Past Surgical History:  ?Procedure Laterality Date  ? COLONOSCOPY WITH PROPOFOL N/A 04/03/2014  ? Procedure: COLONOSCOPY WITH PROPOFOL;  Surgeon: Garlan Fair, MD;  Location: [facility];  Service: Endoscopy;  Laterality: N/A;  ? KNEE ARTHROSCOPY Right   ? x2 right, x5 left  ? LASIK    ? MELANOMA EXCISION Left   ? NEPHRECTOMY Left 2022  ? partial  ? PROSTATE ABLATION    ? TOTAL KNEE ARTHROPLASTY    ? left  ? VASECTOMY    ? ?Patient Active Problem List  ? Diagnosis Date Noted  ? ILD (interstitial lung disease) ([place]) 12/19/2010  ? Tobacco abuse 12/19/2010  ? GERD (gastroesophageal reflux disease) 12/19/2010  ? Cough 10/27/2010  ? Anxiety 10/27/2010  ? SOB (shortness of breath) 10/27/2010  ? Lymph node enlargement 10/27/2010  ? ? ?ONSET DATE: 08/01/21 (Referral Date) ? ?REFERRING DIAG: Dizziness/Vertigo ? ?THERAPY DIAG:  ?Dizziness  and giddiness ? ?Unsteadiness on feet ? ?PERTINENT HISTORY: Anxiety, DM with Diabetic Neuropathy, DJD, HTN, GERD, Melanoma ? ?PRECAUTIONS: Fall ? ?SUBJECTIVE:  Patient reports that Dr. Felipa Eth has doubled the blood pressure medications. Has not taken it at home. No other new changes. Reports that he knows the balance is doing better, easier to balance on one leg to slide the pants on.  ? ?PAIN:  ?Are you having pain? No ? ?Vitals:  ? 09/06/21 1425 09/06/21 1439  ?BP: (!) 150/92 (!) 170/94  ?Pulse: (!) 105 (!) 110  ? ? ? ?OBJECTIVE:  ? ?Neuro Re-ed: ?M-CTSIB: Situation 1: 30 seconds, Situation 2: 30 seconds, Situation 3: 30 seconds, Situation 4: 24 seconds ? ?Gaze Adaptation: ?x1 Viewing Horizontal: Position: Standing with Bil Stance on Airex, Time: 60 sec, x 1 Comment: cues for continuous head motion and slightly incr speed. ?  ?x1 Viewing Vertical: Position: Standing with Bil Stance on Airex, Time: 60 sec, x 1 Comment: cues for continuous head motion and slightly incr speed. Pt reporting no dizziness, but increased postural sway on airex ? ?Updated Gaze HEP: ?Gaze Stabilization: Standing Feet Apart (Compliant Surface) ? ? ? ?Feet apart on pillow, keeping eyes on target on wall 3-4 feet  away, tilt head down 15-30? and move head side to side for 60 seconds. Repeat while moving head up and down for 60 seconds. ?Do 2-3 sessions Phillip day. ? ?Standing Balance: ?Standing on [place] on Incline: With staggered stance completed standing EO with horizontal/vertical head turns x 10 reps, alternating foot position. Then completed eyes closed x 30 seconds. CGA required, increased challenge with RLE posterior noted and vertical > horizontal head turns.  ? ? ?Reviewed HEP and updated to patient's tolerance: ?Access Code: [email or web address] ?URL: [email or web address] ?Date: 09/06/2021 ?Prepared by: Baldomero Lamy ? ?Exercises ?- Standing Balance with Eyes Closed on Foam  - 1 x daily - 7 x weekly - 1 sets - 3 reps - 30  seconds hold ?- Corner Balance Feet Apart: Eyes Closed With Head Turns  - 1 x daily - 7 x weekly - 2 sets - 10 reps ?   ?                                                                         ?PATIENT EDUCATION: ?Education details:  Progress toward STGs; Updated HEP; Continue to Monitor BP ?Person educated: Patient ?Education method: Explanation ?Education comprehension: verbalized understanding ?  ?  HOME EXERCISE PROGRAM:  ? Medbridge [id number] ? ?GOALS: ?Goals reviewed with patient? Yes ?  ?SHORT TERM GOALS: Target date: 09/06/2021 ?  ?Pt will be independent with initial HEP for improved balance  ?Baseline: no HEP established; reports independence with current HEP ?Goal status: MET ?  ?2.  Pt will be able to hold situation 2 of M-CTSIB for >/= 30 seconds to demo improved balance ?Baseline: 20 seconds ?Goal status: MET ?  ?3.  FGA TBA and LTG to be set as applicable     ?Baseline: 25/30 ?Goal status: MET ?  ?  ?LONG TERM GOALS: Target date: 10/04/2021 ?  ?Pt will be independent with final HEP for improved strengthening  ?Baseline: no HEP established ?Goal status: INITIAL ?  ?2.  Pt will improve FGA by 2 points from baseline to demonstrate improved balance and reduced fall risk ?Baseline: 25/30 ?Goal status: REVISED ?  ?3.  Pt will be able to hold situation 4 of M-CTSIB for >/= 15 seconds for improved balance          ?Baseline: 4-5 seconds ?Goal status: INITIAL ?  ?4.  Pt will report ability to watch sporting activity on television for >/= 10 minutes without increase in nausea/dizziness ?Baseline: nausea/dizziness with activity ?Goal status: INITIAL ?  ?  ?ASSESSMENT: ?  ?CLINICAL IMPRESSION: ?Assessed patient's progress toward STGs with patient able to meet all STGs demonstrating progress with PT services. BP continues to be elevated despite MD increasing dosage of blood pressure medication. Continued NMR focused on head turns (specifically vertical > horizontal) and eyes closed. Will continue Phillip POC.  ? ?   ?OBJECTIVE IMPAIRMENTS decreased activity tolerance, decreased balance, difficulty walking, dizziness, impaired sensation, and pain.  ?  ?ACTIVITY LIMITATIONS community activity, driving, and yard work.  ?  ?PERSONAL FACTORS Age, Time since onset of injury/illness/exacerbation, and 3+ comorbidities: Anxiety, DM with Diabetic Neuropathy, DJD, HTN, GERD, Melanoma  are also affecting patient's functional outcome.  ?  ?  ?REHAB POTENTIAL: Good ?  ?CLINICAL  DECISION MAKING: Stable/uncomplicated ?  ?EVALUATION COMPLEXITY: Low ?  ?  ?PLAN: ?PT FREQUENCY: 2x/week ?  ?PT DURATION: 6 weeks ?  ?PLANNED INTERVENTIONS: Therapeutic exercises, Therapeutic activity, Neuromuscular re-education, Balance training, Gait training, Patient/Family education, Joint manipulation, Joint mobilization, Stair training, Vestibular training, Canalith repositioning, Orthotic/Fit training, and DME instructions ?  ?PLAN FOR NEXT SESSION: Complete SOT  - Continue VOR Progression; May can trial on foam. Cont with vestibular/balance exercises and exercises for visual motion sensitivity. ? ? ?Jones Bales, PT, DPT ?09/06/2021, 2:55 PM ? ? ?   ? ?

## 2021-09-10 ENCOUNTER — Ambulatory Visit: Payer: Medicare Other | Attending: Psychiatry

## 2021-09-10 VITALS — BP 169/100 | HR 102

## 2021-09-10 DIAGNOSIS — R2681 Unsteadiness on feet: Secondary | ICD-10-CM | POA: Insufficient documentation

## 2021-09-10 DIAGNOSIS — R42 Dizziness and giddiness: Secondary | ICD-10-CM | POA: Insufficient documentation

## 2021-09-10 NOTE — Therapy (Signed)
? ?OUTPATIENT PHYSICAL THERAPY VESTIBULAR TREATMENT NOTE/ARRIVED NO CHARGE ? ? ?Patient Name: Phillip Rich ?MRN: [id number] ?DOB:Jan 16, 1951, 71 y.o., male ?Today's Date: 09/10/2021 ? ?PCP: Lajean Manes, MD ?REFERRING PROVIDER: Genia Harold, MD ? ? PT End of Session - 09/10/21 1406   ? ? Visit Number 9   ? Number of Visits 13   ? Date for PT Re-Evaluation 10/04/21   pushed out due to delay in scheduling  ? Authorization Type UHC Medicare   ? Progress Note Due on Visit 10   ? PT Start Time 1406   pt arrived late  ? PT Stop Time 1420   ? PT Time Calculation (min) 14 min   ? Activity Tolerance Patient tolerated treatment well   ? Behavior During Therapy [facility] for tasks assessed/performed   ? ?  ?  ? ?  ? ? ? ? ? ?Past Medical History:  ?Diagnosis Date  ? Anxiety   ? BPH (benign prostatic hyperplasia)   ? Complication of anesthesia   ? Diabetes mellitus   ? DJD (degenerative joint disease)   ? GERD (gastroesophageal reflux disease)   ? HTN (hypertension)   ? Hyperlipidemia   ? ILD (interstitial lung disease) ([place])   ? Left renal mass   ? Melanoma ([place])   ? Mild sleep apnea   ? PONV (postoperative nausea and vomiting)   ? ?Past Surgical History:  ?Procedure Laterality Date  ? COLONOSCOPY WITH PROPOFOL N/A 04/03/2014  ? Procedure: COLONOSCOPY WITH PROPOFOL;  Surgeon: Garlan Fair, MD;  Location: [facility];  Service: Endoscopy;  Laterality: N/A;  ? KNEE ARTHROSCOPY Right   ? x2 right, x5 left  ? LASIK    ? MELANOMA EXCISION Left   ? NEPHRECTOMY Left 2022  ? partial  ? PROSTATE ABLATION    ? TOTAL KNEE ARTHROPLASTY    ? left  ? VASECTOMY    ? ?Patient Active Problem List  ? Diagnosis Date Noted  ? ILD (interstitial lung disease) ([place]) 12/19/2010  ? Tobacco abuse 12/19/2010  ? GERD (gastroesophageal reflux disease) 12/19/2010  ? Cough 10/27/2010  ? Anxiety 10/27/2010  ? SOB (shortness of breath) 10/27/2010  ? Lymph node enlargement 10/27/2010  ? ? ?ONSET DATE: 08/01/21 (Referral Date) ? ?REFERRING DIAG:  Dizziness/Vertigo ? ?THERAPY DIAG:  ?Dizziness and giddiness ? ?Unsteadiness on feet ? ?PERTINENT HISTORY: Anxiety, DM with Diabetic Neuropathy, DJD, HTN, GERD, Melanoma ? ?PRECAUTIONS: Fall ? ?SUBJECTIVE:  Patient reports blood pressure machine was dead, unable to assess at home. Reports he did not sleep well last night. Reports he also got new shoes and they feel like they throw his balance off. Reports he does not feel well.  ? ? ?PAIN:  ?Are you having pain? No ?Vitals:  ? 09/10/21 1412 09/10/21 1428  ?BP: (!) 171/110 (!) 169/100  ?Pulse: (!) 112 (!) 102  ? ?                  ?Due to elevated BP readings at start of session and minimal improvements with seated rest break. PT withheld due to safety concerns.  ? ? ?PATIENT EDUCATION: ?Education details:  Monitor BP; Take Medications as Prescribed. ?Person educated: Patient ?Education method: Explanation ?Education comprehension: verbalized understanding ?  ?  HOME EXERCISE PROGRAM:  ? Medbridge [id number] ? ?GOALS: ?Goals reviewed with patient? Yes ?  ?SHORT TERM GOALS: Target date: 09/06/2021 ?  ?Pt will be independent with initial HEP for improved balance  ?Baseline: no HEP established; reports independence  with current HEP ?Goal status: MET ?  ?2.  Pt will be able to hold situation 2 of M-CTSIB for >/= 30 seconds to demo improved balance ?Baseline: 20 seconds ?Goal status: MET ?  ?3.  FGA TBA and LTG to be set as applicable     ?Baseline: 25/30 ?Goal status: MET ?  ?  ?LONG TERM GOALS: Target date: 10/04/2021 ?  ?Pt will be independent with final HEP for improved strengthening  ?Baseline: no HEP established ?Goal status: INITIAL ?  ?2.  Pt will improve FGA by 2 points from baseline to demonstrate improved balance and reduced fall risk ?Baseline: 25/30 ?Goal status: REVISED ?  ?3.  Pt will be able to hold situation 4 of M-CTSIB for >/= 15 seconds for improved balance          ?Baseline: 4-5 seconds ?Goal status: INITIAL ?  ?4.  Pt will report ability to watch  sporting activity on television for >/= 10 minutes without increase in nausea/dizziness ?Baseline: nausea/dizziness with activity ?Goal status: INITIAL ?  ?  ?ASSESSMENT: ?  ?CLINICAL IMPRESSION: ?Patient presenting to PT session with patient having elevated BP readings and unsafe to participate in PT services. Patient reporting that he is taking BP medications at night, educated that it may be beneficial to take them in morning so more active during waking hours but encouraged to follow up with PCP regarding BP and medication dosage. Patient verbalize understanding. Will continue to monitor.  ? ?  ?OBJECTIVE IMPAIRMENTS decreased activity tolerance, decreased balance, difficulty walking, dizziness, impaired sensation, and pain.  ?  ?ACTIVITY LIMITATIONS community activity, driving, and yard work.  ?  ?PERSONAL FACTORS Age, Time since onset of injury/illness/exacerbation, and 3+ comorbidities: Anxiety, DM with Diabetic Neuropathy, DJD, HTN, GERD, Melanoma  are also affecting patient's functional outcome.  ?  ?  ?REHAB POTENTIAL: Good ?  ?CLINICAL DECISION MAKING: Stable/uncomplicated ?  ?EVALUATION COMPLEXITY: Low ?  ?  ?PLAN: ?PT FREQUENCY: 2x/week ?  ?PT DURATION: 6 weeks ?  ?PLANNED INTERVENTIONS: Therapeutic exercises, Therapeutic activity, Neuromuscular re-education, Balance training, Gait training, Patient/Family education, Joint manipulation, Joint mobilization, Stair training, Vestibular training, Canalith repositioning, Orthotic/Fit training, and DME instructions ?  ?PLAN FOR NEXT SESSION: Monitor BP. Complete SOT  - Continue VOR Progression; May can trial on foam. Cont with vestibular/balance exercises and exercises for visual motion sensitivity. ? ? ?Jones Bales, PT, DPT ?09/10/2021, 2:36 PM ? ? ?   ? ?

## 2021-09-12 ENCOUNTER — Encounter: Payer: Self-pay | Admitting: Physical Therapy

## 2021-09-12 ENCOUNTER — Ambulatory Visit: Payer: Medicare Other | Admitting: Physical Therapy

## 2021-09-12 VITALS — BP 165/91 | HR 101

## 2021-09-12 DIAGNOSIS — R42 Dizziness and giddiness: Secondary | ICD-10-CM

## 2021-09-12 DIAGNOSIS — I10 Essential (primary) hypertension: Secondary | ICD-10-CM | POA: Diagnosis not present

## 2021-09-12 NOTE — Therapy (Signed)
? ?OUTPATIENT PHYSICAL THERAPY VESTIBULAR TREATMENT NOTE/ARRIVED NO CHARGE ? ? ?Patient Name: Phillip Rich ?MRN: [id number] ?DOB:1950-11-16, 71 y.o., male ?Today's Date: 09/12/2021 ? ?PCP: Lajean Manes, MD ?REFERRING PROVIDER: Lajean Manes, MD ? ? PT End of Session - 09/12/21 1506   ? ? Visit Number 9   ? Number of Visits 13   ? Date for PT Re-Evaluation 10/04/21   pushed out due to delay in scheduling  ? Authorization Type UHC Medicare   ? Authorization - Visit Number 9   ? Progress Note Due on Visit 10   ? PT Start Time 5885   ? PT Stop Time 1502   ? PT Time Calculation (min) 16 min   ? Activity Tolerance Patient tolerated treatment well   ? Behavior During Therapy [facility] for tasks assessed/performed   ? ?  ?  ? ?  ? ? ? ? ? ?Past Medical History:  ?Diagnosis Date  ? Anxiety   ? BPH (benign prostatic hyperplasia)   ? Complication of anesthesia   ? Diabetes mellitus   ? DJD (degenerative joint disease)   ? GERD (gastroesophageal reflux disease)   ? HTN (hypertension)   ? Hyperlipidemia   ? ILD (interstitial lung disease) ([place])   ? Left renal mass   ? Melanoma ([place])   ? Mild sleep apnea   ? PONV (postoperative nausea and vomiting)   ? ?Past Surgical History:  ?Procedure Laterality Date  ? COLONOSCOPY WITH PROPOFOL N/A 04/03/2014  ? Procedure: COLONOSCOPY WITH PROPOFOL;  Surgeon: Garlan Fair, MD;  Location: [facility];  Service: Endoscopy;  Laterality: N/A;  ? KNEE ARTHROSCOPY Right   ? x2 right, x5 left  ? LASIK    ? MELANOMA EXCISION Left   ? NEPHRECTOMY Left 2022  ? partial  ? PROSTATE ABLATION    ? TOTAL KNEE ARTHROPLASTY    ? left  ? VASECTOMY    ? ?Patient Active Problem List  ? Diagnosis Date Noted  ? ILD (interstitial lung disease) ([place]) 12/19/2010  ? Tobacco abuse 12/19/2010  ? GERD (gastroesophageal reflux disease) 12/19/2010  ? Cough 10/27/2010  ? Anxiety 10/27/2010  ? SOB (shortness of breath) 10/27/2010  ? Lymph node enlargement 10/27/2010  ? ? ?ONSET DATE: 08/01/21 (Referral Date) ? ?REFERRING  DIAG: Dizziness/Vertigo ? ?THERAPY DIAG:  ?Dizziness and giddiness ? ?PERTINENT HISTORY: Anxiety, DM with Diabetic Neuropathy, DJD, HTN, GERD, Melanoma ? ?PRECAUTIONS: Fall ? ?SUBJECTIVE:  Pt reports batteries are dead in his blood pressure machine at home; pt states he feels very "jittery"; states he did not sleep well last night:  pt states he has started taking his BP medication in the mornings rather than at night time, as recommended by primary PT, [facility] ? ?PAIN:  ?Are you having pain? No ? ?Vitals:  ? 09/12/21 1452 09/12/21 1457  ?BP: (!) 162/102 (!) 165/91  ?Pulse: (!) 110 (!) 101  ? ?                  ?Due to elevated BP readings at start of session and minimal improvements with seated rest break PT withheld due to safety concerns.  ? ?Pt states he has appt with his PCP, Dr. Felipa Eth, next Tuesday, May 9; pt states he is going to call and make appt is not just for bloodwork, and that he will be able to discuss elevated BP with him.  ? ? ?PATIENT EDUCATION: ?Education details:  Monitor BP; Take Medications as Prescribed. ?Person educated: Patient ?  Education method: Explanation ?Education comprehension: verbalized understanding ?  ?  HOME EXERCISE PROGRAM:  ? Medbridge [id number] ? ?GOALS: ?Goals reviewed with patient? Yes ?  ?SHORT TERM GOALS: Target date: 09/06/2021 ?  ?Pt will be independent with initial HEP for improved balance  ?Baseline: no HEP established; reports independence with current HEP ?Goal status: MET ?  ?2.  Pt will be able to hold situation 2 of M-CTSIB for >/= 30 seconds to demo improved balance ?Baseline: 20 seconds ?Goal status: MET ?  ?3.  FGA TBA and LTG to be set as applicable     ?Baseline: 25/30 ?Goal status: MET ?  ?  ?LONG TERM GOALS: Target date: 10/04/2021 ?  ?Pt will be independent with final HEP for improved strengthening  ?Baseline: no HEP established ?Goal status: INITIAL ?  ?2.  Pt will improve FGA by 2 points from baseline to demonstrate improved balance and reduced  fall risk ?Baseline: 25/30 ?Goal status: REVISED ?  ?3.  Pt will be able to hold situation 4 of M-CTSIB for >/= 15 seconds for improved balance          ?Baseline: 4-5 seconds ?Goal status: INITIAL ?  ?4.  Pt will report ability to watch sporting activity on television for >/= 10 minutes without increase in nausea/dizziness ?Baseline: nausea/dizziness with activity ?Goal status: INITIAL ?  ?  ?ASSESSMENT: ?  ?CLINICAL IMPRESSION: ?Patient presenting to PT session with patient having elevated BP readings and unsafe to participate in PT services. Patient reporting that he is taking BP medications at night, educated that it may be beneficial to take them in morning so more active during waking hours but encouraged to follow up with PCP regarding BP and medication dosage. Patient verbalize understanding. Will continue to monitor.  ? ?  ?OBJECTIVE IMPAIRMENTS decreased activity tolerance, decreased balance, difficulty walking, dizziness, impaired sensation, and pain.  ?  ?ACTIVITY LIMITATIONS community activity, driving, and yard work.  ?  ?PERSONAL FACTORS Age, Time since onset of injury/illness/exacerbation, and 3+ comorbidities: Anxiety, DM with Diabetic Neuropathy, DJD, HTN, GERD, Melanoma  are also affecting patient's functional outcome.  ?  ?  ?REHAB POTENTIAL: Good ?  ?CLINICAL DECISION MAKING: Stable/uncomplicated ?  ?EVALUATION COMPLEXITY: Low ?  ?  ?PLAN: ?PT FREQUENCY: 2x/week ?  ?PT DURATION: 6 weeks ?  ?PLANNED INTERVENTIONS: Therapeutic exercises, Therapeutic activity, Neuromuscular re-education, Balance training, Gait training, Patient/Family education, Joint manipulation, Joint mobilization, Stair training, Vestibular training, Canalith repositioning, Orthotic/Fit training, and DME instructions ?  ?PLAN FOR NEXT SESSION: Monitor BP. Complete SOT  - Continue VOR Progression; May can trial on foam. Cont with vestibular/balance exercises and exercises for visual motion sensitivity. ? ? ?YWVXUC, JARWP  TYYPEJY, [place] ?09/12/2021, 3:08 PM ? ? ?   ? ?

## 2021-09-17 ENCOUNTER — Encounter: Payer: Self-pay | Admitting: Physical Therapy

## 2021-09-17 ENCOUNTER — Ambulatory Visit: Payer: Medicare Other | Admitting: Physical Therapy

## 2021-09-17 VITALS — BP 162/92 | HR 103

## 2021-09-17 DIAGNOSIS — R42 Dizziness and giddiness: Secondary | ICD-10-CM

## 2021-09-17 DIAGNOSIS — R2681 Unsteadiness on feet: Secondary | ICD-10-CM

## 2021-09-17 NOTE — Therapy (Signed)
? ?OUTPATIENT PHYSICAL THERAPY VESTIBULAR TREATMENT NOTE/10TH VISIT PROGRESS NOTE ? ?Patient Name: Phillip Rich ?MRN: [id number] ?DOB:[birth year], 71 y.o., male ?Today's Date: 09/17/2021 ? ?PCP: Lajean Manes, MD ?REFERRING PROVIDER: Genia Harold, MD ? ?10th Visit Physical Therapy Progress Note ? ?Dates of Reporting Period: 08/08/21 to 09/17/21 ? ? ? PT End of Session - 09/17/21 1452   ? ? Visit Number 10   ? Number of Visits 13   ? Date for PT Re-Evaluation 10/04/21   pushed out due to delay in scheduling  ? Authorization Type [employer] Medicare   ? Authorization - Visit Number 10   ? Progress Note Due on Visit 10   ? PT Start Time 1450   ? PT Stop Time 1530   ? PT Time Calculation (min) 40 min   ? Activity Tolerance Patient tolerated treatment well   ? Behavior During Therapy [facility] for tasks assessed/performed   ? ?  ?  ? ?  ? ? ? ? ? ?Past Medical History:  ?Diagnosis Date  ? Anxiety   ? BPH (benign prostatic hyperplasia)   ? Complication of anesthesia   ? Diabetes mellitus   ? DJD (degenerative joint disease)   ? GERD (gastroesophageal reflux disease)   ? HTN (hypertension)   ? Hyperlipidemia   ? ILD (interstitial lung disease) ([place])   ? Left renal mass   ? Melanoma ([place])   ? Mild sleep apnea   ? PONV (postoperative nausea and vomiting)   ? ?Past Surgical History:  ?Procedure Laterality Date  ? COLONOSCOPY WITH PROPOFOL N/A 04/03/2014  ? Procedure: COLONOSCOPY WITH PROPOFOL;  Surgeon: Garlan Fair, MD;  Location: [facility];  Service: Endoscopy;  Laterality: N/A;  ? KNEE ARTHROSCOPY Right   ? x2 right, x5 left  ? LASIK    ? MELANOMA EXCISION Left   ? NEPHRECTOMY Left 2022  ? partial  ? PROSTATE ABLATION    ? TOTAL KNEE ARTHROPLASTY    ? left  ? VASECTOMY    ? ?Patient Active Problem List  ? Diagnosis Date Noted  ? ILD (interstitial lung disease) ([place]) 12/19/2010  ? Tobacco abuse 12/19/2010  ? GERD (gastroesophageal reflux disease) 12/19/2010  ? Cough 10/27/2010  ? Anxiety 10/27/2010  ? SOB (shortness of breath)  10/27/2010  ? Lymph node enlargement 10/27/2010  ? ? ?ONSET DATE: 08/01/21 (Referral Date) ? ?REFERRING DIAG: Dizziness/Vertigo ? ?THERAPY DIAG:  ?Dizziness and giddiness ? ?Unsteadiness on feet ? ?PERTINENT HISTORY: Anxiety, DM with Diabetic Neuropathy, DJD, HTN, GERD, Melanoma ? ?PRECAUTIONS: Fall ? ?SUBJECTIVE:  Took his BP before he got here at it was 156/95. Brought in his own cuff from home.  ? ?PAIN:  ?Are you having pain? No ? ?Today's Vitals  ? 09/17/21 1459 09/17/21 1503 09/17/21 1530  ?BP: (!) 168/89 (!) 158/84 (!) 162/92  ?Pulse: (!) 103    ? ?There is no height or weight on file to calculate BMI. ? ?TREATMENT: ? ?Self-Care: ?Pt brought in his own BP monitor from home and showed therapist how he set it up and took 2 measures. The first measure was 151/101 and the 2nd was 162/98. Therapist assessed with automatic cuff and was 168/89. Due to getting 3 different readings, PT re-assessed again with manual cuff and BP was 158/84. Pt did not have the instruction booklet with him to the session today. Discussed having pt look at the instruction booklet at home and see if there is a way to re-calibrate his BP cuff as it  is reading higher than the other readings.  ? ? ?Standing Balance: ?Surface: Airex ?Position: Feet Hip Width Apart Wide Base of Support ?Completed with: Eyes Closed; with more narrow BOS ? ?Feet together on foam - 2 reps of 7 seconds each, with pt losing balance posteriorly into the wall. ? ?Performed with feet slightly apart for 3 x 30 seconds with EC.  ? ?Wide wider BOS on air ex ?Head Turns x 2 sets of 5 Reps and Head Nods x 3 sets of 5 Reps ?Pt needing min A  at times for balance with head nods.  ? ?At end of session, pt wishing to perform again, but with more narrow BOS x5 reps head turns and x5 reps head nods with pt demonstrating improved balance.  ?  ?Gaze Adaptation: ?x1 Viewing Horizontal: Position: Standing with Feet together on Airex, Time: 2 x 30 sec, Comment: cues for continuous head  motion and slightly incr speed. Mild dizziness when slightly incr speed.  ?  ?x1 Viewing Vertical: Position: Standing narrow BOS on Airex, Time: 2 x 30 sec Comment: cues for continuous head motion and slightly incr speed. Pt reporting up and down felt better.  ? ? ?PATIENT EDUCATION: ?Education details:  Trying to recalibrate his BP monitor for home as it kept reading higher than readings taken today. Progress towards goals.  ?Person educated: Patient ?Education method: Explanation ?Education comprehension: verbalized understanding ?  ?  HOME EXERCISE PROGRAM:  ? Medbridge [id number] ? ?GOALS: ?Goals reviewed with patient? Yes ?  ?SHORT TERM GOALS: Target date: 09/06/2021 ?  ?Pt will be independent with initial HEP for improved balance  ?Baseline: no HEP established; reports independence with current HEP ?Goal status: MET ?  ?2.  Pt will be able to hold situation 2 of M-CTSIB for >/= 30 seconds to demo improved balance ?Baseline: 20 seconds ?Goal status: MET ?  ?3.  FGA TBA and LTG to be set as applicable     ?Baseline: 25/30 ?Goal status: MET ?  ?  ?LONG TERM GOALS: Target date: 10/04/2021 ?  ?Pt will be independent with final HEP for improved strengthening  ?Baseline: no HEP established ?Goal status: INITIAL ?  ?2.  Pt will improve FGA by 2 points from baseline to demonstrate improved balance and reduced fall risk ?Baseline: 25/30 ?Goal status: REVISED ?  ?3.  Pt will be able to hold situation 4 of M-CTSIB for >/= 15 seconds for improved balance          ?Baseline: 4-5 seconds; 7 seconds on 09/17/21 ?Goal status: ON-GOING ?  ?4.  Pt will report ability to watch sporting activity on television for >/= 10 minutes without increase in nausea/dizziness ?Baseline: nausea/dizziness with activity ?Goal status: INITIAL ?  ?  ?ASSESSMENT: ?  ?CLINICAL IMPRESSION: ?10th visit PN: Pt brought in his own blood pressure cuff from home and compared it to readings from manual readings today with pt's cuff reading higher. Discussed  having pt go home and seeing if he can recalibrate it for a more accurate reading (pt just got it recently). Pt did not meet LTG #3. With condition 4 of mCTSIB, pt only able to hold for 7 seconds today, at eval was 4-5 seconds. Remainder of session focused on progressing VOR x1 on compliant surfaces and balance with vision removed. Pt more challenged by balance with head nods, needing min A at times for safety. Will continue to progress towards LTGs.  ? ?  ?OBJECTIVE IMPAIRMENTS decreased activity tolerance, decreased balance, difficulty  walking, dizziness, impaired sensation, and pain.  ?  ?ACTIVITY LIMITATIONS community activity, driving, and yard work.  ?  ?PERSONAL FACTORS Age, Time since onset of injury/illness/exacerbation, and 3+ comorbidities: Anxiety, DM with Diabetic Neuropathy, DJD, HTN, GERD, Melanoma  are also affecting patient's functional outcome.  ?  ?  ?REHAB POTENTIAL: Good ?  ?CLINICAL DECISION MAKING: Stable/uncomplicated ?  ?EVALUATION COMPLEXITY: Low ?  ?  ?PLAN: ?PT FREQUENCY: 2x/week ?  ?PT DURATION: 6 weeks ?  ?PLANNED INTERVENTIONS: Therapeutic exercises, Therapeutic activity, Neuromuscular re-education, Balance training, Gait training, Patient/Family education, Joint manipulation, Joint mobilization, Stair training, Vestibular training, Canalith repositioning, Orthotic/Fit training, and DME instructions ?  ?PLAN FOR NEXT SESSION: Monitor BP. Complete SOT when able  - Continue VOR Progression; try with busy background . Cont with vestibular/balance exercises and exercises for visual motion sensitivity. ? ? ?Arliss Journey, PT, DPT  ?09/17/2021, 4:14 PM ? ? ?   ? ?

## 2021-09-19 ENCOUNTER — Ambulatory Visit: Payer: Medicare Other | Admitting: Physical Therapy

## 2021-09-19 ENCOUNTER — Encounter: Payer: Self-pay | Admitting: Physical Therapy

## 2021-09-19 VITALS — BP 149/84 | HR 99

## 2021-09-19 DIAGNOSIS — R42 Dizziness and giddiness: Secondary | ICD-10-CM | POA: Diagnosis not present

## 2021-09-19 DIAGNOSIS — R2681 Unsteadiness on feet: Secondary | ICD-10-CM

## 2021-09-19 NOTE — Therapy (Deleted)
? ?OUTPATIENT PHYSICAL THERAPY VESTIBULAR TREATMENT NOTE/10TH VISIT PROGRESS NOTE ? ?Patient Name: Phillip Rich ?MRN: [id number] ?DOB:[birth year], 71 y.o., male ?Today's Date: 09/19/2021 ? ?PCP: Lajean Manes, MD ?REFERRING PROVIDER: Lajean Manes, MD ? ?10th Visit Physical Therapy Progress Note ? ?Dates of Reporting Period: 08/08/21 to 09/17/21 ? ? ? ? ? ? ? ? ?Past Medical History:  ?Diagnosis Date  ? Anxiety   ? BPH (benign prostatic hyperplasia)   ? Complication of anesthesia   ? Diabetes mellitus   ? DJD (degenerative joint disease)   ? GERD (gastroesophageal reflux disease)   ? HTN (hypertension)   ? Hyperlipidemia   ? ILD (interstitial lung disease) ([place])   ? Left renal mass   ? Melanoma ([place])   ? Mild sleep apnea   ? PONV (postoperative nausea and vomiting)   ? ?Past Surgical History:  ?Procedure Laterality Date  ? COLONOSCOPY WITH PROPOFOL N/A 04/03/2014  ? Procedure: COLONOSCOPY WITH PROPOFOL;  Surgeon: Garlan Fair, MD;  [facility];  Service: Endoscopy;  Laterality: N/A;  ? KNEE ARTHROSCOPY Right   ? x2 right, x5 left  ? LASIK    ? MELANOMA EXCISION Left   ? NEPHRECTOMY Left 2022  ? partial  ? PROSTATE ABLATION    ? TOTAL KNEE ARTHROPLASTY    ? left  ? VASECTOMY    ? ?Patient Active Problem List  ? Diagnosis Date Noted  ? ILD (interstitial lung disease) ([place]) 12/19/2010  ? Tobacco abuse 12/19/2010  ? GERD (gastroesophageal reflux disease) 12/19/2010  ? Cough 10/27/2010  ? Anxiety 10/27/2010  ? SOB (shortness of breath) 10/27/2010  ? Lymph node enlargement 10/27/2010  ? ? ?ONSET DATE: 08/01/21 (Referral Date) ? ?REFERRING DIAG: Dizziness/Vertigo ? ?THERAPY DIAG:  ?No diagnosis found. ? ?PERTINENT HISTORY: Anxiety, DM with Diabetic Neuropathy, DJD, HTN, GERD, Melanoma ? ?PRECAUTIONS: Fall ? ?SUBJECTIVE:  Took his BP before he got here at it was 156/95. Brought in his own cuff from home.  ? ?PAIN:  ?Are you having pain? No ? ?Today's Vitals  ? 09/19/21 1407  ?BP: (!) 149/84  ?Pulse: 99   ? ?There is no height or weight on file to calculate BMI. ? ?TREATMENT: ? ?Self-Care: ?Pt brought in his own BP monitor from home and showed therapist how he set it up and took 2 measures. The first measure was 151/101 and the 2nd was 162/98. Therapist assessed with automatic cuff and was 168/89. Due to getting 3 different readings, PT re-assessed again with manual cuff and BP was 158/84. Pt did not have the instruction booklet with him to the session today. Discussed having pt look at the instruction booklet at home and see if there is a way to re-calibrate his BP cuff as it is reading higher than the other readings.  ? ? ?Standing Balance: ?Surface: Airex ?Position: Feet Hip Width Apart Wide Base of Support ?Completed with: Eyes Closed; with more narrow BOS ? ?Feet together on foam - 2 reps of 7 seconds each, with pt losing balance posteriorly into the wall. ? ?Performed with feet slightly apart for 3 x 30 seconds with EC.  ? ?Wide wider BOS on air ex ?Head Turns x 2 sets of 5 Reps and Head Nods x 3 sets of 5 Reps ?Pt needing min A  at times for balance with head nods.  ? ?At end of session, pt wishing to perform again, but with more narrow BOS x5 reps head turns and x5 reps head nods with pt  demonstrating improved balance.  ?  ?Gaze Adaptation: ?x1 Viewing Horizontal: Position: Standing with Feet together on Airex, Time: 2 x 30 sec, Comment: cues for continuous head motion and slightly incr speed. Mild dizziness when slightly incr speed.  ?  ?x1 Viewing Vertical: Position: Standing narrow BOS on Airex, Time: 2 x 30 sec Comment: cues for continuous head motion and slightly incr speed. Pt reporting up and down felt better.  ? ? ?PATIENT EDUCATION: ?Education details:  Trying to recalibrate his BP monitor for home as it kept reading higher than readings taken today. Progress towards goals.  ?Person educated: Patient ?Education method: Explanation ?Education comprehension: verbalized understanding ?  ?  HOME  EXERCISE PROGRAM:  ? Medbridge [id number] ? ?GOALS: ?Goals reviewed with patient? Yes ?  ?SHORT TERM GOALS: Target date: 09/06/2021 ?  ?Pt will be independent with initial HEP for improved balance  ?Baseline: no HEP established; reports independence with current HEP ?Goal status: MET ?  ?2.  Pt will be able to hold situation 2 of M-CTSIB for >/= 30 seconds to demo improved balance ?Baseline: 20 seconds ?Goal status: MET ?  ?3.  FGA TBA and LTG to be set as applicable     ?Baseline: 25/30 ?Goal status: MET ?  ?  ?LONG TERM GOALS: Target date: 10/04/2021 ?  ?Pt will be independent with final HEP for improved strengthening  ?Baseline: no HEP established ?Goal status: INITIAL ?  ?2.  Pt will improve FGA by 2 points from baseline to demonstrate improved balance and reduced fall risk ?Baseline: 25/30 ?Goal status: REVISED ?  ?3.  Pt will be able to hold situation 4 of M-CTSIB for >/= 15 seconds for improved balance          ?Baseline: 4-5 seconds; 7 seconds on 09/17/21 ?Goal status: ON-GOING ?  ?4.  Pt will report ability to watch sporting activity on television for >/= 10 minutes without increase in nausea/dizziness ?Baseline: nausea/dizziness with activity ?Goal status: INITIAL ?  ?  ?ASSESSMENT: ?  ?CLINICAL IMPRESSION: ?10th visit PN: Pt brought in his own blood pressure cuff from home and compared it to readings from manual readings today with pt's cuff reading higher. Discussed having pt go home and seeing if he can recalibrate it for a more accurate reading (pt just got it recently). Pt did not meet LTG #3. With condition 4 of mCTSIB, pt only able to hold for 7 seconds today, at eval was 4-5 seconds. Remainder of session focused on progressing VOR x1 on compliant surfaces and balance with vision removed. Pt more challenged by balance with head nods, needing min A at times for safety. Will continue to progress towards LTGs.  ? ?  ?OBJECTIVE IMPAIRMENTS decreased activity tolerance, decreased balance, difficulty walking,  dizziness, impaired sensation, and pain.  ?  ?ACTIVITY LIMITATIONS community activity, driving, and yard work.  ?  ?PERSONAL FACTORS Age, Time since onset of injury/illness/exacerbation, and 3+ comorbidities: Anxiety, DM with Diabetic Neuropathy, DJD, HTN, GERD, Melanoma  are also affecting patient's functional outcome.  ?  ?  ?REHAB POTENTIAL: Good ?  ?CLINICAL DECISION MAKING: Stable/uncomplicated ?  ?EVALUATION COMPLEXITY: Low ?  ?  ?PLAN: ?PT FREQUENCY: 2x/week ?  ?PT DURATION: 6 weeks ?  ?PLANNED INTERVENTIONS: Therapeutic exercises, Therapeutic activity, Neuromuscular re-education, Balance training, Gait training, Patient/Family education, Joint manipulation, Joint mobilization, Stair training, Vestibular training, Canalith repositioning, Orthotic/Fit training, and DME instructions ?  ?PLAN FOR NEXT SESSION: Monitor BP. Complete SOT when able  - Continue VOR Progression;  try with busy background . Cont with vestibular/balance exercises and exercises for visual motion sensitivity. ? ? ?PTYYPE, JYLTE IHDTPNS, PT, DPT  ?09/19/2021, 2:09 PM ? ? ?   ? ?

## 2021-09-19 NOTE — Therapy (Signed)
? ?OUTPATIENT PHYSICAL THERAPY VESTIBULAR TREATMENT NOTE ? ? ?Patient Name: Phillip Rich ?MRN: [id number] ?DOB:30-Aug-1950, 71 y.o., male ?Today's Date: 09/19/2021 ? ?PCP: Lajean Manes, MD ?REFERRING PROVIDER: Lajean Manes, MD ? ? PT End of Session - 09/19/21 1907   ? ? Visit Number 11   ? Number of Visits 13   ? Date for PT Re-Evaluation 10/04/21   pushed out due to delay in scheduling  ? Authorization Type [employer] Medicare   ? Authorization - Visit Number 10   ? Progress Note Due on Visit 10   ? PT Start Time 1401   ? PT Stop Time 4696   ? PT Time Calculation (min) 44 min   ? Activity Tolerance Patient tolerated treatment well   ? Behavior During Therapy [facility] for tasks assessed/performed   ? ?  ?  ? ?  ? ? ? ? ? ?Past Medical History:  ?Diagnosis Date  ? Anxiety   ? BPH (benign prostatic hyperplasia)   ? Complication of anesthesia   ? Diabetes mellitus   ? DJD (degenerative joint disease)   ? GERD (gastroesophageal reflux disease)   ? HTN (hypertension)   ? Hyperlipidemia   ? ILD (interstitial lung disease) ([place])   ? Left renal mass   ? Melanoma ([place])   ? Mild sleep apnea   ? PONV (postoperative nausea and vomiting)   ? ?Past Surgical History:  ?Procedure Laterality Date  ? COLONOSCOPY WITH PROPOFOL N/A 04/03/2014  ? Procedure: COLONOSCOPY WITH PROPOFOL;  Surgeon: Garlan Fair, MD;  Location: [facility];  Service: Endoscopy;  Laterality: N/A;  ? KNEE ARTHROSCOPY Right   ? x2 right, x5 left  ? LASIK    ? MELANOMA EXCISION Left   ? NEPHRECTOMY Left 2022  ? partial  ? PROSTATE ABLATION    ? TOTAL KNEE ARTHROPLASTY    ? left  ? VASECTOMY    ? ?Patient Active Problem List  ? Diagnosis Date Noted  ? ILD (interstitial lung disease) ([place]) 12/19/2010  ? Tobacco abuse 12/19/2010  ? GERD (gastroesophageal reflux disease) 12/19/2010  ? Cough 10/27/2010  ? Anxiety 10/27/2010  ? SOB (shortness of breath) 10/27/2010  ? Lymph node enlargement 10/27/2010  ? ? ?ONSET DATE: 08/01/21 (Referral Date) ? ?REFERRING DIAG:  Dizziness/Vertigo ? ?THERAPY DIAG:  ?Unsteadiness on feet ? ?Dizziness and giddiness ? ?PERTINENT HISTORY: Anxiety, DM with Diabetic Neuropathy, DJD, HTN, GERD, Melanoma ? ?PRECAUTIONS: Fall ? ?SUBJECTIVE:  Pt reports he has been prescribed an additional BP medication and will go this afternoon to pick it up; states he has been taking his BP at home with his machine; reports the nurse asked if he had smoked prior to having his BP taken - said that could increase his blood pressure; pt states he thinks he is going to quit smoking in near future ? ?PAIN:  ?Are you having pain? No ? ? ?Vitals:  ? 09/19/21 1407  ?BP: (!) 149/84  ?Pulse: 99  ? ?Neuro Re-ed: ? Standing Balance: ?Surface: pillowds ?Position: Narrow Base of Support Feet Hip Width Apart;  ?Completed with: Eyes Open and Eyes Closed; Head Turns x 10 Reps and Head Nods x 10 Reps, completed with narrow BOS with EO and head movement, moved to feet hip width with eyes closed. Increased challenge noted with eyes closed, and vertical > horizontal.  ? ? ?Rockerboard activity; pt stood on board inside // bars with UE support prn; performed 10 reps forward/back with EO with minimal UE support; performed  10 reps with EC with 2 finger support on each hand, progressed to finger support on 1 hand only with standing with EC; ?Pt reported mild onset of nausea after performing these exercises ? ? ?                  ? ?PATIENT EDUCATION: ?Education details:  Monitor BP; Take Medications as Prescribed.; no changes made to HEP on 09-19-21 ?Person educated: Patient ?Education method: Explanation ?Education comprehension: verbalized understanding ?  ?  HOME EXERCISE PROGRAM:  ? Medbridge [id number] ? ?GOALS: ?Goals reviewed with patient? Yes ?  ?SHORT TERM GOALS: Target date: 09/06/2021 ?  ?Pt will be independent with initial HEP for improved balance  ?Baseline: no HEP established; reports independence with current HEP ?Goal status: MET ?  ?2.  Pt will be able to hold situation 2 of  M-CTSIB for >/= 30 seconds to demo improved balance ?Baseline: 20 seconds ?Goal status: MET ?  ?3.  FGA TBA and LTG to be set as applicable     ?Baseline: 25/30 ?Goal status: MET ?  ?  ?LONG TERM GOALS: Target date: 10/04/2021 ?  ?Pt will be independent with final HEP for improved strengthening  ?Baseline: no HEP established ?Goal status: INITIAL ?  ?2.  Pt will improve FGA by 2 points from baseline to demonstrate improved balance and reduced fall risk ?Baseline: 25/30 ?Goal status: REVISED ?  ?3.  Pt will be able to hold situation 4 of M-CTSIB for >/= 15 seconds for improved balance          ?Baseline: 4-5 seconds ?Goal status: INITIAL ?  ?4.  Pt will report ability to watch sporting activity on television for >/= 10 minutes without increase in nausea/dizziness ?Baseline: nausea/dizziness with activity ?Goal status: INITIAL ?  ?  ?ASSESSMENT: ?  ?CLINICAL IMPRESSION: ?Pt is progressing towards LTG's; performance improved with maintaining balance on pillows with feet together with EC with pt able to maintain balance for approx. 10 secs on 3rd rep.  Pt has more difficulty maintaining balance with vertical head turns with EC than with horizontal head turns.  Pt reports no difficulty with performing x1 viewing exercise.  Cont with POC.  ? ?OBJECTIVE IMPAIRMENTS decreased activity tolerance, decreased balance, difficulty walking, dizziness, impaired sensation, and pain.  ?  ?ACTIVITY LIMITATIONS community activity, driving, and yard work.  ?  ?PERSONAL FACTORS Age, Time since onset of injury/illness/exacerbation, and 3+ comorbidities: Anxiety, DM with Diabetic Neuropathy, DJD, HTN, GERD, Melanoma  are also affecting patient's functional outcome.  ?  ?  ?REHAB POTENTIAL: Good ?  ?CLINICAL DECISION MAKING: Stable/uncomplicated ?  ?EVALUATION COMPLEXITY: Low ?  ?  ?PLAN: ?PT FREQUENCY: 2x/week ?  ?PT DURATION: 6 weeks ?  ?PLANNED INTERVENTIONS: Therapeutic exercises, Therapeutic activity, Neuromuscular re-education, Balance  training, Gait training, Patient/Family education, Joint manipulation, Joint mobilization, Stair training, Vestibular training, Canalith repositioning, Orthotic/Fit training, and DME instructions ?  ?PLAN FOR NEXT SESSION: Monitor BP. Complete SOT  - Continue VOR Progression; May can trial on foam. Cont with vestibular/balance exercises and exercises for visual motion sensitivity. ? ? ?SAYTKZ, SWFUX NATFTDD, PT ?09/19/2021, 7:10 PM ? ? ?   ? ?

## 2021-09-20 DIAGNOSIS — N390 Urinary tract infection, site not specified: Secondary | ICD-10-CM | POA: Diagnosis not present

## 2021-09-20 DIAGNOSIS — N138 Other obstructive and reflux uropathy: Secondary | ICD-10-CM | POA: Diagnosis not present

## 2021-09-20 DIAGNOSIS — C642 Malignant neoplasm of left kidney, except renal pelvis: Secondary | ICD-10-CM | POA: Diagnosis not present

## 2021-09-20 DIAGNOSIS — R3129 Other microscopic hematuria: Secondary | ICD-10-CM | POA: Diagnosis not present

## 2021-09-24 ENCOUNTER — Encounter: Payer: Medicare Other | Admitting: Physical Therapy

## 2021-09-24 DIAGNOSIS — Z01810 Encounter for preprocedural cardiovascular examination: Secondary | ICD-10-CM | POA: Diagnosis not present

## 2021-09-25 DIAGNOSIS — Z01810 Encounter for preprocedural cardiovascular examination: Secondary | ICD-10-CM | POA: Diagnosis not present

## 2021-09-25 DIAGNOSIS — I10 Essential (primary) hypertension: Secondary | ICD-10-CM | POA: Diagnosis not present

## 2021-09-25 DIAGNOSIS — Z79899 Other long term (current) drug therapy: Secondary | ICD-10-CM | POA: Diagnosis not present

## 2021-09-27 ENCOUNTER — Encounter: Payer: Medicare Other | Admitting: Physical Therapy

## 2021-09-27 DIAGNOSIS — E119 Type 2 diabetes mellitus without complications: Secondary | ICD-10-CM | POA: Diagnosis not present

## 2021-09-27 DIAGNOSIS — N138 Other obstructive and reflux uropathy: Secondary | ICD-10-CM | POA: Diagnosis not present

## 2021-09-27 DIAGNOSIS — Z794 Long term (current) use of insulin: Secondary | ICD-10-CM | POA: Diagnosis not present

## 2021-10-01 ENCOUNTER — Ambulatory Visit: Payer: Medicare Other | Admitting: Physical Therapy

## 2021-10-03 ENCOUNTER — Ambulatory Visit: Payer: Medicare Other

## 2021-10-10 ENCOUNTER — Ambulatory Visit
Admission: RE | Admit: 2021-10-10 | Discharge: 2021-10-10 | Disposition: A | Discharge status: Home / Self Care | Payer: Medicare Other | Source: Ambulatory Visit | Attending: Internal Medicine | Admitting: Internal Medicine

## 2021-10-10 ENCOUNTER — Other Ambulatory Visit: Payer: Self-pay | Admitting: Internal Medicine

## 2021-10-10 DIAGNOSIS — E1165 Type 2 diabetes mellitus with hyperglycemia: Secondary | ICD-10-CM | POA: Diagnosis not present

## 2021-10-10 DIAGNOSIS — I10 Essential (primary) hypertension: Secondary | ICD-10-CM | POA: Diagnosis not present

## 2021-10-10 DIAGNOSIS — E1122 Type 2 diabetes mellitus with diabetic chronic kidney disease: Secondary | ICD-10-CM | POA: Diagnosis not present

## 2021-10-10 DIAGNOSIS — N181 Chronic kidney disease, stage 1: Secondary | ICD-10-CM | POA: Diagnosis not present

## 2021-10-10 DIAGNOSIS — M25512 Pain in left shoulder: Secondary | ICD-10-CM | POA: Diagnosis not present

## 2021-10-10 DIAGNOSIS — Z794 Long term (current) use of insulin: Secondary | ICD-10-CM | POA: Diagnosis not present

## 2021-10-10 DIAGNOSIS — E1142 Type 2 diabetes mellitus with diabetic polyneuropathy: Secondary | ICD-10-CM | POA: Diagnosis not present

## 2021-10-10 DIAGNOSIS — Z87891 Personal history of nicotine dependence: Secondary | ICD-10-CM | POA: Diagnosis not present

## 2021-10-11 DIAGNOSIS — N138 Other obstructive and reflux uropathy: Secondary | ICD-10-CM | POA: Diagnosis not present

## 2021-10-11 DIAGNOSIS — R8290 Unspecified abnormal findings in urine: Secondary | ICD-10-CM | POA: Diagnosis not present

## 2021-10-11 DIAGNOSIS — C642 Malignant neoplasm of left kidney, except renal pelvis: Secondary | ICD-10-CM | POA: Diagnosis not present

## 2021-10-30 DIAGNOSIS — H2513 Age-related nuclear cataract, bilateral: Secondary | ICD-10-CM | POA: Diagnosis not present

## 2021-10-30 DIAGNOSIS — E119 Type 2 diabetes mellitus without complications: Secondary | ICD-10-CM | POA: Diagnosis not present

## 2021-10-30 DIAGNOSIS — H5203 Hypermetropia, bilateral: Secondary | ICD-10-CM | POA: Diagnosis not present

## 2021-11-08 DIAGNOSIS — N138 Other obstructive and reflux uropathy: Secondary | ICD-10-CM | POA: Diagnosis not present

## 2021-11-08 DIAGNOSIS — Z85528 Personal history of other malignant neoplasm of kidney: Secondary | ICD-10-CM | POA: Diagnosis not present

## 2021-12-03 DIAGNOSIS — E7800 Pure hypercholesterolemia, unspecified: Secondary | ICD-10-CM | POA: Diagnosis not present

## 2021-12-03 DIAGNOSIS — I10 Essential (primary) hypertension: Secondary | ICD-10-CM | POA: Diagnosis not present

## 2021-12-03 DIAGNOSIS — E1142 Type 2 diabetes mellitus with diabetic polyneuropathy: Secondary | ICD-10-CM | POA: Diagnosis not present

## 2021-12-03 DIAGNOSIS — K219 Gastro-esophageal reflux disease without esophagitis: Secondary | ICD-10-CM | POA: Diagnosis not present

## 2021-12-04 ENCOUNTER — Ambulatory Visit
Admission: RE | Admit: 2021-12-04 | Discharge: 2021-12-04 | Disposition: A | Discharge status: Home / Self Care | Payer: Medicare Other | Source: Ambulatory Visit | Attending: Geriatric Medicine | Admitting: Geriatric Medicine

## 2021-12-04 ENCOUNTER — Other Ambulatory Visit: Payer: Self-pay | Admitting: Geriatric Medicine

## 2021-12-04 DIAGNOSIS — R0600 Dyspnea, unspecified: Secondary | ICD-10-CM | POA: Diagnosis not present

## 2021-12-04 DIAGNOSIS — R0981 Nasal congestion: Secondary | ICD-10-CM | POA: Diagnosis not present

## 2021-12-04 DIAGNOSIS — J8410 Pulmonary fibrosis, unspecified: Secondary | ICD-10-CM

## 2021-12-04 DIAGNOSIS — R300 Dysuria: Secondary | ICD-10-CM | POA: Diagnosis not present

## 2021-12-04 DIAGNOSIS — R059 Cough, unspecified: Secondary | ICD-10-CM | POA: Diagnosis not present

## 2021-12-04 DIAGNOSIS — I10 Essential (primary) hypertension: Secondary | ICD-10-CM | POA: Diagnosis not present

## 2021-12-17 ENCOUNTER — Ambulatory Visit: Payer: Medicare Other | Admitting: Psychiatry

## 2021-12-17 VITALS — BP 152/84 | HR 103 | Ht 70.0 in | Wt 192.1 lb

## 2021-12-17 DIAGNOSIS — G4730 Sleep apnea, unspecified: Secondary | ICD-10-CM | POA: Diagnosis not present

## 2021-12-17 DIAGNOSIS — R2689 Other abnormalities of gait and mobility: Secondary | ICD-10-CM | POA: Diagnosis not present

## 2021-12-17 NOTE — Progress Notes (Signed)
   CC:  imbalance, dizziness  Follow-up Visit  Last visit: 07/30/21  Brief HPI: 71 year old male with a history of smoking, HTN, diabetes c/b diabetic neuropathy, HLD who follows in clinic for imbalance and dizziness.  At his last visit exam was suggestive of peripheral neuropathy. He was referred to vestibular therapy to help with his dizziness.  Interval History: Vestibular therapy has helped with his balance. He has not felt off balance since completing therapy.  He has been having issues with sleeping lately.  Falls asleep during the day then does not sleep at night. He does snore at night.Had a PSG 20 years ago and was diagnosed with moderate sleep apnea but never used a CPAP.   Feels his head is "stopped up" and has started taking daily nasal sprays, which he thinks may be helping. Denies headaches.  Physical Exam:   Vital Signs: BP (!) 152/84   Pulse (!) 103   Ht '5\' 10"'$  (1.778 m)   Wt 192 lb 2 oz (87.1 kg)   BMI 27.57 kg/m  GENERAL:  well appearing, in no acute distress, alert  SKIN:  Color, texture, turgor normal. No rashes or lesions HEAD:  Normocephalic/atraumatic. RESP: normal respiratory effort MSK:  No gross joint deformities.   NEUROLOGICAL: Mental Status: Alert, oriented to person, place and time, Follows commands, and Speech fluent and appropriate. Cranial Nerves: PERRL, face symmetric, no dysarthria, hearing grossly intact Motor: moves all extremities equally Gait: normal-based.  IMPRESSION: 71 year old male with a history of smoking, HTN, diabetes c/b diabetic neuropathy, HLD who presents for follow up of dizziness and imbalance. His symptoms have improved with vestibular therapy. His primary concern today is significant daytime fatigue and snoring at bedtime. Had a PSG 20 years ago which showed moderate OSA, never had a CPAP. Would like to be re-evaluated to see if he would benefit from CPAP now. Referral to Sleep placed for OSA  evaluation.  PLAN: -Referral to Sleep for OSA evaluation   Follow-up: as needed  I spent a total of 21 minutes on the date of the service. Discussed medication side effects, adverse reactions and drug interactions. Written educational materials and patient instructions outlining all of the above were given.  Genia Harold, MD 12/17/21 4:02 PM

## 2022-01-01 DIAGNOSIS — I10 Essential (primary) hypertension: Secondary | ICD-10-CM | POA: Diagnosis not present

## 2022-01-01 DIAGNOSIS — F5101 Primary insomnia: Secondary | ICD-10-CM | POA: Diagnosis not present

## 2022-01-01 DIAGNOSIS — J8410 Pulmonary fibrosis, unspecified: Secondary | ICD-10-CM | POA: Diagnosis not present

## 2022-01-05 ENCOUNTER — Other Ambulatory Visit: Payer: Self-pay

## 2022-01-05 ENCOUNTER — Encounter (HOSPITAL_BASED_OUTPATIENT_CLINIC_OR_DEPARTMENT_OTHER): Payer: Self-pay | Admitting: Emergency Medicine

## 2022-01-05 ENCOUNTER — Emergency Department (HOSPITAL_BASED_OUTPATIENT_CLINIC_OR_DEPARTMENT_OTHER): Payer: Medicare Other | Admitting: Radiology

## 2022-01-05 ENCOUNTER — Emergency Department (HOSPITAL_BASED_OUTPATIENT_CLINIC_OR_DEPARTMENT_OTHER)
Admission: EM | Admit: 2022-01-05 | Discharge: 2022-01-05 | Discharge status: Against Medical Advice | Payer: Medicare Other | Attending: Emergency Medicine | Admitting: Emergency Medicine

## 2022-01-05 ENCOUNTER — Emergency Department (HOSPITAL_BASED_OUTPATIENT_CLINIC_OR_DEPARTMENT_OTHER): Payer: Medicare Other

## 2022-01-05 DIAGNOSIS — J90 Pleural effusion, not elsewhere classified: Secondary | ICD-10-CM | POA: Diagnosis not present

## 2022-01-05 DIAGNOSIS — Z7982 Long term (current) use of aspirin: Secondary | ICD-10-CM | POA: Insufficient documentation

## 2022-01-05 DIAGNOSIS — J441 Chronic obstructive pulmonary disease with (acute) exacerbation: Secondary | ICD-10-CM | POA: Diagnosis not present

## 2022-01-05 DIAGNOSIS — Z7984 Long term (current) use of oral hypoglycemic drugs: Secondary | ICD-10-CM | POA: Diagnosis not present

## 2022-01-05 DIAGNOSIS — R9431 Abnormal electrocardiogram [ECG] [EKG]: Secondary | ICD-10-CM | POA: Diagnosis not present

## 2022-01-05 DIAGNOSIS — R778 Other specified abnormalities of plasma proteins: Secondary | ICD-10-CM | POA: Diagnosis not present

## 2022-01-05 DIAGNOSIS — E119 Type 2 diabetes mellitus without complications: Secondary | ICD-10-CM | POA: Diagnosis not present

## 2022-01-05 DIAGNOSIS — Z7901 Long term (current) use of anticoagulants: Secondary | ICD-10-CM | POA: Diagnosis not present

## 2022-01-05 DIAGNOSIS — Z7951 Long term (current) use of inhaled steroids: Secondary | ICD-10-CM | POA: Insufficient documentation

## 2022-01-05 DIAGNOSIS — F17200 Nicotine dependence, unspecified, uncomplicated: Secondary | ICD-10-CM | POA: Diagnosis not present

## 2022-01-05 DIAGNOSIS — Z794 Long term (current) use of insulin: Secondary | ICD-10-CM | POA: Insufficient documentation

## 2022-01-05 DIAGNOSIS — I2694 Multiple subsegmental pulmonary emboli without acute cor pulmonale: Secondary | ICD-10-CM | POA: Insufficient documentation

## 2022-01-05 DIAGNOSIS — I2699 Other pulmonary embolism without acute cor pulmonale: Secondary | ICD-10-CM | POA: Diagnosis not present

## 2022-01-05 DIAGNOSIS — Z20822 Contact with and (suspected) exposure to covid-19: Secondary | ICD-10-CM | POA: Diagnosis not present

## 2022-01-05 DIAGNOSIS — Z79899 Other long term (current) drug therapy: Secondary | ICD-10-CM | POA: Diagnosis not present

## 2022-01-05 DIAGNOSIS — I10 Essential (primary) hypertension: Secondary | ICD-10-CM | POA: Diagnosis not present

## 2022-01-05 DIAGNOSIS — R0602 Shortness of breath: Secondary | ICD-10-CM | POA: Diagnosis not present

## 2022-01-05 LAB — CBC
HCT: 30.9 % — ABNORMAL LOW (ref 39.0–52.0)
Hemoglobin: 10.3 g/dL — ABNORMAL LOW (ref 13.0–17.0)
MCH: 29.9 pg (ref 26.0–34.0)
MCHC: 33.3 g/dL (ref 30.0–36.0)
MCV: 89.8 fL (ref 80.0–100.0)
Platelets: 293 10*3/uL (ref 150–400)
RBC: 3.44 MIL/uL — ABNORMAL LOW (ref 4.22–5.81)
RDW: 14.9 % (ref 11.5–15.5)
WBC: 10.2 10*3/uL (ref 4.0–10.5)
nRBC: 0 % (ref 0.0–0.2)

## 2022-01-05 LAB — BASIC METABOLIC PANEL
Anion gap: 10 (ref 5–15)
BUN: 23 mg/dL (ref 8–23)
CO2: 22 mmol/L (ref 22–32)
Calcium: 8.9 mg/dL (ref 8.9–10.3)
Chloride: 102 mmol/L (ref 98–111)
Creatinine, Ser: 0.91 mg/dL (ref 0.61–1.24)
GFR, Estimated: >60 mL/min (ref >60)
Glucose, Bld: 255 mg/dL — ABNORMAL HIGH (ref 70–99)
Potassium: 4.2 mmol/L (ref 3.5–5.1)
Sodium: 134 mmol/L — ABNORMAL LOW (ref 135–145)

## 2022-01-05 LAB — RESP PANEL BY RT-PCR (FLU A&B, COVID) ARPGX2
Influenza A by PCR: NEGATIVE
Influenza B by PCR: NEGATIVE
SARS Coronavirus 2 by RT PCR: NEGATIVE

## 2022-01-05 LAB — TROPONIN I (HIGH SENSITIVITY): Troponin I (High Sensitivity): 81 ng/L — ABNORMAL HIGH (ref <18)

## 2022-01-05 LAB — D-DIMER, QUANTITATIVE: D-Dimer, Quant: 1.3 ug/mL-FEU — ABNORMAL HIGH (ref 0.00–0.50)

## 2022-01-05 LAB — BRAIN NATRIURETIC PEPTIDE: B Natriuretic Peptide: 1345 pg/mL — ABNORMAL HIGH (ref 0.0–100.0)

## 2022-01-05 MED ORDER — ALBUTEROL SULFATE HFA 108 (90 BASE) MCG/ACT IN AERS: 1.0000 | INHALATION_SPRAY | Freq: Four times a day (QID) | RESPIRATORY_TRACT | 0 refills | Status: DC | PRN

## 2022-01-05 MED ORDER — APIXABAN (ELIQUIS) VTE STARTER PACK (10MG AND 5MG): ORAL_TABLET | ORAL | 0 refills | Status: DC

## 2022-01-05 MED ORDER — ALBUTEROL SULFATE HFA 108 (90 BASE) MCG/ACT IN AERS
2.0000 | INHALATION_SPRAY | RESPIRATORY_TRACT | Status: DC | PRN
  Administered 2022-01-05: 2 via RESPIRATORY_TRACT
  Filled 2022-01-05: qty 6.7

## 2022-01-05 MED ORDER — IPRATROPIUM-ALBUTEROL 0.5-2.5 (3) MG/3ML IN SOLN
3.0000 mL | Freq: Once | RESPIRATORY_TRACT | Status: AC
Start: 2022-01-05 — End: 2022-01-05
  Administered 2022-01-05: 3 mL via RESPIRATORY_TRACT
  Filled 2022-01-05: qty 3

## 2022-01-05 MED ORDER — IOHEXOL 350 MG/ML SOLN
100.0000 mL | Freq: Once | INTRAVENOUS | Status: AC | PRN
  Administered 2022-01-05: 80 mL via INTRAVENOUS

## 2022-01-05 MED ORDER — PREDNISONE 10 MG PO TABS: 40.0000 mg | ORAL_TABLET | Freq: Every day | ORAL | 0 refills | Status: DC

## 2022-01-05 MED ORDER — PREDNISONE 10 MG PO TABS
60.0000 mg | ORAL_TABLET | Freq: Once | ORAL | Status: AC
  Administered 2022-01-05: 60 mg via ORAL
  Filled 2022-01-05: qty 1

## 2022-01-05 NOTE — Care Management (Signed)
Transition of Care [facility]) - Emergency Department Mini Assessment   Patient Details  Name: Phillip Rich MRN: [id number] Date of Birth: 1951-03-15  Transition of Care [facility]) CM/SW Contact:    Roseanne Kaufman, RN Phone Number: 01/05/2022, 2:53 PM   Clinical Narrative:  RNCM received TOC consult for cost estimate for medications. Per [facility] on [facility]  30 day supply of Xarelto 20 mg $149.97 and/or  30 day supply of Eliquis 5 mg $77.63.  EDP notified.   No additional TOC needs at this time.   ED Mini Assessment: What brought you to the Emergency Department? : shortness of breath  Barriers to Discharge: No Barriers Identified        Interventions which prevented an admission or readmission: Medication Review    Patient Contact and Communications        ,                 Admission diagnosis:  shortness of breath Patient Active Problem List   Diagnosis Date Noted   ILD (interstitial lung disease) ([place]) 12/19/2010   Tobacco abuse 12/19/2010   GERD (gastroesophageal reflux disease) 12/19/2010   Cough 10/27/2010   Anxiety 10/27/2010   SOB (shortness of breath) 10/27/2010   Lymph node enlargement 10/27/2010   PCP:  Lajean Manes, MD Pharmacy[facility]   [id number] [place], [place] [address]Phone:: [phone]:: [phone] [facility] [place] [address]Phone: [phone]: [phone]

## 2022-01-05 NOTE — ED Notes (Signed)
RT note: Peak Flows obtained per order with following results:(*First PF done after initial 2 puffs Albuterol inhaler given @ 1011 Pre:*'@1040'$  = 130 lpm Mid:'@1110'$  = 200 lpm Post:'@1130'$  = 250 lpm Pt. tolerated well with great effort, "breathing easier after Duoneb given/completed."

## 2022-01-05 NOTE — ED Notes (Signed)
Patient awaiting instructions for home---does not want to be admitted and provider is aware and developing plan of care for home.

## 2022-01-05 NOTE — ED Triage Notes (Signed)
Short of breath for few weeks, wasn't able to walk very far without being short of breath. Now patient is short of breath at rest. HIs primary would not give him an inhaler ,wanted him to see pulmonary, but pulmonary cant see til October.

## 2022-01-05 NOTE — ED Notes (Signed)
RT note: Pt. seen in Triage -1 with RT assessment obtained/charted, is here for >'d S.O.B. at rest and worse  with activity, has seen Pulmonary, CCM-Dr. Lynford Citizen in past with ?ILD, is currently smoker, RT to monitor.

## 2022-01-05 NOTE — Discharge Instructions (Addendum)
You were seen in the emergency department for your shortness of breath.  You did have wheezing on your exam and may be developing COPD or emphysema.  We treated you with steroids and an inhaler and you should continue to complete the course of steroids and the inhaler at home.  Your work-up also showed that you have a blood clot in your lungs which is likely also contributing to your shortness of breath.  Your heart enzymes were elevated concerning for some stress on your heart from the blood clot and we offered you to have admission for further management of the blood clot but you chose to leave [place].  I have given you prescription for a blood thinner and you should start this immediately.  You should follow-up with your primary doctor within the next 24 hours to have your symptoms rechecked.  You can return to the emergency department at any time if you have any changes or if you would like to be admitted for further treatment.  You should additionally return to the emergency department if you are having worsening chest pain or shortness of breath, you pass out, or if you have any other new or concerning symptoms.

## 2022-01-05 NOTE — ED Notes (Signed)
RT note: Pt. was administered 2 puffs Albuterol inhaler with spacer device.

## 2022-01-05 NOTE — ED Notes (Signed)
Patient reports decreased shortness of breath after breathing tx. States still feels mildly short of breath with sitting. Patient is sitting on edge of bed for comfort. Denies any specific pain at this time. Patient remains on monitor.

## 2022-01-05 NOTE — ED Notes (Signed)
During instructions stress important of filling prescriptions and calling pulmonary and PCP tomorrow morning, gave strict guidelines for return. Patient verbalized understanding and did sign AMA.

## 2022-01-05 NOTE — ED Notes (Signed)
Patient remains sitting on edge of bed for comfort. States he would like to go home but encouraged patient to wait while we figure out next steps. Patient remains pleasant with no needs at this time. Remains short of breath at rest with increased HR and RR.

## 2022-01-05 NOTE — ED Notes (Signed)
Provider at bedside

## 2022-01-05 NOTE — ED Provider Notes (Signed)
[facility] EMERGENCY DEPT Provider Note   CSN: [id number] Arrival date & time: 01/05/22  0347     History  Chief Complaint  Patient presents with   Shortness of Breath    Phillip Rich is a 71 y.o. male.  Patient is a 71 year old male with a past medical history of hypertension, diabetes, GERD, and interstitial lung disease likely due to tobacco use presenting to the emergency department for shortness of breath.  The patient states that he has had increasing shortness of breath on exertion over the last few months.  He states that he got back from a trip to [facility] on Monday and his shortness of breath is only worsened since then.  He states that he has had an associated cough without significant mucus production.  He denies any fevers or chills.  He denies any lower extremity swelling.  He denies any chest pain, nausea or vomiting.  He denies any history of blood clots, he states that he flew to [facility] and otherwise has had no recent long car rides, no recent hospitalizations or surgeries, no cancer history and no hormone use.  The history is provided by the patient.  Shortness of Breath      Home Medications Prior to Admission medications   Medication Sig Start Date End Date Taking? Authorizing Provider  albuterol (VENTOLIN HFA) 108 (90 Base) MCG/ACT inhaler Inhale 1-2 puffs into the lungs every 6 (six) hours as needed for wheezing or shortness of breath. 01/05/22  Yes Sohum Delillo, [place] K, DO  APIXABAN Arne Cleveland) VTE STARTER PACK ('10MG'$  AND '5MG'$ ) Take as directed on package: start with two-'5mg'$  tablets twice daily for 7 days. On day 8, switch to one-'5mg'$  tablet twice daily. 01/05/22  Yes Alyson Ki, Norman Herrlich, DO  DULoxetine (CYMBALTA) 60 MG capsule Take 60 mg by mouth daily. 05/28/21  Yes [provider]  insulin lispro (HUMALOG) 100 UNIT/ML injection Inject 12 Units into the skin 2 (two) times daily with a meal.   Yes [provider]   metFORMIN (GLUCOPHAGE) 1000 MG tablet Take 1,000 mg by mouth 2 (two) times daily.  03/06/10  Yes [provider]  omeprazole (PRILOSEC) 20 MG capsule Take 1 tablet by mouth every morning.  08/06/10  Yes [provider]  predniSONE (DELTASONE) 10 MG tablet Take 4 tablets (40 mg total) by mouth daily for 4 days. 01/05/22 01/09/22 Yes [place] Depaz, [name] Hawks K, DO  simvastatin (ZOCOR) 20 MG tablet Take 1 tablet by mouth every morning.  09/11/10  Yes [provider]  TRULICITY 4.25 ZD/6.3OV SOPN Inject into the skin. 07/03/21  Yes [provider]  ALPHA LIPOIC ACID PO Take 1 tablet by mouth every morning.    [provider]  aspirin 81 MG tablet Take 81 mg by mouth at bedtime.     [provider]  cetirizine (ZYRTEC) 10 MG tablet Take 10 mg by mouth every morning.     [provider]  Cholecalciferol (VITAMIN D PO) Take 1 tablet by mouth every morning.    [provider]  glimepiride (AMARYL) 4 MG tablet Take 1 tablet by mouth Twice daily. 08/06/10   [provider]  insulin NPH-regular Human (NOVOLIN 70/30) (70-30) 100 UNIT/ML injection Inject 50 Units into the skin 2 (two) times daily.     [provider]  ipratropium (ATROVENT) 0.03 % nasal spray Place 2 sprays into the nose 2 (two) times daily. 07/02/11 03/16/14  Harrison Mons, PA  losartan (COZAAR) 25 MG tablet  Take 1 tablet (25 mg total) by mouth daily. Please HOLD rx  Until patient calls for refill. Patient taking differently: Take 25 mg by mouth every morning. 12/19/10   Brand Males, MD  Multiple Vitamin (MULTIVITAMIN WITH MINERALS) TABS tablet Take 1 tablet by mouth every morning.    [provider]  pioglitazone (ACTOS) 30 MG tablet Take 30 mg by mouth every morning.     [provider]  promethazine (PHENERGAN) 12.5 MG tablet Take 1 tablet by mouth Every 6 hours as needed for nausea or vomiting.  10/09/10   [provider]       Allergies    Codeine, Crestor [rosuvastatin calcium], and Erythromycin    Review of Systems   Review of Systems  Respiratory:  Positive for shortness of breath.     Physical Exam Updated Vital Signs BP (!) 141/95 (BP Location: Left Arm)   Pulse (!) 111   Temp 97.8 F (36.6 C)   Resp (!) 24   SpO2 99%  Physical Exam Vitals and nursing note reviewed.  Constitutional:      General: He is not in acute distress.    Appearance: He is well-developed.  HENT:     Head: Normocephalic and atraumatic.     Mouth/Throat:     Mouth: Mucous membranes are moist.     Pharynx: Oropharynx is clear.  Eyes:     Extraocular Movements: Extraocular movements intact.     Pupils: Pupils are equal, round, and reactive to light.  Cardiovascular:     Rate and Rhythm: Regular rhythm. Tachycardia present.     Pulses: Normal pulses.  Pulmonary:     Effort: Tachypnea (Mildly conversationally tachypneic and dyspneic) present. No accessory muscle usage or respiratory distress.     Breath sounds: Wheezing (Expiratory, throughout) present.  Abdominal:     Palpations: Abdomen is soft.     Tenderness: There is no abdominal tenderness.  Musculoskeletal:        General: Normal range of motion.     Cervical back: Normal range of motion and neck supple.     Right lower leg: No edema.     Left lower leg: No edema.  Skin:    General: Skin is warm and dry.  Neurological:     General: No focal deficit present.     Mental Status: He is alert and oriented to person, place, and time.  Psychiatric:        Mood and Affect: Mood normal.        Behavior: Behavior normal.     ED Results / Procedures / Treatments   Labs (all labs ordered are listed, but only abnormal results are displayed) Labs Reviewed  CBC - Abnormal; Notable for the following components:      Result Value   RBC 3.44 (*)    Hemoglobin 10.3 (*)    HCT 30.9 (*)    All other components within normal limits  BASIC METABOLIC PANEL -  Abnormal; Notable for the following components:   Sodium 134 (*)    Glucose, Bld 255 (*)    All other components within normal limits  D-DIMER, QUANTITATIVE - Abnormal; Notable for the following components:   D-Dimer, Quant 1.30 (*)    All other components within normal limits  BRAIN NATRIURETIC PEPTIDE - Abnormal; Notable for the following components:   B Natriuretic Peptide 1,345.0 (*)    All other components within normal limits  TROPONIN I (HIGH SENSITIVITY) - Abnormal; Notable for the  following components:   Troponin I (High Sensitivity) 81 (*)    All other components within normal limits  RESP PANEL BY RT-PCR (FLU A&B, COVID) [id number]  TROPONIN I (HIGH SENSITIVITY)    EKG None  Radiology CT Angio Chest PE W and/or Wo Contrast  Addendum Date: 01/05/2022   ADDENDUM REPORT: 01/05/2022 11:38 ADDENDUM: These results were discussed by telephone on 01/05/2022 at 11:38 am with provider Oneal Deputy , who verbally acknowledged these results. Electronically Signed   By: Davina Poke D.O.   On: 01/05/2022 11:38   Result Date: 01/05/2022 CLINICAL DATA:  Pulmonary embolism (PE) suspected, positive D-dimer EXAM: CT ANGIOGRAPHY CHEST WITH CONTRAST TECHNIQUE: Multidetector CT imaging of the chest was performed using the standard protocol during bolus administration of intravenous contrast. Multiplanar CT image reconstructions and MIPs were obtained to evaluate the vascular anatomy. RADIATION DOSE REDUCTION: This exam was performed according to the departmental dose-optimization program which includes automated exposure control, adjustment of the mA and/or kV according to patient size and/or use of iterative reconstruction technique. CONTRAST:  47m OMNIPAQUE IOHEXOL 350 MG/ML SOLN COMPARISON:  CT 07/10/2021 FINDINGS: Cardiovascular: Satisfactory opacification of the pulmonary arteries. Filling defects are seen involving lobar and segmental branch pulmonary arteries of the right lower lobe as well  as segmental branch arteries of the right middle lobe (series 6, images 184-205). There is some heterogeneity in the distal right main pulmonary artery which may be secondary to turbulent flow. No saddle embolism. Possible distal left basilar PEs, although evaluation is limited by respiratory motion artifact in the lung bases. RV to LV ratio of less than 1.0. Thoracic aorta is nonaneurysmal. Scattered atherosclerotic vascular calcifications of the aorta and coronary arteries. Heart size upper limits of normal. No pericardial effusion. Mediastinum/Nodes: Abnormally enlarged right hilar lymph node measuring 2.6 cm short axis (series 5, image 77). Enlarged mediastinal lymph nodes including 1.5 cm right paratracheal, 1.6 cm precarinal, and 1.8 cm subcarinal nodes. No axillary lymphadenopathy. Thyroid, trachea, and esophagus within normal limits. Small hiatal hernia. Lungs/Pleura: Moderate layering bilateral pleural effusions. Background of interstitial lung disease with fibrosis. Diffuse bronchial wall thickening with areas of bronchiectasis. Interlobular septal thickening bilaterally. No pneumothorax. Upper Abdomen: No acute abnormality. Musculoskeletal: No chest wall abnormality. No acute or significant osseous findings. Review of the MIP images confirms the above findings. IMPRESSION: 1. Positive for acute pulmonary embolism involving lobar and segmental branch pulmonary arteries of the right lower lobe as well as segmental branch arteries of the right middle lobe. There is some heterogeneity in the distal right main pulmonary artery which may be secondary to turbulent flow. No saddle embolism. Possible distal left basilar PEs, although evaluation is limited by respiratory motion artifact in the lung bases. No evidence of right heart strain. 2. Moderate layering bilateral pleural effusions with interlobular septal thickening bilaterally, suggesting pulmonary edema. 3. Background of interstitial lung disease with  fibrosis. 4. Mediastinal and right hilar lymphadenopathy, nonspecific but may be reactive and/or secondary to underlying interstitial lung disease. Short-term follow-up CT of the chest following resolution of patient's acute symptoms is recommended, preferably with IV contrast. 5. Aortic and coronary artery atherosclerosis (ICD10-I70.0). Ordering provider has been paged. Documentation of the communication of the above findings will be added to the report in the form of an addendum. Electronically Signed: By: NDavina PokeD.O. On: 01/05/2022 11:29   DG Chest 2 View  Result Date: 01/05/2022 CLINICAL DATA:  Shortness of breath for several weeks. History of pulmonary fibrosis. EXAM: CHEST -  2 VIEW COMPARISON:  12/04/2021 and prior studies FINDINGS: Cardiomediastinal silhouette is within normal limits. Bibasilar opacities and trace effusions are noted. Bilateral interstitial opacities are minimally increased. There is no evidence of pneumothorax or acute bony abnormality. IMPRESSION: 1. Slightly increased bilateral interstitial opacities, which may represent mild edema on chronic lung disease/fibrosis. Trace bilateral pleural effusions. 2. Bibasilar opacities which may represent more focal edema, atelectasis or less likely infection. Electronically Signed   By: Margarette Canada M.D.   On: 01/05/2022 09:45    Procedures Procedures    Medications Ordered in ED Medications  albuterol (VENTOLIN HFA) 108 (90 Base) MCG/ACT inhaler 2 puff (2 puffs Inhalation Given 01/05/22 1011)  predniSONE (DELTASONE) tablet 60 mg (60 mg Oral Given 01/05/22 1206)  ipratropium-albuterol (DUONEB) 0.5-2.5 (3) MG/3ML nebulizer solution 3 mL (3 mLs Nebulization Given 01/05/22 1053)  iohexol (OMNIPAQUE) 350 MG/ML injection 100 mL (80 mLs Intravenous Contrast Given 01/05/22 1100)    ED Course/ Medical Decision Making/ A&P Clinical Course as of 01/05/22 1549  Sun Jan 05, 2022  1501 Patient's CTPE did show evidence of PE without right  heart strain. Troponin is elevated in addition to BNP concerning for mild heart strain. The patient remains tachycardic but otherwise hemodynamically stable. He was recommended admission for his PE. The patient has decided to leave against medical advice.  The patient had a normal mental status examination and understands his condition and the risks of leaving including worsening clot burden from the blood clot that could lead to heart failure, heart attack and death. The patient has had an opportunity to ask  questions about his medical condition. The patient has been informed  that he may return for care at any time, was given prescriptions for anticoagulation as well as medications for COPD exacerbation and has been referred to his/her   physician.   [VK]    Clinical Course User Index [VK] Ottie Glazier, DO                           Medical Decision Making This patient presents to the ED with chief complaint(s) of shortness of breath with pertinent past medical history of hypertension, diabetes, interstitial lung disease, tobacco use which further complicates the presenting complaint. The complaint involves an extensive differential diagnosis and also carries with it a high risk of complications and morbidity.    The differential diagnosis includes patient does have wheezing on exam, concerning for COPD exacerbation.  Does not appear significantly volume overload but due to his increasing dyspnea on exertion and subjective swelling of his feet, concern for possible new onset CHF with exacerbation, ACS, patient is mildly tachycardic but otherwise has no risk factors for PE, he was however screened with a D-dimer in triage that was elevated making PE possible cause of his shortness of breath, considering, pneumonia, pulmonary edema, pleural effusion  Additional history obtained: Additional history obtained from N/A Records reviewed Primary Care Documents  ED Course and  Reassessment: Patient was initially evaluated by respiratory in triage and did have 1 albuterol treatment.  The patient reported some improvement and had increasing wheeze after his treatment, making likely that the patient's lungs are starting to open up.  He will be given additional DuoNeb and steroids for suspected COPD/emphysema exacerbation.  Due to patient's elevated D-dimer, CT PE will be performed to evaluate for PE.  EKG had no acute ischemic changes however troponin and BNP will be performed to evaluate for volume  overload or signs of right heart strain should the patient have a PE.  Independent labs interpretation:  The following labs were independently interpreted: D-dimer elevated, elevated troponin and BNP  Independent visualization of imaging: - I independently visualized the following imaging with scope of interpretation limited to determining acute life threatening conditions related to emergency care: Chest x-ray, CT PE, which revealed chest x-ray with findings of interstitial lung disease, CT PE is positive for PE  Consultation: - Consulted or discussed management/test interpretation w/ external professional: Care management and assistance with prescriptions  Consideration for admission or further workup: Patient was offered admission for his PEs in the setting of elevated troponin and BNP.  The patient is refusing admission at this time and is going to leave [facility].  He had extensive conversations with myself regarding the risks of PE including heart failure and death and he understands these risks.  The patient is not intoxicated appearing and is under no duress to leave.  He was encouraged to return to the emergency department should he change his mind or if anything changes with his symptoms. Social Determinants of health: N/A    Amount and/or Complexity of Data Reviewed Labs: ordered. Radiology: ordered.  Risk Prescription drug  management.           Final Clinical Impression(s) / ED Diagnoses Final diagnoses:  Multiple subsegmental pulmonary emboli without acute cor pulmonale (HCC)  Elevated troponin  COPD exacerbation (HCC)    Rx / DC Orders ED Discharge Orders          Ordered    APIXABAN (ELIQUIS) VTE STARTER PACK ('10MG'$  AND '5MG'$ )        01/05/22 1458    predniSONE (DELTASONE) 10 MG tablet  Daily        01/05/22 1458    albuterol (VENTOLIN HFA) 108 (90 Base) MCG/ACT inhaler  Every 6 hours PRN        01/05/22 [place], [place], DO 01/05/22 1549

## 2022-01-07 DIAGNOSIS — R0609 Other forms of dyspnea: Secondary | ICD-10-CM | POA: Diagnosis not present

## 2022-01-07 DIAGNOSIS — J8410 Pulmonary fibrosis, unspecified: Secondary | ICD-10-CM | POA: Diagnosis not present

## 2022-01-07 DIAGNOSIS — K219 Gastro-esophageal reflux disease without esophagitis: Secondary | ICD-10-CM | POA: Diagnosis not present

## 2022-01-07 DIAGNOSIS — I10 Essential (primary) hypertension: Secondary | ICD-10-CM | POA: Diagnosis not present

## 2022-01-07 DIAGNOSIS — E1142 Type 2 diabetes mellitus with diabetic polyneuropathy: Secondary | ICD-10-CM | POA: Diagnosis not present

## 2022-01-07 DIAGNOSIS — N181 Chronic kidney disease, stage 1: Secondary | ICD-10-CM | POA: Diagnosis not present

## 2022-01-07 DIAGNOSIS — I2693 Single subsegmental pulmonary embolism without acute cor pulmonale: Secondary | ICD-10-CM | POA: Diagnosis not present

## 2022-01-07 DIAGNOSIS — E7800 Pure hypercholesterolemia, unspecified: Secondary | ICD-10-CM | POA: Diagnosis not present

## 2022-01-09 ENCOUNTER — Inpatient Hospital Stay (HOSPITAL_COMMUNITY)
Admission: EM | Admit: 2022-01-09 | Discharge: 2022-01-21 | DRG: 286 | Disposition: A | Discharge status: Home / Self Care | Payer: Medicare Other | Attending: Internal Medicine | Admitting: Internal Medicine

## 2022-01-09 ENCOUNTER — Emergency Department (HOSPITAL_COMMUNITY): Payer: Medicare Other

## 2022-01-09 ENCOUNTER — Other Ambulatory Visit: Payer: Self-pay

## 2022-01-09 ENCOUNTER — Encounter (HOSPITAL_COMMUNITY): Payer: Self-pay | Admitting: Emergency Medicine

## 2022-01-09 DIAGNOSIS — R609 Edema, unspecified: Secondary | ICD-10-CM | POA: Diagnosis not present

## 2022-01-09 DIAGNOSIS — J849 Interstitial pulmonary disease, unspecified: Secondary | ICD-10-CM | POA: Diagnosis present

## 2022-01-09 DIAGNOSIS — Z23 Encounter for immunization: Secondary | ICD-10-CM | POA: Diagnosis not present

## 2022-01-09 DIAGNOSIS — Z96652 Presence of left artificial knee joint: Secondary | ICD-10-CM | POA: Diagnosis present

## 2022-01-09 DIAGNOSIS — I2699 Other pulmonary embolism without acute cor pulmonale: Secondary | ICD-10-CM | POA: Diagnosis not present

## 2022-01-09 DIAGNOSIS — Y840 Cardiac catheterization as the cause of abnormal reaction of the patient, or of later complication, without mention of misadventure at the time of the procedure: Secondary | ICD-10-CM | POA: Diagnosis not present

## 2022-01-09 DIAGNOSIS — I110 Hypertensive heart disease with heart failure: Secondary | ICD-10-CM | POA: Diagnosis not present

## 2022-01-09 DIAGNOSIS — E871 Hypo-osmolality and hyponatremia: Secondary | ICD-10-CM | POA: Diagnosis not present

## 2022-01-09 DIAGNOSIS — I10 Essential (primary) hypertension: Secondary | ICD-10-CM | POA: Diagnosis present

## 2022-01-09 DIAGNOSIS — D72829 Elevated white blood cell count, unspecified: Secondary | ICD-10-CM | POA: Diagnosis not present

## 2022-01-09 DIAGNOSIS — Z885 Allergy status to narcotic agent status: Secondary | ICD-10-CM

## 2022-01-09 DIAGNOSIS — E119 Type 2 diabetes mellitus without complications: Secondary | ICD-10-CM | POA: Diagnosis not present

## 2022-01-09 DIAGNOSIS — Z720 Tobacco use: Secondary | ICD-10-CM | POA: Diagnosis present

## 2022-01-09 DIAGNOSIS — I2582 Chronic total occlusion of coronary artery: Secondary | ICD-10-CM | POA: Diagnosis present

## 2022-01-09 DIAGNOSIS — Z888 Allergy status to other drugs, medicaments and biological substances status: Secondary | ICD-10-CM

## 2022-01-09 DIAGNOSIS — Z825 Family history of asthma and other chronic lower respiratory diseases: Secondary | ICD-10-CM

## 2022-01-09 DIAGNOSIS — N179 Acute kidney failure, unspecified: Secondary | ICD-10-CM | POA: Diagnosis present

## 2022-01-09 DIAGNOSIS — L7632 Postprocedural hematoma of skin and subcutaneous tissue following other procedure: Secondary | ICD-10-CM | POA: Diagnosis not present

## 2022-01-09 DIAGNOSIS — E1165 Type 2 diabetes mellitus with hyperglycemia: Secondary | ICD-10-CM | POA: Diagnosis not present

## 2022-01-09 DIAGNOSIS — Z79899 Other long term (current) drug therapy: Secondary | ICD-10-CM

## 2022-01-09 DIAGNOSIS — Z8249 Family history of ischemic heart disease and other diseases of the circulatory system: Secondary | ICD-10-CM

## 2022-01-09 DIAGNOSIS — Z20822 Contact with and (suspected) exposure to covid-19: Secondary | ICD-10-CM | POA: Diagnosis present

## 2022-01-09 DIAGNOSIS — Z794 Long term (current) use of insulin: Secondary | ICD-10-CM

## 2022-01-09 DIAGNOSIS — Z7901 Long term (current) use of anticoagulants: Secondary | ICD-10-CM

## 2022-01-09 DIAGNOSIS — F419 Anxiety disorder, unspecified: Secondary | ICD-10-CM | POA: Diagnosis present

## 2022-01-09 DIAGNOSIS — R778 Other specified abnormalities of plasma proteins: Secondary | ICD-10-CM | POA: Diagnosis not present

## 2022-01-09 DIAGNOSIS — K219 Gastro-esophageal reflux disease without esophagitis: Secondary | ICD-10-CM | POA: Diagnosis present

## 2022-01-09 DIAGNOSIS — G4730 Sleep apnea, unspecified: Secondary | ICD-10-CM | POA: Diagnosis not present

## 2022-01-09 DIAGNOSIS — I272 Pulmonary hypertension, unspecified: Secondary | ICD-10-CM | POA: Diagnosis present

## 2022-01-09 DIAGNOSIS — I2510 Atherosclerotic heart disease of native coronary artery without angina pectoris: Secondary | ICD-10-CM | POA: Diagnosis not present

## 2022-01-09 DIAGNOSIS — I429 Cardiomyopathy, unspecified: Secondary | ICD-10-CM | POA: Diagnosis not present

## 2022-01-09 DIAGNOSIS — F32A Depression, unspecified: Secondary | ICD-10-CM | POA: Diagnosis not present

## 2022-01-09 DIAGNOSIS — I081 Rheumatic disorders of both mitral and tricuspid valves: Secondary | ICD-10-CM | POA: Diagnosis present

## 2022-01-09 DIAGNOSIS — Z7984 Long term (current) use of oral hypoglycemic drugs: Secondary | ICD-10-CM

## 2022-01-09 DIAGNOSIS — Z85820 Personal history of malignant melanoma of skin: Secondary | ICD-10-CM

## 2022-01-09 DIAGNOSIS — I5021 Acute systolic (congestive) heart failure: Secondary | ICD-10-CM | POA: Diagnosis not present

## 2022-01-09 DIAGNOSIS — R0602 Shortness of breath: Secondary | ICD-10-CM | POA: Diagnosis not present

## 2022-01-09 DIAGNOSIS — D649 Anemia, unspecified: Secondary | ICD-10-CM | POA: Diagnosis not present

## 2022-01-09 DIAGNOSIS — N400 Enlarged prostate without lower urinary tract symptoms: Secondary | ICD-10-CM | POA: Diagnosis present

## 2022-01-09 DIAGNOSIS — E669 Obesity, unspecified: Secondary | ICD-10-CM | POA: Diagnosis not present

## 2022-01-09 DIAGNOSIS — Z800 Family history of malignant neoplasm of digestive organs: Secondary | ICD-10-CM

## 2022-01-09 DIAGNOSIS — I509 Heart failure, unspecified: Secondary | ICD-10-CM | POA: Diagnosis not present

## 2022-01-09 DIAGNOSIS — Z87891 Personal history of nicotine dependence: Secondary | ICD-10-CM

## 2022-01-09 DIAGNOSIS — E785 Hyperlipidemia, unspecified: Secondary | ICD-10-CM | POA: Diagnosis not present

## 2022-01-09 DIAGNOSIS — R0609 Other forms of dyspnea: Secondary | ICD-10-CM | POA: Diagnosis not present

## 2022-01-09 DIAGNOSIS — Z881 Allergy status to other antibiotic agents status: Secondary | ICD-10-CM

## 2022-01-09 DIAGNOSIS — Z801 Family history of malignant neoplasm of trachea, bronchus and lung: Secondary | ICD-10-CM

## 2022-01-09 DIAGNOSIS — R262 Difficulty in walking, not elsewhere classified: Secondary | ICD-10-CM | POA: Diagnosis not present

## 2022-01-09 DIAGNOSIS — I2609 Other pulmonary embolism with acute cor pulmonale: Secondary | ICD-10-CM | POA: Diagnosis not present

## 2022-01-09 DIAGNOSIS — Z6827 Body mass index (BMI) 27.0-27.9, adult: Secondary | ICD-10-CM

## 2022-01-09 DIAGNOSIS — Z905 Acquired absence of kidney: Secondary | ICD-10-CM

## 2022-01-09 DIAGNOSIS — Z7982 Long term (current) use of aspirin: Secondary | ICD-10-CM

## 2022-01-09 DIAGNOSIS — J90 Pleural effusion, not elsewhere classified: Secondary | ICD-10-CM | POA: Diagnosis not present

## 2022-01-09 DIAGNOSIS — Z8261 Family history of arthritis: Secondary | ICD-10-CM

## 2022-01-09 LAB — BASIC METABOLIC PANEL
Anion gap: 7 (ref 5–15)
BUN: 45 mg/dL — ABNORMAL HIGH (ref 8–23)
CO2: 24 mmol/L (ref 22–32)
Calcium: 8.9 mg/dL (ref 8.9–10.3)
Chloride: 102 mmol/L (ref 98–111)
Creatinine, Ser: 1.3 mg/dL — ABNORMAL HIGH (ref 0.61–1.24)
GFR, Estimated: 59 mL/min — ABNORMAL LOW (ref >60)
Glucose, Bld: 330 mg/dL — ABNORMAL HIGH (ref 70–99)
Potassium: 4.5 mmol/L (ref 3.5–5.1)
Sodium: 133 mmol/L — ABNORMAL LOW (ref 135–145)

## 2022-01-09 LAB — BRAIN NATRIURETIC PEPTIDE: B Natriuretic Peptide: 2249.9 pg/mL — ABNORMAL HIGH (ref 0.0–100.0)

## 2022-01-09 LAB — CBC WITH DIFFERENTIAL/PLATELET
Abs Immature Granulocytes: 0.06 10*3/uL (ref 0.00–0.07)
Basophils Absolute: 0.1 10*3/uL (ref 0.0–0.1)
Basophils Relative: 1 %
Eosinophils Absolute: 0.1 10*3/uL (ref 0.0–0.5)
Eosinophils Relative: 1 %
HCT: 35.2 % — ABNORMAL LOW (ref 39.0–52.0)
Hemoglobin: 11.1 g/dL — ABNORMAL LOW (ref 13.0–17.0)
Immature Granulocytes: 1 %
Lymphocytes Relative: 16 %
Lymphs Abs: 1.9 10*3/uL (ref 0.7–4.0)
MCH: 29.9 pg (ref 26.0–34.0)
MCHC: 31.5 g/dL (ref 30.0–36.0)
MCV: 94.9 fL (ref 80.0–100.0)
Monocytes Absolute: 0.6 10*3/uL (ref 0.1–1.0)
Monocytes Relative: 5 %
Neutro Abs: 9 10*3/uL — ABNORMAL HIGH (ref 1.7–7.7)
Neutrophils Relative %: 76 %
Platelets: 366 10*3/uL (ref 150–400)
RBC: 3.71 MIL/uL — ABNORMAL LOW (ref 4.22–5.81)
RDW: 15.3 % (ref 11.5–15.5)
WBC: 11.7 10*3/uL — ABNORMAL HIGH (ref 4.0–10.5)
nRBC: 0 % (ref 0.0–0.2)

## 2022-01-09 LAB — URINALYSIS, ROUTINE W REFLEX MICROSCOPIC
Bilirubin Urine: NEGATIVE
Glucose, UA: 50 mg/dL — AB
Ketones, ur: NEGATIVE mg/dL
Leukocytes,Ua: NEGATIVE
Nitrite: NEGATIVE
Protein, ur: 100 mg/dL — AB
Specific Gravity, Urine: 1.01 (ref 1.005–1.030)
pH: 5 (ref 5.0–8.0)

## 2022-01-09 LAB — TSH: TSH: 2.835 u[IU]/mL (ref 0.350–4.500)

## 2022-01-09 LAB — GLUCOSE, CAPILLARY: Glucose-Capillary: 281 mg/dL — ABNORMAL HIGH (ref 70–99)

## 2022-01-09 LAB — TROPONIN I (HIGH SENSITIVITY)
Troponin I (High Sensitivity): 100 ng/L (ref <18)
Troponin I (High Sensitivity): 99 ng/L — ABNORMAL HIGH (ref <18)

## 2022-01-09 LAB — PROCALCITONIN: Procalcitonin: <0.1 ng/mL

## 2022-01-09 MED ORDER — ALBUTEROL SULFATE (2.5 MG/3ML) 0.083% IN NEBU
2.5000 mg | INHALATION_SOLUTION | RESPIRATORY_TRACT | Status: DC | PRN
Start: 2022-01-09 — End: 2022-01-21
  Administered 2022-01-09 – 2022-01-15 (×4): 2.5 mg via RESPIRATORY_TRACT
  Filled 2022-01-09 (×5): qty 3

## 2022-01-09 MED ORDER — PANTOPRAZOLE SODIUM 40 MG PO TBEC
80.0000 mg | DELAYED_RELEASE_TABLET | Freq: Every day | ORAL | Status: DC
  Administered 2022-01-09 – 2022-01-21 (×12): 80 mg via ORAL
  Filled 2022-01-09 (×12): qty 2

## 2022-01-09 MED ORDER — HYDRALAZINE HCL 25 MG PO TABS: 25.0000 mg | ORAL_TABLET | Freq: Three times a day (TID) | ORAL | Status: DC | PRN

## 2022-01-09 MED ORDER — AMLODIPINE BESYLATE 5 MG PO TABS
2.5000 mg | ORAL_TABLET | Freq: Every day | ORAL | Status: DC
  Administered 2022-01-09 – 2022-01-11 (×3): 2.5 mg via ORAL
  Filled 2022-01-09 (×3): qty 1

## 2022-01-09 MED ORDER — ZOLPIDEM TARTRATE 5 MG PO TABS
5.0000 mg | ORAL_TABLET | Freq: Every day | ORAL | Status: DC
  Administered 2022-01-09 – 2022-01-20 (×12): 5 mg via ORAL
  Filled 2022-01-09 (×12): qty 1

## 2022-01-09 MED ORDER — ALBUTEROL SULFATE HFA 108 (90 BASE) MCG/ACT IN AERS: 2.0000 | INHALATION_SPRAY | RESPIRATORY_TRACT | Status: DC | PRN

## 2022-01-09 MED ORDER — APIXABAN 5 MG PO TABS: 5.0000 mg | ORAL_TABLET | Freq: Two times a day (BID) | ORAL | Status: DC

## 2022-01-09 MED ORDER — DULOXETINE HCL 60 MG PO CPEP
60.0000 mg | ORAL_CAPSULE | Freq: Every day | ORAL | Status: DC
  Administered 2022-01-10 – 2022-01-21 (×11): 60 mg via ORAL
  Filled 2022-01-09 (×11): qty 1

## 2022-01-09 MED ORDER — LORATADINE 10 MG PO TABS: 10.0000 mg | ORAL_TABLET | Freq: Every day | ORAL | Status: DC

## 2022-01-09 MED ORDER — POLYETHYLENE GLYCOL 3350 17 G PO PACK
17.0000 g | PACK | Freq: Every day | ORAL | Status: DC | PRN
  Administered 2022-01-16: 17 g via ORAL
  Filled 2022-01-09: qty 1

## 2022-01-09 MED ORDER — FUROSEMIDE 10 MG/ML IJ SOLN
40.0000 mg | Freq: Once | INTRAMUSCULAR | Status: AC
  Administered 2022-01-09: 40 mg via INTRAVENOUS
  Filled 2022-01-09: qty 4

## 2022-01-09 MED ORDER — INSULIN ASPART 100 UNIT/ML IJ SOLN
0.0000 [IU] | Freq: Every day | INTRAMUSCULAR | Status: DC
  Administered 2022-01-09: 3 [IU] via SUBCUTANEOUS
  Administered 2022-01-14: 5 [IU] via SUBCUTANEOUS
  Administered 2022-01-17 – 2022-01-20 (×3): 2 [IU] via SUBCUTANEOUS

## 2022-01-09 MED ORDER — SIMVASTATIN 20 MG PO TABS
20.0000 mg | ORAL_TABLET | Freq: Every morning | ORAL | Status: DC
  Administered 2022-01-10 – 2022-01-17 (×7): 20 mg via ORAL
  Filled 2022-01-09 (×7): qty 1

## 2022-01-09 MED ORDER — FUROSEMIDE 10 MG/ML IJ SOLN
40.0000 mg | Freq: Two times a day (BID) | INTRAMUSCULAR | Status: DC
  Administered 2022-01-09 – 2022-01-12 (×6): 40 mg via INTRAVENOUS
  Filled 2022-01-09 (×5): qty 4

## 2022-01-09 MED ORDER — ONDANSETRON HCL 4 MG PO TABS
4.0000 mg | ORAL_TABLET | Freq: Four times a day (QID) | ORAL | Status: DC | PRN
  Filled 2022-01-09: qty 1

## 2022-01-09 MED ORDER — GUAIFENESIN-DM 100-10 MG/5ML PO SYRP
5.0000 mL | ORAL_SOLUTION | ORAL | Status: DC | PRN
  Administered 2022-01-09 – 2022-01-14 (×6): 5 mL via ORAL
  Filled 2022-01-09 (×5): qty 10
  Filled 2022-01-09: qty 5

## 2022-01-09 MED ORDER — ONDANSETRON HCL 4 MG/2ML IJ SOLN: 4.0000 mg | Freq: Four times a day (QID) | INTRAMUSCULAR | Status: DC | PRN

## 2022-01-09 MED ORDER — APIXABAN 5 MG PO TABS
10.0000 mg | ORAL_TABLET | Freq: Two times a day (BID) | ORAL | Status: DC
  Administered 2022-01-09 – 2022-01-11 (×4): 10 mg via ORAL
  Filled 2022-01-09 (×4): qty 2

## 2022-01-09 MED ORDER — ACETAMINOPHEN 650 MG RE SUPP: 650.0000 mg | Freq: Four times a day (QID) | RECTAL | Status: DC | PRN

## 2022-01-09 MED ORDER — INSULIN ASPART 100 UNIT/ML IJ SOLN
10.0000 [IU] | Freq: Two times a day (BID) | INTRAMUSCULAR | Status: DC
Start: 2022-01-10 — End: 2022-01-11
  Administered 2022-01-10 (×2): 10 [IU] via SUBCUTANEOUS

## 2022-01-09 MED ORDER — FLUTICASONE PROPIONATE 50 MCG/ACT NA SUSP
1.0000 | Freq: Every day | NASAL | Status: DC | PRN
  Filled 2022-01-09: qty 16

## 2022-01-09 MED ORDER — INSULIN ASPART 100 UNIT/ML IJ SOLN
0.0000 [IU] | Freq: Three times a day (TID) | INTRAMUSCULAR | Status: DC
  Administered 2022-01-10 (×2): 5 [IU] via SUBCUTANEOUS
  Administered 2022-01-11: 8 [IU] via SUBCUTANEOUS
  Administered 2022-01-11: 2 [IU] via SUBCUTANEOUS
  Administered 2022-01-11: 8 [IU] via SUBCUTANEOUS
  Administered 2022-01-12: 3 [IU] via SUBCUTANEOUS
  Administered 2022-01-12: 8 [IU] via SUBCUTANEOUS
  Administered 2022-01-12: 5 [IU] via SUBCUTANEOUS
  Administered 2022-01-13 (×2): 3 [IU] via SUBCUTANEOUS
  Administered 2022-01-13: 5 [IU] via SUBCUTANEOUS
  Administered 2022-01-14 (×2): 3 [IU] via SUBCUTANEOUS
  Administered 2022-01-15: 11 [IU] via SUBCUTANEOUS
  Administered 2022-01-16: 5 [IU] via SUBCUTANEOUS
  Administered 2022-01-16: 3 [IU] via SUBCUTANEOUS
  Administered 2022-01-16: 5 [IU] via SUBCUTANEOUS
  Administered 2022-01-17 (×2): 3 [IU] via SUBCUTANEOUS
  Administered 2022-01-17 – 2022-01-18 (×2): 5 [IU] via SUBCUTANEOUS
  Administered 2022-01-18: 3 [IU] via SUBCUTANEOUS
  Administered 2022-01-18: 5 [IU] via SUBCUTANEOUS
  Administered 2022-01-19 (×2): 3 [IU] via SUBCUTANEOUS
  Administered 2022-01-19: 5 [IU] via SUBCUTANEOUS
  Administered 2022-01-20: 2 [IU] via SUBCUTANEOUS
  Administered 2022-01-20: 5 [IU] via SUBCUTANEOUS
  Administered 2022-01-20: 3 [IU] via SUBCUTANEOUS

## 2022-01-09 MED ORDER — ACETAMINOPHEN 325 MG PO TABS: 650.0000 mg | ORAL_TABLET | Freq: Four times a day (QID) | ORAL | Status: DC | PRN

## 2022-01-09 NOTE — Plan of Care (Signed)
  Problem: Education: Goal: Ability to demonstrate management of disease process will improve Outcome: Progressing Goal: Ability to verbalize understanding of medication therapies will improve Outcome: Progressing   

## 2022-01-09 NOTE — ED Provider Notes (Signed)
[facility] DEPT Provider Note   CSN: [id number] Arrival date & time: 01/09/22  1204     History  Chief Complaint  Patient presents with   Shortness of Breath    Phillip Rich is a 71 y.o. male.   Shortness of Breath Patient with shortness of breath.  Diagnosed 4 days ago with volume overload and pulmonary embolisms.  Started on blood thinners and continues to have dyspnea.  States he cannot sleep flat.  Continued swelling in his legs.  Status weight is down around 1 pound.  States walking from the car to here he had to stop at the bench to take a breath.  Feels that the breathing is not improved at all. Also history of interstitial lung disease.    Past Medical History:  Diagnosis Date   Anxiety    BPH (benign prostatic hyperplasia)    Complication of anesthesia    Diabetes mellitus    DJD (degenerative joint disease)    GERD (gastroesophageal reflux disease)    HTN (hypertension)    Hyperlipidemia    ILD (interstitial lung disease) (HCC)    Left renal mass    Melanoma (HCC)    Mild sleep apnea    PONV (postoperative nausea and vomiting)     Home Medications Prior to Admission medications   Medication Sig Start Date End Date Taking? Authorizing Provider  albuterol (VENTOLIN HFA) 108 (90 Base) MCG/ACT inhaler Inhale 1-2 puffs into the lungs every 6 (six) hours as needed for wheezing or shortness of breath. 01/05/22   Kneller, [place] K, DO  ALPHA LIPOIC ACID PO Take 1 tablet by mouth every morning.    [provider]  APIXABAN Arne Cleveland) VTE STARTER PACK ('10MG'$  AND '5MG'$ ) Take as directed on package: start with two-'5mg'$  tablets twice daily for 7 days. On day 8, switch to one-'5mg'$  tablet twice daily. 01/05/22   Kneller, Norman Herrlich, DO  aspirin 81 MG tablet Take 81 mg by mouth at bedtime.     [provider]  cetirizine (ZYRTEC) 10 MG tablet Take 10 mg by mouth every morning.     [provider]  Cholecalciferol  (VITAMIN D PO) Take 1 tablet by mouth every morning.    [provider]  DULoxetine (CYMBALTA) 60 MG capsule Take 60 mg by mouth daily. 05/28/21   [provider]  glimepiride (AMARYL) 4 MG tablet Take 1 tablet by mouth Twice daily. 08/06/10   [provider]  insulin lispro (HUMALOG) 100 UNIT/ML injection Inject 12 Units into the skin 2 (two) times daily with a meal.    [provider]  insulin NPH-regular Human (NOVOLIN 70/30) (70-30) 100 UNIT/ML injection Inject 50 Units into the skin 2 (two) times daily.     [provider]  ipratropium (ATROVENT) 0.03 % nasal spray Place 2 sprays into the nose 2 (two) times daily. 07/02/11 03/16/14  Harrison Mons, PA  losartan (COZAAR) 25 MG tablet Take 1 tablet (25 mg total) by mouth daily. Please HOLD rx  Until patient calls for refill. Patient taking differently: Take 25 mg by mouth every morning. 12/19/10   Brand Males, MD  metFORMIN (GLUCOPHAGE) 1000 MG tablet Take 1,000 mg by mouth 2 (two) times daily.  03/06/10   [provider]  Multiple Vitamin (MULTIVITAMIN WITH MINERALS) TABS tablet Take 1 tablet by mouth every morning.    [provider]  omeprazole (PRILOSEC) 20 MG capsule Take 1 tablet by mouth every morning.  08/06/10  [provider]  pioglitazone (ACTOS) 30 MG tablet Take 30 mg by mouth every morning.     [provider]  predniSONE (DELTASONE) 10 MG tablet Take 4 tablets (40 mg total) by mouth daily for 4 days. 01/05/22 01/09/22  Kneller, Norman Herrlich, DO  promethazine (PHENERGAN) 12.5 MG tablet Take 1 tablet by mouth Every 6 hours as needed for nausea or vomiting.  10/09/10   [provider]  simvastatin (ZOCOR) 20 MG tablet Take 1 tablet by mouth every morning.  09/11/10   [provider]  TRULICITY 1.51 VO/1.6WV SOPN Inject into the skin. 07/03/21   [provider]      Allergies    Codeine, Crestor [rosuvastatin calcium], and  Erythromycin    Review of Systems   Review of Systems  Respiratory:  Positive for shortness of breath.     Physical Exam Updated Vital Signs BP (!) 135/98   Pulse (!) 112   Temp 98 F (36.7 C) (Oral)   Resp (!) 27   SpO2 98%  Physical Exam Vitals and nursing note reviewed.  Cardiovascular:     Rate and Rhythm: Regular rhythm. Tachycardia present.  Pulmonary:     Comments: Mildly harsh breath sounds bilaterally. Chest:     Chest wall: No tenderness.  Musculoskeletal:     Right lower leg: Edema present.     Left lower leg: Edema present.  Neurological:     Mental Status: He is alert.     ED Results / Procedures / Treatments   Labs (all labs ordered are listed, but only abnormal results are displayed) Labs Reviewed  BASIC METABOLIC PANEL - Abnormal; Notable for the following components:      Result Value   Sodium 133 (*)    Glucose, Bld 330 (*)    BUN 45 (*)    Creatinine, Ser 1.30 (*)    GFR, Estimated 59 (*)    All other components within normal limits  CBC WITH DIFFERENTIAL/PLATELET - Abnormal; Notable for the following components:   WBC 11.7 (*)    RBC 3.71 (*)    Hemoglobin 11.1 (*)    HCT 35.2 (*)    Neutro Abs 9.0 (*)    All other components within normal limits  TROPONIN I (HIGH SENSITIVITY) - Abnormal; Notable for the following components:   Troponin I (High Sensitivity) 100 (*)    All other components within normal limits  BRAIN NATRIURETIC PEPTIDE  URINALYSIS, ROUTINE W REFLEX MICROSCOPIC  TROPONIN I (HIGH SENSITIVITY)    EKG EKG Interpretation  Date/Time:  Thursday January 09 2022 12:24:40 EDT Ventricular Rate:  109 PR Interval:  169 QRS Duration: 119 QT Interval:  353 QTC Calculation: 476 R Axis:   267 Text Interpretation: Sinus tachycardia Probable left atrial enlargement Left anterior fascicular block Nonspecific T abnormalities, lateral leads No significant change since last tracing Confirmed by Davonna Belling [id number]) on 01/09/2022  1:19:00 PM  Radiology DG Chest 2 View  Result Date: 01/09/2022 CLINICAL DATA:  Short of breath.  Recent pulmonary embolism EXAM: CHEST - 2 VIEW COMPARISON:  Chest CT 01/05/2022, radiograph 01/05/2022 FINDINGS: Stable cardiac silhouette. Low lung volumes. There is bibasilar airspace opacities and small effusions similar comparison exam. There is interval increase in central venous congestion compared to prior. IMPRESSION: *Bibasilar opacities representing pneumonia or pulmonary edema. *Interval increase in central venous congestion. *Low lung volumes and pleural effusions. Electronically Signed   By: Suzy Bouchard M.D.   On: 01/09/2022 13:43  Procedures Procedures    Medications Ordered in ED Medications  albuterol (VENTOLIN HFA) 108 (90 Base) MCG/ACT inhaler 2 puff (has no administration in time range)  furosemide (LASIX) injection 40 mg (40 mg Intravenous Given 01/09/22 1401)    ED Course/ Medical Decision Making/ A&P                           Medical Decision Making Amount and/or Complexity of Data Reviewed Radiology: ordered.  Risk Prescription drug management.   Patient with shortness of breath.  History of interstitial lung disease volume overload and pulmonary embolisms.  Recently seen for same and refused admission.  Returns with continued short of breath.  Is dyspneic just talking.  Not hypoxic upper.  Has moderate edema on his extremities.  I think likely component of both PEs and to the fluid as the symptoms.  We will give some Lasix as x-ray shows likely pulmonary edema.  Already on anticoagulation.  I think he will require admission in the hospital however.  Reviewed blood work.  Troponin is mildly elevated.  Was 27 a few days ago and is at 100 now.  Still require admission to the hospital.  Will discuss with hospitalist.          Final Clinical Impression(s) / ED Diagnoses Final diagnoses:  Acute pulmonary embolism, unspecified pulmonary embolism type,  unspecified whether acute cor pulmonale present ([place])  Congestive heart failure, unspecified HF chronicity, unspecified heart failure type [facility])    Rx / DC Orders ED Discharge Orders     None         Davonna Belling, MD 01/09/22 1506

## 2022-01-09 NOTE — ED Triage Notes (Signed)
Pt reports SHOB. Pt states he left AMA after being told he had blood clots and fluid on his lungs. Pt reports needing admission for same.

## 2022-01-09 NOTE — Progress Notes (Signed)
PHARMACY NOTE -  Eliquis  Pharmacy has been assisting with dosing of Eliquis for recent DVT. Eliquis started 8/27 per patient; will continue 10 mg PO bid through 9/2 (7 days total), then switch to 5 mg PO bid.  Pharmacy will sign off, following peripherally for labs and tolerability. Please reconsult if a change in clinical status warrants re-evaluation of dosage.  Reuel Boom, PharmD, BCPS [phone] 01/09/2022, 6:06 PM

## 2022-01-09 NOTE — H&P (Addendum)
History and Physical    Patient: Phillip Rich ZDG:[id number] DOB: 02/03/1951 DOA: 01/09/2022 DOS: the patient was seen and examined on 01/09/2022 PCP: Lajean Manes, MD  Patient coming from: Home  Chief Complaint: Shortness of breath Chief Complaint  Patient presents with   Shortness of Breath   HPI: Phillip Rich is a 71 y.o. male with medical history significant of type 2 diabetes mellitus, HTN, BPH, peripheral neuropathy, depression, history of left renal mass s/p partial nephrectomy, history of previous tobacco use disorder who presents to [facility] ED on 8/31 with progressive shortness of breath.  Patient was recently seen in the ED on 8/27 and diagnosed with acute pulmonary embolism and concern for vascular congestion from underlying CHF; but unfortunately patient left AMA but was prescribed Eliquis and Lasix on discharge from the ED.  Initially patient reports symptom onset roughly 1 week ago after recent travel to [place] in which he flew on an airplane and following return started experiencing some weight gain and lower extremity edema.  He has been having difficulty laying flat and finding a position of comfort.  He also reports difficulty ambulating even 20 feet without having stopping and resting.  He was seen by his PCP and directed to the ED for further evaluation.  He reports a roughly 11 pound weight gain over the last week.  He reports compliance with the majority of his medications but does admit to missing some of his insulin occasionally.  He does not recall ever having an echocardiogram, never seen a cardiologist in the past but was recently referred to a pulmonologist with also concerns about obstructive sleep apnea.  Patient denies headache, no dizziness, no chest pain, no fever/chills/night sweats, no nausea/vomiting/diarrhea, no abdominal pain, no focal weakness, no cough, no paresthesias.  In the ED, temperature 98.0 F, HR 109, RR 15, BP 133/87, SPO2 100% on room air.  WBC  11.7, hemoglobin 11.1, platelets 366.  Sodium 133, potassium 4.5, chloride 102, CO2 24, glucose 330, BUN 45, creatinine 1.30.  High sensitive troponin 100>99.  BNP 2249.9.  Chest x-ray with bibasilar opacities representing pneumonia or pulmonary edema with interval increase in central venous congestion.  EKG personally reviewed with sinus tachycardia, rate 109, T wave inversion in lead I/aVL and V6 which is similar in appearance to EKG dated 01/05/2022.  Patient was given an albuterol neb treatment, Lasix in the ED.  EDP consulted [facility] for further evaluation and management of CHF exacerbation that is undifferentiated and recent pulmonary embolism.   Review of Systems: As mentioned in the history of present illness. All other systems reviewed and are negative. Past Medical History:  Diagnosis Date   Anxiety    BPH (benign prostatic hyperplasia)    Complication of anesthesia    Diabetes mellitus    DJD (degenerative joint disease)    GERD (gastroesophageal reflux disease)    HTN (hypertension)    Hyperlipidemia    ILD (interstitial lung disease) ([place])    Left renal mass    Melanoma ([place])    Mild sleep apnea    PONV (postoperative nausea and vomiting)    Past Surgical History:  Procedure Laterality Date   COLONOSCOPY WITH PROPOFOL N/A 04/03/2014   Procedure: COLONOSCOPY WITH PROPOFOL;  Surgeon: Garlan Fair, MD;  Location: [facility];  Service: Endoscopy;  Laterality: N/A;   KNEE ARTHROSCOPY Right    x2 right, x5 left   LASIK     MELANOMA EXCISION Left    NEPHRECTOMY Left 2022  partial   PROSTATE ABLATION     TOTAL KNEE ARTHROPLASTY     left   VASECTOMY     Social History:  reports that he has quit smoking. His smoking use included cigarettes. He has a 50.00 pack-year smoking history. He has never used smokeless tobacco. He reports current alcohol use. He reports current drug use. Drug: Marijuana.  Allergies  Allergen Reactions   Codeine Nausea And Vomiting   Crestor  [Rosuvastatin Calcium] Other (See Comments)    Leg aches   Erythromycin Rash    All Mycin drugs    Family History  Problem Relation Age of Onset   Lung cancer Mother    Heart disease Father    Asthma Sister    Asthma Sister    Colon cancer Sister    Rheum arthritis Sister        x3    Prior to Admission medications   Medication Sig Start Date End Date Taking? Authorizing Provider  albuterol (VENTOLIN HFA) 108 (90 Base) MCG/ACT inhaler Inhale 1-2 puffs into the lungs every 6 (six) hours as needed for wheezing or shortness of breath. 01/05/22   Kneller, [place] K, DO  ALPHA LIPOIC ACID PO Take 1 tablet by mouth every morning.    [provider]  APIXABAN Arne Cleveland) VTE STARTER PACK ('10MG'$  AND '5MG'$ ) Take as directed on package: start with two-'5mg'$  tablets twice daily for 7 days. On day 8, switch to one-'5mg'$  tablet twice daily. 01/05/22   Kneller, Norman Herrlich, DO  aspirin 81 MG tablet Take 81 mg by mouth at bedtime.     [provider]  cetirizine (ZYRTEC) 10 MG tablet Take 10 mg by mouth every morning.     [provider]  Cholecalciferol (VITAMIN D PO) Take 1 tablet by mouth every morning.    [provider]  DULoxetine (CYMBALTA) 60 MG capsule Take 60 mg by mouth daily. 05/28/21   [provider]  glimepiride (AMARYL) 4 MG tablet Take 1 tablet by mouth Twice daily. 08/06/10   [provider]  insulin lispro (HUMALOG) 100 UNIT/ML injection Inject 12 Units into the skin 2 (two) times daily with a meal.    [provider]  insulin NPH-regular Human (NOVOLIN 70/30) (70-30) 100 UNIT/ML injection Inject 50 Units into the skin 2 (two) times daily.     [provider]  ipratropium (ATROVENT) 0.03 % nasal spray Place 2 sprays into the nose 2 (two) times daily. 07/02/11 03/16/14  Harrison Mons, PA  losartan (COZAAR) 25 MG tablet Take 1 tablet (25 mg total) by mouth daily. Please HOLD rx  Until patient calls for refill. Patient taking  differently: Take 25 mg by mouth every morning. 12/19/10   Brand Males, MD  metFORMIN (GLUCOPHAGE) 1000 MG tablet Take 1,000 mg by mouth 2 (two) times daily.  03/06/10   [provider]  Multiple Vitamin (MULTIVITAMIN WITH MINERALS) TABS tablet Take 1 tablet by mouth every morning.    [provider]  omeprazole (PRILOSEC) 20 MG capsule Take 1 tablet by mouth every morning.  08/06/10   [provider]  pioglitazone (ACTOS) 30 MG tablet Take 30 mg by mouth every morning.     [provider]  predniSONE (DELTASONE) 10 MG tablet Take 4 tablets (40 mg total) by mouth daily for 4 days. 01/05/22 01/09/22  Kneller, Norman Herrlich, DO  promethazine (PHENERGAN) 12.5 MG tablet Take 1 tablet by mouth Every 6 hours as needed for nausea or vomiting.  10/09/10  [provider]  simvastatin (ZOCOR) 20 MG tablet Take 1 tablet by mouth every morning.  09/11/10   [provider]  TRULICITY 5.10 CH/8.5ID SOPN Inject into the skin. 07/03/21   [provider]    Physical Exam: Vitals:   01/09/22 1248 01/09/22 1430  BP: 133/87 (!) 135/98  Pulse: (!) 109 (!) 112  Resp: 15 (!) 27  Temp: 98 F (36.7 C)   TempSrc: Oral   SpO2: 100% 98%    GEN: 71 yo male in NAD, alert and oriented x 3, wd/wn HEENT: NCAT, PERRL, EOMI, sclera clear, MMM PULM: Breath sounds diminished bilateral bases with diffuse crackles, no wheezing, normal respiratory effort without accessory muscle use, on room air with SPO2 at 98% CV: RRR w/o M/G/R GI: abd soft, NTND, NABS, no R/G/M MSK: 2+ pitting edema bilateral lower extremities up to knee, muscle strength globally intact 5/5 bilateral upper/lower extremities NEURO: CN II-XII intact, no focal deficits, sensation to light touch intact PSYCH: normal mood/affect Integumentary: Chronic venous changes to bilateral lower extremities, multiple areas of ecchymosis to extremities in various stages of healing, otherwise no other concerning  rashes/lesions/wounds to exposed skin regions.    Assessment and Plan:  Acute congestive heart failure exacerbation, undifferentiated Patient presenting to ED with progressive shortness of breath, worse with exertion and reported 11 pound weight gain over the last 1 week.  No previous history of known CHF, has never seen a cardiologist in the past and no echocardiogram available for review in EMR.  Patient with elevated BNP of 2249, chest x-ray with findings of progressive vascular congestion since 4 days prior and physical exam findings of positive JVP and lower extremity edema. -- Admit to inpatient, telemetry -- TTE -- Furosemide 40 mg IV every 12 hours -- Fluid restriction 1800 mL/day -- Strict I's and O's and daily weights -- Monitor BMP daily, lipid panel in a.m.  Acute pulmonary embolism Patient recently traveled via airplane to [place] 1 week ago.  Since has had increased shortness of breath, lower extremity edema and weight gain.  Diagnosed 4 days prior in the ED with acute pulmonary embolism; unfortunate left AMA but was started on Eliquis.  Patient denies chest pain. -- Check echocardiogram as above --Check vascular duplex ultrasound bilateral lower extremities -- Continue Eliquis  Acute renal failure Creatinine elevated 1.30 on admission, was 0.91 on 01/05/2022.  Suspect etiology from volume overload. -- Hold home losartan -- Lasix as above -- Avoid nephrotoxins, renally dose all medications -- BMP daily  Elevated troponin High sensitive troponin elevated 100 followed by 99.  EKG with nonspecific T wave inversions lead I, aVL and V6 that were present previously on 8/27 without change.  Patient denies chest pain.  Etiology likely secondary to type II demand ischemia in the setting of volume overload as above. -- Monitor on telemetry -- trend troponin  Leukocytosis WBC slightly elevated 11.7.  Patient denies cough, afebrile and chest x-ray findings likely more related to  pulmonary edema. -- Hold off antibiotic administration currently; monitor clinically -- Repeat CBC in the a.m.  Hyponatremia Etiology likely hypervolemic hyponatremia in the setting of volume overload.  Sodium 133 on admission.  Treatment as above with IV diuresis. --Repeat BMP in a.m.  Essential hypertension -- Hold home ARB/ACE inhibitor for AKI -- Hydralazine 25 mg p.o. q8h PRN SBP >165 or DBP >110  Type 2 diabetes mellitus Patient reports he currently takes oral hypoglycemics as well as insulin, but unable to tell me the exact  doses currently. -- Await pharmacy reconciliation -- Moderate SSI for coverage -- CBG before every meal/at bedtime  Allergies --Claritin, Flonase  Weakness/debility/deconditioning: Patient lives alone, having increased difficulty ambulating.  Does not use any walking aid at baseline. --PT evaluation in a.m.   Advance Care Planning: Full code  Consults: None  Family Communication: None  Severity of Illness: The appropriate patient status for this patient is INPATIENT. Inpatient status is judged to be reasonable and necessary in order to provide the required intensity of service to ensure the patient's safety. The patient's presenting symptoms, physical exam findings, and initial radiographic and laboratory data in the context of their chronic comorbidities is felt to place them at high risk for further clinical deterioration. Furthermore, it is not anticipated that the patient will be medically stable for discharge from the hospital within 2 midnights of admission.   * I certify that at the point of admission it is my clinical judgment that the patient will require inpatient hospital care spanning beyond 2 midnights from the point of admission due to high intensity of service, high risk for further deterioration and high frequency of surveillance required.*  Author: Trygve Thal J [place] DO 01/09/2022 3:53 PM  For on call review [email or web address].

## 2022-01-09 NOTE — ED Provider Triage Note (Signed)
Emergency Medicine Provider Triage Evaluation Note  Phillip Rich , a 71 y.o. male  was evaluated in triage.  Pt complains of worsening shortness of breath.  Recently left AMA from hospital 3 to 4 days ago after being diagnosed with multiple PEs and fluid in the lungs.  Has been taking the Eliquis and Lasix as prescribed, has taken them today.  Denies chest pain.  Endorses dyspnea on exertion.  No recent fevers, syncope, or injuries.  Review of Systems  Positive:  Negative: See above  Physical Exam  BP 133/87   Pulse (!) 109   Temp 98 F (36.7 C) (Oral)   Resp 15   SpO2 100%  Gen:   Awake, no distress   Resp:  Increased respiratory effort, diffuse abnormal lung sounds MSK:   Moves extremities with some difficulty due to subjective fatigue Other:  Tachycardic, regular rhythm.  Does not appear pale, cyanotic, or diaphoretic.  Medical Decision Making  Medically screening exam initiated at 12:40 PM.  Appropriate orders placed.  Phillip Rich was informed that the remainder of the evaluation will be completed by another provider, this initial triage assessment does not replace that evaluation, and the importance of remaining in the ED until their evaluation is complete.     Phillip Rome, PA-C 93/90/30 1249

## 2022-01-10 ENCOUNTER — Inpatient Hospital Stay (HOSPITAL_COMMUNITY): Payer: Medicare Other

## 2022-01-10 DIAGNOSIS — E119 Type 2 diabetes mellitus without complications: Secondary | ICD-10-CM

## 2022-01-10 DIAGNOSIS — R0609 Other forms of dyspnea: Secondary | ICD-10-CM

## 2022-01-10 DIAGNOSIS — Z794 Long term (current) use of insulin: Secondary | ICD-10-CM

## 2022-01-10 DIAGNOSIS — I10 Essential (primary) hypertension: Secondary | ICD-10-CM

## 2022-01-10 DIAGNOSIS — R778 Other specified abnormalities of plasma proteins: Secondary | ICD-10-CM | POA: Diagnosis not present

## 2022-01-10 DIAGNOSIS — N179 Acute kidney failure, unspecified: Secondary | ICD-10-CM | POA: Diagnosis not present

## 2022-01-10 DIAGNOSIS — R609 Edema, unspecified: Secondary | ICD-10-CM

## 2022-01-10 DIAGNOSIS — I2699 Other pulmonary embolism without acute cor pulmonale: Secondary | ICD-10-CM | POA: Diagnosis not present

## 2022-01-10 DIAGNOSIS — D72829 Elevated white blood cell count, unspecified: Secondary | ICD-10-CM

## 2022-01-10 DIAGNOSIS — I509 Heart failure, unspecified: Secondary | ICD-10-CM | POA: Diagnosis not present

## 2022-01-10 LAB — LIPID PANEL
Cholesterol: 120 mg/dL (ref 0–200)
HDL: 23 mg/dL — ABNORMAL LOW (ref >40)
LDL Cholesterol: 77 mg/dL (ref 0–99)
Total CHOL/HDL Ratio: 5.2 RATIO
Triglycerides: 101 mg/dL (ref <150)
VLDL: 20 mg/dL (ref 0–40)

## 2022-01-10 LAB — COMPREHENSIVE METABOLIC PANEL
ALT: 172 U/L — ABNORMAL HIGH (ref 0–44)
AST: 75 U/L — ABNORMAL HIGH (ref 15–41)
Albumin: 2.9 g/dL — ABNORMAL LOW (ref 3.5–5.0)
Alkaline Phosphatase: 109 U/L (ref 38–126)
Anion gap: 11 (ref 5–15)
BUN: 45 mg/dL — ABNORMAL HIGH (ref 8–23)
CO2: 23 mmol/L (ref 22–32)
Calcium: 8.8 mg/dL — ABNORMAL LOW (ref 8.9–10.3)
Chloride: 102 mmol/L (ref 98–111)
Creatinine, Ser: 1.28 mg/dL — ABNORMAL HIGH (ref 0.61–1.24)
GFR, Estimated: 60 mL/min — ABNORMAL LOW (ref >60)
Glucose, Bld: 249 mg/dL — ABNORMAL HIGH (ref 70–99)
Potassium: 3.9 mmol/L (ref 3.5–5.1)
Sodium: 136 mmol/L (ref 135–145)
Total Bilirubin: 1 mg/dL (ref 0.3–1.2)
Total Protein: 6.5 g/dL (ref 6.5–8.1)

## 2022-01-10 LAB — ECHOCARDIOGRAM COMPLETE
AR max vel: 2.26 cm2
AV Area VTI: 2.08 cm2
AV Area mean vel: 2.13 cm2
AV Mean grad: 2 mmHg
AV Peak grad: 4 mmHg
Ao pk vel: 1 m/s
Area-P 1/2: 4.68 cm2
Calc EF: 14.3 %
Height: 69 in
MV M vel: 4.08 m/s
MV Peak grad: 66.4 mmHg
P 1/2 time: 727 msec
Radius: 0.5 cm
S' Lateral: 4.6 cm
Single Plane A2C EF: 14.5 %
Single Plane A4C EF: 15.7 %
Weight: 3179.2 oz

## 2022-01-10 LAB — GLUCOSE, CAPILLARY
Glucose-Capillary: 255 mg/dL — ABNORMAL HIGH (ref 70–99)
Glucose-Capillary: 73 mg/dL (ref 70–99)
Glucose-Capillary: 233 mg/dL — ABNORMAL HIGH (ref 70–99)
Glucose-Capillary: 82 mg/dL (ref 70–99)

## 2022-01-10 LAB — HEMOGLOBIN A1C
Hgb A1c MFr Bld: 8.3 % — ABNORMAL HIGH (ref 4.8–5.6)
Mean Plasma Glucose: 191.51 mg/dL

## 2022-01-10 LAB — CBC
HCT: 30.8 % — ABNORMAL LOW (ref 39.0–52.0)
Hemoglobin: 9.9 g/dL — ABNORMAL LOW (ref 13.0–17.0)
MCH: 30 pg (ref 26.0–34.0)
MCHC: 32.1 g/dL (ref 30.0–36.0)
MCV: 93.3 fL (ref 80.0–100.0)
Platelets: 279 10*3/uL (ref 150–400)
RBC: 3.3 MIL/uL — ABNORMAL LOW (ref 4.22–5.81)
RDW: 15.2 % (ref 11.5–15.5)
WBC: 10.5 10*3/uL (ref 4.0–10.5)
nRBC: 0 % (ref 0.0–0.2)

## 2022-01-10 LAB — BRAIN NATRIURETIC PEPTIDE: B Natriuretic Peptide: 1972.3 pg/mL — ABNORMAL HIGH (ref 0.0–100.0)

## 2022-01-10 MED ORDER — PERFLUTREN LIPID MICROSPHERE
1.0000 mL | INTRAVENOUS | Status: AC | PRN
  Administered 2022-01-10: 2 mL via INTRAVENOUS

## 2022-01-10 NOTE — TOC Initial Note (Signed)
Transition of Care [facility]) - Initial/Assessment Note    Patient Details  Name: Phillip Rich MRN: [id number] Date of Birth: 04-24-1951  Transition of Care [facility]) CM/SW Contact:    Dessa Phi, RN Phone Number: 01/10/2022, 1:16 PM  Clinical Narrative:  PT eval await recc.                 Expected Discharge Plan: Home/Self Care Barriers to Discharge: Continued Medical Work up   Patient Goals and CMS Choice Patient states their goals for this hospitalization and ongoing recovery are::  (Home)      Expected Discharge Plan and Services Expected Discharge Plan: Home/Self Care   Discharge Planning Services: CM Consult   Living arrangements for the past 2 months: Single Family Home                                      Prior Living Arrangements/Services Living arrangements for the past 2 months: Single Family Home Lives with:: Self                   Activities of Daily Living Home Assistive Devices/Equipment: CBG Meter ADL Screening (condition at time of admission) Patient's cognitive ability adequate to safely complete daily activities?: Yes Is the patient deaf or have difficulty hearing?: No Does the patient have difficulty seeing, even when wearing glasses/contacts?: No Does the patient have difficulty concentrating, remembering, or making decisions?: No Patient able to express need for assistance with ADLs?: Yes Does the patient have difficulty dressing or bathing?: No Independently performs ADLs?: Yes (appropriate for developmental age) Does the patient have difficulty walking or climbing stairs?: No Weakness of Legs: None Weakness of Arms/Hands: None  Permission Sought/Granted                  Emotional Assessment              Admission diagnosis:  Acute exacerbation of CHF (congestive heart failure) ([place]) [I50.9] Congestive heart failure, unspecified HF chronicity, unspecified heart failure type ([place]) [I50.9] Acute pulmonary embolism,  unspecified pulmonary embolism type, unspecified whether acute cor pulmonale present ([place]) [I26.99] Patient Active Problem List   Diagnosis Date Noted   Acute exacerbation of CHF (congestive heart failure) ([place]) 01/09/2022   DM2 (diabetes mellitus, type 2) ([place]) 01/09/2022   HTN (hypertension) 01/09/2022   Elevated troponin 01/09/2022   Acute renal failure ([place]) 01/09/2022   Leukocytosis 01/09/2022   ILD (interstitial lung disease) ([place]) 12/19/2010   Tobacco abuse 12/19/2010   GERD (gastroesophageal reflux disease) 12/19/2010   Cough 10/27/2010   Anxiety 10/27/2010   SOB (shortness of breath) 10/27/2010   Lymph node enlargement 10/27/2010   PCP:  Lajean Manes, MD Pharmacy:   [id number] [place], [place]- [address]Phone:: [phone]:: [phone] [facility] (Specialty) [place]Phone: [phone]: [phone]    Social Determinants of Health (SDOH) Interventions    Readmission Risk Interventions     No data to display

## 2022-01-10 NOTE — Inpatient Diabetes Management (Signed)
Inpatient Diabetes Program Recommendations  AACE/ADA: New Consensus Statement on Inpatient Glycemic Control (2015)  Target Ranges:  Prepandial:   less than 140 mg/dL      Peak postprandial:   less than 180 mg/dL (1-2 hours)      Critically ill patients:  140 - 180 mg/dL   Lab Results  Component Value Date   GLUCAP 73 01/10/2022   HGBA1C 8.3 (H) 01/09/2022    Review of Glycemic Control  Diabetes history: DM2 Outpatient Diabetes medications: Novolog 12 units BID, metformin 1000 mg BID, Ozempic 0.5 mg weekly Current orders for Inpatient glycemic control: Novolog 0-15 TID with meals and 0-5 HS + 10 units BID  HgbA1C 8.3%  Inpatient Diabetes Program Recommendations:    Consider decreasing Novolog to 6 units TID  Spoke with pt at bedside. States he doesn't always take his insulin as prescribed. He may not eat first meal of the day until 8 pm and then he might forget to take his insulin. Pt states he usually takes 12 units 1-2x/day most of the time. Does not check blood sugars. Michela Pitcher his brother died and he's been using his brother's insulin pens (could not tell me whether it was Novolog or Novolog 70/30), said he thought it had numbers on the pen. Discussed importance of taking his insulin as prescribed and f/u with PCP. Pt said he just might not remember.  Continue to follow.  Thank you. Lorenda Peck, RD, LDN, [place] Inpatient Diabetes Coordinator [phone]

## 2022-01-10 NOTE — Progress Notes (Signed)
PT Cancellation Note  Patient Details Name: Phillip Rich MRN: [id number] DOB: 04-25-1951   Cancelled Treatment:    Reason Eval/Treat Not Completed: Fatigue/lethargy limiting ability to participate Pt laying on window seat on arrival and stated he just got comfortable.  Pt closed his eyes and then no longer addressed therapist.  Will check back as schedule permits.   Myrtis Hopping [place] 01/10/2022, 9:32 AM Jannette Spanner PT, DPT Physical Therapist Acute Rehabilitation Services Preferred contact method: Secure Chat Weekend Pager Only: [phone] Office: [phone]

## 2022-01-10 NOTE — Progress Notes (Signed)
Bilateral lower extremity venous duplex has been completed. Preliminary results can be found in CV Proc through chart review.   01/10/22 10:49 AM Carlos Levering RVT

## 2022-01-10 NOTE — Evaluation (Signed)
Physical Therapy One Time Evaluation Patient Details Name: Phillip Rich MRN: [id number] DOB: 1951/04/12 Today's Date: 01/10/2022  History of Present Illness  71 y.o. male with past medical history significant for  type 2 diabetes mellitus, HTN, BPH, peripheral neuropathy, depression, history of left renal mass s/p partial nephrectomy, history of previous tobacco use disorder who presents to [facility] ED on 8/31 with progressive shortness of breath.  Patient was recently seen in the ED on 8/27 and diagnosed with acute pulmonary embolism and concern for vascular congestion from underlying CHF; but unfortunately patient left AMA but was prescribed Eliquis and Lasix on discharge from the ED.  Pt returned to ED and admitted 01/09/22 for management of CHF exacerbation that is undifferentiated and recent pulmonary embolism.  Clinical Impression  Patient evaluated by Physical Therapy with no further acute PT needs identified. All education has been completed and the patient has no further questions.  Pt ambulated in hallway and reports symptom improvement since admission.  Pt up ad lib in room per RN.  No follow-up Physical Therapy or equipment needs identified at this time. PT is signing off. Thank you for this referral.        Recommendations for follow up therapy are one component of a multi-disciplinary discharge planning process, led by the attending physician.  Recommendations may be updated based on patient status, additional functional criteria and insurance authorization.  Follow Up Recommendations No PT follow up      Assistance Recommended at Discharge None  Patient can return home with the following       Equipment Recommendations None recommended by PT  Recommendations for Other Services       Functional Status Assessment Patient has not had a recent decline in their functional status     Precautions / Restrictions Precautions Precautions: None      Mobility  Bed Mobility Overal  bed mobility: Independent                  Transfers Overall transfer level: Independent                      Ambulation/Gait Ambulation/Gait assistance: Supervision, Modified independent (Device/Increase time) Gait Distance (Feet): 400 Feet Assistive device: None Gait Pattern/deviations: Knee hyperextension - left, Step-through pattern       General Gait Details: pt reports SOB much improved since admission, SPO2 95-100% on room air, HR 114 bpm  Stairs            Wheelchair Mobility    Modified Rankin (Stroke Patients Only)       Balance Overall balance assessment: No apparent balance deficits (not formally assessed)                                           Pertinent Vitals/Pain Pain Assessment Pain Assessment: No/denies pain    Home Living Family/patient expects to be discharged to:: Private residence Living Arrangements: Alone             Home Layout: One level Home Equipment: None      Prior Function Prior Level of Function : Independent/Modified Independent                     Hand Dominance        Extremity/Trunk Assessment        Lower Extremity Assessment Lower Extremity Assessment:  Overall [facility] for tasks assessed    Cervical / Trunk Assessment Cervical / Trunk Assessment: Normal  Communication   Communication: No difficulties  Cognition Arousal/Alertness: Awake/alert Behavior During Therapy: WFL for tasks assessed/performed Overall Cognitive Status: Within Functional Limits for tasks assessed                                          General Comments      Exercises     Assessment/Plan    PT Assessment Patient does not need any further PT services  PT Problem List         PT Treatment Interventions      PT Goals (Current goals can be found in the Care Plan section)  Acute Rehab PT Goals PT Goal Formulation: All assessment and education complete, DC  therapy    Frequency       Co-evaluation               AM-PAC PT "6 Clicks" Mobility  Outcome Measure Help needed turning from your back to your side while in a flat bed without using bedrails?: None Help needed moving from lying on your back to sitting on the side of a flat bed without using bedrails?: None Help needed moving to and from a bed to a chair (including a wheelchair)?: None Help needed standing up from a chair using your arms (e.g., wheelchair or bedside chair)?: None Help needed to walk in hospital room?: None Help needed climbing 3-5 steps with a railing? : A Little 6 Click Score: 23    End of Session   Activity Tolerance: Patient tolerated treatment well Patient left: in bed;with call bell/phone within reach Nurse Communication: Mobility status PT Visit Diagnosis: Difficulty in walking, not elsewhere classified (R26.2)    Time: 7412-8786 PT Time Calculation (min) (ACUTE ONLY): 9 min   Charges:   PT Evaluation $PT Eval Low Complexity: 1 Low         Kati PT, DPT Physical Therapist Acute Rehabilitation Services Preferred contact method: Secure Chat Weekend Pager Only: [phone] Office: [phone]   Myrtis Hopping [place] 01/10/2022, 3:28 PM

## 2022-01-10 NOTE — Progress Notes (Signed)
PROGRESS NOTE    Phillip Rich  KYH:[id number] DOB: August 21, 1950 DOA: 01/09/2022 PCP: Lajean Manes, MD    Brief Narrative:   Phillip Rich is a 71 y.o. male with past medical history significant for  type 2 diabetes mellitus, HTN, BPH, peripheral neuropathy, depression, history of left renal mass s/p partial nephrectomy, history of previous tobacco use disorder who presents to [facility] ED on 8/31 with progressive shortness of breath.  Patient was recently seen in the ED on 8/27 and diagnosed with acute pulmonary embolism and concern for vascular congestion from underlying CHF; but unfortunately patient left AMA but was prescribed Eliquis and Lasix on discharge from the ED.  Initially patient reports symptom onset roughly 1 week ago after recent travel to [place] in which he flew on an airplane and following return started experiencing some weight gain and lower extremity edema.  He has been having difficulty laying flat and finding a position of comfort.  He also reports difficulty ambulating even 20 feet without having stopping and resting.  He was seen by his PCP and directed to the ED for further evaluation.  He reports a roughly 11 pound weight gain over the last week.  He reports compliance with the majority of his medications but does admit to missing some of his insulin occasionally.  He does not recall ever having an echocardiogram, never seen a cardiologist in the past but was recently referred to a pulmonologist with also concerns about obstructive sleep apnea.  Patient denies headache, no dizziness, no chest pain, no fever/chills/night sweats, no nausea/vomiting/diarrhea, no abdominal pain, no focal weakness, no cough, no paresthesias.   In the ED, temperature 98.0 F, HR 109, RR 15, BP 133/87, SPO2 100% on room air.  WBC 11.7, hemoglobin 11.1, platelets 366.  Sodium 133, potassium 4.5, chloride 102, CO2 24, glucose 330, BUN 45, creatinine 1.30.  High sensitive troponin 100>99.  BNP 2249.9.   Chest x-ray with bibasilar opacities representing pneumonia or pulmonary edema with interval increase in central venous congestion.  EKG personally reviewed with sinus tachycardia, rate 109, T wave inversion in lead I/aVL and V6 which is similar in appearance to EKG dated 01/05/2022.  Patient was given an albuterol neb treatment, Lasix in the ED.  EDP consulted [facility] for further evaluation and management of CHF exacerbation that is undifferentiated and recent pulmonary embolism.  Assessment & Plan:   Acute congestive heart failure exacerbation, undifferentiated Patient presenting to ED with progressive shortness of breath, worse with exertion and reported 11 pound weight gain over the last 1 week.  No previous history of known CHF, has never seen a cardiologist in the past and no echocardiogram available for review in EMR.  Patient with elevated BNP of 2249, chest x-ray with findings of progressive vascular congestion since 4 days prior and physical exam findings of positive JVP and lower extremity edema. -- net negative 928m since admission -- weight 90.1kg today -- TTE pending -- Furosemide 40 mg IV every 12 hours -- Fluid restriction 1800 mL/day -- Strict I's and O's and daily weights -- Monitor BMP daily   Acute pulmonary embolism [place]  Since has had increased shortness of breath, lower extremity edema and weight gain.  Diagnosed 4 days prior in the ED with acute pulmonary embolism; unfortunate left AMA but was started on Eliquis.  Patient denies chest pain. -- TTE pending as above -- vascular duplex ultrasound bilateral lower extremities: pending -- Continue Eliquis  Acute renal failure Creatinine elevated 1.30 on admission, was 0.91 on 01/05/2022.  Suspect etiology from volume overload. -- Cr 1.30>1.28 -- Hold home losartan -- Lasix as above -- Avoid nephrotoxins, renally dose all medications -- BMP daily   Elevated  troponin High sensitive troponin elevated 100 followed by 99.  EKG with nonspecific T wave inversions lead I, aVL and V6 that were present previously on 8/27 without change.  Patient denies chest pain.  Etiology likely secondary to type II demand ischemia in the setting of volume overload as above. -- Monitor on telemetry   Leukocytosis WBC slightly elevated 11.7.  Patient denies cough, afebrile and chest x-ray findings likely more related to pulmonary edema. -- WBC 11.7>>10.5 and PCT <0.10 -- No indication for antibiotics; monitor clinically   Hyponatremia Etiology likely hypervolemic hyponatremia in the setting of volume overload.  Sodium 133 on admission.  Treatment as above with IV diuresis. -- Na 133>136 -- Repeat BMP in a.m.   Essential hypertension -- Hold home ARB/ACE inhibitor for AKI -- Hydralazine 25 mg p.o. q8h PRN SBP >165 or DBP >110   Type 2 diabetes mellitus, with hyperglycemia Home regimen includes metformin 1000 mg p.o. twice daily, semaglutide 0.5 mg injected once weekly, Humalog 12 units twice daily.  Hemoglobin A1c 8.3, not optimally controlled. -- NovoLog 10 units BID AC -- Moderate SSI for coverage -- CBG before every meal/at bedtime   Allergies --Claritin, Flonase   Weakness/debility/deconditioning: Patient lives alone, having increased difficulty ambulating.  Does not use any walking aid at baseline. --PT evaluation pending     DVT prophylaxis:  apixaban (ELIQUIS) tablet 10 mg  apixaban (ELIQUIS) tablet 5 mg    Code Status: Full Code Family Communication: No family present at bedside this morning  Disposition Plan:  Level of care: Telemetry Status is: Inpatient Remains inpatient appropriate because: Pending TTE, vascular duplex ultrasound, IV diuresis, anticipate discharge home in 2-3 days    Consultants:  None  Procedures:  TTE: Pending Vascular duplex ultrasound: Pending  Antimicrobials:  None   Subjective: Patient seen examined  bedside, resting comfortably.  Sitting at edge of bed.  Feels his overall shortness of breath is much improved but not back to baseline.  States was able to sleep somewhat overnight.  Continues with dyspnea while walking.  Awaiting echocardiogram, lower extremity ultrasound, PT evaluation and maintains on IV diuresis.  No other questions or concerns at this time.  Denies headache, no dizziness, no chest pain, no palpitations, no abdominal pain, no fever/chills/night sweats, no nausea/vomiting/diarrhea, no cough/congestion, no fatigue, no paresthesias, no focal weakness.  Objective: Vitals:   01/10/22 0343 01/10/22 0400 01/10/22 0600 01/10/22 0735  BP: 121/81   (!) 120/103  Pulse: (!) 109   (!) 108  Resp: (!) 21 17 (!) 7 20  Temp: 97.7 F (36.5 C)   97.8 F (36.6 C)  TempSrc:      SpO2: 97%   100%  Weight:   90.1 kg   Height:   '5\' 9"'$  (1.753 m)     Intake/Output Summary (Last 24 hours) at 01/10/2022 1040 Last data filed at 01/10/2022 0815 Gross per 24 hour  Intake 240 ml  Output 1250 ml  Net -1010 ml   Filed Weights   01/10/22 0600  Weight: 90.1 kg    Examination:  Physical Exam: GEN: NAD, alert and oriented x 3, wd/wn HEENT: NCAT, PERRL, EOMI, sclera clear, MMM PULM: Breath sounds diminished bilateral bases with diffuse crackles, no wheezing, normal respiratory effort  without accessory muscle use, on room air CV: RRR w/o M/G/R GI: abd soft, NTND, NABS, no R/G/M MSK: 2+ pitting edema bilateral lower extremities up to knee, muscle strength globally intact 5/5 bilateral upper/lower extremities NEURO: CN II-XII intact, no focal deficits, sensation to light touch intact PSYCH: normal mood/affect Integumentary: Chronic venous changes to bilateral lower extremities, multiple areas of ecchymosis to extremities in various stages of healing, otherwise no other concerning rashes/lesions/wounds to exposed skin regions.     Data Reviewed: I have personally reviewed following labs and  imaging studies  CBC: Recent Labs  Lab 01/05/22 0901 01/09/22 1400 01/10/22 0526  WBC 10.2 11.7* 10.5  NEUTROABS  --  9.0*  --   HGB 10.3* 11.1* 9.9*  HCT 30.9* 35.2* 30.8*  MCV 89.8 94.9 93.3  PLT 293 366 299   Basic Metabolic Panel: Recent Labs  Lab 01/05/22 0901 01/09/22 1400 01/10/22 0526  NA 134* 133* 136  K 4.2 4.5 3.9  CL 102 102 102  CO2 '22 24 23  '$ GLUCOSE 255* 330* 249*  BUN 23 45* 45*  CREATININE 0.91 1.30* 1.28*  CALCIUM 8.9 8.9 8.8*   GFR: Estimated Creatinine Clearance: 58.8 mL/min (A) (by C-G formula based on SCr of 1.28 mg/dL (H)). Liver Function Tests: Recent Labs  Lab 01/10/22 0526  AST 75*  ALT 172*  ALKPHOS 109  BILITOT 1.0  PROT 6.5  ALBUMIN 2.9*   No results for input(s): "LIPASE", "AMYLASE" in the last 168 hours. No results for input(s): "AMMONIA" in the last 168 hours. Coagulation Profile: No results for input(s): "INR", "PROTIME" in the last 168 hours. Cardiac Enzymes: No results for input(s): "CKTOTAL", "CKMB", "CKMBINDEX", "TROPONINI" in the last 168 hours. BNP (last 3 results) No results for input(s): "PROBNP" in the last 8760 hours. HbA1C: Recent Labs    01/09/22 2139  HGBA1C 8.3*   CBG: Recent Labs  Lab 01/09/22 2159 01/10/22 0808  GLUCAP 281* 255*   Lipid Profile: Recent Labs    01/10/22 0526  CHOL 120  HDL 23*  LDLCALC 77  TRIG 101  CHOLHDL 5.2   Thyroid Function Tests: Recent Labs    01/09/22 2139  TSH 2.835   Anemia Panel: No results for input(s): "VITAMINB12", "FOLATE", "FERRITIN", "TIBC", "IRON", "RETICCTPCT" in the last 72 hours. Sepsis Labs: Recent Labs  Lab 01/09/22 [place] <0.10    Recent Results (from the past 240 hour(s))  Resp Panel by RT-PCR (Flu A&B, Covid) Anterior Nasal Swab     Status: None   Collection Time: 01/05/22  1:24 PM   Specimen: Anterior Nasal Swab  Result Value Ref Range Status   SARS Coronavirus 2 by RT PCR NEGATIVE NEGATIVE Final    Comment:  (NOTE) SARS-CoV-2 target nucleic acids are NOT DETECTED.  The SARS-CoV-2 RNA is generally detectable in upper respiratory specimens during the acute phase of infection. The lowest concentration of SARS-CoV-2 viral copies this assay can detect is 138 copies/mL. A negative result does not preclude SARS-Cov-2 infection and should not be used as the sole basis for treatment or other patient management decisions. A negative result may occur with  improper specimen collection/handling, submission of specimen other than nasopharyngeal swab, presence of viral mutation(s) within the areas targeted by this assay, and inadequate number of viral copies(<138 copies/mL). A negative result must be combined with clinical observations, patient history, and epidemiological information. The expected result is Negative.  Fact Sheet for Patients:  EntrepreneurPulse.com.au  Fact Sheet for Healthcare Providers:  IncredibleEmployment.be  This test is no t yet approved or cleared by the [place] and  has been authorized for detection and/or diagnosis of SARS-CoV-2 by FDA under an Emergency Use Authorization (EUA). This EUA will remain  in effect (meaning this test can be used) for the duration of the COVID-19 declaration under Section 564(b)(1) of the Act, 21 U.S.C.section 360bbb-3(b)(1), unless the authorization is terminated  or revoked sooner.       Influenza A by PCR NEGATIVE NEGATIVE Final   Influenza B by PCR NEGATIVE NEGATIVE Final    Comment: (NOTE) The Xpert Xpress SARS-CoV-2/FLU/RSV plus assay is intended as an aid in the diagnosis of influenza from Nasopharyngeal swab specimens and should not be used as a sole basis for treatment. Nasal washings and aspirates are unacceptable for Xpert Xpress SARS-CoV-2/FLU/RSV testing.  Fact Sheet for Patients: EntrepreneurPulse.com.au  Fact Sheet for Healthcare  Providers: IncredibleEmployment.be  This test is not yet approved or cleared by the [place] FDA and has been authorized for detection and/or diagnosis of SARS-CoV-2 by FDA under an Emergency Use Authorization (EUA). This EUA will remain in effect (meaning this test can be used) for the duration of the COVID-19 declaration under Section 564(b)(1) of the Act, 21 U.S.C. section 360bbb-3(b)(1), unless the authorization is terminated or revoked.  Performed at [facility], [address] [place], [place] [address]          Radiology Studies: DG Chest 2 View  Result Date: 01/09/2022 CLINICAL DATA:  Short of breath.  Recent pulmonary embolism EXAM: CHEST - 2 VIEW COMPARISON:  Chest CT 01/05/2022, radiograph 01/05/2022 FINDINGS: Stable cardiac silhouette. Low lung volumes. There is bibasilar airspace opacities and small effusions similar comparison exam. There is interval increase in central venous congestion compared to prior. IMPRESSION: *Bibasilar opacities representing pneumonia or pulmonary edema. *Interval increase in central venous congestion. *Low lung volumes and pleural effusions. Electronically Signed   By: Suzy Bouchard M.D.   On: 01/09/2022 13:43        Scheduled Meds:  amLODipine  2.5 mg Oral QHS   apixaban  10 mg Oral BID   Followed by   Derrill Memo ON 01/12/2022] apixaban  5 mg Oral BID   DULoxetine  60 mg Oral Daily   furosemide  40 mg Intravenous BID   insulin aspart  0-15 Units Subcutaneous TID WC   insulin aspart  0-5 Units Subcutaneous QHS   insulin aspart  10 Units Subcutaneous BID WC   pantoprazole  80 mg Oral Daily   simvastatin  20 mg Oral q morning   zolpidem  5 mg Oral QHS   Continuous Infusions:   LOS: 1 day    Time spent: 51 minutes spent on chart review, discussion with nursing staff, consultants, updating family and interview/physical exam; more than 50% of that time was spent in counseling and/or  coordination of care.    Tiye Huwe J [place] DO [facility] Available via Epic secure chat 7am-7pm After these hours, please refer to coverage provider listed on [email or web address] 01/10/2022, 10:40 AM

## 2022-01-11 ENCOUNTER — Encounter (HOSPITAL_COMMUNITY): Payer: Self-pay | Admitting: Internal Medicine

## 2022-01-11 DIAGNOSIS — I2609 Other pulmonary embolism with acute cor pulmonale: Secondary | ICD-10-CM | POA: Diagnosis not present

## 2022-01-11 DIAGNOSIS — I10 Essential (primary) hypertension: Secondary | ICD-10-CM | POA: Diagnosis not present

## 2022-01-11 DIAGNOSIS — N179 Acute kidney failure, unspecified: Secondary | ICD-10-CM | POA: Diagnosis not present

## 2022-01-11 DIAGNOSIS — R778 Other specified abnormalities of plasma proteins: Secondary | ICD-10-CM | POA: Diagnosis not present

## 2022-01-11 DIAGNOSIS — I5021 Acute systolic (congestive) heart failure: Secondary | ICD-10-CM | POA: Diagnosis not present

## 2022-01-11 DIAGNOSIS — E119 Type 2 diabetes mellitus without complications: Secondary | ICD-10-CM | POA: Diagnosis not present

## 2022-01-11 DIAGNOSIS — I509 Heart failure, unspecified: Secondary | ICD-10-CM | POA: Diagnosis not present

## 2022-01-11 LAB — BASIC METABOLIC PANEL
Anion gap: 7 (ref 5–15)
BUN: 41 mg/dL — ABNORMAL HIGH (ref 8–23)
CO2: 29 mmol/L (ref 22–32)
Calcium: 8.9 mg/dL (ref 8.9–10.3)
Chloride: 100 mmol/L (ref 98–111)
Creatinine, Ser: 1.37 mg/dL — ABNORMAL HIGH (ref 0.61–1.24)
GFR, Estimated: 55 mL/min — ABNORMAL LOW (ref >60)
Glucose, Bld: 159 mg/dL — ABNORMAL HIGH (ref 70–99)
Potassium: 4.2 mmol/L (ref 3.5–5.1)
Sodium: 136 mmol/L (ref 135–145)

## 2022-01-11 LAB — GLUCOSE, CAPILLARY
Glucose-Capillary: 260 mg/dL — ABNORMAL HIGH (ref 70–99)
Glucose-Capillary: 253 mg/dL — ABNORMAL HIGH (ref 70–99)
Glucose-Capillary: 132 mg/dL — ABNORMAL HIGH (ref 70–99)
Glucose-Capillary: 185 mg/dL — ABNORMAL HIGH (ref 70–99)

## 2022-01-11 LAB — APTT: aPTT: 47 seconds — ABNORMAL HIGH (ref 24–36)

## 2022-01-11 LAB — HEPARIN LEVEL (UNFRACTIONATED): Heparin Unfractionated: >1.1 IU/mL — ABNORMAL HIGH (ref 0.30–0.70)

## 2022-01-11 MED ORDER — INSULIN ASPART 100 UNIT/ML IJ SOLN
6.0000 [IU] | Freq: Three times a day (TID) | INTRAMUSCULAR | Status: DC
Start: 2022-01-11 — End: 2022-01-21
  Administered 2022-01-11 – 2022-01-21 (×24): 6 [IU] via SUBCUTANEOUS

## 2022-01-11 MED ORDER — HEPARIN (PORCINE) 25000 UT/250ML-% IV SOLN
1400.0000 [IU]/h | INTRAVENOUS | Status: DC
  Administered 2022-01-11: 1400 [IU]/h via INTRAVENOUS
  Filled 2022-01-11: qty 250

## 2022-01-11 NOTE — Consult Note (Signed)
Cardiology Consultation:   Patient ID: Phillip Rich; [id number]; 1950/10/20   Admit date: 01/09/2022 Date of Consult: 01/11/2022  Primary Care Provider: Lajean Manes, MD Primary Cardiologist: None  Primary Electrophysiologist:  none   Patient Profile:   Phillip Rich is a 71 y.o. male with a hx of DM, HTN and sleep apnea who is being seen today for the evaluation of dyspnea at the request of Dr. [place]  History of Present Illness:   Phillip Rich has no past cardiac history. He presented for increasing SOB.    He had been in the ED in 8/27 and found to have PE.  He left the hospital AMA because he was afraid having lost a couple of siblings in the hospital..  He was given Eliquis and Lasix to take and returned on the 31st with worsening dyspnea.    He has no prior cardiac history.  He does he is being evaluated for sleep apnea.  In the ED his BNP was 2249.  Troponin was mildly elevated at 100.  Chest x-ray suggested pneumonia versus edema.  EKG showed no acute changes.  He was treated with bronchodilators and diuretics.  Echo yesterday demonstrated a severely reduced EF of 15 - 20%.  This is global hypokinesis.    He reports that he has been getting more short of breath for the last couple of years.  He said this has been slowly progressive to the point where walking a short distance down to his mailbox and back up he has to stop.  He is not describing resting shortness of breath.  He got more acutely short of breath recently after taking a car trip to [facility].  He does not have chest pressure, neck or arm discomfort.  He has intermittent lower extremity swelling.  He has not had any PND or orthopnea.  He really does not notice palpitations, presyncope or syncope.   Past Medical History:  Diagnosis Date   Anxiety    BPH (benign prostatic hyperplasia)    Complication of anesthesia    Diabetes mellitus    DJD (degenerative joint disease)    GERD (gastroesophageal  reflux disease)    HTN (hypertension)    Hyperlipidemia    ILD (interstitial lung disease) ([place])    Left renal mass    Melanoma ([place])    Mild sleep apnea    PONV (postoperative nausea and vomiting)     Past Surgical History:  Procedure Laterality Date   COLONOSCOPY WITH PROPOFOL N/A 04/03/2014   Procedure: COLONOSCOPY WITH PROPOFOL;  Surgeon: Garlan Fair, MD;  Location: [facility];  Service: Endoscopy;  Laterality: N/A;   KNEE ARTHROSCOPY Right    x2 right, x5 left   LASIK     MELANOMA EXCISION Left    NEPHRECTOMY Left 2022   partial   PROSTATE ABLATION     TOTAL KNEE ARTHROPLASTY     left   VASECTOMY       Home Medications:  Prior to Admission medications   Medication Sig Start Date End Date Taking? Authorizing Provider  albuterol (VENTOLIN HFA) 108 (90 Base) MCG/ACT inhaler Inhale 1-2 puffs into the lungs every 6 (six) hours as needed for wheezing or shortness of breath. 01/05/22  Yes Kneller, [place] K, DO  amLODipine (NORVASC) 2.5 MG tablet Take 2.5 mg by mouth at bedtime. 12/18/21  Yes [provider]  APIXABAN Arne [place]) VTE STARTER PACK ('10MG'$  AND '5MG'$ ) Take as directed on package: start with two-'5mg'$  tablets  twice daily for 7 days. On day 8, switch to one-'5mg'$  tablet twice daily. 01/05/22  Yes Kneller, Norman Herrlich, DO  Cholecalciferol (VITAMIN D PO) Take 1 tablet by mouth every morning.   Yes [provider]  fluticasone (FLONASE) 50 MCG/ACT nasal spray Place 1 spray into both nostrils daily as needed for allergies or rhinitis.   Yes [provider]  furosemide (LASIX) 20 MG tablet Take 40 mg by mouth daily.   Yes [provider]  insulin lispro (HUMALOG KWIKPEN) 100 UNIT/ML KwikPen Inject 12 Units into the skin 2 (two) times daily as needed (high blood sugar).   Yes [provider]  irbesartan (AVAPRO) 150 MG tablet Take 150 mg by mouth daily. 09/03/21  Yes [provider]  levocetirizine (XYZAL) 5 MG tablet Take 5 mg by  mouth daily.   Yes [provider]  metFORMIN (GLUCOPHAGE) 1000 MG tablet Take 1,000 mg by mouth 2 (two) times daily.  03/06/10  Yes [provider]  omeprazole (PRILOSEC) 20 MG capsule Take 1 tablet by mouth every morning.  08/06/10  Yes [provider]  oxymetazoline (AFRIN) 0.05 % nasal spray Place 1 spray into both nostrils 2 (two) times daily as needed for congestion.   Yes [provider]  simvastatin (ZOCOR) 20 MG tablet Take 1 tablet by mouth every morning.  09/11/10  Yes [provider]  vitamin B-12 (CYANOCOBALAMIN) 100 MCG tablet Take 100 mcg by mouth daily.   Yes [provider]  zolpidem (AMBIEN) 10 MG tablet Take 10 mg by mouth at bedtime. 01/01/22  Yes [provider]  DULoxetine (CYMBALTA) 60 MG capsule Take 60 mg by mouth daily. 05/28/21   [provider]  ipratropium (ATROVENT) 0.03 % nasal spray Place 2 sprays into the nose 2 (two) times daily. 07/02/11 03/16/14  Harrison Mons, PA  losartan (COZAAR) 25 MG tablet Take 1 tablet (25 mg total) by mouth daily. Please HOLD rx  Until patient calls for refill. Patient not taking: Reported on 01/09/2022 12/19/10   Brand Males, MD  Semaglutide,0.25 or 0.'5MG'$ /DOS, (OZEMPIC, 0.25 OR 0.5 MG/DOSE,) 2 MG/3ML SOPN Inject 0.5 mg into the skin once a week.    [provider]    Inpatient Medications: Scheduled Meds:  amLODipine  2.5 mg Oral QHS   apixaban  10 mg Oral BID   Followed by   Derrill Memo ON 01/12/2022] apixaban  5 mg Oral BID   DULoxetine  60 mg Oral Daily   furosemide  40 mg Intravenous BID   insulin aspart  0-15 Units Subcutaneous TID WC   insulin aspart  0-5 Units Subcutaneous QHS   insulin aspart  6 Units Subcutaneous TID WC   pantoprazole  80 mg Oral Daily   simvastatin  20 mg Oral q morning   zolpidem  5 mg Oral QHS   Continuous Infusions:  PRN Meds: acetaminophen **OR** acetaminophen, albuterol, fluticasone, guaiFENesin-dextromethorphan,  hydrALAZINE, ondansetron **OR** ondansetron (ZOFRAN) IV, polyethylene glycol  Allergies:    Allergies  Allergen Reactions   Codeine Nausea And Vomiting   Crestor [Rosuvastatin Calcium] Other (See Comments)    Leg aches   Erythromycin Rash    All Mycin drugs    Social History:   Social History   Socioeconomic History   Marital status: Single    Spouse name: Not on file   Number of children: Not on file   Years of education: Not on file   Highest education level: Not on file  Occupational History  Occupation: retired  Tobacco Use   Smoking status: Former    Packs/day: 1.00    Years: 50.00    Total pack years: 50.00    Types: Cigarettes   Smokeless tobacco: Never  Substance and Sexual Activity   Alcohol use: Yes    Comment: rare occ.   Drug use: Yes    Types: Marijuana    Comment: 07/29/21 3 x week to help sleep   Sexual activity: Not on file  Other Topics Concern   Not on file  Social History Narrative   Not on file   Social Determinants of Health   Financial Resource Strain: Not on file  Food Insecurity: Not on file  Transportation Needs: Not on file  Physical Activity: Not on file  Stress: Not on file  Social Connections: Not on file  Intimate Partner Violence: Not on file    He does smoke marijuana nightly and has done this for 40 years.  Family History:    Family History  Problem Relation Age of Onset   Lung cancer Mother    Heart disease Father    Asthma Sister    Asthma Sister    Colon cancer Sister    Rheum arthritis Sister        x3     ROS:   All other ROS reviewed and negative.     Physical Exam/Data:   Vitals:   01/10/22 1320 01/10/22 2100 01/10/22 2238 01/11/22 0507  BP: 129/83 132/81  114/71  Pulse: (!) 55   97  Resp: 20   18  Temp: 98.2 F (36.8 C)  98.6 F (37 C) 98.5 F (36.9 C)  TempSrc:   Oral   SpO2: 100%  92% 97%  Weight:      Height:        Intake/Output Summary (Last 24 hours) at 01/11/2022 0749 Last data filed  at 01/10/2022 2059 Gross per 24 hour  Intake 720 ml  Output 2375 ml  Net -1655 ml   Filed Weights   01/10/22 0600  Weight: 90.1 kg   Body mass index is 29.34 kg/m.  GENERAL:  Well appearing HEENT:   Pupils equal round and reactive, fundi not visualized, oral mucosa unremarkable NECK:    Positive  jugular venous distention 8 cm at 45 degrees, waveform within normal limits, carotid upstroke brisk and symmetric, no bruits, no thyromegaly LYMPHATICS:  No cervical, inguinal adenopathy LUNGS:   Clear to auscultation bilaterally BACK:  No CVA tenderness CHEST:   Unremarkable HEART:  PMI not displaced or sustained,S1 and S2 within normal limits, no S3, no S4, no clicks, no rubs, no murmurs ABD:  Flat, positive bowel sounds normal in frequency in pitch, no bruits, no rebound, no guarding, no midline pulsatile mass, no hepatomegaly, no splenomegaly EXT:  2 plus pulses throughout,   moderate edema, no cyanosis no clubbing SKIN:  No rashes no nodules NEURO:   Cranial nerves II through XII grossly intact, motor grossly intact throughout PSYCH:    Cognitively intact, oriented to person place and time   EKG:  The EKG was personally reviewed and demonstrates: Sinus tachycardia, rate 109, leftward axis, no acute ST-T wave changes. Telemetry:  Telemetry was personally reviewed and demonstrates: Sinus tachycardia  Relevant CV Studies: Echo see below  Laboratory Data:  Chemistry Recent Labs  Lab 01/09/22 1400 01/10/22 0526 01/11/22 0456  NA 133* 136 136  K 4.5 3.9 4.2  CL 102 102 100  CO2 '24 23 29  '$ GLUCOSE  330* 249* 159*  BUN 45* 45* 41*  CREATININE 1.30* 1.28* 1.37*  CALCIUM 8.9 8.8* 8.9  GFRNONAA 59* 60* 55*  ANIONGAP '7 11 7    '$ Recent Labs  Lab 01/10/22 0526  PROT 6.5  ALBUMIN 2.9*  AST 75*  ALT 172*  ALKPHOS 109  BILITOT 1.0   Hematology Recent Labs  Lab 01/05/22 0901 01/09/22 1400 01/10/22 0526  WBC 10.2 11.7* 10.5  RBC 3.44* 3.71* 3.30*  HGB 10.3* 11.1* 9.9*  HCT  30.9* 35.2* 30.8*  MCV 89.8 94.9 93.3  MCH 29.9 29.9 30.0  MCHC 33.3 31.5 32.1  RDW 14.9 15.3 15.2  PLT 293 366 279   Cardiac EnzymesNo results for input(s): "TROPONINI" in the last 168 hours. No results for input(s): "TROPIPOC" in the last 168 hours.  BNP Recent Labs  Lab 01/05/22 1211 01/09/22 1400 01/10/22 0526  BNP 1,345.0* 2,249.9* 1,972.3*    DDimer  Recent Labs  Lab 01/05/22 0901  DDIMER 1.30*    Radiology/Studies:  ECHOCARDIOGRAM COMPLETE  Result Date: 01/10/2022    ECHOCARDIOGRAM REPORT   Patient Name:   Phillip Rich Date of Exam: 01/10/2022 Medical Rec #:  [id number]        Height:       69.0 in Accession #:    [id number]       Weight:       198.7 lb Date of Birth:  Jan 20, 1951        BSA:          2.060 m Patient Age:    79 years         BP:           120/113 mmHg Patient Gender: M                HR:           106 bpm. Exam Location:  Inpatient Procedure: 2D Echo and Intracardiac Opacification Agent Indications:    Dyspnea  History:        Patient has no prior history of Echocardiogram examinations.                 CHF, Signs/Symptoms:Shortness of Breath; Risk                 Factors:Hypertension and Diabetes.  Sonographer:    Harvie Junior Referring Phys: [id number] ERIC J [place] IMPRESSIONS  1. Left ventricular ejection fraction, by estimation, is 15-20%. The left ventricle has severely decreased function. The left ventricle demonstrates global hypokinesis. The left ventricular internal cavity size was mildly dilated. Left ventricular diastolic parameters are indeterminate.  2. Right ventricular systolic function is moderately reduced. The right ventricular size is mildly enlarged. There is mildly elevated pulmonary artery systolic pressure. The estimated right ventricular systolic pressure is 83.4 mmHg.  3. The mitral valve is grossly normal. Moderate mitral valve regurgitation - jet of MR is likely eccentric, and may be underestimated. No evidence of mitral stenosis.  4.  Tricuspid valve regurgitation is moderate.  5. The aortic valve is grossly normal. There is mild calcification of the aortic valve. Aortic valve regurgitation is not visualized. No aortic stenosis is present.  6. The inferior vena cava is normal in size with <50% respiratory variability, suggesting right atrial pressure of 8 mmHg. Conclusion(s)/Recommendation(s): No left ventricular mural or apical thrombus/thrombi. FINDINGS  Left Ventricle: Left ventricular ejection fraction, by estimation, is 15-20%. The left ventricle has severely decreased function. The left ventricle demonstrates global hypokinesis. The left ventricular  internal cavity size was mildly dilated. There is no left ventricular hypertrophy. Left ventricular diastolic parameters are indeterminate. Right Ventricle: The right ventricular size is mildly enlarged. No increase in right ventricular wall thickness. Right ventricular systolic function is moderately reduced. There is mildly elevated pulmonary artery systolic pressure. The tricuspid regurgitant velocity is 3.00 m/s, and with an assumed right atrial pressure of 8 mmHg, the estimated right ventricular systolic pressure is 39.7 mmHg. Left Atrium: Left atrial size was normal in size. Right Atrium: Right atrial size was normal in size. Pericardium: There is no evidence of pericardial effusion. Mitral Valve: The mitral valve is grossly normal. Moderate mitral valve regurgitation. No evidence of mitral valve stenosis. Tricuspid Valve: The tricuspid valve is normal in structure. Tricuspid valve regurgitation is moderate . No evidence of tricuspid stenosis. Aortic Valve: The aortic valve is grossly normal. There is mild calcification of the aortic valve. Aortic valve regurgitation is not visualized. Aortic regurgitation PHT measures 727 msec. No aortic stenosis is present. Aortic valve mean gradient measures 2.0 mmHg. Aortic valve peak gradient measures 4.0 mmHg. Aortic valve area, by VTI measures 2.08  cm. Pulmonic Valve: The pulmonic valve was normal in structure. Pulmonic valve regurgitation is trivial. No evidence of pulmonic stenosis. Aorta: The aortic root is normal in size and structure. Venous: The inferior vena cava is normal in size with less than 50% respiratory variability, suggesting right atrial pressure of 8 mmHg. IAS/Shunts: No atrial level shunt detected by color flow Doppler.  LEFT VENTRICLE PLAX 2D LVIDd:         5.60 cm      Diastology LVIDs:         4.60 cm      LV e' medial:    4.46 cm/s LV PW:         1.00 cm      LV E/e' medial:  22.0 LV IVS:        1.00 cm      LV e' lateral:   5.22 cm/s LVOT diam:     2.00 cm      LV E/e' lateral: 18.8 LV SV:         30 LV SV Index:   14 LVOT Area:     3.14 cm  LV Volumes (MOD) LV vol d, MOD A2C: 248.0 ml LV vol d, MOD A4C: 198.0 ml LV vol s, MOD A2C: 212.0 ml LV vol s, MOD A4C: 167.0 ml LV SV MOD A2C:     36.0 ml LV SV MOD A4C:     198.0 ml LV SV MOD BP:      31.5 ml RIGHT VENTRICLE RV Basal diam:  4.40 cm RV Mid diam:    4.00 cm RV FAC:         22.8 % LEFT ATRIUM             Index        RIGHT ATRIUM           Index LA diam:        4.20 cm 2.04 cm/m   RA Area:     18.40 cm LA Vol (A2C):   64.0 ml 31.06 ml/m  RA Volume:   58.20 ml  28.25 ml/m LA Vol (A4C):   51.3 ml 24.90 ml/m LA Biplane Vol: 57.4 ml 27.86 ml/m  AORTIC VALVE                    PULMONIC VALVE AV Area (Vmax):  2.26 cm     PV Vmax:       0.76 m/s AV Area (Vmean):   2.13 cm     PV Peak grad:  2.3 mmHg AV Area (VTI):     2.08 cm AV Vmax:           100.00 cm/s AV Vmean:          70.500 cm/s AV VTI:            0.143 m AV Peak Grad:      4.0 mmHg AV Mean Grad:      2.0 mmHg LVOT Vmax:         71.80 cm/s LVOT Vmean:        47.700 cm/s LVOT VTI:          0.095 m LVOT/AV VTI ratio: 0.66 AI PHT:            727 msec  AORTA Ao Root diam: 3.40 cm Ao Asc diam:  3.40 cm MITRAL VALVE                  TRICUSPID VALVE MV Area (PHT): 4.68 cm       TR Peak grad:   36.0 mmHg MV Decel Time: 162  msec       TR Vmax:        300.00 cm/s MR Peak grad:    66.4 mmHg MR Vmax:         407.50 cm/s  SHUNTS MR PISA:         1.57 cm     Systemic VTI:  0.09 m MR PISA Eff ROA: 10 mm       Systemic Diam: 2.00 cm MR PISA Radius:  0.50 cm MV E velocity: 97.90 cm/s MV A velocity: 54.50 cm/s MV E/A ratio:  1.80 Cherlynn Kaiser MD Electronically signed by Cherlynn Kaiser MD Signature Date/Time: 01/10/2022/5:55:16 PM    Final    VAS [place] LOWER EXTREMITY VENOUS (DVT)  Result Date: 01/10/2022  Lower Venous DVT Study Patient Name:  Phillip Rich  Date of Exam:   01/10/2022 Medical Rec #: [id number]         Accession #:    [id number] Date of Birth: May 03, 1951         Patient Gender: M Patient Age:   67 years Exam Location:  [facility] Procedure:      VAS [place] LOWER EXTREMITY VENOUS (DVT) Referring Phys: ERIC [place] --------------------------------------------------------------------------------  Indications: Edema, and pulmonary embolism.  Risk Factors: Confirmed PE. Anticoagulation: Eliquis. Limitations: Poor ultrasound/tissue interface. Comparison Study: No prior studies. Performing Technologist: Oliver Hum RVT  Examination Guidelines: A complete evaluation includes B-mode imaging, spectral Doppler, color Doppler, and power Doppler as needed of all accessible portions of each vessel. Bilateral testing is considered an integral part of a complete examination. Limited examinations for reoccurring indications may be performed as noted. The reflux portion of the exam is performed with the patient in reverse Trendelenburg.  +---------+---------------+---------+-----------+----------+--------------+ RIGHT    CompressibilityPhasicitySpontaneityPropertiesThrombus Aging +---------+---------------+---------+-----------+----------+--------------+ CFV      Full           Yes      Yes                                 +---------+---------------+---------+-----------+----------+--------------+ SFJ      Full                                                         +---------+---------------+---------+-----------+----------+--------------+  FV Prox  Full                                                        +---------+---------------+---------+-----------+----------+--------------+ FV Mid   Full                                                        +---------+---------------+---------+-----------+----------+--------------+ FV DistalFull                                                        +---------+---------------+---------+-----------+----------+--------------+ PFV      Full                                                        +---------+---------------+---------+-----------+----------+--------------+ POP      Full           Yes      Yes                                 +---------+---------------+---------+-----------+----------+--------------+ PTV      Full                                                        +---------+---------------+---------+-----------+----------+--------------+ PERO     Full                                                        +---------+---------------+---------+-----------+----------+--------------+   +---------+---------------+---------+-----------+----------+--------------+ LEFT     CompressibilityPhasicitySpontaneityPropertiesThrombus Aging +---------+---------------+---------+-----------+----------+--------------+ CFV      Full           Yes      Yes                                 +---------+---------------+---------+-----------+----------+--------------+ SFJ      Full                                                        +---------+---------------+---------+-----------+----------+--------------+ FV Prox  Full                                                        +---------+---------------+---------+-----------+----------+--------------+  FV Mid   Full                                                         +---------+---------------+---------+-----------+----------+--------------+ FV DistalFull           Yes      Yes                                 +---------+---------------+---------+-----------+----------+--------------+ PFV      Full                                                        +---------+---------------+---------+-----------+----------+--------------+ POP      Full           Yes      Yes                                 +---------+---------------+---------+-----------+----------+--------------+ PTV      Full                                                        +---------+---------------+---------+-----------+----------+--------------+ PERO     Full                                                        +---------+---------------+---------+-----------+----------+--------------+     Summary: RIGHT: - There is no evidence of deep vein thrombosis in the lower extremity. However, portions of this examination were limited- see technologist comments above.  - No cystic structure found in the popliteal fossa.  LEFT: - There is no evidence of deep vein thrombosis in the lower extremity. However, portions of this examination were limited- see technologist comments above.  - No cystic structure found in the popliteal fossa.  *See table(s) above for measurements and observations. Electronically signed by Servando Snare MD on 01/10/2022 at 3:11:29 PM.    Final    DG Chest 2 View  Result Date: 01/09/2022 CLINICAL DATA:  Short of breath.  Recent pulmonary embolism EXAM: CHEST - 2 VIEW COMPARISON:  Chest CT 01/05/2022, radiograph 01/05/2022 FINDINGS: Stable cardiac silhouette. Low lung volumes. There is bibasilar airspace opacities and small effusions similar comparison exam. There is interval increase in central venous congestion compared to prior. IMPRESSION: *Bibasilar opacities representing pneumonia or pulmonary edema. *Interval increase in central venous congestion. *Low lung  volumes and pleural effusions. Electronically Signed   By: Suzy Bouchard M.D.   On: 01/09/2022 13:43    Assessment and Plan:   Acute systolic congestive heart failure:  Net negative at lease 2.2 liters .  I suspect this is nonischemic but ischemia needs to be excluded.  He needs left heart catheterization.  I will avoid right heart  with his pulmonary embolism.  I think we should do this while he is hospitalized so we do not have to interrupt anticoagulation as an outpatient.  I will start him on heparin and hold his Eliquis.  He does not seem to be decompensated so I will go ahead and start low-dose beta-blocker while we diurese him.  I will hold off on starting Entresto but would like to start This admission pending his creatinine with diuresis.  If his creatinine goes up we will do hydralazine nitrates.  AKI:  Creat is up slightly.  As above follow closely  Acute pulmonary embolism: Will switch to heparin with DOAC restarted at discharge.  Hypertension:  This is being managed in the context of treating his CHF  Diabetes mellitus: Plan per primary team.  A1c 8.3.   For questions or updates, please contact [place] Please consult [email or web address] for contact info under Cardiology/STEMI.   Signed, Minus Breeding, MD  01/11/2022 7:49 AM

## 2022-01-11 NOTE — Progress Notes (Addendum)
ANTICOAGULATION CONSULT NOTE - Initial Consult  Pharmacy Consult for Heparin Indication: Acute PE (apixaban on hold)  Allergies  Allergen Reactions   Codeine Nausea And Vomiting   Crestor [Rosuvastatin Calcium] Other (See Comments)    Leg aches   Erythromycin Rash    All Mycin drugs    Patient Measurements: Height: '5\' 9"'$  (175.3 cm) Weight: 90.1 kg (198 lb 11.2 oz) IBW/kg (Calculated) : 70.7 Heparin Dosing Weight: 89 kg  Vital Signs: Temp: 98.5 F (36.9 C) (09/02 0507) Temp Source: Oral (09/01 2238) BP: 119/77 (09/02 0852) Pulse Rate: 101 (09/02 0852)  Labs: Recent Labs    01/09/22 1400 01/09/22 1457 01/10/22 0526 01/11/22 0456  HGB 11.1*  --  9.9*  --   HCT 35.2*  --  30.8*  --   PLT 366  --  279  --   CREATININE 1.30*  --  1.28* 1.37*  TROPONINIHS 100* 99*  --   --     Estimated Creatinine Clearance: 54.9 mL/min (A) (by C-G formula based on SCr of 1.37 mg/dL (H)).   Medical History: Past Medical History:  Diagnosis Date   Anxiety    BPH (benign prostatic hyperplasia)    Complication of anesthesia    Diabetes mellitus    DJD (degenerative joint disease)    GERD (gastroesophageal reflux disease)    HTN (hypertension)    Hyperlipidemia    ILD (interstitial lung disease) (HCC)    Left renal mass    Melanoma (HCC)    Mild sleep apnea    Sleep study pending   PONV (postoperative nausea and vomiting)     Medications:  Medications Prior to Admission  Medication Sig Dispense Refill Last Dose   albuterol (VENTOLIN HFA) 108 (90 Base) MCG/ACT inhaler Inhale 1-2 puffs into the lungs every 6 (six) hours as needed for wheezing or shortness of breath. 1 each 0 01/09/2022   amLODipine (NORVASC) 2.5 MG tablet Take 2.5 mg by mouth at bedtime.   01/08/2022   APIXABAN (ELIQUIS) VTE STARTER PACK ('10MG'$  AND '5MG'$ ) Take as directed on package: start with two-'5mg'$  tablets twice daily for 7 days. On day 8, switch to one-'5mg'$  tablet twice daily. 1 each 0 01/09/2022 at 0700    Cholecalciferol (VITAMIN D PO) Take 1 tablet by mouth every morning.   01/08/2022   fluticasone (FLONASE) 50 MCG/ACT nasal spray Place 1 spray into both nostrils daily as needed for allergies or rhinitis.   01/08/2022   furosemide (LASIX) 20 MG tablet Take 40 mg by mouth daily.   01/08/2022   insulin lispro (HUMALOG KWIKPEN) 100 UNIT/ML KwikPen Inject 12 Units into the skin 2 (two) times daily as needed (high blood sugar).   Past Week   irbesartan (AVAPRO) 150 MG tablet Take 150 mg by mouth daily.   01/08/2022   levocetirizine (XYZAL) 5 MG tablet Take 5 mg by mouth daily.   01/08/2022   metFORMIN (GLUCOPHAGE) 1000 MG tablet Take 1,000 mg by mouth 2 (two) times daily.    01/08/2022   omeprazole (PRILOSEC) 20 MG capsule Take 1 tablet by mouth every morning.    01/08/2022   oxymetazoline (AFRIN) 0.05 % nasal spray Place 1 spray into both nostrils 2 (two) times daily as needed for congestion.   01/08/2022   simvastatin (ZOCOR) 20 MG tablet Take 1 tablet by mouth every morning.    01/08/2022   vitamin B-12 (CYANOCOBALAMIN) 100 MCG tablet Take 100 mcg by mouth daily.   01/08/2022   zolpidem (AMBIEN) 10  MG tablet Take 10 mg by mouth at bedtime.   01/08/2022   DULoxetine (CYMBALTA) 60 MG capsule Take 60 mg by mouth daily.   unknown   ipratropium (ATROVENT) 0.03 % nasal spray Place 2 sprays into the nose 2 (two) times daily. 30 mL 0    losartan (COZAAR) 25 MG tablet Take 1 tablet (25 mg total) by mouth daily. Please HOLD rx  Until patient calls for refill. (Patient not taking: Reported on 01/09/2022) 90 tablet 3 Completed Course   [EXPIRED] predniSONE (DELTASONE) 10 MG tablet Take 4 tablets (40 mg total) by mouth daily for 4 days. (Patient not taking: Reported on 01/09/2022) 16 tablet 0 Completed Course   Semaglutide,0.25 or 0.'5MG'$ /DOS, (OZEMPIC, 0.25 OR 0.5 MG/DOSE,) 2 MG/3ML SOPN Inject 0.5 mg into the skin once a week.   unknown   Scheduled:   amLODipine  2.5 mg Oral QHS   DULoxetine  60 mg Oral Daily   furosemide   40 mg Intravenous BID   insulin aspart  0-15 Units Subcutaneous TID WC   insulin aspart  0-5 Units Subcutaneous QHS   insulin aspart  6 Units Subcutaneous TID WC   pantoprazole  80 mg Oral Daily   simvastatin  20 mg Oral q morning   zolpidem  5 mg Oral QHS   Infusions:   Assessment: 25 yoM initially presented to ED on 8/27 with SOB and CT showed acute PE.  He refused admission and was discharged on Apixaban starter pack.  He re-presented to ED on 8/31 with ongoing SOB and was found to have Acute systolic congestive heart failure.  He was continued on his home apixaban, but is now being held for planned left heart cath.  Pharmacy is consulted to dose heparin while apixaban is held.   Apixaban 10 mg dose last given on 9/2 at [facility] pending.  Expect Heparin level will be artificially elevated d/t recent DOAC use.  Plan to use aPTT for heparin dosing until heparin levels are correlating with aPTT.  SCr elevated to 1.37 (baseline SCr 0.91) CBC: Hgb decreased to 9.9, Plt WNL No bleeding or complications reported.    Goal of Therapy:  Heparin level 0.3-0.7 units/ml aPTT 66-102 seconds Monitor platelets by anticoagulation protocol: Yes   Plan:  Start heparin tonight at 21:00 (12 hours after apixaban) without bolus Heparin IV infusion 1400 units/hr ([place]) APTT 8 hours after starting Daily aPTT, heparin level, and CBC Follow up heart cath timing and plans to transition back to apixaban post procedure.     Gretta Arab PharmD, BCPS Clinical Pharmacist [facility] main pharmacy [phone] 01/11/2022 10:14 AM

## 2022-01-11 NOTE — Progress Notes (Signed)
PROGRESS NOTE    BLAIR LUNDEEN  EAV:[id number] DOB: 02/08/1951 DOA: 01/09/2022 PCP: Lajean Manes, MD    Brief Narrative:   CALEM COCOZZA is a 71 y.o. male with past medical history significant for  type 2 diabetes mellitus, HTN, BPH, peripheral neuropathy, depression, history of left renal mass s/p partial nephrectomy, history of previous tobacco use disorder who presents to [facility] ED on 8/31 with progressive shortness of breath.  Patient was recently seen in the ED on 8/27 and diagnosed with acute pulmonary embolism and concern for vascular congestion from underlying CHF; but unfortunately patient left AMA but was prescribed Eliquis and Lasix on discharge from the ED.  Initially patient reports symptom onset roughly 1 week ago after recent travel to [place] in which he flew on an airplane and following return started experiencing some weight gain and lower extremity edema.  He has been having difficulty laying flat and finding a position of comfort.  He also reports difficulty ambulating even 20 feet without having stopping and resting.  He was seen by his PCP and directed to the ED for further evaluation.  He reports a roughly 11 pound weight gain over the last week.  He reports compliance with the majority of his medications but does admit to missing some of his insulin occasionally.  He does not recall ever having an echocardiogram, never seen a cardiologist in the past but was recently referred to a pulmonologist with also concerns about obstructive sleep apnea.  Patient denies headache, no dizziness, no chest pain, no fever/chills/night sweats, no nausea/vomiting/diarrhea, no abdominal pain, no focal weakness, no cough, no paresthesias.   In the ED, temperature 98.0 F, HR 109, RR 15, BP 133/87, SPO2 100% on room air.  WBC 11.7, hemoglobin 11.1, platelets 366.  Sodium 133, potassium 4.5, chloride 102, CO2 24, glucose 330, BUN 45, creatinine 1.30.  High sensitive troponin 100>99.  BNP 2249.9.   Chest x-ray with bibasilar opacities representing pneumonia or pulmonary edema with interval increase in central venous congestion.  EKG personally reviewed with sinus tachycardia, rate 109, T wave inversion in lead I/aVL and V6 which is similar in appearance to EKG dated 01/05/2022.  Patient was given an albuterol neb treatment, Lasix in the ED.  EDP consulted [facility] for further evaluation and management of CHF exacerbation that is undifferentiated and recent pulmonary embolism.  Assessment & Plan:   Acute systolic congestive heart failure exacerbation Patient presenting to ED with progressive shortness of breath, worse with exertion and reported 11 pound weight gain over the last 1 week.  No previous history of known CHF, has never seen a cardiologist in the past and no echocardiogram available for review in EMR.  Patient with elevated BNP of 2249, chest x-ray with findings of progressive vascular congestion since 4 days prior and physical exam findings of positive JVP and lower extremity edema.  TTE with LVEF 15-20%, LV severely decreased function with global hypokinesis, LV internal cavity mildly dilated, IVC normal. -- Cardiology following, appreciate assistance -- net negative 2.2L since admission -- Furosemide 40 mg IV every 12 hours -- Fluid restriction 1800 mL/day -- Strict I's and O's and daily weights -- Monitor BMP daily -- Cardiology plans left heart catheterization to rule out ischemic cause of heart failure   Acute pulmonary embolism Patient recently traveled via airplane to [place] 1 week ago.  Since has had increased shortness of breath, lower extremity edema and weight gain.  Diagnosed 4 days prior in the ED with acute  pulmonary embolism; unfortunate left AMA but was started on Eliquis.  Patient denies chest pain.  Vascular duplex ultrasound bilateral lower extremities negative for DVT. --Eliquis now on hold, on heparin drip for planned left heart catheterization per cardiology    Acute renal failure Creatinine elevated 1.30 on admission, was 0.91 on 01/05/2022.  Suspect etiology from volume overload. -- Cr 1.30>1.28>1.37 -- Hold home losartan -- Lasix as above -- Avoid nephrotoxins, renally dose all medications -- BMP daily   Elevated troponin High sensitive troponin elevated 100 followed by 99.  EKG with nonspecific T wave inversions lead I, aVL and V6 that were present previously on 8/27 without change.  Patient denies chest pain.  Etiology likely secondary to type II demand ischemia in the setting of volume overload as above. -- Monitor on telemetry   Leukocytosis WBC slightly elevated 11.7.  Patient denies cough, afebrile and chest x-ray findings likely more related to pulmonary edema. -- WBC 11.7>>10.5 and PCT <0.10 -- No indication for antibiotics; monitor clinically   Hyponatremia Etiology likely hypervolemic hyponatremia in the setting of volume overload.  Sodium 133 on admission.  Treatment as above with IV diuresis. -- Na 133>136 -- BMP daily   Essential hypertension -- Hold home ARB/ACE inhibitor for AKI -- Hydralazine 25 mg p.o. q8h PRN SBP >165 or DBP >110   Type 2 diabetes mellitus, with hyperglycemia Home regimen includes metformin 1000 mg p.o. twice daily, semaglutide 0.5 mg injected once weekly, Humalog 12 units twice daily.  Hemoglobin A1c 8.3, not optimally controlled. -- NovoLog 6 units TIDAC -- Moderate SSI for coverage -- CBG before every meal/at bedtime   Allergies --Claritin, Flonase   Weakness/debility/deconditioning: Patient lives alone, having increased difficulty ambulating.  Does not use any walking aid at baseline. --PT evaluation with no needs identified     DVT prophylaxis: Place TED hose Start: 01/11/22 1012    Code Status: Full Code Family Communication: No family present at bedside this morning  Disposition Plan:  Level of care: Telemetry Status is: Inpatient Remains inpatient appropriate because: Heparin  drip, cardiology plans left heart catheterization; anticipate discharge in 3-4 days once cardiology signed off    Consultants:  Cardiology, Dr. Percival Spanish  Procedures:  TTE:  Vascular duplex ultrasound:   Antimicrobials:  None   Subjective: Patient seen examined bedside, resting comfortably.  Sitting in bedside chair.  States breathing much improved today.  Less short of breath while ambulating.  Discussed concerns regarding his echocardiogram results given low EF and consulted cardiology.  Eliquis transition to heparin drip in anticipation of left heart catheterization.  No other specific questions or concerns at this time. No other questions or concerns at this time.  Denies headache, no dizziness, no chest pain, no palpitations, no abdominal pain, no fever/chills/night sweats, no nausea/vomiting/diarrhea, no cough/congestion, no fatigue, no paresthesias, no focal weakness.  Objective: Vitals:   01/10/22 2100 01/10/22 2238 01/11/22 0507 01/11/22 0852  BP: 132/81  114/71 119/77  Pulse:   97 (!) 101  Resp:   18   Temp:  98.6 F (37 C) 98.5 F (36.9 C)   TempSrc:  Oral    SpO2:  92% 97%   Weight:      Height:        Intake/Output Summary (Last 24 hours) at 01/11/2022 1108 Last data filed at 01/11/2022 0700 Gross per 24 hour  Intake 840 ml  Output 1725 ml  Net -885 ml   Filed Weights   01/10/22 0600  Weight: 90.1 kg  Examination:  Physical Exam: GEN: NAD, alert and oriented x 3, wd/wn HEENT: NCAT, PERRL, EOMI, sclera clear, MMM PULM: Breath sounds diminished bilateral bases with diffuse crackles, no wheezing, normal respiratory effort without accessory muscle use, on room air CV: RRR w/o M/G/R, + JVP GI: abd soft, NTND, NABS, no R/G/M MSK: 2+ pitting edema bilateral lower extremities up to knee, muscle strength globally intact 5/5 bilateral upper/lower extremities NEURO: CN II-XII intact, no focal deficits, sensation to light touch intact PSYCH: normal  mood/affect Integumentary: Chronic venous changes to bilateral lower extremities, multiple areas of ecchymosis to extremities in various stages of healing, otherwise no other concerning rashes/lesions/wounds to exposed skin regions.     Data Reviewed: I have personally reviewed following labs and imaging studies  CBC: Recent Labs  Lab 01/05/22 0901 01/09/22 1400 01/10/22 0526  WBC 10.2 11.7* 10.5  NEUTROABS  --  9.0*  --   HGB 10.3* 11.1* 9.9*  HCT 30.9* 35.2* 30.8*  MCV 89.8 94.9 93.3  PLT 293 366 010   Basic Metabolic Panel: Recent Labs  Lab 01/05/22 0901 01/09/22 1400 01/10/22 0526 01/11/22 0456  NA 134* 133* 136 136  K 4.2 4.5 3.9 4.2  CL 102 102 102 100  CO2 '22 24 23 29  '$ GLUCOSE 255* 330* 249* 159*  BUN 23 45* 45* 41*  CREATININE 0.91 1.30* 1.28* 1.37*  CALCIUM 8.9 8.9 8.8* 8.9   GFR: Estimated Creatinine Clearance: 54.9 mL/min (A) (by C-G formula based on SCr of 1.37 mg/dL (H)). Liver Function Tests: Recent Labs  Lab 01/10/22 0526  AST 75*  ALT 172*  ALKPHOS 109  BILITOT 1.0  PROT 6.5  ALBUMIN 2.9*   No results for input(s): "LIPASE", "AMYLASE" in the last 168 hours. No results for input(s): "AMMONIA" in the last 168 hours. Coagulation Profile: No results for input(s): "INR", "PROTIME" in the last 168 hours. Cardiac Enzymes: No results for input(s): "CKTOTAL", "CKMB", "CKMBINDEX", "TROPONINI" in the last 168 hours. BNP (last 3 results) No results for input(s): "PROBNP" in the last 8760 hours. HbA1C: Recent Labs    01/09/22 2139  HGBA1C 8.3*   CBG: Recent Labs  Lab 01/10/22 0808 01/10/22 1136 01/10/22 1705 01/10/22 2120 01/11/22 0817  GLUCAP 255* 73 233* 82 260*   Lipid Profile: Recent Labs    01/10/22 0526  CHOL 120  HDL 23*  LDLCALC 77  TRIG 101  CHOLHDL 5.2   Thyroid Function Tests: Recent Labs    01/09/22 2139  TSH 2.835   Anemia Panel: No results for input(s): "VITAMINB12", "FOLATE", "FERRITIN", "TIBC", "IRON",  "RETICCTPCT" in the last 72 hours. Sepsis Labs: Recent Labs  Lab 01/09/22 [place] <0.10    Recent Results (from the past 240 hour(s))  Resp Panel by RT-PCR (Flu A&B, Covid) Anterior Nasal Swab     Status: None   Collection Time: 01/05/22  1:24 PM   Specimen: Anterior Nasal Swab  Result Value Ref Range Status   SARS Coronavirus 2 by RT PCR NEGATIVE NEGATIVE Final    Comment: (NOTE) SARS-CoV-2 target nucleic acids are NOT DETECTED.  The SARS-CoV-2 RNA is generally detectable in upper respiratory specimens during the acute phase of infection. The lowest concentration of SARS-CoV-2 viral copies this assay can detect is 138 copies/mL. A negative result does not preclude SARS-Cov-2 infection and should not be used as the sole basis for treatment or other patient management decisions. A negative result may occur with  improper specimen collection/handling, submission of specimen other than nasopharyngeal  swab, presence of viral mutation(s) within the areas targeted by this assay, and inadequate number of viral copies(<138 copies/mL). A negative result must be combined with clinical observations, patient history, and epidemiological information. The expected result is Negative.  Fact Sheet for Patients:  EntrepreneurPulse.com.au  Fact Sheet for Healthcare Providers:  IncredibleEmployment.be  This test is no t yet approved or cleared by the [place] FDA and  has been authorized for detection and/or diagnosis of SARS-CoV-2 by FDA under an Emergency Use Authorization (EUA). This EUA will remain  in effect (meaning this test can be used) for the duration of the COVID-19 declaration under Section 564(b)(1) of the Act, 21 U.S.C.section 360bbb-3(b)(1), unless the authorization is terminated  or revoked sooner.       Influenza A by PCR NEGATIVE NEGATIVE Final   Influenza B by PCR NEGATIVE NEGATIVE Final    Comment: (NOTE) The Xpert  Xpress SARS-CoV-2/FLU/RSV plus assay is intended as an aid in the diagnosis of influenza from Nasopharyngeal swab specimens and should not be used as a sole basis for treatment. Nasal washings and aspirates are unacceptable for Xpert Xpress SARS-CoV-2/FLU/RSV testing.  Fact Sheet for Patients: EntrepreneurPulse.com.au  Fact Sheet for Healthcare Providers: IncredibleEmployment.be  This test is not yet approved or cleared by the [place] FDA and has been authorized for detection and/or diagnosis of SARS-CoV-2 by FDA under an Emergency Use Authorization (EUA). This EUA will remain in effect (meaning this test can be used) for the duration of the COVID-19 declaration under Section 564(b)(1) of the Act, 21 U.S.C. section 360bbb-3(b)(1), unless the authorization is terminated or revoked.  Performed at [facility], [address], [place], [place] [address]          Radiology Studies: ECHOCARDIOGRAM COMPLETE  Result Date: 01/10/2022    ECHOCARDIOGRAM REPORT   Patient Name:   TOSHIRO HANKEN Date of Exam: 01/10/2022 Medical Rec #:  [id number]        Height:       69.0 in Accession #:    [id number]       Weight:       198.7 lb Date of Birth:  [birth year]        BSA:          2.060 m Patient Age:    22 years         BP:           120/113 mmHg Patient Gender: M                HR:           106 bpm. Exam Location:  Inpatient Procedure: 2D Echo and Intracardiac Opacification Agent Indications:    Dyspnea  History:        Patient has no prior history of Echocardiogram examinations.                 CHF, Signs/Symptoms:Shortness of Breath; Risk                 Factors:Hypertension and Diabetes.  Sonographer:    Harvie Junior Referring Phys: [id number] Matty Vanroekel J [place] IMPRESSIONS  1. Left ventricular ejection fraction, by estimation, is 15-20%. The left ventricle has severely decreased function. The left ventricle demonstrates global hypokinesis. The  left ventricular internal cavity size was mildly dilated. Left ventricular diastolic parameters are indeterminate.  2. Right ventricular systolic function is moderately reduced. The right ventricular size is mildly enlarged. There is mildly elevated pulmonary artery systolic pressure. The  estimated right ventricular systolic pressure is 51.7 mmHg.  3. The mitral valve is grossly normal. Moderate mitral valve regurgitation - jet of MR is likely eccentric, and may be underestimated. No evidence of mitral stenosis.  4. Tricuspid valve regurgitation is moderate.  5. The aortic valve is grossly normal. There is mild calcification of the aortic valve. Aortic valve regurgitation is not visualized. No aortic stenosis is present.  6. The inferior vena cava is normal in size with <50% respiratory variability, suggesting right atrial pressure of 8 mmHg. Conclusion(s)/Recommendation(s): No left ventricular mural or apical thrombus/thrombi. FINDINGS  Left Ventricle: Left ventricular ejection fraction, by estimation, is 15-20%. The left ventricle has severely decreased function. The left ventricle demonstrates global hypokinesis. The left ventricular internal cavity size was mildly dilated. There is no left ventricular hypertrophy. Left ventricular diastolic parameters are indeterminate. Right Ventricle: The right ventricular size is mildly enlarged. No increase in right ventricular wall thickness. Right ventricular systolic function is moderately reduced. There is mildly elevated pulmonary artery systolic pressure. The tricuspid regurgitant velocity is 3.00 m/s, and with an assumed right atrial pressure of 8 mmHg, the estimated right ventricular systolic pressure is 61.6 mmHg. Left Atrium: Left atrial size was normal in size. Right Atrium: Right atrial size was normal in size. Pericardium: There is no evidence of pericardial effusion. Mitral Valve: The mitral valve is grossly normal. Moderate mitral valve regurgitation. No  evidence of mitral valve stenosis. Tricuspid Valve: The tricuspid valve is normal in structure. Tricuspid valve regurgitation is moderate . No evidence of tricuspid stenosis. Aortic Valve: The aortic valve is grossly normal. There is mild calcification of the aortic valve. Aortic valve regurgitation is not visualized. Aortic regurgitation PHT measures 727 msec. No aortic stenosis is present. Aortic valve mean gradient measures 2.0 mmHg. Aortic valve peak gradient measures 4.0 mmHg. Aortic valve area, by VTI measures 2.08 cm. Pulmonic Valve: The pulmonic valve was normal in structure. Pulmonic valve regurgitation is trivial. No evidence of pulmonic stenosis. Aorta: The aortic root is normal in size and structure. Venous: The inferior vena cava is normal in size with less than 50% respiratory variability, suggesting right atrial pressure of 8 mmHg. IAS/Shunts: No atrial level shunt detected by color flow Doppler.  LEFT VENTRICLE PLAX 2D LVIDd:         5.60 cm      Diastology LVIDs:         4.60 cm      LV e' medial:    4.46 cm/s LV PW:         1.00 cm      LV E/e' medial:  22.0 LV IVS:        1.00 cm      LV e' lateral:   5.22 cm/s LVOT diam:     2.00 cm      LV E/e' lateral: 18.8 LV SV:         30 LV SV Index:   14 LVOT Area:     3.14 cm  LV Volumes (MOD) LV vol d, MOD A2C: 248.0 ml LV vol d, MOD A4C: 198.0 ml LV vol s, MOD A2C: 212.0 ml LV vol s, MOD A4C: 167.0 ml LV SV MOD A2C:     36.0 ml LV SV MOD A4C:     198.0 ml LV SV MOD BP:      31.5 ml RIGHT VENTRICLE RV Basal diam:  4.40 cm RV Mid diam:    4.00 cm RV FAC:  22.8 % LEFT ATRIUM             Index        RIGHT ATRIUM           Index LA diam:        4.20 cm 2.04 cm/m   RA Area:     18.40 cm LA Vol (A2C):   64.0 ml 31.06 ml/m  RA Volume:   58.20 ml  28.25 ml/m LA Vol (A4C):   51.3 ml 24.90 ml/m LA Biplane Vol: 57.4 ml 27.86 ml/m  AORTIC VALVE                    PULMONIC VALVE AV Area (Vmax):    2.26 cm     PV Vmax:       0.76 m/s AV Area (Vmean):    2.13 cm     PV Peak grad:  2.3 mmHg AV Area (VTI):     2.08 cm AV Vmax:           100.00 cm/s AV Vmean:          70.500 cm/s AV VTI:            0.143 m AV Peak Grad:      4.0 mmHg AV Mean Grad:      2.0 mmHg LVOT Vmax:         71.80 cm/s LVOT Vmean:        47.700 cm/s LVOT VTI:          0.095 m LVOT/AV VTI ratio: 0.66 AI PHT:            727 msec  AORTA Ao Root diam: 3.40 cm Ao Asc diam:  3.40 cm MITRAL VALVE                  TRICUSPID VALVE MV Area (PHT): 4.68 cm       TR Peak grad:   36.0 mmHg MV Decel Time: 162 msec       TR Vmax:        300.00 cm/s MR Peak grad:    66.4 mmHg MR Vmax:         407.50 cm/s  SHUNTS MR PISA:         1.57 cm     Systemic VTI:  0.09 m MR PISA Eff ROA: 10 mm       Systemic Diam: 2.00 cm MR PISA Radius:  0.50 cm MV E velocity: 97.90 cm/s MV A velocity: 54.50 cm/s MV E/A ratio:  1.80 Cherlynn Kaiser MD Electronically signed by Cherlynn Kaiser MD Signature Date/Time: 01/10/2022/5:55:16 PM    Final    VAS [place] LOWER EXTREMITY VENOUS (DVT)  Result Date: 01/10/2022  Lower Venous DVT Study Patient Name:  STACIE KNUTZEN  Date of Exam:   01/10/2022 Medical Rec #: [id number]         Accession #:    [id number] Date of Birth: [birth year]         Patient Gender: M Patient Age:   46 years Exam Location:  [facility] Procedure:      VAS [place] LOWER EXTREMITY VENOUS (DVT) Referring Phys: Everest Brod [place] --------------------------------------------------------------------------------  Indications: Edema, and pulmonary embolism.  Risk Factors: Confirmed PE. Anticoagulation: Eliquis. Limitations: Poor ultrasound/tissue interface. Comparison Study: No prior studies. Performing Technologist: Oliver Hum RVT  Examination Guidelines: A complete evaluation includes B-mode imaging, spectral Doppler, color Doppler, and power Doppler as needed of all accessible portions of each  vessel. Bilateral testing is considered an integral part of a complete examination. Limited examinations for reoccurring  indications may be performed as noted. The reflux portion of the exam is performed with the patient in reverse Trendelenburg.  +---------+---------------+---------+-----------+----------+--------------+ RIGHT    CompressibilityPhasicitySpontaneityPropertiesThrombus Aging +---------+---------------+---------+-----------+----------+--------------+ CFV      Full           Yes      Yes                                 +---------+---------------+---------+-----------+----------+--------------+ SFJ      Full                                                        +---------+---------------+---------+-----------+----------+--------------+ FV Prox  Full                                                        +---------+---------------+---------+-----------+----------+--------------+ FV Mid   Full                                                        +---------+---------------+---------+-----------+----------+--------------+ FV DistalFull                                                        +---------+---------------+---------+-----------+----------+--------------+ PFV      Full                                                        +---------+---------------+---------+-----------+----------+--------------+ POP      Full           Yes      Yes                                 +---------+---------------+---------+-----------+----------+--------------+ PTV      Full                                                        +---------+---------------+---------+-----------+----------+--------------+ PERO     Full                                                        +---------+---------------+---------+-----------+----------+--------------+   +---------+---------------+---------+-----------+----------+--------------+ LEFT     CompressibilityPhasicitySpontaneityPropertiesThrombus Aging  +---------+---------------+---------+-----------+----------+--------------+  CFV      Full           Yes      Yes                                 +---------+---------------+---------+-----------+----------+--------------+ SFJ      Full                                                        +---------+---------------+---------+-----------+----------+--------------+ FV Prox  Full                                                        +---------+---------------+---------+-----------+----------+--------------+ FV Mid   Full                                                        +---------+---------------+---------+-----------+----------+--------------+ FV DistalFull           Yes      Yes                                 +---------+---------------+---------+-----------+----------+--------------+ PFV      Full                                                        +---------+---------------+---------+-----------+----------+--------------+ POP      Full           Yes      Yes                                 +---------+---------------+---------+-----------+----------+--------------+ PTV      Full                                                        +---------+---------------+---------+-----------+----------+--------------+ PERO     Full                                                        +---------+---------------+---------+-----------+----------+--------------+     Summary: RIGHT: - There is no evidence of deep vein thrombosis in the lower extremity. However, portions of this examination were limited- see technologist comments above.  - No cystic structure found in the popliteal fossa.  LEFT: - There is no evidence of deep vein thrombosis in the lower extremity. However, portions of this examination were limited-  see technologist comments above.  - No cystic structure found in the popliteal fossa.  *See table(s) above for measurements and observations.  Electronically signed by Servando Snare MD on 01/10/2022 at 3:11:29 PM.    Final    DG Chest 2 View  Result Date: 01/09/2022 CLINICAL DATA:  Short of breath.  Recent pulmonary embolism EXAM: CHEST - 2 VIEW COMPARISON:  Chest CT 01/05/2022, radiograph 01/05/2022 FINDINGS: Stable cardiac silhouette. Low lung volumes. There is bibasilar airspace opacities and small effusions similar comparison exam. There is interval increase in central venous congestion compared to prior. IMPRESSION: *Bibasilar opacities representing pneumonia or pulmonary edema. *Interval increase in central venous congestion. *Low lung volumes and pleural effusions. Electronically Signed   By: Suzy Bouchard M.D.   On: 01/09/2022 13:43        Scheduled Meds:  amLODipine  2.5 mg Oral QHS   DULoxetine  60 mg Oral Daily   furosemide  40 mg Intravenous BID   insulin aspart  0-15 Units Subcutaneous TID WC   insulin aspart  0-5 Units Subcutaneous QHS   insulin aspart  6 Units Subcutaneous TID WC   pantoprazole  80 mg Oral Daily   simvastatin  20 mg Oral q morning   zolpidem  5 mg Oral QHS   Continuous Infusions:  heparin       LOS: 2 days    Time spent: 51 minutes spent on chart review, discussion with nursing staff, consultants, updating family and interview/physical exam; more than 50% of that time was spent in counseling and/or coordination of care.    Nikkia Devoss J [place] DO [facility] Available via Epic secure chat 7am-7pm After these hours, please refer to coverage provider listed on [email or web address] 01/11/2022, 11:08 AM

## 2022-01-12 DIAGNOSIS — I5021 Acute systolic (congestive) heart failure: Secondary | ICD-10-CM

## 2022-01-12 DIAGNOSIS — I10 Essential (primary) hypertension: Secondary | ICD-10-CM | POA: Diagnosis not present

## 2022-01-12 DIAGNOSIS — N179 Acute kidney failure, unspecified: Secondary | ICD-10-CM | POA: Diagnosis not present

## 2022-01-12 DIAGNOSIS — I2609 Other pulmonary embolism with acute cor pulmonale: Secondary | ICD-10-CM | POA: Diagnosis not present

## 2022-01-12 LAB — BASIC METABOLIC PANEL
Anion gap: 10 (ref 5–15)
BUN: 41 mg/dL — ABNORMAL HIGH (ref 8–23)
CO2: 26 mmol/L (ref 22–32)
Calcium: 8.9 mg/dL (ref 8.9–10.3)
Chloride: 96 mmol/L — ABNORMAL LOW (ref 98–111)
Creatinine, Ser: 1.33 mg/dL — ABNORMAL HIGH (ref 0.61–1.24)
GFR, Estimated: 57 mL/min — ABNORMAL LOW (ref >60)
Glucose, Bld: 251 mg/dL — ABNORMAL HIGH (ref 70–99)
Potassium: 3.8 mmol/L (ref 3.5–5.1)
Sodium: 132 mmol/L — ABNORMAL LOW (ref 135–145)

## 2022-01-12 LAB — GLUCOSE, CAPILLARY
Glucose-Capillary: 263 mg/dL — ABNORMAL HIGH (ref 70–99)
Glucose-Capillary: 214 mg/dL — ABNORMAL HIGH (ref 70–99)
Glucose-Capillary: 182 mg/dL — ABNORMAL HIGH (ref 70–99)
Glucose-Capillary: 160 mg/dL — ABNORMAL HIGH (ref 70–99)

## 2022-01-12 LAB — CBC
HCT: 34.2 % — ABNORMAL LOW (ref 39.0–52.0)
Hemoglobin: 10.8 g/dL — ABNORMAL LOW (ref 13.0–17.0)
MCH: 30.1 pg (ref 26.0–34.0)
MCHC: 31.6 g/dL (ref 30.0–36.0)
MCV: 95.3 fL (ref 80.0–100.0)
Platelets: 304 10*3/uL (ref 150–400)
RBC: 3.59 MIL/uL — ABNORMAL LOW (ref 4.22–5.81)
RDW: 15.7 % — ABNORMAL HIGH (ref 11.5–15.5)
WBC: 11.9 10*3/uL — ABNORMAL HIGH (ref 4.0–10.5)
nRBC: 0 % (ref 0.0–0.2)

## 2022-01-12 LAB — APTT
aPTT: >200 seconds (ref 24–36)
aPTT: 58 seconds — ABNORMAL HIGH (ref 24–36)

## 2022-01-12 MED ORDER — HEPARIN (PORCINE) 25000 UT/250ML-% IV SOLN
1150.0000 [IU]/h | INTRAVENOUS | Status: DC
  Administered 2022-01-12: 1150 [IU]/h via INTRAVENOUS

## 2022-01-12 MED ORDER — INSULIN GLARGINE-YFGN 100 UNIT/ML ~~LOC~~ SOLN
10.0000 [IU] | Freq: Every day | SUBCUTANEOUS | Status: DC
  Administered 2022-01-12 – 2022-01-21 (×9): 10 [IU] via SUBCUTANEOUS
  Filled 2022-01-12 (×10): qty 0.1

## 2022-01-12 MED ORDER — HYDRALAZINE HCL 10 MG PO TABS
10.0000 mg | ORAL_TABLET | Freq: Three times a day (TID) | ORAL | Status: DC
  Administered 2022-01-12 – 2022-01-15 (×8): 10 mg via ORAL
  Filled 2022-01-12 (×8): qty 1

## 2022-01-12 MED ORDER — ISOSORBIDE MONONITRATE ER 30 MG PO TB24
30.0000 mg | ORAL_TABLET | Freq: Every day | ORAL | Status: DC
  Administered 2022-01-12 – 2022-01-14 (×3): 30 mg via ORAL
  Filled 2022-01-12 (×3): qty 1

## 2022-01-12 MED ORDER — HEPARIN (PORCINE) 25000 UT/250ML-% IV SOLN
1300.0000 [IU]/h | INTRAVENOUS | Status: DC
Start: 2022-01-12 — End: 2022-01-13
  Administered 2022-01-12: 1300 [IU]/h via INTRAVENOUS
  Filled 2022-01-12: qty 250

## 2022-01-12 MED ORDER — FUROSEMIDE 10 MG/ML IJ SOLN
80.0000 mg | Freq: Two times a day (BID) | INTRAMUSCULAR | Status: DC
  Administered 2022-01-12 – 2022-01-13 (×3): 80 mg via INTRAVENOUS
  Filled 2022-01-12 (×4): qty 8

## 2022-01-12 NOTE — Progress Notes (Signed)
ANTICOAGULATION CONSULT NOTE - Follow Up Consult  Pharmacy Consult for Heparin Indication: pulmonary embolus  Allergies  Allergen Reactions   Codeine Nausea And Vomiting   Crestor [Rosuvastatin Calcium] Other (See Comments)    Leg aches   Erythromycin Rash    All Mycin drugs    Patient Measurements: Height: '5\' 9"'$  (175.3 cm) Weight: 90.1 kg (198 lb 11.2 oz) IBW/kg (Calculated) : 70.7 Heparin Dosing Weight:  88.9 kg  Vital Signs: Temp: 97.6 F (36.4 C) (09/03 1405) Temp Source: Oral (09/03 1405) BP: 120/75 (09/03 1405) Pulse Rate: 102 (09/03 1405)  Labs: Recent Labs    01/10/22 0526 01/11/22 0456 01/11/22 1938 01/12/22 0534 01/12/22 1418  HGB 9.9*  --   --  10.8*  --   HCT 30.8*  --   --  34.2*  --   PLT 279  --   --  304  --   APTT  --   --  47* >200* 58*  HEPARINUNFRC  --   --  >1.10*  --   --   CREATININE 1.28* 1.37*  --  1.33*  --      Estimated Creatinine Clearance: 56.6 mL/min (A) (by C-G formula based on SCr of 1.33 mg/dL (H)).   Assessment: AC/Heme: Acute PE diagnosed 8/27 from long trip, negative DVT - continues on apixaban '10mg'$  BID thru 9/2 then '5mg'$  BID (LD 9/2 AM). IV heparin to start 9/2 PM.  aPTT level now slightly SUBtherapeutic on current IV Heparin rate of 1150 units/hr Per RN,  no issues or bleeding noted   Goal of Therapy:  aPTT 66-102 seconds Monitor platelets by anticoagulation protocol: Yes   Plan:  Increase IV heparin from current rate 1150 units/hr to 1300 units/hr Recheck aPTT 6-8 hrs after heparin rate increased Daily HL and aPTT and CBC Await plan as to when heart cath at [facility] will be scheduled   Adrian Saran, PharmD, BCPS Secure Chat if ?s 01/12/2022 3:54 PM

## 2022-01-12 NOTE — Progress Notes (Signed)
Progress Note  Patient Name: Phillip Rich Date of Encounter: 01/12/2022  Primary Cardiologist:   Minus Breeding, MD   Subjective   He says that he feels better but he has had to sleep with his head propped up because of cough  Inpatient Medications    Scheduled Meds:  amLODipine  2.5 mg Oral QHS   DULoxetine  60 mg Oral Daily   furosemide  40 mg Intravenous BID   insulin aspart  0-15 Units Subcutaneous TID WC   insulin aspart  0-5 Units Subcutaneous QHS   insulin aspart  6 Units Subcutaneous TID WC   insulin glargine-yfgn  10 Units Subcutaneous Daily   pantoprazole  80 mg Oral Daily   simvastatin  20 mg Oral q morning   zolpidem  5 mg Oral QHS   Continuous Infusions:  heparin     PRN Meds: acetaminophen **OR** acetaminophen, albuterol, fluticasone, guaiFENesin-dextromethorphan, hydrALAZINE, ondansetron **OR** ondansetron (ZOFRAN) IV, polyethylene glycol   Vital Signs    Vitals:   01/11/22 2117 01/11/22 2117 01/11/22 2252 01/12/22 0619  BP: 126/81 126/81  124/87  Pulse:  (!) 102 92 (!) 107  Resp:  '17 18 17  '$ Temp:  97.7 F (36.5 C)    TempSrc:  Oral    SpO2:  99% 97% 99%  Weight:      Height:        Intake/Output Summary (Last 24 hours) at 01/12/2022 3335 Last data filed at 01/12/2022 0600 Gross per 24 hour  Intake 962.84 ml  Output 1075 ml  Net -112.16 ml   Filed Weights   01/10/22 0600  Weight: 90.1 kg    Telemetry    NSR - Personally Reviewed  ECG    NA - Personally Reviewed  Physical Exam   GEN: No acute distress.   Neck:   Positive  JVD at 45 degrees Cardiac: RRR, no murmurs, rubs, or gallops.  Respiratory:      Bilateral crackles 1/3 of the way up.  GI: Soft, nontender, non-distended  MS:    Mild edema; No deformity. Neuro:  Nonfocal  Psych: Normal affect   Labs    Chemistry Recent Labs  Lab 01/10/22 0526 01/11/22 0456 01/12/22 0534  NA 136 136 132*  K 3.9 4.2 3.8  CL 102 100 96*  CO2 '23 29 26  '$ GLUCOSE 249* 159* 251*   BUN 45* 41* 41*  CREATININE 1.28* 1.37* 1.33*  CALCIUM 8.8* 8.9 8.9  PROT 6.5  --   --   ALBUMIN 2.9*  --   --   AST 75*  --   --   ALT 172*  --   --   ALKPHOS 109  --   --   BILITOT 1.0  --   --   GFRNONAA 60* 55* 57*  ANIONGAP '11 7 10     '$ Hematology Recent Labs  Lab 01/09/22 1400 01/10/22 0526 01/12/22 0534  WBC 11.7* 10.5 11.9*  RBC 3.71* 3.30* 3.59*  HGB 11.1* 9.9* 10.8*  HCT 35.2* 30.8* 34.2*  MCV 94.9 93.3 95.3  MCH 29.9 30.0 30.1  MCHC 31.5 32.1 31.6  RDW 15.3 15.2 15.7*  PLT 366 279 304    Cardiac EnzymesNo results for input(s): "TROPONINI" in the last 168 hours. No results for input(s): "TROPIPOC" in the last 168 hours.   BNP Recent Labs  Lab 01/05/22 1211 01/09/22 1400 01/10/22 0526  BNP 1,345.0* 2,249.9* 1,972.3*     DDimer  Recent Labs  Lab 01/05/22  7096  DDIMER 1.30*     Radiology    ECHOCARDIOGRAM COMPLETE  Result Date: 01/10/2022    ECHOCARDIOGRAM REPORT   Patient Name:   Phillip Rich Date of Exam: 01/10/2022 Medical Rec #:  [id number]        Height:       69.0 in Accession #:    [id number]       Weight:       198.7 lb Date of Birth:  03/31/1951        BSA:          2.060 m Patient Age:    70 years         BP:           120/113 mmHg Patient Gender: M                HR:           106 bpm. Exam Location:  Inpatient Procedure: 2D Echo and Intracardiac Opacification Agent Indications:    Dyspnea  History:        Patient has no prior history of Echocardiogram examinations.                 CHF, Signs/Symptoms:Shortness of Breath; Risk                 Factors:Hypertension and Diabetes.  Sonographer:    Harvie Junior Referring Phys: [id number] ERIC J [place] IMPRESSIONS  1. Left ventricular ejection fraction, by estimation, is 15-20%. The left ventricle has severely decreased function. The left ventricle demonstrates global hypokinesis. The left ventricular internal cavity size was mildly dilated. Left ventricular diastolic parameters are indeterminate.  2.  Right ventricular systolic function is moderately reduced. The right ventricular size is mildly enlarged. There is mildly elevated pulmonary artery systolic pressure. The estimated right ventricular systolic pressure is 17.0 mmHg.  3. The mitral valve is grossly normal. Moderate mitral valve regurgitation - jet of MR is likely eccentric, and may be underestimated. No evidence of mitral stenosis.  4. Tricuspid valve regurgitation is moderate.  5. The aortic valve is grossly normal. There is mild calcification of the aortic valve. Aortic valve regurgitation is not visualized. No aortic stenosis is present.  6. The inferior vena cava is normal in size with <50% respiratory variability, suggesting right atrial pressure of 8 mmHg. Conclusion(s)/Recommendation(s): No left ventricular mural or apical thrombus/thrombi. FINDINGS  Left Ventricle: Left ventricular ejection fraction, by estimation, is 15-20%. The left ventricle has severely decreased function. The left ventricle demonstrates global hypokinesis. The left ventricular internal cavity size was mildly dilated. There is no left ventricular hypertrophy. Left ventricular diastolic parameters are indeterminate. Right Ventricle: The right ventricular size is mildly enlarged. No increase in right ventricular wall thickness. Right ventricular systolic function is moderately reduced. There is mildly elevated pulmonary artery systolic pressure. The tricuspid regurgitant velocity is 3.00 m/s, and with an assumed right atrial pressure of 8 mmHg, the estimated right ventricular systolic pressure is 01.7 mmHg. Left Atrium: Left atrial size was normal in size. Right Atrium: Right atrial size was normal in size. Pericardium: There is no evidence of pericardial effusion. Mitral Valve: The mitral valve is grossly normal. Moderate mitral valve regurgitation. No evidence of mitral valve stenosis. Tricuspid Valve: The tricuspid valve is normal in structure. Tricuspid valve  regurgitation is moderate . No evidence of tricuspid stenosis. Aortic Valve: The aortic valve is grossly normal. There is mild calcification of the aortic valve. Aortic valve regurgitation is not  visualized. Aortic regurgitation PHT measures 727 msec. No aortic stenosis is present. Aortic valve mean gradient measures 2.0 mmHg. Aortic valve peak gradient measures 4.0 mmHg. Aortic valve area, by VTI measures 2.08 cm. Pulmonic Valve: The pulmonic valve was normal in structure. Pulmonic valve regurgitation is trivial. No evidence of pulmonic stenosis. Aorta: The aortic root is normal in size and structure. Venous: The inferior vena cava is normal in size with less than 50% respiratory variability, suggesting right atrial pressure of 8 mmHg. IAS/Shunts: No atrial level shunt detected by color flow Doppler.  LEFT VENTRICLE PLAX 2D LVIDd:         5.60 cm      Diastology LVIDs:         4.60 cm      LV e' medial:    4.46 cm/s LV PW:         1.00 cm      LV E/e' medial:  22.0 LV IVS:        1.00 cm      LV e' lateral:   5.22 cm/s LVOT diam:     2.00 cm      LV E/e' lateral: 18.8 LV SV:         30 LV SV Index:   14 LVOT Area:     3.14 cm  LV Volumes (MOD) LV vol d, MOD A2C: 248.0 ml LV vol d, MOD A4C: 198.0 ml LV vol s, MOD A2C: 212.0 ml LV vol s, MOD A4C: 167.0 ml LV SV MOD A2C:     36.0 ml LV SV MOD A4C:     198.0 ml LV SV MOD BP:      31.5 ml RIGHT VENTRICLE RV Basal diam:  4.40 cm RV Mid diam:    4.00 cm RV FAC:         22.8 % LEFT ATRIUM             Index        RIGHT ATRIUM           Index LA diam:        4.20 cm 2.04 cm/m   RA Area:     18.40 cm LA Vol (A2C):   64.0 ml 31.06 ml/m  RA Volume:   58.20 ml  28.25 ml/m LA Vol (A4C):   51.3 ml 24.90 ml/m LA Biplane Vol: 57.4 ml 27.86 ml/m  AORTIC VALVE                    PULMONIC VALVE AV Area (Vmax):    2.26 cm     PV Vmax:       0.76 m/s AV Area (Vmean):   2.13 cm     PV Peak grad:  2.3 mmHg AV Area (VTI):     2.08 cm AV Vmax:           100.00 cm/s AV Vmean:           70.500 cm/s AV VTI:            0.143 m AV Peak Grad:      4.0 mmHg AV Mean Grad:      2.0 mmHg LVOT Vmax:         71.80 cm/s LVOT Vmean:        47.700 cm/s LVOT VTI:          0.095 m LVOT/AV VTI ratio: 0.66 AI PHT:  727 msec  AORTA Ao Root diam: 3.40 cm Ao Asc diam:  3.40 cm MITRAL VALVE                  TRICUSPID VALVE MV Area (PHT): 4.68 cm       TR Peak grad:   36.0 mmHg MV Decel Time: 162 msec       TR Vmax:        300.00 cm/s MR Peak grad:    66.4 mmHg MR Vmax:         407.50 cm/s  SHUNTS MR PISA:         1.57 cm     Systemic VTI:  0.09 m MR PISA Eff ROA: 10 mm       Systemic Diam: 2.00 cm MR PISA Radius:  0.50 cm MV E velocity: 97.90 cm/s MV A velocity: 54.50 cm/s MV E/A ratio:  1.80 Cherlynn Kaiser MD Electronically signed by Cherlynn Kaiser MD Signature Date/Time: 01/10/2022/5:55:16 PM    Final    VAS [place] LOWER EXTREMITY VENOUS (DVT)  Result Date: 01/10/2022  Lower Venous DVT Study Patient Name:  Phillip Rich  Date of Exam:   01/10/2022 Medical Rec #: [id number]         Accession #:    [id number] Date of Birth: 20-Mar-1951         Patient Gender: M Patient Age:   18 years Exam Location:  [facility] Procedure:      VAS [place] LOWER EXTREMITY VENOUS (DVT) Referring Phys: ERIC [place] --------------------------------------------------------------------------------  Indications: Edema, and pulmonary embolism.  Risk Factors: Confirmed PE. Anticoagulation: Eliquis. Limitations: Poor ultrasound/tissue interface. Comparison Study: No prior studies. Performing Technologist: Oliver Hum RVT  Examination Guidelines: A complete evaluation includes B-mode imaging, spectral Doppler, color Doppler, and power Doppler as needed of all accessible portions of each vessel. Bilateral testing is considered an integral part of a complete examination. Limited examinations for reoccurring indications may be performed as noted. The reflux portion of the exam is performed with the patient in reverse  Trendelenburg.  +---------+---------------+---------+-----------+----------+--------------+ RIGHT    CompressibilityPhasicitySpontaneityPropertiesThrombus Aging +---------+---------------+---------+-----------+----------+--------------+ CFV      Full           Yes      Yes                                 +---------+---------------+---------+-----------+----------+--------------+ SFJ      Full                                                        +---------+---------------+---------+-----------+----------+--------------+ FV Prox  Full                                                        +---------+---------------+---------+-----------+----------+--------------+ FV Mid   Full                                                        +---------+---------------+---------+-----------+----------+--------------+  FV DistalFull                                                        +---------+---------------+---------+-----------+----------+--------------+ PFV      Full                                                        +---------+---------------+---------+-----------+----------+--------------+ POP      Full           Yes      Yes                                 +---------+---------------+---------+-----------+----------+--------------+ PTV      Full                                                        +---------+---------------+---------+-----------+----------+--------------+ PERO     Full                                                        +---------+---------------+---------+-----------+----------+--------------+   +---------+---------------+---------+-----------+----------+--------------+ LEFT     CompressibilityPhasicitySpontaneityPropertiesThrombus Aging +---------+---------------+---------+-----------+----------+--------------+ CFV      Full           Yes      Yes                                  +---------+---------------+---------+-----------+----------+--------------+ SFJ      Full                                                        +---------+---------------+---------+-----------+----------+--------------+ FV Prox  Full                                                        +---------+---------------+---------+-----------+----------+--------------+ FV Mid   Full                                                        +---------+---------------+---------+-----------+----------+--------------+ FV DistalFull           Yes      Yes                                 +---------+---------------+---------+-----------+----------+--------------+  PFV      Full                                                        +---------+---------------+---------+-----------+----------+--------------+ POP      Full           Yes      Yes                                 +---------+---------------+---------+-----------+----------+--------------+ PTV      Full                                                        +---------+---------------+---------+-----------+----------+--------------+ PERO     Full                                                        +---------+---------------+---------+-----------+----------+--------------+     Summary: RIGHT: - There is no evidence of deep vein thrombosis in the lower extremity. However, portions of this examination were limited- see technologist comments above.  - No cystic structure found in the popliteal fossa.  LEFT: - There is no evidence of deep vein thrombosis in the lower extremity. However, portions of this examination were limited- see technologist comments above.  - No cystic structure found in the popliteal fossa.  *See table(s) above for measurements and observations. Electronically signed by Servando Snare MD on 01/10/2022 at 3:11:29 PM.    Final     Cardiac Studies   Echo see above.   Patient Profile     71 y.o.  male with a hx of DM, HTN and sleep apnea who is being seen for the evaluation of dyspnea at the request of Dr. [place]  Found to have a newly diagnosed severe cardiomyopathy.      Assessment & Plan    Acute systolic congestive heart failure:  Net negative at lease 2.3 liters .  Holding ARB/ARNi/spiro until after cath.  Hold off on beta blocker as he seems slightly less compensated today. I will add a low dose of hydralazine nitrates.  Increase his Lasix.  However, there is a strong chance he might need inotropes if his creat increases.  At that point I would hold on the cath and transfer to [facility] for therapy and "tuning up" prior to cath.    AKI:  Creat is unchanged.  Follow post cath.    Acute pulmonary embolism: Heparin for now.  DOAC at discharge for provoked PE.    Hypertension:  This is being managed in the context of treating his CHF   Diabetes mellitus: Plan per primary team.  A1c 8.3. Will suggest SGLT2i or GLP1ra per primary team pending the final dosing of his insulin.     For questions or updates, please contact [place] Please consult [email or web address] for contact info under Cardiology/STEMI.   Signed, Minus Breeding, MD  01/12/2022, 8:12 AM

## 2022-01-12 NOTE — Progress Notes (Signed)
PROGRESS NOTE    NILES ESS  VHQ:[id number] DOB: Aug 11, 1950 DOA: 01/09/2022 PCP: Lajean Manes, MD    Brief Narrative:   Phillip Rich is a 71 y.o. male with past medical history significant for  type 2 diabetes mellitus, HTN, BPH, peripheral neuropathy, depression, history of left renal mass s/p partial nephrectomy, history of previous tobacco use disorder who presents to [facility] ED on 8/31 with progressive shortness of breath.  Patient was recently seen in the ED on 8/27 and diagnosed with acute pulmonary embolism and concern for vascular congestion from underlying CHF; but unfortunately patient left AMA but was prescribed Eliquis and Lasix on discharge from the ED.  Initially patient reports symptom onset roughly 1 week ago after recent travel to [place] in which he flew on an airplane and following return started experiencing some weight gain and lower extremity edema.  He has been having difficulty laying flat and finding a position of comfort.  He also reports difficulty ambulating even 20 feet without having stopping and resting.  He was seen by his PCP and directed to the ED for further evaluation.  He reports a roughly 11 pound weight gain over the last week.  He reports compliance with the majority of his medications but does admit to missing some of his insulin occasionally.  He does not recall ever having an echocardiogram, never seen a cardiologist in the past but was recently referred to a pulmonologist with also concerns about obstructive sleep apnea.  Patient denies headache, no dizziness, no chest pain, no fever/chills/night sweats, no nausea/vomiting/diarrhea, no abdominal pain, no focal weakness, no cough, no paresthesias.   In the ED, temperature 98.0 F, HR 109, RR 15, BP 133/87, SPO2 100% on room air.  WBC 11.7, hemoglobin 11.1, platelets 366.  Sodium 133, potassium 4.5, chloride 102, CO2 24, glucose 330, BUN 45, creatinine 1.30.  High sensitive troponin 100>99.  BNP 2249.9.   Chest x-ray with bibasilar opacities representing pneumonia or pulmonary edema with interval increase in central venous congestion.  EKG personally reviewed with sinus tachycardia, rate 109, T wave inversion in lead I/aVL and V6 which is similar in appearance to EKG dated 01/05/2022.  Patient was given an albuterol neb treatment, Lasix in the ED.  EDP consulted [facility] for further evaluation and management of CHF exacerbation that is undifferentiated and recent pulmonary embolism.  Assessment & Plan:   Acute systolic congestive heart failure exacerbation Patient presenting to ED with progressive shortness of breath, worse with exertion and reported 11 pound weight gain over the last 1 week.  No previous history of known CHF, has never seen a cardiologist in the past and no echocardiogram available for review in EMR.  Patient with elevated BNP of 2249, chest x-ray with findings of progressive vascular congestion since 4 days prior and physical exam findings of positive JVP and lower extremity edema.  TTE with LVEF 15-20%, LV severely decreased function with global hypokinesis, LV internal cavity mildly dilated, IVC normal. -- Cardiology following, appreciate assistance -- net negative 2.3L since admission -- Furosemide 80 mg IV q12h -- Hydralazine '10mg'$  PO q8h  -- Fluid restriction 1800 mL/day -- Strict I's and O's and daily weights -- Monitor BMP daily -- Cardiology plans left heart catheterization to rule out ischemic cause of heart failure, likely Tuesday   Acute pulmonary embolism Patient recently traveled via airplane to [place] 1 week ago.  Since has had increased shortness of breath, lower extremity edema and weight gain.  Diagnosed 4 days  prior in the ED with acute pulmonary embolism; unfortunate left AMA but was started on Eliquis.  Patient denies chest pain.  Vascular duplex ultrasound bilateral lower extremities negative for DVT. --Eliquis now on hold, on heparin drip for planned left heart  catheterization per cardiology   Acute renal failure Creatinine elevated 1.30 on admission, was 0.91 on 01/05/2022.  Suspect etiology from volume overload. -- Cr 1.30>1.28>1.37>1.33 -- Hold home losartan -- Lasix as above -- Avoid nephrotoxins, renally dose all medications -- BMP daily   Elevated troponin High sensitive troponin elevated 100 followed by 99.  EKG with nonspecific T wave inversions lead I, aVL and V6 that were present previously on 8/27 without change.  Patient denies chest pain.  Etiology likely secondary to type II demand ischemia in the setting of volume overload as above. -- Monitor on telemetry   Leukocytosis WBC slightly elevated 11.7.  Patient denies cough, afebrile and chest x-ray findings likely more related to pulmonary edema. -- WBC 11.7>>10.5 and PCT <0.10 -- No indication for antibiotics; monitor clinically   Hyponatremia Etiology likely hypervolemic hyponatremia in the setting of volume overload.  Sodium 133 on admission.  Treatment as above with IV diuresis. -- Na 485>462>703 -- BMP daily   Essential hypertension -- Hold home ARB/ACE inhibitor for AKI -- Hydralazine '10mg'$  PO q8h -- Hydralazine 25 mg p.o. q8h PRN SBP >165 or DBP >110   Type 2 diabetes mellitus, with hyperglycemia Home regimen includes metformin 1000 mg p.o. twice daily, semaglutide 0.5 mg injected once weekly, Humalog 12 units twice daily.  Hemoglobin A1c 8.3, not optimally controlled. -- Semglee 10u [place] daily -- NovoLog 6 units TIDAC -- Moderate SSI for coverage -- CBG before every meal/at bedtime   Allergies --Claritin, Flonase   Weakness/debility/deconditioning: Patient lives alone, having increased difficulty ambulating.  Does not use any walking aid at baseline. --PT evaluation with no needs identified     DVT prophylaxis: Place TED hose Start: 01/11/22 1012    Code Status: Full Code Family Communication: No family present at bedside this morning  Disposition Plan:   Level of care: Telemetry Status is: Inpatient Remains inpatient appropriate because: Heparin drip, cardiology plans left heart catheterization;     Consultants:  Cardiology, Dr. Percival Spanish  Procedures:  TTE:  Vascular duplex ultrasound:   Antimicrobials:  None   Subjective: Patient seen examined bedside, resting comfortably.  Lying in bed.  States his breathing is much improved but had to sit upright overnight with nonproductive cough occasionally.  Cardiology planning on heart catheterization early this week.  Increased Lasix today.  No other specific questions or concerns at this time. No other questions or concerns at this time.  Denies headache, no dizziness, no chest pain, no palpitations, no abdominal pain, no fever/chills/night sweats, no nausea/vomiting/diarrhea, no cough/congestion, no fatigue, no paresthesias, no focal weakness.  Objective: Vitals:   01/11/22 2117 01/11/22 2117 01/11/22 2252 01/12/22 0619  BP: 126/81 126/81  124/87  Pulse:  (!) 102 92 (!) 107  Resp:  '17 18 17  '$ Temp:  97.7 F (36.5 C)    TempSrc:  Oral    SpO2:  99% 97% 99%  Weight:      Height:        Intake/Output Summary (Last 24 hours) at 01/12/2022 1003 Last data filed at 01/12/2022 0600 Gross per 24 hour  Intake 962.84 ml  Output 1075 ml  Net -112.16 ml   Filed Weights   01/10/22 0600  Weight: 90.1 kg  Examination:  Physical Exam: GEN: NAD, alert and oriented x 3, wd/wn HEENT: NCAT, PERRL, EOMI, sclera clear, MMM PULM: Breath sounds diminished bilateral bases with diffuse crackles, no wheezing, normal respiratory effort without accessory muscle use, on room air CV: RRR w/o M/G/R, + JVD GI: abd soft, NTND, NABS, no R/G/M MSK: 2+ pitting edema bilateral lower extremities up to knee, muscle strength globally intact 5/5 bilateral upper/lower extremities NEURO: CN II-XII intact, no focal deficits, sensation to light touch intact PSYCH: normal mood/affect Integumentary: Chronic venous  changes to bilateral lower extremities, multiple areas of ecchymosis to extremities in various stages of healing, otherwise no other concerning rashes/lesions/wounds to exposed skin regions.     Data Reviewed: I have personally reviewed following labs and imaging studies  CBC: Recent Labs  Lab 01/09/22 1400 01/10/22 0526 01/12/22 0534  WBC 11.7* 10.5 11.9*  NEUTROABS 9.0*  --   --   HGB 11.1* 9.9* 10.8*  HCT 35.2* 30.8* 34.2*  MCV 94.9 93.3 95.3  PLT 366 279 527   Basic Metabolic Panel: Recent Labs  Lab 01/09/22 1400 01/10/22 0526 01/11/22 0456 01/12/22 0534  NA 133* 136 136 132*  K 4.5 3.9 4.2 3.8  CL 102 102 100 96*  CO2 '24 23 29 26  '$ GLUCOSE 330* 249* 159* 251*  BUN 45* 45* 41* 41*  CREATININE 1.30* 1.28* 1.37* 1.33*  CALCIUM 8.9 8.8* 8.9 8.9   GFR: Estimated Creatinine Clearance: 56.6 mL/min (A) (by C-G formula based on SCr of 1.33 mg/dL (H)). Liver Function Tests: Recent Labs  Lab 01/10/22 0526  AST 75*  ALT 172*  ALKPHOS 109  BILITOT 1.0  PROT 6.5  ALBUMIN 2.9*   No results for input(s): "LIPASE", "AMYLASE" in the last 168 hours. No results for input(s): "AMMONIA" in the last 168 hours. Coagulation Profile: No results for input(s): "INR", "PROTIME" in the last 168 hours. Cardiac Enzymes: No results for input(s): "CKTOTAL", "CKMB", "CKMBINDEX", "TROPONINI" in the last 168 hours. BNP (last 3 results) No results for input(s): "PROBNP" in the last 8760 hours. HbA1C: Recent Labs    01/09/22 2139  HGBA1C 8.3*   CBG: Recent Labs  Lab 01/11/22 0817 01/11/22 1129 01/11/22 1629 01/11/22 2114 01/12/22 0727  GLUCAP 260* 253* 132* 185* 263*   Lipid Profile: Recent Labs    01/10/22 0526  CHOL 120  HDL 23*  LDLCALC 77  TRIG 101  CHOLHDL 5.2   Thyroid Function Tests: Recent Labs    01/09/22 2139  TSH 2.835   Anemia Panel: No results for input(s): "VITAMINB12", "FOLATE", "FERRITIN", "TIBC", "IRON", "RETICCTPCT" in the last 72 hours. Sepsis  Labs: Recent Labs  Lab 01/09/22 [place] <0.10    Recent Results (from the past 240 hour(s))  Resp Panel by RT-PCR (Flu A&B, Covid) Anterior Nasal Swab     Status: None   Collection Time: 01/05/22  1:24 PM   Specimen: Anterior Nasal Swab  Result Value Ref Range Status   SARS Coronavirus 2 by RT PCR NEGATIVE NEGATIVE Final    Comment: (NOTE) SARS-CoV-2 target nucleic acids are NOT DETECTED.  The SARS-CoV-2 RNA is generally detectable in upper respiratory specimens during the acute phase of infection. The lowest concentration of SARS-CoV-2 viral copies this assay can detect is 138 copies/mL. A negative result does not preclude SARS-Cov-2 infection and should not be used as the sole basis for treatment or other patient management decisions. A negative result may occur with  improper specimen collection/handling, submission of specimen other than nasopharyngeal  swab, presence of viral mutation(s) within the areas targeted by this assay, and inadequate number of viral copies(<138 copies/mL). A negative result must be combined with clinical observations, patient history, and epidemiological information. The expected result is Negative.  Fact Sheet for Patients:  EntrepreneurPulse.com.au  Fact Sheet for Healthcare Providers:  IncredibleEmployment.be  This test is no t yet approved or cleared by the [place] FDA and  has been authorized for detection and/or diagnosis of SARS-CoV-2 by FDA under an Emergency Use Authorization (EUA). This EUA will remain  in effect (meaning this test can be used) for the duration of the COVID-19 declaration under Section 564(b)(1) of the Act, 21 U.S.C.section 360bbb-3(b)(1), unless the authorization is terminated  or revoked sooner.       Influenza A by PCR NEGATIVE NEGATIVE Final   Influenza B by PCR NEGATIVE NEGATIVE Final    Comment: (NOTE) The Xpert Xpress SARS-CoV-2/FLU/RSV plus assay is  intended as an aid in the diagnosis of influenza from Nasopharyngeal swab specimens and should not be used as a sole basis for treatment. Nasal washings and aspirates are unacceptable for Xpert Xpress SARS-CoV-2/FLU/RSV testing.  Fact Sheet for Patients: EntrepreneurPulse.com.au  Fact Sheet for Healthcare Providers: IncredibleEmployment.be  This test is not yet approved or cleared by the [place] FDA and has been authorized for detection and/or diagnosis of SARS-CoV-2 by FDA under an Emergency Use Authorization (EUA). This EUA will remain in effect (meaning this test can be used) for the duration of the COVID-19 declaration under Section 564(b)(1) of the Act, 21 U.S.C. section 360bbb-3(b)(1), unless the authorization is terminated or revoked.  Performed at [facility], [address] [place], [place] [address]          Radiology Studies: ECHOCARDIOGRAM COMPLETE  Result Date: 01/10/2022    ECHOCARDIOGRAM REPORT   Patient Name:   Phillip Rich Date of Exam: 01/10/2022 Medical Rec #:  [id number]        Height:       69.0 in Accession #:    [id number]       Weight:       198.7 lb Date of Birth:  [birth year]        BSA:          2.060 m Patient Age:    3 years         BP:           120/113 mmHg Patient Gender: M                HR:           106 bpm. Exam Location:  Inpatient Procedure: 2D Echo and Intracardiac Opacification Agent Indications:    Dyspnea  History:        Patient has no prior history of Echocardiogram examinations.                 CHF, Signs/Symptoms:Shortness of Breath; Risk                 Factors:Hypertension and Diabetes.  Sonographer:    Harvie Junior Referring Phys: [id number] Adiah Guereca J [place] IMPRESSIONS  1. Left ventricular ejection fraction, by estimation, is 15-20%. The left ventricle has severely decreased function. The left ventricle demonstrates global hypokinesis. The left ventricular internal cavity size  was mildly dilated. Left ventricular diastolic parameters are indeterminate.  2. Right ventricular systolic function is moderately reduced. The right ventricular size is mildly enlarged. There is mildly elevated pulmonary artery systolic pressure. The  estimated right ventricular systolic pressure is 16.6 mmHg.  3. The mitral valve is grossly normal. Moderate mitral valve regurgitation - jet of MR is likely eccentric, and may be underestimated. No evidence of mitral stenosis.  4. Tricuspid valve regurgitation is moderate.  5. The aortic valve is grossly normal. There is mild calcification of the aortic valve. Aortic valve regurgitation is not visualized. No aortic stenosis is present.  6. The inferior vena cava is normal in size with <50% respiratory variability, suggesting right atrial pressure of 8 mmHg. Conclusion(s)/Recommendation(s): No left ventricular mural or apical thrombus/thrombi. FINDINGS  Left Ventricle: Left ventricular ejection fraction, by estimation, is 15-20%. The left ventricle has severely decreased function. The left ventricle demonstrates global hypokinesis. The left ventricular internal cavity size was mildly dilated. There is no left ventricular hypertrophy. Left ventricular diastolic parameters are indeterminate. Right Ventricle: The right ventricular size is mildly enlarged. No increase in right ventricular wall thickness. Right ventricular systolic function is moderately reduced. There is mildly elevated pulmonary artery systolic pressure. The tricuspid regurgitant velocity is 3.00 m/s, and with an assumed right atrial pressure of 8 mmHg, the estimated right ventricular systolic pressure is 06.3 mmHg. Left Atrium: Left atrial size was normal in size. Right Atrium: Right atrial size was normal in size. Pericardium: There is no evidence of pericardial effusion. Mitral Valve: The mitral valve is grossly normal. Moderate mitral valve regurgitation. No evidence of mitral valve stenosis.  Tricuspid Valve: The tricuspid valve is normal in structure. Tricuspid valve regurgitation is moderate . No evidence of tricuspid stenosis. Aortic Valve: The aortic valve is grossly normal. There is mild calcification of the aortic valve. Aortic valve regurgitation is not visualized. Aortic regurgitation PHT measures 727 msec. No aortic stenosis is present. Aortic valve mean gradient measures 2.0 mmHg. Aortic valve peak gradient measures 4.0 mmHg. Aortic valve area, by VTI measures 2.08 cm. Pulmonic Valve: The pulmonic valve was normal in structure. Pulmonic valve regurgitation is trivial. No evidence of pulmonic stenosis. Aorta: The aortic root is normal in size and structure. Venous: The inferior vena cava is normal in size with less than 50% respiratory variability, suggesting right atrial pressure of 8 mmHg. IAS/Shunts: No atrial level shunt detected by color flow Doppler.  LEFT VENTRICLE PLAX 2D LVIDd:         5.60 cm      Diastology LVIDs:         4.60 cm      LV e' medial:    4.46 cm/s LV PW:         1.00 cm      LV E/e' medial:  22.0 LV IVS:        1.00 cm      LV e' lateral:   5.22 cm/s LVOT diam:     2.00 cm      LV E/e' lateral: 18.8 LV SV:         30 LV SV Index:   14 LVOT Area:     3.14 cm  LV Volumes (MOD) LV vol d, MOD A2C: 248.0 ml LV vol d, MOD A4C: 198.0 ml LV vol s, MOD A2C: 212.0 ml LV vol s, MOD A4C: 167.0 ml LV SV MOD A2C:     36.0 ml LV SV MOD A4C:     198.0 ml LV SV MOD BP:      31.5 ml RIGHT VENTRICLE RV Basal diam:  4.40 cm RV Mid diam:    4.00 cm RV FAC:  22.8 % LEFT ATRIUM             Index        RIGHT ATRIUM           Index LA diam:        4.20 cm 2.04 cm/m   RA Area:     18.40 cm LA Vol (A2C):   64.0 ml 31.06 ml/m  RA Volume:   58.20 ml  28.25 ml/m LA Vol (A4C):   51.3 ml 24.90 ml/m LA Biplane Vol: 57.4 ml 27.86 ml/m  AORTIC VALVE                    PULMONIC VALVE AV Area (Vmax):    2.26 cm     PV Vmax:       0.76 m/s AV Area (Vmean):   2.13 cm     PV Peak grad:  2.3  mmHg AV Area (VTI):     2.08 cm AV Vmax:           100.00 cm/s AV Vmean:          70.500 cm/s AV VTI:            0.143 m AV Peak Grad:      4.0 mmHg AV Mean Grad:      2.0 mmHg LVOT Vmax:         71.80 cm/s LVOT Vmean:        47.700 cm/s LVOT VTI:          0.095 m LVOT/AV VTI ratio: 0.66 AI PHT:            727 msec  AORTA Ao Root diam: 3.40 cm Ao Asc diam:  3.40 cm MITRAL VALVE                  TRICUSPID VALVE MV Area (PHT): 4.68 cm       TR Peak grad:   36.0 mmHg MV Decel Time: 162 msec       TR Vmax:        300.00 cm/s MR Peak grad:    66.4 mmHg MR Vmax:         407.50 cm/s  SHUNTS MR PISA:         1.57 cm     Systemic VTI:  0.09 m MR PISA Eff ROA: 10 mm       Systemic Diam: 2.00 cm MR PISA Radius:  0.50 cm MV E velocity: 97.90 cm/s MV A velocity: 54.50 cm/s MV E/A ratio:  1.80 Cherlynn Kaiser MD Electronically signed by Cherlynn Kaiser MD Signature Date/Time: 01/10/2022/5:55:16 PM    Final    VAS [place] LOWER EXTREMITY VENOUS (DVT)  Result Date: 01/10/2022  Lower Venous DVT Study Patient Name:  Phillip Rich  Date of Exam:   01/10/2022 Medical Rec #: [id number]         Accession #:    [id number] Date of Birth: 23-Oct-1950         Patient Gender: M Patient Age:   74 years Exam Location:  [facility] Procedure:      VAS [place] LOWER EXTREMITY VENOUS (DVT) Referring Phys: Mccayla Shimada [place] --------------------------------------------------------------------------------  Indications: Edema, and pulmonary embolism.  Risk Factors: Confirmed PE. Anticoagulation: Eliquis. Limitations: Poor ultrasound/tissue interface. Comparison Study: No prior studies. Performing Technologist: Oliver Hum RVT  Examination Guidelines: A complete evaluation includes B-mode imaging, spectral Doppler, color Doppler, and power Doppler as needed of all accessible portions of each  vessel. Bilateral testing is considered an integral part of a complete examination. Limited examinations for reoccurring indications may be performed as noted.  The reflux portion of the exam is performed with the patient in reverse Trendelenburg.  +---------+---------------+---------+-----------+----------+--------------+ RIGHT    CompressibilityPhasicitySpontaneityPropertiesThrombus Aging +---------+---------------+---------+-----------+----------+--------------+ CFV      Full           Yes      Yes                                 +---------+---------------+---------+-----------+----------+--------------+ SFJ      Full                                                        +---------+---------------+---------+-----------+----------+--------------+ FV Prox  Full                                                        +---------+---------------+---------+-----------+----------+--------------+ FV Mid   Full                                                        +---------+---------------+---------+-----------+----------+--------------+ FV DistalFull                                                        +---------+---------------+---------+-----------+----------+--------------+ PFV      Full                                                        +---------+---------------+---------+-----------+----------+--------------+ POP      Full           Yes      Yes                                 +---------+---------------+---------+-----------+----------+--------------+ PTV      Full                                                        +---------+---------------+---------+-----------+----------+--------------+ PERO     Full                                                        +---------+---------------+---------+-----------+----------+--------------+   +---------+---------------+---------+-----------+----------+--------------+ LEFT     CompressibilityPhasicitySpontaneityPropertiesThrombus Aging +---------+---------------+---------+-----------+----------+--------------+  CFV      Full           Yes       Yes                                 +---------+---------------+---------+-----------+----------+--------------+ SFJ      Full                                                        +---------+---------------+---------+-----------+----------+--------------+ FV Prox  Full                                                        +---------+---------------+---------+-----------+----------+--------------+ FV Mid   Full                                                        +---------+---------------+---------+-----------+----------+--------------+ FV DistalFull           Yes      Yes                                 +---------+---------------+---------+-----------+----------+--------------+ PFV      Full                                                        +---------+---------------+---------+-----------+----------+--------------+ POP      Full           Yes      Yes                                 +---------+---------------+---------+-----------+----------+--------------+ PTV      Full                                                        +---------+---------------+---------+-----------+----------+--------------+ PERO     Full                                                        +---------+---------------+---------+-----------+----------+--------------+     Summary: RIGHT: - There is no evidence of deep vein thrombosis in the lower extremity. However, portions of this examination were limited- see technologist comments above.  - No cystic structure found in the popliteal fossa.  LEFT: - There is no evidence of deep vein thrombosis in the lower extremity. However, portions of this examination were  limited- see technologist comments above.  - No cystic structure found in the popliteal fossa.  *See table(s) above for measurements and observations. Electronically signed by Servando Snare MD on 01/10/2022 at 3:11:29 PM.    Final         Scheduled Meds:   DULoxetine  60 mg Oral Daily   furosemide  80 mg Intravenous BID   hydrALAZINE  10 mg Oral Q8H   insulin aspart  0-15 Units Subcutaneous TID WC   insulin aspart  0-5 Units Subcutaneous QHS   insulin aspart  6 Units Subcutaneous TID WC   insulin glargine-yfgn  10 Units Subcutaneous Daily   isosorbide mononitrate  30 mg Oral Daily   pantoprazole  80 mg Oral Daily   simvastatin  20 mg Oral q morning   zolpidem  5 mg Oral QHS   Continuous Infusions:  heparin 1,150 Units/hr (01/12/22 0908)     LOS: 3 days    Time spent: 48 minutes spent on chart review, discussion with nursing staff, consultants, updating family and interview/physical exam; more than 50% of that time was spent in counseling and/or coordination of care.    Simpson Paulos J [place] DO [facility] Available via Epic secure chat 7am-7pm After these hours, please refer to coverage provider listed on [email or web address] 01/12/2022, 10:03 AM

## 2022-01-12 NOTE — Progress Notes (Signed)
ANTICOAGULATION CONSULT NOTE - Follow Up Consult  Pharmacy Consult for Heparin Indication: pulmonary embolus  Allergies  Allergen Reactions   Codeine Nausea And Vomiting   Crestor [Rosuvastatin Calcium] Other (See Comments)    Leg aches   Erythromycin Rash    All Mycin drugs    Patient Measurements: Height: '5\' 9"'$  (175.3 cm) Weight: 90.1 kg (198 lb 11.2 oz) IBW/kg (Calculated) : 70.7 Heparin Dosing Weight:  88.9 kg  Vital Signs: Temp: 97.7 F (36.5 C) (09/02 2117) Temp Source: Oral (09/02 2117) BP: 124/87 (09/03 0619) Pulse Rate: 107 (09/03 0619)  Labs: Recent Labs    01/09/22 1400 01/09/22 1457 01/10/22 0526 01/11/22 0456 01/11/22 1938 01/12/22 0534  HGB 11.1*  --  9.9*  --   --  10.8*  HCT 35.2*  --  30.8*  --   --  34.2*  PLT 366  --  279  --   --  304  APTT  --   --   --   --  47* >200*  HEPARINUNFRC  --   --   --   --  >1.10*  --   CREATININE 1.30*  --  1.28* 1.37*  --  1.33*  TROPONINIHS 100* 99*  --   --   --   --     Estimated Creatinine Clearance: 56.6 mL/min (A) (by C-G formula based on SCr of 1.33 mg/dL (H)).   Assessment: AC/Heme: Acute PE diagnosed 8/27 from long trip, negative DVT - continues on apixaban '10mg'$  BID thru 9/2 then '5mg'$  BID (LD 9/2 AM). IV heparin to start 9/2 PM.  - Hgb 10.8 relatively stable. Plts WNL, aPTT>200 with lab drawn from opposite arm (right) as IV infusing (left).  Goal of Therapy:  aPTT 66-102 seconds Monitor platelets by anticoagulation protocol: Yes   Plan:  - Hold IV heparin x 1 hour, then resume at 1150 units/hr (13 units/kg/hr) - Recheck aPTT 6-8 hrs after heparin is resumed - Daily HL and aPTT and CBC  Manasseh Pittsley S. Alford Highland, PharmD, BCPS Clinical Staff Pharmacist [email or web address]  Alford Highland, [employer] 01/12/2022,7:57 AM

## 2022-01-13 DIAGNOSIS — I2609 Other pulmonary embolism with acute cor pulmonale: Secondary | ICD-10-CM | POA: Diagnosis not present

## 2022-01-13 DIAGNOSIS — I10 Essential (primary) hypertension: Secondary | ICD-10-CM | POA: Diagnosis not present

## 2022-01-13 DIAGNOSIS — N179 Acute kidney failure, unspecified: Secondary | ICD-10-CM | POA: Diagnosis not present

## 2022-01-13 DIAGNOSIS — I5021 Acute systolic (congestive) heart failure: Secondary | ICD-10-CM | POA: Diagnosis not present

## 2022-01-13 LAB — GLUCOSE, CAPILLARY
Glucose-Capillary: 180 mg/dL — ABNORMAL HIGH (ref 70–99)
Glucose-Capillary: 226 mg/dL — ABNORMAL HIGH (ref 70–99)
Glucose-Capillary: 198 mg/dL — ABNORMAL HIGH (ref 70–99)
Glucose-Capillary: 139 mg/dL — ABNORMAL HIGH (ref 70–99)

## 2022-01-13 LAB — BASIC METABOLIC PANEL
Anion gap: 7 (ref 5–15)
BUN: 39 mg/dL — ABNORMAL HIGH (ref 8–23)
CO2: 28 mmol/L (ref 22–32)
Calcium: 8.8 mg/dL — ABNORMAL LOW (ref 8.9–10.3)
Chloride: 100 mmol/L (ref 98–111)
Creatinine, Ser: 1.19 mg/dL (ref 0.61–1.24)
GFR, Estimated: >60 mL/min (ref >60)
Glucose, Bld: 179 mg/dL — ABNORMAL HIGH (ref 70–99)
Potassium: 3.8 mmol/L (ref 3.5–5.1)
Sodium: 135 mmol/L (ref 135–145)

## 2022-01-13 LAB — CBC
HCT: 29.7 % — ABNORMAL LOW (ref 39.0–52.0)
Hemoglobin: 9.6 g/dL — ABNORMAL LOW (ref 13.0–17.0)
MCH: 30.5 pg (ref 26.0–34.0)
MCHC: 32.3 g/dL (ref 30.0–36.0)
MCV: 94.3 fL (ref 80.0–100.0)
Platelets: 244 10*3/uL (ref 150–400)
RBC: 3.15 MIL/uL — ABNORMAL LOW (ref 4.22–5.81)
RDW: 15.8 % — ABNORMAL HIGH (ref 11.5–15.5)
WBC: 13.1 10*3/uL — ABNORMAL HIGH (ref 4.0–10.5)
nRBC: 0 % (ref 0.0–0.2)

## 2022-01-13 LAB — MAGNESIUM: Magnesium: 1.6 mg/dL — ABNORMAL LOW (ref 1.7–2.4)

## 2022-01-13 LAB — APTT
aPTT: 65 seconds — ABNORMAL HIGH (ref 24–36)
aPTT: 58 seconds — ABNORMAL HIGH (ref 24–36)
aPTT: 113 seconds — ABNORMAL HIGH (ref 24–36)

## 2022-01-13 LAB — HEPARIN LEVEL (UNFRACTIONATED): Heparin Unfractionated: >1.1 IU/mL — ABNORMAL HIGH (ref 0.30–0.70)

## 2022-01-13 MED ORDER — HEPARIN (PORCINE) 25000 UT/250ML-% IV SOLN
1350.0000 [IU]/h | INTRAVENOUS | Status: DC
  Administered 2022-01-13: 1550 [IU]/h via INTRAVENOUS
  Administered 2022-01-14 (×2): 1350 [IU]/h via INTRAVENOUS
  Filled 2022-01-13 (×3): qty 250

## 2022-01-13 MED ORDER — MAGNESIUM SULFATE 2 GM/50ML IV SOLN
2.0000 g | Freq: Once | INTRAVENOUS | Status: AC
Start: 2022-01-13 — End: 2022-01-13
  Administered 2022-01-13: 2 g via INTRAVENOUS
  Filled 2022-01-13: qty 50

## 2022-01-13 MED ORDER — HEPARIN (PORCINE) 25000 UT/250ML-% IV SOLN: 1450.0000 [IU]/h | INTRAVENOUS | Status: DC

## 2022-01-13 NOTE — Progress Notes (Signed)
PROGRESS NOTE    CHANDLOR NOECKER  [name]:[id number] DOB: 03/01/1951 DOA: 01/09/2022 PCP: Lajean Manes, MD    Brief Narrative:   Phillip Rich is a 71 y.o. male with past medical history significant for  type 2 diabetes mellitus, HTN, BPH, peripheral neuropathy, depression, history of left renal mass s/p partial nephrectomy, history of previous tobacco use disorder who presents to [facility] ED on 8/31 with progressive shortness of breath.  Patient was recently seen in the ED on 8/27 and diagnosed with acute pulmonary embolism and concern for vascular congestion from underlying CHF; but unfortunately patient left AMA but was prescribed Eliquis and Lasix on discharge from the ED.  Initially patient reports symptom onset roughly 1 week ago after recent travel to [place] in which he flew on an airplane and following return started experiencing some weight gain and lower extremity edema.  He has been having difficulty laying flat and finding a position of comfort.  He also reports difficulty ambulating even 20 feet without having stopping and resting.  He was seen by his PCP and directed to the ED for further evaluation.  He reports a roughly 11 pound weight gain over the last week.  He reports compliance with the majority of his medications but does admit to missing some of his insulin occasionally.  He does not recall ever having an echocardiogram, never seen a cardiologist in the past but was recently referred to a pulmonologist with also concerns about obstructive sleep apnea.  Patient denies headache, no dizziness, no chest pain, no fever/chills/night sweats, no nausea/vomiting/diarrhea, no abdominal pain, no focal weakness, no cough, no paresthesias.   In the ED, temperature 98.0 F, HR 109, RR 15, BP 133/87, SPO2 100% on room air.  WBC 11.7, hemoglobin 11.1, platelets 366.  Sodium 133, potassium 4.5, chloride 102, CO2 24, glucose 330, BUN 45, creatinine 1.30.  High sensitive troponin 100>99.  BNP 2249.9.   Chest x-ray with bibasilar opacities representing pneumonia or pulmonary edema with interval increase in central venous congestion.  EKG personally reviewed with sinus tachycardia, rate 109, T wave inversion in lead I/aVL and V6 which is similar in appearance to EKG dated 01/05/2022.  Patient was given an albuterol neb treatment, Lasix in the ED.  EDP consulted [facility] for further evaluation and management of CHF exacerbation that is undifferentiated and recent pulmonary embolism.  Assessment & Plan:   Acute systolic congestive heart failure exacerbation Patient presenting to ED with progressive shortness of breath, worse with exertion and reported 11 pound weight gain over the last 1 week.  No previous history of known CHF, has never seen a cardiologist in the past and no echocardiogram available for review in EMR.  Patient with elevated BNP of 2249, chest x-ray with findings of progressive vascular congestion since 4 days prior and physical exam findings of positive JVP and lower extremity edema.  TTE with LVEF 15-20%, LV severely decreased function with global hypokinesis, LV internal cavity mildly dilated, IVC normal. -- Cardiology following, appreciate assistance -- net negative 3.2L since admission with several unmeasured urinary occurrences -- Furosemide 80 mg IV q12h -- Hydralazine '10mg'$  PO q8h  --Isosorbide mononitrate 30 mg p.o. daily -- Fluid restriction 1800 mL/day -- Strict I's and O's and daily weights -- Monitor BMP daily -- Cardiology plans left heart catheterization to rule out ischemic cause of heart failure, likely tomorrow, n.p.o. after midnight   Acute pulmonary embolism Patient recently traveled via airplane to [place] 1 week ago.  Since has had  increased shortness of breath, lower extremity edema and weight gain.  Diagnosed 4 days prior in the ED with acute pulmonary embolism; unfortunate left AMA but was started on Eliquis.  Patient denies chest pain.  Vascular duplex ultrasound  bilateral lower extremities negative for DVT. --Eliquis now on hold, on heparin drip for planned left heart catheterization per cardiology   Acute renal failure Creatinine elevated 1.30 on admission, was 0.91 on 01/05/2022.  Suspect etiology from volume overload. -- Cr 1.30>1.28>1.37>1.33>1.19 -- Hold home losartan -- Lasix as above -- Avoid nephrotoxins, renally dose all medications -- BMP daily   Elevated troponin High sensitive troponin elevated 100 followed by 99.  EKG with nonspecific T wave inversions lead I, aVL and V6 that were present previously on 8/27 without change.  Patient denies chest pain.  Etiology likely secondary to type II demand ischemia in the setting of volume overload as above. -- Monitor on telemetry   Leukocytosis WBC slightly elevated 11.7.  Patient denies cough, afebrile and chest x-ray findings likely more related to pulmonary edema. -- WBC 11.7>>10.5>13.1 and PCT <0.10 -- No indication for antibiotics; monitor clinically   Hyponatremia Etiology likely hypervolemic hyponatremia in the setting of volume overload.  Sodium 133 on admission.  Treatment as above with IV diuresis. -- Na 133>136>132>135 -- BMP daily   Essential hypertension -- Hold home ARB/ACE inhibitor for AKI -- Hydralazine '10mg'$  PO q8h -- Isosorbide mononitrate 30 mg p.o. daily -- Hydralazine 25 mg p.o. q8h PRN SBP >165 or DBP >110   Type 2 diabetes mellitus, with hyperglycemia Home regimen includes metformin 1000 mg p.o. twice daily, semaglutide 0.5 mg injected once weekly, Humalog 12 units twice daily.  Hemoglobin A1c 8.3, not optimally controlled. -- Semglee 10u Glidden daily -- NovoLog 6 units TIDAC -- Moderate SSI for coverage -- CBG before every meal/at bedtime   Allergies --Claritin, Flonase   Weakness/debility/deconditioning: Patient lives alone, having increased difficulty ambulating.  Does not use any walking aid at baseline. --PT evaluation with no needs identified     DVT  prophylaxis: Place TED hose Start: 01/11/22 1012    Code Status: Full Code Family Communication: No family present at bedside this morning  Disposition Plan:  Level of care: Telemetry Status is: Inpatient Remains inpatient appropriate because: Heparin drip, cardiology plans left heart catheterization tomorrow    Consultants:  Cardiology, Dr. Percival Spanish  Procedures:  TTE:  Vascular duplex ultrasound:   Antimicrobials:  None   Subjective: Patient seen examined bedside, resting comfortably.  Lying in bed.  Continues with improved breathing, now able to lie flat.  Seen by cardiology, plan left heart catheterization tomorrow. No other specific questions or concerns at this time. No other questions or concerns at this time.  Denies headache, no dizziness, no chest pain, no palpitations, no abdominal pain, no fever/chills/night sweats, no nausea/vomiting/diarrhea, no cough/congestion, no fatigue, no paresthesias, no focal weakness.  Objective: Vitals:   01/12/22 2102 01/13/22 0500 01/13/22 0532 01/13/22 0944  BP: (!) 131/91  129/82 127/79  Pulse: (!) 105  (!) 101 (!) 102  Resp: '18  18 16  '$ Temp: 97.6 F (36.4 C)  (!) 97.5 F (36.4 C) 97.7 F (36.5 C)  TempSrc: Oral  Oral Oral  SpO2: 98%  97% 94%  Weight:  88.6 kg    Height:        Intake/Output Summary (Last 24 hours) at 01/13/2022 1025 Last data filed at 01/13/2022 0948 Gross per 24 hour  Intake 489.04 ml  Output 1450 ml  Net -960.96 ml   Filed Weights   01/10/22 0600 01/13/22 0500  Weight: 90.1 kg 88.6 kg    Examination:  Physical Exam: GEN: NAD, alert and oriented x 3, wd/wn HEENT: NCAT, PERRL, EOMI, sclera clear, MMM PULM: Breath sounds diminished bilateral bases with crackles, no wheezing, normal respiratory effort without accessory muscle use, on room air CV: RRR w/o M/G/R, + JVD GI: abd soft, NTND, NABS, no R/G/M MSK: 2+ pitting edema bilateral lower extremities up to knee, muscle strength globally intact 5/5  bilateral upper/lower extremities NEURO: CN II-XII intact, no focal deficits, sensation to light touch intact PSYCH: normal mood/affect Integumentary: Chronic venous changes to bilateral lower extremities, multiple areas of ecchymosis to extremities in various stages of healing, otherwise no other concerning rashes/lesions/wounds to exposed skin regions.     Data Reviewed: I have personally reviewed following labs and imaging studies  CBC: Recent Labs  Lab 01/09/22 1400 01/10/22 0526 01/12/22 0534 01/13/22 0136  WBC 11.7* 10.5 11.9* 13.1*  NEUTROABS 9.0*  --   --   --   HGB 11.1* 9.9* 10.8* 9.6*  HCT 35.2* 30.8* 34.2* 29.7*  MCV 94.9 93.3 95.3 94.3  PLT 366 279 304 408   Basic Metabolic Panel: Recent Labs  Lab 01/09/22 1400 01/10/22 0526 01/11/22 0456 01/12/22 0534 01/13/22 0136  NA 133* 136 136 132* 135  K 4.5 3.9 4.2 3.8 3.8  CL 102 102 100 96* 100  CO2 '24 23 29 26 28  '$ GLUCOSE 330* 249* 159* 251* 179*  BUN 45* 45* 41* 41* 39*  CREATININE 1.30* 1.28* 1.37* 1.33* 1.19  CALCIUM 8.9 8.8* 8.9 8.9 8.8*  MG  --   --   --   --  1.6*   GFR: Estimated Creatinine Clearance: 62.7 mL/min (by C-G formula based on SCr of 1.19 mg/dL). Liver Function Tests: Recent Labs  Lab 01/10/22 0526  AST 75*  ALT 172*  ALKPHOS 109  BILITOT 1.0  PROT 6.5  ALBUMIN 2.9*   No results for input(s): "LIPASE", "AMYLASE" in the last 168 hours. No results for input(s): "AMMONIA" in the last 168 hours. Coagulation Profile: No results for input(s): "INR", "PROTIME" in the last 168 hours. Cardiac Enzymes: No results for input(s): "CKTOTAL", "CKMB", "CKMBINDEX", "TROPONINI" in the last 168 hours. BNP (last 3 results) No results for input(s): "PROBNP" in the last 8760 hours. HbA1C: No results for input(s): "HGBA1C" in the last 72 hours.  CBG: Recent Labs  Lab 01/12/22 0727 01/12/22 1148 01/12/22 1651 01/12/22 2058 01/13/22 0736  GLUCAP 263* 214* 182* 160* 180*   Lipid Profile: No  results for input(s): "CHOL", "HDL", "LDLCALC", "TRIG", "CHOLHDL", "LDLDIRECT" in the last 72 hours.  Thyroid Function Tests: No results for input(s): "TSH", "T4TOTAL", "FREET4", "T3FREE", "THYROIDAB" in the last 72 hours.  Anemia Panel: No results for input(s): "VITAMINB12", "FOLATE", "FERRITIN", "TIBC", "IRON", "RETICCTPCT" in the last 72 hours. Sepsis Labs: Recent Labs  Lab 01/09/22 [place] <0.10    Recent Results (from the past 240 hour(s))  Resp Panel by RT-PCR (Flu A&B, Covid) Anterior Nasal Swab     Status: None   Collection Time: 01/05/22  1:24 PM   Specimen: Anterior Nasal Swab  Result Value Ref Range Status   SARS Coronavirus 2 by RT PCR NEGATIVE NEGATIVE Final    Comment: (NOTE) SARS-CoV-2 target nucleic acids are NOT DETECTED.  The SARS-CoV-2 RNA is generally detectable in upper respiratory specimens during the acute phase of infection. The lowest concentration of SARS-CoV-2 viral  copies this assay can detect is 138 copies/mL. A negative result does not preclude SARS-Cov-2 infection and should not be used as the sole basis for treatment or other patient management decisions. A negative result may occur with  improper specimen collection/handling, submission of specimen other than nasopharyngeal swab, presence of viral mutation(s) within the areas targeted by this assay, and inadequate number of viral copies(<138 copies/mL). A negative result must be combined with clinical observations, patient history, and epidemiological information. The expected result is Negative.  Fact Sheet for Patients:  EntrepreneurPulse.com.au  Fact Sheet for Healthcare Providers:  IncredibleEmployment.be  This test is no t yet approved or cleared by the [place] FDA and  has been authorized for detection and/or diagnosis of SARS-CoV-2 by FDA under an Emergency Use Authorization (EUA). This EUA will remain  in effect (meaning this test  can be used) for the duration of the COVID-19 declaration under Section 564(b)(1) of the Act, 21 U.S.C.section 360bbb-3(b)(1), unless the authorization is terminated  or revoked sooner.       Influenza A by PCR NEGATIVE NEGATIVE Final   Influenza B by PCR NEGATIVE NEGATIVE Final    Comment: (NOTE) The Xpert Xpress SARS-CoV-2/FLU/RSV plus assay is intended as an aid in the diagnosis of influenza from Nasopharyngeal swab specimens and should not be used as a sole basis for treatment. Nasal washings and aspirates are unacceptable for Xpert Xpress SARS-CoV-2/FLU/RSV testing.  Fact Sheet for Patients: EntrepreneurPulse.com.au  Fact Sheet for Healthcare Providers: IncredibleEmployment.be  This test is not yet approved or cleared by the [place] FDA and has been authorized for detection and/or diagnosis of SARS-CoV-2 by FDA under an Emergency Use Authorization (EUA). This EUA will remain in effect (meaning this test can be used) for the duration of the COVID-19 declaration under Section 564(b)(1) of the Act, 21 U.S.C. section 360bbb-3(b)(1), unless the authorization is terminated or revoked.  Performed at [facility], [address] [place], [place] [address]          Radiology Studies: No results found.      Scheduled Meds:  DULoxetine  60 mg Oral Daily   furosemide  80 mg Intravenous BID   hydrALAZINE  10 mg Oral Q8H   insulin aspart  0-15 Units Subcutaneous TID WC   insulin aspart  0-5 Units Subcutaneous QHS   insulin aspart  6 Units Subcutaneous TID WC   insulin glargine-yfgn  10 Units Subcutaneous Daily   isosorbide mononitrate  30 mg Oral Daily   pantoprazole  80 mg Oral Daily   simvastatin  20 mg Oral q morning   zolpidem  5 mg Oral QHS   Continuous Infusions:  heparin 1,450 Units/hr (01/13/22 0412)     LOS: 4 days    Time spent: 48 minutes spent on chart review, discussion with nursing  staff, consultants, updating family and interview/physical exam; more than 50% of that time was spent in counseling and/or coordination of care.    Maille Halliwell J [place] DO [facility] Available via Epic secure chat 7am-7pm After these hours, please refer to coverage provider listed on [email or web address] 01/13/2022, 10:25 AM

## 2022-01-13 NOTE — Progress Notes (Signed)
ANTICOAGULATION CONSULT NOTE - Follow Up Consult  Pharmacy Consult for Heparin Indication: pulmonary embolus  Allergies  Allergen Reactions   Codeine Nausea And Vomiting   Crestor [Rosuvastatin Calcium] Other (See Comments)    Leg aches   Erythromycin Rash    All Mycin drugs    Patient Measurements: Height: '5\' 9"'$  (175.3 cm) Weight: 90.1 kg (198 lb 11.2 oz) IBW/kg (Calculated) : 70.7 Heparin Dosing Weight:  88.9 kg  Vital Signs: Temp: 97.6 F (36.4 C) (09/03 2102) Temp Source: Oral (09/03 2102) BP: 131/91 (09/03 2102) Pulse Rate: 105 (09/03 2102)  Labs: Recent Labs    01/10/22 0526 01/10/22 0526 01/11/22 0456 01/11/22 1938 01/12/22 0534 01/12/22 1418 01/13/22 0136  HGB 9.9*  --   --   --  10.8*  --  9.6*  HCT 30.8*  --   --   --  34.2*  --  29.7*  PLT 279  --   --   --  304  --  244  APTT  --    < >  --  47* >200* 58* 65*  HEPARINUNFRC  --   --   --  >1.10*  --   --  >1.10*  CREATININE 1.28*  --  1.37*  --  1.33*  --  1.19   < > = values in this interval not displayed.     Estimated Creatinine Clearance: 63.2 mL/min (by C-G formula based on SCr of 1.19 mg/dL).   Assessment: AC/Heme: Acute PE diagnosed 8/27 from long trip, negative DVT - continues on apixaban '10mg'$  BID thru 9/2 then '5mg'$  BID (LD 9/2 AM). IV heparin to start 9/2 PM.  01/13/22  aPTT = 65 sec slightly SUBtherapeutic on current IV Heparin rate of 1300 units/hr Heparin level remains falsely elevated at > 1.1 due to lingering effects of apixaban Hgb 9.6, Pltc wnl No issues or bleeding noted   Goal of Therapy:  aPTT 66-102 seconds Monitor platelets by anticoagulation protocol: Yes   Plan:  Increase IV heparin to 1450 units/hr Recheck aPTT 8 hrs after heparin rate increased Daily HL and aPTT and CBC Await plan as to when heart cath at [facility] will be scheduled   Leone Haven, PharmD 01/13/2022 3:07 AM

## 2022-01-13 NOTE — Progress Notes (Signed)
Mobility Specialist - Progress Note     01/13/22 1451  Mobility  Activity Ambulated with assistance in hallway  Range of Motion/Exercises Active  Level of Assistance Contact guard assist, steadying assist  Assistive Device  (IV Pole)  Distance Ambulated (ft) 600 ft  Activity Response Tolerated well  $Mobility charge 1 Mobility   Pt was found in recliner chair and agreeable to mobilize. Stated feeling as though his legs were unsteady while ambulating which makes him feel unbalanced. At EOS returned to recliner chair with all necessities in reach.  Ferd Hibbs Mobility Specialist

## 2022-01-13 NOTE — Progress Notes (Addendum)
ANTICOAGULATION CONSULT NOTE - Follow Up Consult  Pharmacy Consult for Heparin (PTA apixaban on hold) Indication: pulmonary embolus  Allergies  Allergen Reactions   Codeine Nausea And Vomiting   Crestor [Rosuvastatin Calcium] Other (See Comments)    Leg aches   Erythromycin Rash    All Mycin drugs    Patient Measurements: Height: '5\' 9"'$  (175.3 cm) Weight: 88.6 kg (195 lb 5.2 oz) IBW/kg (Calculated) : 70.7 Heparin Dosing Weight:  89 kg  Vital Signs: Temp: 97.7 F (36.5 C) (09/04 0944) Temp Source: Oral (09/04 0944) BP: 127/79 (09/04 0944) Pulse Rate: 102 (09/04 0944)  Labs: Recent Labs    01/11/22 0456 01/11/22 1938 01/11/22 1938 01/12/22 0534 01/12/22 1418 01/13/22 0136 01/13/22 1129  HGB  --   --   --  10.8*  --  9.6*  --   HCT  --   --   --  34.2*  --  29.7*  --   PLT  --   --   --  304  --  244  --   APTT  --  47*   < > >200* 58* 65* 58*  HEPARINUNFRC  --  >1.10*  --   --   --  >1.10*  --   CREATININE 1.37*  --   --  1.33*  --  1.19  --    < > = values in this interval not displayed.     Estimated Creatinine Clearance: 62.7 mL/min (by C-G formula based on SCr of 1.19 mg/dL).   Assessment: AC/Heme: Acute PE diagnosed 8/27 from long trip, negative DVT - continues on apixaban '10mg'$  BID thru 9/2 then '5mg'$  BID (LD 9/2 AM). IV heparin to start 9/2 PM.  01/13/22 aPTT still slightly low despite rate being increased early this morning from 1300 to 1450 units/hr Heparin level remains falsely elevated at > 1.1 due to lingering effects of apixaban Hgb 9.6, Pltc wnl No bleeding noted per RN, but RN reports heparin being off at 0700 but not known how long it might have been off   Goal of Therapy:  aPTT 66-102 seconds Monitor platelets by anticoagulation protocol: Yes   Plan:  Increase IV heparin from 1450 units/hr slightly to 1550 units/hr since not known how long was off early this morning Recheck aPTT 8 hrs after heparin rate increased Daily HL and aPTT and  CBC Await plan as to when heart cath at [facility] will be scheduled   Adrian Saran, PharmD, BCPS Secure Chat if ?s 01/13/2022 12:18 PM

## 2022-01-13 NOTE — Care Management Important Message (Signed)
Important Message  Patient Details IM Letter given to the Patient Name: Phillip Rich MRN: [id number] Date of Birth: 1950/07/18   Medicare Important Message Given:  Yes     Kerin Salen 01/13/2022, 12:30 PM

## 2022-01-13 NOTE — Progress Notes (Signed)
Progress Note  Patient Name: Phillip Rich Date of Encounter: 01/13/2022  Primary Cardiologist:   Minus Breeding, MD   Subjective   Breathing better.  Urinated more last night.    Inpatient Medications    Scheduled Meds:  DULoxetine  60 mg Oral Daily   furosemide  80 mg Intravenous BID   hydrALAZINE  10 mg Oral Q8H   insulin aspart  0-15 Units Subcutaneous TID WC   insulin aspart  0-5 Units Subcutaneous QHS   insulin aspart  6 Units Subcutaneous TID WC   insulin glargine-yfgn  10 Units Subcutaneous Daily   isosorbide mononitrate  30 mg Oral Daily   pantoprazole  80 mg Oral Daily   simvastatin  20 mg Oral q morning   zolpidem  5 mg Oral QHS   Continuous Infusions:  heparin 1,450 Units/hr (01/13/22 0412)   magnesium sulfate bolus IVPB     PRN Meds: acetaminophen **OR** acetaminophen, albuterol, fluticasone, guaiFENesin-dextromethorphan, hydrALAZINE, ondansetron **OR** ondansetron (ZOFRAN) IV, polyethylene glycol   Vital Signs    Vitals:   01/12/22 1405 01/12/22 2102 01/13/22 0500 01/13/22 0532  BP: 120/75 (!) 131/91  129/82  Pulse: (!) 102 (!) 105  (!) 101  Resp: '18 18  18  '$ Temp: 97.6 F (36.4 C) 97.6 F (36.4 C)  (!) 97.5 F (36.4 C)  TempSrc: Oral Oral  Oral  SpO2: 98% 98%  97%  Weight:   88.6 kg   Height:        Intake/Output Summary (Last 24 hours) at 01/13/2022 0842 Last data filed at 01/13/2022 0805 Gross per 24 hour  Intake 489.04 ml  Output 1150 ml  Net -660.96 ml   Filed Weights   01/10/22 0600 01/13/22 0500  Weight: 90.1 kg 88.6 kg    Telemetry    NSR, ST - Personally Reviewed  ECG    NA - Personally Reviewed  Physical Exam   GEN: No  acute distress.   Neck: No  JVD Cardiac: RRR, no murmurs, rubs, or gallops.  Respiratory:     Basilar crackles improved.  GI: Soft, nontender, non-distended, normal bowel sounds  MS:  Trace edema; No deformity. Neuro:   Nonfocal  Psych: Oriented and appropriate    Labs    Chemistry Recent  Labs  Lab 01/10/22 0526 01/11/22 0456 01/12/22 0534 01/13/22 0136  NA 136 136 132* 135  K 3.9 4.2 3.8 3.8  CL 102 100 96* 100  CO2 '23 29 26 28  '$ GLUCOSE 249* 159* 251* 179*  BUN 45* 41* 41* 39*  CREATININE 1.28* 1.37* 1.33* 1.19  CALCIUM 8.8* 8.9 8.9 8.8*  PROT 6.5  --   --   --   ALBUMIN 2.9*  --   --   --   AST 75*  --   --   --   ALT 172*  --   --   --   ALKPHOS 109  --   --   --   BILITOT 1.0  --   --   --   GFRNONAA 60* 55* 57* >60  ANIONGAP '11 7 10 7     '$ Hematology Recent Labs  Lab 01/10/22 0526 01/12/22 0534 01/13/22 0136  WBC 10.5 11.9* 13.1*  RBC 3.30* 3.59* 3.15*  HGB 9.9* 10.8* 9.6*  HCT 30.8* 34.2* 29.7*  MCV 93.3 95.3 94.3  MCH 30.0 30.1 30.5  MCHC 32.1 31.6 32.3  RDW 15.2 15.7* 15.8*  PLT 279 304 244    Cardiac EnzymesNo  results for input(s): "TROPONINI" in the last 168 hours. No results for input(s): "TROPIPOC" in the last 168 hours.   BNP Recent Labs  Lab 01/09/22 1400 01/10/22 0526  BNP 2,249.9* 1,972.3*     DDimer  No results for input(s): "DDIMER" in the last 168 hours.    Radiology    No results found.  Cardiac Studies   Echo see above.   Patient Profile     71 y.o. male with a hx of DM, HTN and sleep apnea who is being seen for the evaluation of dyspnea at the request of Dr. [place]  Found to have a newly diagnosed severe cardiomyopathy.      Assessment & Plan    Acute systolic congestive heart failure:  Net negative at lease 3.2 liters .  Creat is improved.   Increased Lasix and added hydril/nitrates.  After cath start Phillip Rich.   AKI:  Creat is improved.  Follow post cath.    Acute pulmonary embolism: Heparin for now.  DOAC at discharge for provoked PE.    Hypertension:  This is being managed in the context of treating his CHF   Diabetes mellitus: Plan per primary team.  A1c 8.3. Will suggest SGLT2i or GLP1ra per primary team pending the final dosing of his insulin.     For questions or updates, please  contact [place] Please consult [email or web address] for contact info under Cardiology/STEMI.   Signed, Minus Breeding, MD  01/13/2022, 8:42 AM

## 2022-01-14 ENCOUNTER — Encounter (HOSPITAL_COMMUNITY)
Admission: EM | Disposition: A | Discharge status: Home / Self Care | Payer: Self-pay | Source: Home / Self Care | Attending: Internal Medicine

## 2022-01-14 DIAGNOSIS — I5021 Acute systolic (congestive) heart failure: Secondary | ICD-10-CM | POA: Diagnosis not present

## 2022-01-14 LAB — BASIC METABOLIC PANEL
Anion gap: 12 (ref 5–15)
BUN: 37 mg/dL — ABNORMAL HIGH (ref 8–23)
CO2: 29 mmol/L (ref 22–32)
Calcium: 9 mg/dL (ref 8.9–10.3)
Chloride: 96 mmol/L — ABNORMAL LOW (ref 98–111)
Creatinine, Ser: 1.22 mg/dL (ref 0.61–1.24)
GFR, Estimated: >60 mL/min (ref >60)
Glucose, Bld: 177 mg/dL — ABNORMAL HIGH (ref 70–99)
Potassium: 4.5 mmol/L (ref 3.5–5.1)
Sodium: 137 mmol/L (ref 135–145)

## 2022-01-14 LAB — CBC
HCT: 33.1 % — ABNORMAL LOW (ref 39.0–52.0)
Hemoglobin: 10.4 g/dL — ABNORMAL LOW (ref 13.0–17.0)
MCH: 30.1 pg (ref 26.0–34.0)
MCHC: 31.4 g/dL (ref 30.0–36.0)
MCV: 95.7 fL (ref 80.0–100.0)
Platelets: 258 10*3/uL (ref 150–400)
RBC: 3.46 MIL/uL — ABNORMAL LOW (ref 4.22–5.81)
RDW: 15.9 % — ABNORMAL HIGH (ref 11.5–15.5)
WBC: 10.9 10*3/uL — ABNORMAL HIGH (ref 4.0–10.5)
nRBC: 0 % (ref 0.0–0.2)

## 2022-01-14 LAB — HEPARIN LEVEL (UNFRACTIONATED): Heparin Unfractionated: >1.1 IU/mL — ABNORMAL HIGH (ref 0.30–0.70)

## 2022-01-14 LAB — GLUCOSE, CAPILLARY
Glucose-Capillary: 180 mg/dL — ABNORMAL HIGH (ref 70–99)
Glucose-Capillary: 186 mg/dL — ABNORMAL HIGH (ref 70–99)
Glucose-Capillary: 367 mg/dL — ABNORMAL HIGH (ref 70–99)

## 2022-01-14 LAB — APTT: aPTT: 135 seconds — ABNORMAL HIGH (ref 24–36)

## 2022-01-14 SURGERY — LEFT HEART CATH AND CORONARY ANGIOGRAPHY
Anesthesia: LOCAL

## 2022-01-14 MED ORDER — SODIUM CHLORIDE 0.9 % IV SOLN: 250.0000 mL | INTRAVENOUS | Status: DC | PRN

## 2022-01-14 MED ORDER — ASPIRIN 81 MG PO CHEW: 81.0000 mg | CHEWABLE_TABLET | ORAL | Status: DC

## 2022-01-14 MED ORDER — SODIUM CHLORIDE 0.9% FLUSH: 3.0000 mL | INTRAVENOUS | Status: DC | PRN

## 2022-01-14 MED ORDER — SODIUM CHLORIDE 0.9 % WEIGHT BASED INFUSION
1.0000 mL/kg/h | INTRAVENOUS | Status: DC
  Administered 2022-01-14: 1 mL/kg/h via INTRAVENOUS

## 2022-01-14 MED ORDER — SODIUM CHLORIDE 0.9 % WEIGHT BASED INFUSION
3.0000 mL/kg/h | INTRAVENOUS | Status: AC
  Administered 2022-01-14: 3 mL/kg/h via INTRAVENOUS

## 2022-01-14 MED ORDER — FUROSEMIDE 40 MG PO TABS
40.0000 mg | ORAL_TABLET | Freq: Every day | ORAL | Status: DC
  Administered 2022-01-14: 40 mg via ORAL
  Filled 2022-01-14: qty 1

## 2022-01-14 MED ORDER — SODIUM CHLORIDE 0.9% FLUSH: 3.0000 mL | Freq: Two times a day (BID) | INTRAVENOUS | Status: DC

## 2022-01-14 NOTE — Progress Notes (Signed)
Mobility Specialist - Progress Note     01/14/22 1354  Mobility  HOB Elevated/Bed Position Self regulated  Activity Ambulated independently in hallway  Range of Motion/Exercises Active  Level of Assistance Independent after set-up  Assistive Device None  Distance Ambulated (ft) 1000 ft  Activity Response Tolerated well  $Mobility charge 1 Mobility   Pt was found in bed and agreeable to mobilize. Fatigued getting closure towards the end and stated that it was about as much as he could do. At the EOS returned back to bed with all necessities in reach.  Ferd Hibbs Mobility Specialist

## 2022-01-14 NOTE — Progress Notes (Addendum)
Rounding Note    Patient Name: Phillip Rich Date of Encounter: 01/14/2022  [place] Cardiologist: Minus Breeding, MD   Subjective   He is feeling much improved, not SOB, leg swelling had resolved, he is asking when can he have the cardiac catheterization today and feels hungry.   Inpatient Medications    Scheduled Meds:  [START ON 01/15/2022] aspirin  81 mg Oral Pre-Cath   DULoxetine  60 mg Oral Daily   furosemide  40 mg Oral Daily   hydrALAZINE  10 mg Oral Q8H   insulin aspart  0-15 Units Subcutaneous TID WC   insulin aspart  0-5 Units Subcutaneous QHS   insulin aspart  6 Units Subcutaneous TID WC   insulin glargine-yfgn  10 Units Subcutaneous Daily   isosorbide mononitrate  30 mg Oral Daily   pantoprazole  80 mg Oral Daily   simvastatin  20 mg Oral q morning   zolpidem  5 mg Oral QHS   Continuous Infusions:  heparin 1,500 Units/hr (01/14/22 0046)   PRN Meds: acetaminophen **OR** acetaminophen, albuterol, fluticasone, guaiFENesin-dextromethorphan, hydrALAZINE, ondansetron **OR** ondansetron (ZOFRAN) IV, polyethylene glycol   Vital Signs    Vitals:   01/13/22 2011 01/14/22 0500 01/14/22 0555 01/14/22 0859  BP: 127/88  123/84 134/88  Pulse: 100  98 99  Resp: 16  16   Temp: 98.7 F (37.1 C)  97.7 F (36.5 C)   TempSrc: Oral  Oral   SpO2: 99%  98% 99%  Weight:  87.9 kg    Height:        Intake/Output Summary (Last 24 hours) at 01/14/2022 0908 Last data filed at 01/14/2022 0400 Gross per 24 hour  Intake 1249.48 ml  Output 1700 ml  Net -450.52 ml      01/14/2022    5:00 AM 01/13/2022    5:00 AM 01/10/2022    6:00 AM  Last 3 Weights  Weight (lbs) 193 lb 11.2 oz 195 lb 5.2 oz 198 lb 11.2 oz  Weight (kg) 87.862 kg 88.6 kg 90.13 kg      Telemetry    Sinus rhythm 90-100s, occasional PVCs  - Personally Reviewed  ECG    No new EKG today - Personally Reviewed  Physical Exam   GEN: No acute distress.   Neck: No JVD Cardiac: RRR, no murmurs,  rubs, or gallops.  Respiratory: Clear to auscultation bilaterally. On room air. Speaks full sentence  GI: Soft, nontender MS: No edema of BLE, Compression stocking in place Neuro:  Nonfocal  Psych: Normal affect   Labs    High Sensitivity Troponin:   Recent Labs  Lab 01/05/22 1211 01/09/22 1400 01/09/22 1457  TROPONINIHS 81* 100* 99*     Chemistry Recent Labs  Lab 01/10/22 0526 01/11/22 0456 01/12/22 0534 01/13/22 0136 01/14/22 0424  NA 136   < > 132* 135 137  K 3.9   < > 3.8 3.8 4.5  CL 102   < > 96* 100 96*  CO2 23   < > '26 28 29  '$ GLUCOSE 249*   < > 251* 179* 177*  BUN 45*   < > 41* 39* 37*  CREATININE 1.28*   < > 1.33* 1.19 1.22  CALCIUM 8.8*   < > 8.9 8.8* 9.0  MG  --   --   --  1.6*  --   PROT 6.5  --   --   --   --   ALBUMIN 2.9*  --   --   --   --  AST 75*  --   --   --   --   ALT 172*  --   --   --   --   ALKPHOS 109  --   --   --   --   BILITOT 1.0  --   --   --   --   GFRNONAA 60*   < > 57* >60 >60  ANIONGAP 11   < > '10 7 12   '$ < > = values in this interval not displayed.    Lipids  Recent Labs  Lab 01/10/22 0526  CHOL 120  TRIG 101  HDL 23*  LDLCALC 77  CHOLHDL 5.2    Hematology Recent Labs  Lab 01/12/22 0534 01/13/22 0136 01/14/22 0424  WBC 11.9* 13.1* 10.9*  RBC 3.59* 3.15* 3.46*  HGB 10.8* 9.6* 10.4*  HCT 34.2* 29.7* 33.1*  MCV 95.3 94.3 95.7  MCH 30.1 30.5 30.1  MCHC 31.6 32.3 31.4  RDW 15.7* 15.8* 15.9*  PLT 304 244 258   Thyroid  Recent Labs  Lab 01/09/22 2139  TSH 2.835    BNP Recent Labs  Lab 01/09/22 1400 01/10/22 0526  BNP 2,249.9* 1,972.3*    DDimer No results for input(s): "DDIMER" in the last 168 hours.   Radiology    No results found.  Cardiac Studies    Echo from 01/10/22:  1. Left ventricular ejection fraction, by estimation, is 15-20%. The left  ventricle has severely decreased function. The left ventricle demonstrates  global hypokinesis. The left ventricular internal cavity size was mildly   dilated. Left ventricular  diastolic parameters are indeterminate.   2. Right ventricular systolic function is moderately reduced. The right  ventricular size is mildly enlarged. There is mildly elevated pulmonary  artery systolic pressure. The estimated right ventricular systolic  pressure is 46.5 mmHg.   3. The mitral valve is grossly normal. Moderate mitral valve  regurgitation - jet of MR is likely eccentric, and may be underestimated.  No evidence of mitral stenosis.   4. Tricuspid valve regurgitation is moderate.   5. The aortic valve is grossly normal. There is mild calcification of the  aortic valve. Aortic valve regurgitation is not visualized. No aortic  stenosis is present.   6. The inferior vena cava is normal in size with <50% respiratory  variability, suggesting right atrial pressure of 8 mmHg.   Conclusion(s)/Recommendation(s): No left ventricular mural or apical  thrombus/thrombi.   Patient Profile     71 y.o. male with PMH of type 2 DM, HTN, OSA, PE, HLD, anxiety, who presented with SOB, cardiology is consulted and following for acute systolic heart failure.   Assessment & Plan    Acute systolic heart failure, new diagnosis  - presented with SOB, had acute PE diagnosed recently  - Hs trop flat around 100s - BNP 2249 - CXR pneumonia versus edema  - EKG without acute ischemic changes  - Echo showed LVEF 15-20%, no prior Echo to compare, global HPK, moderately reduce RV, RVSP 70mHg, moderate MR and TR - not on diuretic PTA, s/p IV diuresis with Lasix '80mg'$  BID, Net -3.6L, weight is down from 198 >193 ibs since admission, clinically euvolemic today, will transition to PO Lasix '40mg'$  daily today  - GDMT: continue hydralazine and imdur, start Metoprolol XL '25mg'$  daily today, plan to add Entresto, spironolactone, and Farixga post cath - suspect  PE mediated CHF, LHC planned today for ischemic evaluation    AKI, POA - Cr 1.3 POA,  baseline appears around 0.9 - possible  CRS  - Cr stable /improving, non-oliguric  - will transition to PO Lasix today , monitor BMP daily  - plan to add  Entresto, spironolactone, and Farixga post cath if renal function allows (stop PTA ARB)  Acute PE - possibly provoked by recent travel  - on heparin gtt, resume Eliquis post cath   Type 2 DM HTN Leukocytosis  Anemia - per primary team     For questions or updates, please contact [place] Please consult [email or web address] for contact info under        Signed, Margie Billet, NP  01/14/2022, 9:08 AM    Personally seen and examined. Agree with above.  71 year old with newly discovered ejection fraction 15 to 20% with recent acute pulmonary embolism on heparin IV.  Diuresed well.  Laying flat, comfortable, currently sleeping at peace.  Post catheterization plan to add further goal-directed therapy with Entresto spironolactone and Farxiga as renal function allows.  Resume Eliquis for treatment of PE post catheterization.  Creatinine 1.22.  EKG previous sinus tachycardia 109  Avoid right heart catheterization given recent pulmonary embolism.  Candee Furbish, MD

## 2022-01-14 NOTE — Progress Notes (Signed)
Mobility Specialist - Progress Note    01/14/22 1040  Mobility  HOB Elevated/Bed Position Self regulated  Activity Ambulated independently in hallway  Range of Motion/Exercises Active  Level of Assistance Independent after set-up  Assistive Device None  Distance Ambulated (ft) 700 ft  Activity Response Tolerated well  $Mobility charge 1 Mobility   Pt was found in bed and agreeable to mobilize. Had one cross step during ambulation and at EOS returned to bed with all necessities in reach.  Ferd Hibbs Mobility Specialist

## 2022-01-14 NOTE — Progress Notes (Signed)
ANTICOAGULATION CONSULT NOTE - Follow Up Consult  Pharmacy Consult for Heparin (PTA apixaban on hold) Indication: pulmonary embolus  Allergies  Allergen Reactions   Codeine Nausea And Vomiting   Crestor [Rosuvastatin Calcium] Other (See Comments)    Leg aches   Erythromycin Rash    All Mycin drugs    Patient Measurements: Height: '5\' 9"'$  (175.3 cm) Weight: 87.9 kg (193 lb 11.2 oz) IBW/kg (Calculated) : 70.7 Heparin Dosing Weight:  89 kg  Vital Signs: Temp: 97.7 F (36.5 C) (09/05 0555) Temp Source: Oral (09/05 0555) BP: 134/88 (09/05 0859) Pulse Rate: 99 (09/05 0859)  Labs: Recent Labs    01/11/22 1938 01/11/22 1938 01/12/22 0534 01/12/22 1418 01/13/22 0136 01/13/22 1129 01/13/22 2134 01/14/22 0424  HGB  --    < > 10.8*  --  9.6*  --   --  10.4*  HCT  --   --  34.2*  --  29.7*  --   --  33.1*  PLT  --   --  304  --  244  --   --  258  APTT 47*  --  >200*   < > 65* 58* 113* 135*  HEPARINUNFRC >1.10*  --   --   --  >1.10*  --   --  >1.10*  CREATININE  --   --  1.33*  --  1.19  --   --  1.22   < > = values in this interval not displayed.     Estimated Creatinine Clearance: 61 mL/min (by C-G formula based on SCr of 1.22 mg/dL).   Assessment: AC/Heme: Acute PE diagnosed 8/27 from long trip, negative DVT - continues on apixaban '10mg'$  BID thru 9/2 then '5mg'$  BID (LD 9/2 AM). IV heparin to start 9/2 PM.  01/14/22 aPTT 135 supra-therapeutic on 1500 units/hr Per RN no bleeding noted  Goal of Therapy:  aPTT 66-102 seconds Monitor platelets by anticoagulation protocol: Yes   Plan:  Decrease heparin drip to 1350 units/hr Recheck aPTT in 8 hrs  Daily HL and aPTT and CBC Await plan as to when heart cath at [facility] will be scheduled  Royetta Asal, PharmD, BCPS 01/14/2022 10:39 AM

## 2022-01-14 NOTE — Progress Notes (Signed)
Pt brought to cath lab holding, bay 4, connected to monitor, accu-check done and resulted at 130, denies pain, see flowsheets for VS, safety maintained

## 2022-01-14 NOTE — Progress Notes (Signed)
ANTICOAGULATION CONSULT NOTE - Follow Up Consult  Pharmacy Consult for Heparin (PTA apixaban on hold) Indication: pulmonary embolus  Allergies  Allergen Reactions   Codeine Nausea And Vomiting   Crestor [Rosuvastatin Calcium] Other (See Comments)    Leg aches   Erythromycin Rash    All Mycin drugs    Patient Measurements: Height: '5\' 9"'$  (175.3 cm) Weight: 88.6 kg (195 lb 5.2 oz) IBW/kg (Calculated) : 70.7 Heparin Dosing Weight:  89 kg  Vital Signs: Temp: 98.7 F (37.1 C) (09/04 2011) Temp Source: Oral (09/04 2011) BP: 127/88 (09/04 2011) Pulse Rate: 100 (09/04 2011)  Labs: Recent Labs    01/11/22 0456 01/11/22 1938 01/11/22 1938 01/12/22 0534 01/12/22 1418 01/13/22 0136 01/13/22 1129 01/13/22 2134  HGB  --   --   --  10.8*  --  9.6*  --   --   HCT  --   --   --  34.2*  --  29.7*  --   --   PLT  --   --   --  304  --  244  --   --   APTT  --  47*   < > >200*   < > 65* 58* 113*  HEPARINUNFRC  --  >1.10*  --   --   --  >1.10*  --   --   CREATININE 1.37*  --   --  1.33*  --  1.19  --   --    < > = values in this interval not displayed.     Estimated Creatinine Clearance: 62.7 mL/min (by C-G formula based on SCr of 1.19 mg/dL).   Assessment: AC/Heme: Acute PE diagnosed 8/27 from long trip, negative DVT - continues on apixaban '10mg'$  BID thru 9/2 then '5mg'$  BID (LD 9/2 AM). IV heparin to start 9/2 PM.  01/14/22 aPTT 113 supra-therapeutic on 1550 units/hr Per RN no bleeding noted  Goal of Therapy:  aPTT 66-102 seconds Monitor platelets by anticoagulation protocol: Yes   Plan:  Decrease heparin drip to 1500 units/hr Recheck aPTT in 8 hrs  Daily HL and aPTT and CBC Await plan as to when heart cath at [facility] will be scheduled   Dolly Rias RPh 01/14/2022, 12:10 AM

## 2022-01-14 NOTE — Progress Notes (Signed)
PROGRESS NOTE    Phillip Rich  UYQ:[id number] DOB: 11/15/1950 DOA: 01/09/2022 PCP: Lajean Manes, MD    Brief Narrative:   Phillip Rich is a 71 y.o. male with past medical history significant for  type 2 diabetes mellitus, HTN, BPH, peripheral neuropathy, depression, history of left renal mass s/p partial nephrectomy, history of previous tobacco use disorder who presents to [facility] ED on 8/31 with progressive shortness of breath.  Patient was recently seen in the ED on 8/27 and diagnosed with acute pulmonary embolism and concern for vascular congestion from underlying CHF; but unfortunately patient left AMA but was prescribed Eliquis and Lasix on discharge from the ED.  Initially patient reports symptom onset roughly 1 week ago after recent travel to [place] in which he flew on an airplane and following return started experiencing some weight gain and lower extremity edema.  He has been having difficulty laying flat and finding a position of comfort.  He also reports difficulty ambulating even 20 feet without having stopping and resting.  He was seen by his PCP and directed to the ED for further evaluation.  He reports a roughly 11 pound weight gain over the last week.  He reports compliance with the majority of his medications but does admit to missing some of his insulin occasionally.  He does not recall ever having an echocardiogram, never seen a cardiologist in the past but was recently referred to a pulmonologist with also concerns about obstructive sleep apnea.  Patient denies headache, no dizziness, no chest pain, no fever/chills/night sweats, no nausea/vomiting/diarrhea, no abdominal pain, no focal weakness, no cough, no paresthesias.   In the ED, temperature 98.0 F, HR 109, RR 15, BP 133/87, SPO2 100% on room air.  WBC 11.7, hemoglobin 11.1, platelets 366.  Sodium 133, potassium 4.5, chloride 102, CO2 24, glucose 330, BUN 45, creatinine 1.30.  High sensitive troponin 100>99.  BNP 2249.9.   Chest x-ray with bibasilar opacities representing pneumonia or pulmonary edema with interval increase in central venous congestion.  EKG personally reviewed with sinus tachycardia, rate 109, T wave inversion in lead I/aVL and V6 which is similar in appearance to EKG dated 01/05/2022.  Patient was given an albuterol neb treatment, Lasix in the ED.  EDP consulted [facility] for further evaluation and management of CHF exacerbation that is undifferentiated and recent pulmonary embolism.  Assessment & Plan:   Acute systolic congestive heart failure exacerbation Patient presenting to ED with progressive shortness of breath, worse with exertion and reported 11 pound weight gain over the last 1 week.  No previous history of known CHF, has never seen a cardiologist in the past and no echocardiogram available for review in EMR.  Patient with elevated BNP of 2249, chest x-ray with findings of progressive vascular congestion since 4 days prior and physical exam findings of positive JVP and lower extremity edema.  TTE with LVEF 15-20%, LV severely decreased function with global hypokinesis, LV internal cavity mildly dilated, IVC normal. -- Cardiology following, appreciate assistance -- net negative 3.6L since admission with several unmeasured urinary occurrences -- IV furosemide transitioned to 40 mg p.o. daily today -- Hydralazine '10mg'$  PO q8h  --Isosorbide mononitrate 30 mg p.o. daily -- Fluid restriction 1800 mL/day -- Strict I's and O's and daily weights -- Monitor BMP daily -- Cardiology plans left heart catheterization to rule out ischemic cause of heart failure today, n.p.o.   Acute pulmonary embolism Patient recently traveled via airplane to [place] 1 week ago.  Since has  had increased shortness of breath, lower extremity edema and weight gain.  Diagnosed 4 days prior in the ED with acute pulmonary embolism; unfortunate left AMA but was started on Eliquis.  Patient denies chest pain.  Vascular duplex ultrasound  bilateral lower extremities negative for DVT. --Eliquis now on hold, on heparin drip for planned left heart catheterization per cardiology   Acute renal failure Creatinine elevated 1.30 on admission, was 0.91 on 01/05/2022.  Suspect etiology from volume overload. -- Cr 1.30>1.28>1.37>1.33>1.19>1.22 -- Hold home losartan -- Lasix as above -- Avoid nephrotoxins, renally dose all medications -- BMP daily   Elevated troponin High sensitive troponin elevated 100 followed by 99.  EKG with nonspecific T wave inversions lead I, aVL and V6 that were present previously on 8/27 without change.  Patient denies chest pain.  Etiology likely secondary to type II demand ischemia in the setting of volume overload as above. -- Monitor on telemetry   Leukocytosis WBC slightly elevated 11.7.  Patient denies cough, afebrile and chest x-ray findings likely more related to pulmonary edema. -- WBC 11.7>>10.5>13.1>10.9 and PCT <0.10 -- No indication for antibiotics; monitor clinically   Hyponatremia Etiology likely hypervolemic hyponatremia in the setting of volume overload.  Sodium 133 on admission.  Treatment as above with IV diuresis. -- Na 133>136>132>135>137 -- BMP daily   Essential hypertension -- Hold home ARB/ACE inhibitor for AKI -- Hydralazine '10mg'$  PO q8h -- Isosorbide mononitrate 30 mg p.o. daily -- Hydralazine 25 mg p.o. q8h PRN SBP >165 or DBP >110   Type 2 diabetes mellitus, with hyperglycemia Home regimen includes metformin 1000 mg p.o. twice daily, semaglutide 0.5 mg injected once weekly, Humalog 12 units twice daily.  Hemoglobin A1c 8.3, not optimally controlled. -- Semglee 10u [place] daily -- NovoLog 6 units TIDAC -- Moderate SSI for coverage -- CBG before every meal/at bedtime   Allergies --Claritin, Flonase   Weakness/debility/deconditioning: Patient lives alone, having increased difficulty ambulating.  Does not use any walking aid at baseline. --PT evaluation with no needs  identified     DVT prophylaxis: Place TED hose Start: 01/11/22 1012    Code Status: Full Code Family Communication: No family present at bedside this morning  Disposition Plan:  Level of care: Telemetry Status is: Inpatient Remains inpatient appropriate because: Heparin drip, cardiology plans left heart catheterization today    Consultants:  Cardiology, Dr. Percival Spanish  Procedures:  TTE:  Vascular duplex ultrasound:   Antimicrobials:  None   Subjective: Patient seen examined bedside, resting comfortably.  Lying in bed.  No specific complaints this morning.  Awaiting left heart catheterization planned today.  No other questions or concerns at this time.  Denies headache, no dizziness, no chest pain, no palpitations, no shortness of breath, no abdominal pain, no fever/chills/night sweats, no nausea/vomiting/diarrhea, no cough/congestion, no fatigue, no paresthesias, no focal weakness.  No acute concerns overnight per nursing staff.  Objective: Vitals:   01/13/22 2011 01/14/22 0500 01/14/22 0555 01/14/22 0859  BP: 127/88  123/84 134/88  Pulse: 100  98 99  Resp: 16  16   Temp: 98.7 F (37.1 C)  97.7 F (36.5 C)   TempSrc: Oral  Oral   SpO2: 99%  98% 99%  Weight:  87.9 kg    Height:        Intake/Output Summary (Last 24 hours) at 01/14/2022 1133 Last data filed at 01/14/2022 0400 Gross per 24 hour  Intake 1249.48 ml  Output 1600 ml  Net -350.52 ml   [employer]  01/10/22 0600 01/13/22 0500 01/14/22 0500  Weight: 90.1 kg 88.6 kg 87.9 kg    Examination:  Physical Exam: GEN: NAD, alert and oriented x 3, wd/wn HEENT: NCAT, PERRL, EOMI, sclera clear, MMM PULM: Breath sounds slightly diminished bilateral bases, no crackles/wheezing, normal respiratory effort without accessory muscle use, on room air CV: RRR w/o M/G/R,  GI: abd soft, NTND, NABS, no R/G/M MSK: 2+ pitting edema bilateral lower extremities up to knee, muscle strength globally intact 5/5 bilateral  upper/lower extremities NEURO: CN II-XII intact, no focal deficits, sensation to light touch intact PSYCH: normal mood/affect Integumentary: Chronic venous changes to bilateral lower extremities, multiple areas of ecchymosis to extremities in various stages of healing, otherwise no other concerning rashes/lesions/wounds to exposed skin regions.     Data Reviewed: I have personally reviewed following labs and imaging studies  CBC: Recent Labs  Lab 01/09/22 1400 01/10/22 0526 01/12/22 0534 01/13/22 0136 01/14/22 0424  WBC 11.7* 10.5 11.9* 13.1* 10.9*  NEUTROABS 9.0*  --   --   --   --   HGB 11.1* 9.9* 10.8* 9.6* 10.4*  HCT 35.2* 30.8* 34.2* 29.7* 33.1*  MCV 94.9 93.3 95.3 94.3 95.7  PLT 366 279 304 244 983   Basic Metabolic Panel: Recent Labs  Lab 01/10/22 0526 01/11/22 0456 01/12/22 0534 01/13/22 0136 01/14/22 0424  NA 136 136 132* 135 137  K 3.9 4.2 3.8 3.8 4.5  CL 102 100 96* 100 96*  CO2 '23 29 26 28 29  '$ GLUCOSE 249* 159* 251* 179* 177*  BUN 45* 41* 41* 39* 37*  CREATININE 1.28* 1.37* 1.33* 1.19 1.22  CALCIUM 8.8* 8.9 8.9 8.8* 9.0  MG  --   --   --  1.6*  --    GFR: Estimated Creatinine Clearance: 61 mL/min (by C-G formula based on SCr of 1.22 mg/dL). Liver Function Tests: Recent Labs  Lab 01/10/22 0526  AST 75*  ALT 172*  ALKPHOS 109  BILITOT 1.0  PROT 6.5  ALBUMIN 2.9*   No results for input(s): "LIPASE", "AMYLASE" in the last 168 hours. No results for input(s): "AMMONIA" in the last 168 hours. Coagulation Profile: No results for input(s): "INR", "PROTIME" in the last 168 hours. Cardiac Enzymes: No results for input(s): "CKTOTAL", "CKMB", "CKMBINDEX", "TROPONINI" in the last 168 hours. BNP (last 3 results) No results for input(s): "PROBNP" in the last 8760 hours. HbA1C: No results for input(s): "HGBA1C" in the last 72 hours.  CBG: Recent Labs  Lab 01/13/22 1135 01/13/22 1658 01/13/22 2127 01/14/22 0800 01/14/22 1130  GLUCAP 226* 198* 139*  180* 186*   Lipid Profile: No results for input(s): "CHOL", "HDL", "LDLCALC", "TRIG", "CHOLHDL", "LDLDIRECT" in the last 72 hours.  Thyroid Function Tests: No results for input(s): "TSH", "T4TOTAL", "FREET4", "T3FREE", "THYROIDAB" in the last 72 hours.  Anemia Panel: No results for input(s): "VITAMINB12", "FOLATE", "FERRITIN", "TIBC", "IRON", "RETICCTPCT" in the last 72 hours. Sepsis Labs: Recent Labs  Lab 01/09/22 [place] <0.10    Recent Results (from the past 240 hour(s))  Resp Panel by RT-PCR (Flu A&B, Covid) Anterior Nasal Swab     Status: None   Collection Time: 01/05/22  1:24 PM   Specimen: Anterior Nasal Swab  Result Value Ref Range Status   SARS Coronavirus 2 by RT PCR NEGATIVE NEGATIVE Final    Comment: (NOTE) SARS-CoV-2 target nucleic acids are NOT DETECTED.  The SARS-CoV-2 RNA is generally detectable in upper respiratory specimens during the acute phase of infection. The lowest concentration  of SARS-CoV-2 viral copies this assay can detect is 138 copies/mL. A negative result does not preclude SARS-Cov-2 infection and should not be used as the sole basis for treatment or other patient management decisions. A negative result may occur with  improper specimen collection/handling, submission of specimen other than nasopharyngeal swab, presence of viral mutation(s) within the areas targeted by this assay, and inadequate number of viral copies(<138 copies/mL). A negative result must be combined with clinical observations, patient history, and epidemiological information. The expected result is Negative.  Fact Sheet for Patients:  EntrepreneurPulse.com.au  Fact Sheet for Healthcare Providers:  IncredibleEmployment.be  This test is no t yet approved or cleared by the [place] FDA and  has been authorized for detection and/or diagnosis of SARS-CoV-2 by FDA under an Emergency Use Authorization (EUA). This EUA will  remain  in effect (meaning this test can be used) for the duration of the COVID-19 declaration under Section 564(b)(1) of the Act, 21 U.S.C.section 360bbb-3(b)(1), unless the authorization is terminated  or revoked sooner.       Influenza A by PCR NEGATIVE NEGATIVE Final   Influenza B by PCR NEGATIVE NEGATIVE Final    Comment: (NOTE) The Xpert Xpress SARS-CoV-2/FLU/RSV plus assay is intended as an aid in the diagnosis of influenza from Nasopharyngeal swab specimens and should not be used as a sole basis for treatment. Nasal washings and aspirates are unacceptable for Xpert Xpress SARS-CoV-2/FLU/RSV testing.  Fact Sheet for Patients: EntrepreneurPulse.com.au  Fact Sheet for Healthcare Providers: IncredibleEmployment.be  This test is not yet approved or cleared by the [place] FDA and has been authorized for detection and/or diagnosis of SARS-CoV-2 by FDA under an Emergency Use Authorization (EUA). This EUA will remain in effect (meaning this test can be used) for the duration of the COVID-19 declaration under Section 564(b)(1) of the Act, 21 U.S.C. section 360bbb-3(b)(1), unless the authorization is terminated or revoked.  Performed at [facility], [address] [place], [place] [address]          Radiology Studies: No results found.      Scheduled Meds:  [START ON 01/15/2022] aspirin  81 mg Oral Pre-Cath   DULoxetine  60 mg Oral Daily   furosemide  40 mg Oral Daily   hydrALAZINE  10 mg Oral Q8H   insulin aspart  0-15 Units Subcutaneous TID WC   insulin aspart  0-5 Units Subcutaneous QHS   insulin aspart  6 Units Subcutaneous TID WC   insulin glargine-yfgn  10 Units Subcutaneous Daily   isosorbide mononitrate  30 mg Oral Daily   pantoprazole  80 mg Oral Daily   simvastatin  20 mg Oral q morning   zolpidem  5 mg Oral QHS   Continuous Infusions:  heparin 1,350 Units/hr (01/14/22 1132)     LOS: 5  days    Time spent: 48 minutes spent on chart review, discussion with nursing staff, consultants, updating family and interview/physical exam; more than 50% of that time was spent in counseling and/or coordination of care.    Ambermarie Honeyman J [place] DO [facility] Available via Epic secure chat 7am-7pm After these hours, please refer to coverage provider listed on [email or web address] 01/14/2022, 11:33 AM

## 2022-01-14 NOTE — Progress Notes (Signed)
ANTICOAGULATION CONSULT NOTE - Follow Up Consult  Pharmacy Consult for Heparin (PTA apixaban on hold) Indication: pulmonary embolus  Allergies  Allergen Reactions   Codeine Nausea And Vomiting   Crestor [Rosuvastatin Calcium] Other (See Comments)    Leg aches   Erythromycin Rash    All Mycin drugs    Patient Measurements: Height: '5\' 9"'$  (175.3 cm) Weight: 87.9 kg (193 lb 11.2 oz) IBW/kg (Calculated) : 70.7 Heparin Dosing Weight:  89 kg  Vital Signs: Temp: 97.7 F (36.5 C) (09/05 1916) Temp Source: Oral (09/05 1916) BP: 143/98 (09/05 1916) Pulse Rate: 103 (09/05 1916)  Labs: Recent Labs    01/12/22 0534 01/12/22 1418 01/13/22 0136 01/13/22 1129 01/13/22 2134 01/14/22 0424  HGB 10.8*  --  9.6*  --   --  10.4*  HCT 34.2*  --  29.7*  --   --  33.1*  PLT 304  --  244  --   --  258  APTT >200*   < > 65* 58* 113* 135*  HEPARINUNFRC  --   --  >1.10*  --   --  >1.10*  CREATININE 1.33*  --  1.19  --   --  1.22   < > = values in this interval not displayed.     Estimated Creatinine Clearance: 61 mL/min (by C-G formula based on SCr of 1.22 mg/dL).   Assessment: AC/Heme: Acute PE diagnosed 8/27 from long trip, negative DVT - continues on apixaban '10mg'$  BID thru 9/2 then '5mg'$  BID (LD 9/2 AM). IV heparin to start 9/2 PM.  Pt was taken to cath today but did not get cath due to an emergency case. Case is now scheduled for tomorrow. We will resume heparin heparin tonight and check level in AM.  Goal of Therapy:  aPTT 66-102 seconds Monitor platelets by anticoagulation protocol: Yes   Plan:  Restart heparin drip 1350 units/hr Recheck aPTT in AM Daily HL and aPTT and CBC  Onnie Boer, PharmD, BCIDP, AAHIVP, CPP Infectious Disease Pharmacist 01/14/2022 7:50 PM

## 2022-01-15 ENCOUNTER — Encounter (HOSPITAL_COMMUNITY): Payer: Self-pay | Admitting: Cardiovascular Disease

## 2022-01-15 ENCOUNTER — Encounter (HOSPITAL_COMMUNITY)
Admission: EM | Disposition: A | Discharge status: Home / Self Care | Payer: Self-pay | Source: Home / Self Care | Attending: Internal Medicine

## 2022-01-15 ENCOUNTER — Other Ambulatory Visit (HOSPITAL_COMMUNITY): Payer: Self-pay

## 2022-01-15 DIAGNOSIS — Z720 Tobacco use: Secondary | ICD-10-CM

## 2022-01-15 DIAGNOSIS — I2609 Other pulmonary embolism with acute cor pulmonale: Secondary | ICD-10-CM | POA: Diagnosis not present

## 2022-01-15 DIAGNOSIS — I2510 Atherosclerotic heart disease of native coronary artery without angina pectoris: Secondary | ICD-10-CM | POA: Diagnosis not present

## 2022-01-15 DIAGNOSIS — N179 Acute kidney failure, unspecified: Secondary | ICD-10-CM | POA: Diagnosis not present

## 2022-01-15 DIAGNOSIS — E119 Type 2 diabetes mellitus without complications: Secondary | ICD-10-CM | POA: Diagnosis not present

## 2022-01-15 DIAGNOSIS — I5021 Acute systolic (congestive) heart failure: Secondary | ICD-10-CM | POA: Diagnosis not present

## 2022-01-15 DIAGNOSIS — I2699 Other pulmonary embolism without acute cor pulmonale: Secondary | ICD-10-CM | POA: Diagnosis present

## 2022-01-15 HISTORY — PX: RIGHT/LEFT HEART CATH AND CORONARY ANGIOGRAPHY: CATH118266

## 2022-01-15 LAB — CBC
HCT: 33.3 % — ABNORMAL LOW (ref 39.0–52.0)
Hemoglobin: 10.8 g/dL — ABNORMAL LOW (ref 13.0–17.0)
MCH: 30.3 pg (ref 26.0–34.0)
MCHC: 32.4 g/dL (ref 30.0–36.0)
MCV: 93.3 fL (ref 80.0–100.0)
Platelets: 255 10*3/uL (ref 150–400)
RBC: 3.57 MIL/uL — ABNORMAL LOW (ref 4.22–5.81)
RDW: 15.8 % — ABNORMAL HIGH (ref 11.5–15.5)
WBC: 11.3 10*3/uL — ABNORMAL HIGH (ref 4.0–10.5)
nRBC: 0 % (ref 0.0–0.2)

## 2022-01-15 LAB — BASIC METABOLIC PANEL
Anion gap: 10 (ref 5–15)
BUN: 33 mg/dL — ABNORMAL HIGH (ref 8–23)
CO2: 28 mmol/L (ref 22–32)
Calcium: 9.1 mg/dL (ref 8.9–10.3)
Chloride: 98 mmol/L (ref 98–111)
Creatinine, Ser: 1.42 mg/dL — ABNORMAL HIGH (ref 0.61–1.24)
GFR, Estimated: 53 mL/min — ABNORMAL LOW (ref >60)
Glucose, Bld: 220 mg/dL — ABNORMAL HIGH (ref 70–99)
Potassium: 4.7 mmol/L (ref 3.5–5.1)
Sodium: 136 mmol/L (ref 135–145)

## 2022-01-15 LAB — GLUCOSE, CAPILLARY
Glucose-Capillary: 130 mg/dL — ABNORMAL HIGH (ref 70–99)
Glucose-Capillary: 175 mg/dL — ABNORMAL HIGH (ref 70–99)
Glucose-Capillary: 318 mg/dL — ABNORMAL HIGH (ref 70–99)
Glucose-Capillary: 72 mg/dL (ref 70–99)

## 2022-01-15 LAB — APTT: aPTT: 89 seconds — ABNORMAL HIGH (ref 24–36)

## 2022-01-15 LAB — HEPARIN LEVEL (UNFRACTIONATED): Heparin Unfractionated: 1.01 IU/mL — ABNORMAL HIGH (ref 0.30–0.70)

## 2022-01-15 SURGERY — RIGHT/LEFT HEART CATH AND CORONARY ANGIOGRAPHY
Anesthesia: LOCAL

## 2022-01-15 MED ORDER — SODIUM CHLORIDE 0.9% FLUSH
3.0000 mL | Freq: Two times a day (BID) | INTRAVENOUS | Status: DC
  Administered 2022-01-15 – 2022-01-21 (×11): 3 mL via INTRAVENOUS

## 2022-01-15 MED ORDER — VERAPAMIL HCL 2.5 MG/ML IV SOLN
INTRAVENOUS | Status: AC
  Filled 2022-01-15: qty 2

## 2022-01-15 MED ORDER — SODIUM CHLORIDE 0.9 % WEIGHT BASED INFUSION
3.0000 mL/kg/h | INTRAVENOUS | Status: DC
  Administered 2022-01-15: 3 mL/kg/h via INTRAVENOUS

## 2022-01-15 MED ORDER — SODIUM CHLORIDE 0.9 % IV SOLN: 250.0000 mL | INTRAVENOUS | Status: DC | PRN

## 2022-01-15 MED ORDER — SACUBITRIL-VALSARTAN 24-26 MG PO TABS
1.0000 | ORAL_TABLET | Freq: Two times a day (BID) | ORAL | Status: DC
  Administered 2022-01-15 – 2022-01-19 (×10): 1 via ORAL
  Filled 2022-01-15 (×10): qty 1

## 2022-01-15 MED ORDER — ONDANSETRON HCL 4 MG/2ML IJ SOLN
INTRAMUSCULAR | Status: DC | PRN
  Administered 2022-01-15: 4 mg via INTRAVENOUS

## 2022-01-15 MED ORDER — VERAPAMIL HCL 2.5 MG/ML IV SOLN
INTRAVENOUS | Status: DC | PRN
  Administered 2022-01-15: 10 mL via INTRA_ARTERIAL

## 2022-01-15 MED ORDER — SPIRONOLACTONE 12.5 MG HALF TABLET
12.5000 mg | ORAL_TABLET | Freq: Every day | ORAL | Status: DC
  Administered 2022-01-16 – 2022-01-21 (×6): 12.5 mg via ORAL
  Filled 2022-01-15 (×6): qty 1

## 2022-01-15 MED ORDER — EMPAGLIFLOZIN 10 MG PO TABS
10.0000 mg | ORAL_TABLET | Freq: Every day | ORAL | Status: DC
  Administered 2022-01-16 – 2022-01-21 (×6): 10 mg via ORAL
  Filled 2022-01-15 (×6): qty 1

## 2022-01-15 MED ORDER — FENTANYL CITRATE (PF) 100 MCG/2ML IJ SOLN
INTRAMUSCULAR | Status: DC | PRN
  Administered 2022-01-15: 25 ug via INTRAVENOUS

## 2022-01-15 MED ORDER — FUROSEMIDE 10 MG/ML IJ SOLN
40.0000 mg | Freq: Two times a day (BID) | INTRAMUSCULAR | Status: DC
  Filled 2022-01-15: qty 4

## 2022-01-15 MED ORDER — FENTANYL CITRATE (PF) 100 MCG/2ML IJ SOLN
INTRAMUSCULAR | Status: AC
  Filled 2022-01-15: qty 2

## 2022-01-15 MED ORDER — SODIUM CHLORIDE 0.9 % WEIGHT BASED INFUSION
1.0000 mL/kg/h | INTRAVENOUS | Status: DC
Start: 2022-01-15 — End: 2022-01-15

## 2022-01-15 MED ORDER — LIDOCAINE HCL (PF) 1 % IJ SOLN
INTRAMUSCULAR | Status: DC | PRN
  Administered 2022-01-15 (×2): 2 mL

## 2022-01-15 MED ORDER — IOHEXOL 350 MG/ML SOLN
INTRAVENOUS | Status: DC | PRN
  Administered 2022-01-15: 45 mL

## 2022-01-15 MED ORDER — FUROSEMIDE 10 MG/ML IJ SOLN
60.0000 mg | Freq: Two times a day (BID) | INTRAMUSCULAR | Status: DC
  Administered 2022-01-15 – 2022-01-18 (×6): 60 mg via INTRAVENOUS
  Filled 2022-01-15 (×6): qty 6

## 2022-01-15 MED ORDER — ASPIRIN 81 MG PO CHEW
81.0000 mg | CHEWABLE_TABLET | ORAL | Status: AC
  Administered 2022-01-15: 81 mg via ORAL
  Filled 2022-01-15: qty 1

## 2022-01-15 MED ORDER — MIDAZOLAM HCL 2 MG/2ML IJ SOLN
INTRAMUSCULAR | Status: AC
  Filled 2022-01-15: qty 2

## 2022-01-15 MED ORDER — SODIUM CHLORIDE 0.9 % IV SOLN: INTRAVENOUS | Status: DC

## 2022-01-15 MED ORDER — SODIUM CHLORIDE 0.9% FLUSH: 3.0000 mL | INTRAVENOUS | Status: DC | PRN

## 2022-01-15 MED ORDER — HEPARIN (PORCINE) IN NACL 1000-0.9 UT/500ML-% IV SOLN
INTRAVENOUS | Status: DC | PRN
  Administered 2022-01-15 (×2): 500 mL

## 2022-01-15 MED ORDER — HEPARIN SODIUM (PORCINE) 1000 UNIT/ML IJ SOLN
INTRAMUSCULAR | Status: AC
  Filled 2022-01-15: qty 10

## 2022-01-15 MED ORDER — HEPARIN (PORCINE) IN NACL 1000-0.9 UT/500ML-% IV SOLN
INTRAVENOUS | Status: AC
  Filled 2022-01-15: qty 1000

## 2022-01-15 MED ORDER — MIDAZOLAM HCL 2 MG/2ML IJ SOLN
INTRAMUSCULAR | Status: DC | PRN
  Administered 2022-01-15 (×2): 1 mg via INTRAVENOUS

## 2022-01-15 MED ORDER — LIDOCAINE HCL (PF) 1 % IJ SOLN
INTRAMUSCULAR | Status: AC
  Filled 2022-01-15: qty 30

## 2022-01-15 MED ORDER — CARVEDILOL 3.125 MG PO TABS
3.1250 mg | ORAL_TABLET | Freq: Two times a day (BID) | ORAL | Status: DC
  Administered 2022-01-15 – 2022-01-21 (×12): 3.125 mg via ORAL
  Filled 2022-01-15 (×12): qty 1

## 2022-01-15 MED ORDER — HEPARIN SODIUM (PORCINE) 1000 UNIT/ML IJ SOLN
INTRAMUSCULAR | Status: DC | PRN
  Administered 2022-01-15: 4000 [IU] via INTRAVENOUS

## 2022-01-15 SURGICAL SUPPLY — 14 items
BAND CMPR LRG ZPHR (HEMOSTASIS) ×1
BAND ZEPHYR COMPRESS 30 LONG (HEMOSTASIS) IMPLANT
CATH BALLN WEDGE 5F 110CM (CATHETERS) IMPLANT
CATH INFINITI 5FR JK (CATHETERS) IMPLANT
GLIDESHEATH SLEND SS 6F .021 (SHEATH) IMPLANT
GUIDEWIRE .025 260CM (WIRE) IMPLANT
GUIDEWIRE INQWIRE 1.5J.035X260 (WIRE) IMPLANT
INQWIRE 1.5J .035X260CM (WIRE) ×1
KIT HEART LEFT (KITS) ×1 IMPLANT
PACK CARDIAC CATHETERIZATION (CUSTOM PROCEDURE TRAY) ×1 IMPLANT
SHEATH GLIDE SLENDER 4/5FR (SHEATH) IMPLANT
SHEATH PROBE COVER 6X72 (BAG) IMPLANT
TRANSDUCER W/STOPCOCK (MISCELLANEOUS) ×1 IMPLANT
TUBING CIL FLEX 10 FLL-RA (TUBING) ×1 IMPLANT

## 2022-01-15 NOTE — Progress Notes (Signed)
Patient had right brachial hematoma and mild right radial hematoma.  He also has pulmonary embolism.  Plan to resume heparin this afternoon but reviewed with Dr.Arida who recommended to hold heparin until tomorrow AM.

## 2022-01-15 NOTE — Assessment & Plan Note (Addendum)
Hypomagnesemia, hyponatremia   Volume status is improving Renal function today with serum cr at 1.31, K is 4,5 and serum bicarbonate at 27 Continue close follow up on renal function and electrolytes.   Diuretic therapy with spironolactone and SGLT 2 inh. Continue with entresto for heart failure.

## 2022-01-15 NOTE — Progress Notes (Signed)
Heart Failure Navigator Progress Note  Following this hospitalization to assess for HV TOC readiness.   EF 15-20% R/L Heart cath scheduled for 01/15/22 Plan for [facility] appointment   Earnestine Leys, BSN, RN Heart Failure Nurse Navigator Secure Chat Only

## 2022-01-15 NOTE — Interval H&P Note (Signed)
History and Physical Interval Note:  01/15/2022 10:14 AM  Phillip Rich  has presented today for surgery, with the diagnosis of chest pain.  The various methods of treatment have been discussed with the patient and family. After consideration of risks, benefits and other options for treatment, the patient has consented to  Procedure(s): RIGHT/LEFT HEART CATH AND CORONARY ANGIOGRAPHY (N/A) as a surgical intervention.  The patient's history has been reviewed, patient examined, no change in status, stable for surgery.  I have reviewed the patient's chart and labs.  Questions were answered to the patient's satisfaction.     Kathlyn [place]

## 2022-01-15 NOTE — H&P (View-Only) (Signed)
Rounding Note    Patient Name: Phillip Rich Date of Encounter: 01/15/2022  [place] Cardiologist: Minus Breeding, MD  Subjective   Much improved shortness of breath.  Awaiting heart catheterization.  Frustrated from yesterday being n.p.o. and not having been able to go for heart catheterization.  Apologized for the delay.  There were several emergencies.  Inpatient Medications    Scheduled Meds:  DULoxetine  60 mg Oral Daily   furosemide  40 mg Oral Daily   hydrALAZINE  10 mg Oral Q8H   insulin aspart  0-15 Units Subcutaneous TID WC   insulin aspart  0-5 Units Subcutaneous QHS   insulin aspart  6 Units Subcutaneous TID WC   insulin glargine-yfgn  10 Units Subcutaneous Daily   isosorbide mononitrate  30 mg Oral Daily   pantoprazole  80 mg Oral Daily   simvastatin  20 mg Oral q morning   zolpidem  5 mg Oral QHS   Continuous Infusions:  sodium chloride     heparin 1,350 Units/hr (01/14/22 2002)   PRN Meds: acetaminophen **OR** acetaminophen, albuterol, fluticasone, guaiFENesin-dextromethorphan, hydrALAZINE, ondansetron **OR** ondansetron (ZOFRAN) IV, polyethylene glycol   Vital Signs    Vitals:   01/15/22 0443 01/15/22 0616 01/15/22 0651 01/15/22 0739  BP: 120/88 120/88 120/88 138/87  Pulse: (!) 101  (!) 101 (!) 101  Resp: '19  19 18  '$ Temp: 97.9 F (36.6 C)  97.9 F (36.6 C) (!) 97.4 F (36.3 C)  TempSrc:    Oral  SpO2:    99%  Weight:      Height:        Intake/Output Summary (Last 24 hours) at 01/15/2022 0951 Last data filed at 01/15/2022 0946 Gross per 24 hour  Intake 146.07 ml  Output 300 ml  Net -153.93 ml      01/15/2022    4:36 AM 01/14/2022    5:00 AM 01/13/2022    5:00 AM  Last 3 Weights  Weight (lbs) 195 lb 8 oz 193 lb 11.2 oz 195 lb 5.2 oz  Weight (kg) 88.678 kg 87.862 kg 88.6 kg      Telemetry    Sinus rhythm 90s to 100 with occasional PVCs- Personally Reviewed  ECG    No new Ecg- Personally Reviewed  Physical Exam   GEN:  No acute distress.   Neck: No JVD Cardiac: RRR, no murmurs, rubs, or gallops.  Respiratory: Clear to auscultation bilaterally. GI: Soft, nontender, non-distended  MS: No edema; No deformity. Neuro:  Nonfocal  Psych: Normal affect   Labs    High Sensitivity Troponin:   Recent Labs  Lab 01/05/22 1211 01/09/22 1400 01/09/22 1457  TROPONINIHS 81* 100* 99*     Chemistry Recent Labs  Lab 01/10/22 0526 01/11/22 0456 01/12/22 0534 01/13/22 0136 01/14/22 0424  NA 136   < > 132* 135 137  K 3.9   < > 3.8 3.8 4.5  CL 102   < > 96* 100 96*  CO2 23   < > '26 28 29  '$ GLUCOSE 249*   < > 251* 179* 177*  BUN 45*   < > 41* 39* 37*  CREATININE 1.28*   < > 1.33* 1.19 1.22  CALCIUM 8.8*   < > 8.9 8.8* 9.0  MG  --   --   --  1.6*  --   PROT 6.5  --   --   --   --   ALBUMIN 2.9*  --   --   --   --  AST 75*  --   --   --   --   ALT 172*  --   --   --   --   ALKPHOS 109  --   --   --   --   BILITOT 1.0  --   --   --   --   GFRNONAA 60*   < > 57* >60 >60  ANIONGAP 11   < > '10 7 12   '$ < > = values in this interval not displayed.    Lipids  Recent Labs  Lab 01/10/22 0526  CHOL 120  TRIG 101  HDL 23*  LDLCALC 77  CHOLHDL 5.2    Hematology Recent Labs  Lab 01/12/22 0534 01/13/22 0136 01/14/22 0424  WBC 11.9* 13.1* 10.9*  RBC 3.59* 3.15* 3.46*  HGB 10.8* 9.6* 10.4*  HCT 34.2* 29.7* 33.1*  MCV 95.3 94.3 95.7  MCH 30.1 30.5 30.1  MCHC 31.6 32.3 31.4  RDW 15.7* 15.8* 15.9*  PLT 304 244 258   Thyroid  Recent Labs  Lab 01/09/22 2139  TSH 2.835    BNP Recent Labs  Lab 01/09/22 1400 01/10/22 0526  BNP 2,249.9* 1,972.3*    DDimer No results for input(s): "DDIMER" in the last 168 hours.   Radiology    No results found.  Cardiac Studies     Echo from 01/10/22:   1. Left ventricular ejection fraction, by estimation, is 15-20%. The left  ventricle has severely decreased function. The left ventricle demonstrates  global hypokinesis. The left ventricular internal  cavity size was mildly  dilated. Left ventricular  diastolic parameters are indeterminate.   2. Right ventricular systolic function is moderately reduced. The right  ventricular size is mildly enlarged. There is mildly elevated pulmonary  artery systolic pressure. The estimated right ventricular systolic  pressure is 49.4 mmHg.   3. The mitral valve is grossly normal. Moderate mitral valve  regurgitation - jet of MR is likely eccentric, and may be underestimated.  No evidence of mitral stenosis.   4. Tricuspid valve regurgitation is moderate.   5. The aortic valve is grossly normal. There is mild calcification of the  aortic valve. Aortic valve regurgitation is not visualized. No aortic  stenosis is present.   6. The inferior vena cava is normal in size with <50% respiratory  variability, suggesting right atrial pressure of 8 mmHg.   Conclusion(s)/Recommendation(s): No left ventricular mural or apical  thrombus/thrombi.   Patient Profile     71 y.o. male with newly discovered ejection fraction 15 to 20% recent acute pulmonary embolism on heparin IV  Assessment & Plan    Acute systolic heart failure newly diagnosed with a EF 15 to 20% - Cardiac catheterization right and left.  Risks and benefits have been explained. -Now on p.o. Lasix 40 mg a day. -Add Entresto spironolactone Farxiga post-cath as tolerated.  Acute PE - May be from low flow/decreased cardiac output.  Currently on IV heparin transition to Eliquis post cath.  Diabetes with hypertension - Per primary team.    For questions or updates, please contact [place] Please consult [email or web address] for contact info under        Signed, Candee Furbish, MD  01/15/2022, 9:51 AM

## 2022-01-15 NOTE — Assessment & Plan Note (Addendum)
Hematoma and Hgb are stable, follow up hgb is 11.5   Continue anticoagulation with apixaban.

## 2022-01-15 NOTE — Progress Notes (Signed)
Heart Failure Stewardship Pharmacist Progress Note   PCP: Lajean Manes, MD PCP-Cardiologist: Minus Breeding, MD    HPI:  71 yo M with PMH of T2DM, HTN, BPH, peripheral neuropathy, depression, h/o renal mass s/p partial nephrectomy, and previous tobacco use disorder.  He was last in the ED on 8/27 and diagnosed with new PE, but left AMA before he could be transferred to the floor.   He presented to the ED on 8/31 with shortness of breath, edema, and orthopnea. CXR with bibasilar opacities suggestive of PNA vs edema. An ECHO on 9/1 showed LVEF 15-20%, RV moderately reduced, mildly elevated pulmonary pressures, moderate MR and TR. R/LHC on 9/6 with significant two vessel CAD but cardiomyopathy thought to be out of proportion of CAD. Recommended medical therapy and possible PCI of RCA for refractory anginal symptoms. Volume overloaded with RA 15, PA 38, wedge 29, and CO of 4.5, CI 2.2.  Current HF Medications: Diuretic: furosemide 40 mg IV BID Beta Blocker: carvedilol 3.125 mg BID ACE/ARB/ARNI: Entresto 24/26 mg BID  Prior to admission HF Medications: Diuretic: furosemide 40 mg daily ACE/ARB/ARNI: irbesartan 150 mg daily  Pertinent Lab Values: Serum creatinine 1.22, BUN 37, Potassium 4.5, Sodium 137, BNP 1972.3, Magnesium 1.6 (9/4), A1c 8.3  Vital Signs: Weight: 195 lbs (admission weight: 198 lbs) Blood pressure: 120/80s  Heart rate: 100s  I/O: not well documented yesterday; net -3.8L  Medication Assistance / Insurance Benefits Check: Does the patient have prescription insurance?  Yes Type of insurance plan: UHC Medicare  Does the patient qualify for medication assistance through manufacturers or grants?   Pending Eligible grants and/or patient assistance programs: pending Medication assistance applications in progress: none  Medication assistance applications approved: none Approved medication assistance renewals will be completed by: pending  Outpatient Pharmacy:  Prior to  admission [facility] Is the patient willing to use [facility] at discharge? Yes Is the patient willing to transition their outpatient pharmacy to utilize a [facility] outpatient pharmacy?   Pending    Assessment: 1. Acute systolic CHF (LVEF 62-69%), due to NICM (cardiomyopathy out of proportion to CAD). NYHA class III symptoms. - Agree with starting furosemide 40 mg IV BID with RHC findings - Agree with starting carvedilol 3.125 mg BID - Agree with starting Entresto 24/26 mg BID - Consider starting spironolactone and SGLT2i prior to discharge for GDMT optimization. Watch renal function post cath.    Plan: 1) Medication changes recommended at this time: - Agree with changes as above  2) Patient assistance: - Currently in the donut hole with his insurance - copays during this time are much higher than usual - Will interview the patient to determine if he is eligible for patient assistance  3)  Education  - To be completed prior to discharge  Kerby Nora, PharmD, BCPS Heart Failure Stewardship Pharmacist Phone [phone]

## 2022-01-15 NOTE — Progress Notes (Signed)
Progress Note   Patient: Phillip Rich ZOX:[id number] DOB: Jan 25, 1951 DOA: 01/09/2022     6 DOS: the patient was seen and examined on 01/15/2022   Brief hospital course: Mr. Turko was admitted to the hospital with the working diagnosis of decompensated heart failure.   71 yo male with the past medical history of T2DM, hypertension, sp nephrectomy for a renal mass. Rcent diagnosis of pulmonary embolism, and heart failure on 01/05/22, patient left against medical advice but able to take anticoagulation with apixaban and diuretic therapy with furosemide. At home patient had dyspnea on exertion, lower extremity edema, orthopnea and weight gain 11 lbs over the 7 days prior to admission. On his physical examination in the ED his blood pressure was 133/87, HR 109, RR 15 and 02 saturation 100% on room air. Lungs with decreased breath sounds and diffuse rales bilaterally, with no wheezing, heart with S1 and S2 present and rhythmic, abdomen with no distention, ans positive lower extremity edema.   Na 133, K 4,5 Cl 102 bicarbonate 24, glucose 330 bun 45 cr 1,30  BNP 2,249 High sensitive troponin 100, 99  Wbc 11,7 hgb 11,1 plt 366  Sars covid 19 negative   Chest radiograph with cardiomegaly bilateral hilar vascular congestion with cephalization of the vasculature and bilateral small pleural effusions.   EKG 119 bpm, right axis, normal intervals, sinus rhythm, left atrial enlargement, with no significant ST segment or T wave changes.   Patient was placed on furosemide for diuresis.   Echocardiogram with reduced LV systolic function EF 15 to 20%, with global hypokinesis.  09/05 transferred to [facility] for further ischemic work up with cardiac catheterization.      Assessment and Plan: * Acute systolic (congestive) heart failure (HCC) Echocardiogram with reduced LV systolic function with EF 15 to 20%, global hypokinesis, with mild dilatation of internal cavity. Moderate reduction of RV systolic function  RVSP 81,1. Moderate mitral valve regurgitation. Moderate TR.   09/06 cardiac catheterization with significant 2 vessel disease.  Right heart catheterization with significant elevation in right and left filling pressures. Moderate to severe pulmonary hypertension and moderate reduced cardiac output.  Acute on chronic core pulmonale.  Plan to continue diuresis with furosemide, increased dose to 60 mg IV q12 hrs.  Continue entresto and carvedilol. Add SGLT 2 inh and spironolactone.       Acute pulmonary embolism [facility]) Patient had hematoma at his right upper extremity. Plan to hold on anticoagulation for 24 hrs and follow up Hgb in am. May resume heparin if no worsening hematoma.   Acute renal failure (HCC) Hypomagnesemia  Renal function with serum cr at 1,22, with K at 4,5 and serum bicarbonate at 29,  Plan to continue diuresis with furosemide Add spironolactone and SGLT 2 inh. Follow up renal function in am, patient has been placed on entresto, may rise cr.   HTN (hypertension) Continue blood pressure control with carvedilol and entresto Continue diuresis.   DM2 (diabetes mellitus, type 2) (HCC) Fasting glucose this am 177. Plan to continue insulin sliding scale for glucose cover and monitoring.   Tobacco abuse Continue smoking cessation         Subjective: Patient not feeling well this am, no chest pain but decreases level of energy and generalized weakness,   Physical Exam: Vitals:   01/15/22 1100 01/15/22 1105 01/15/22 1128 01/15/22 2008  BP: 124/82 124/80 110/83 120/80  Pulse: 94 94 93 100  Resp: '16 18 18 20  '$ Temp:    (!) 97.5  F (36.4 C)  TempSrc:    Oral  SpO2: 93% 92%  100%  Weight:      Height:       Neurology awake and alert ENT with mild pallor Cardiovascular with S1 and S2 present and rhythmic with no gallops or rubs Mild JVD Lower extremity edema ++ Respiratory with mild rales at basses.  Abdomen soft and non distended  Data  Reviewed:    Family Communication: no family at the bedside   Disposition: Status is: Inpatient Remains inpatient appropriate because: heart failure and pulmonary embolism,   Planned Discharge Destination: Home    Author: Tawni Millers, MD 01/15/2022 10:29 PM  For on call review [email or web address].

## 2022-01-15 NOTE — Progress Notes (Signed)
Rounding Note    Patient Name: JUEL BELLEROSE Date of Encounter: 01/15/2022  [place] Cardiologist: Minus Breeding, MD  Subjective   Much improved shortness of breath.  Awaiting heart catheterization.  Frustrated from yesterday being n.p.o. and not having been able to go for heart catheterization.  Apologized for the delay.  There were several emergencies.  Inpatient Medications    Scheduled Meds:  DULoxetine  60 mg Oral Daily   furosemide  40 mg Oral Daily   hydrALAZINE  10 mg Oral Q8H   insulin aspart  0-15 Units Subcutaneous TID WC   insulin aspart  0-5 Units Subcutaneous QHS   insulin aspart  6 Units Subcutaneous TID WC   insulin glargine-yfgn  10 Units Subcutaneous Daily   isosorbide mononitrate  30 mg Oral Daily   pantoprazole  80 mg Oral Daily   simvastatin  20 mg Oral q morning   zolpidem  5 mg Oral QHS   Continuous Infusions:  sodium chloride     heparin 1,350 Units/hr (01/14/22 2002)   PRN Meds: acetaminophen **OR** acetaminophen, albuterol, fluticasone, guaiFENesin-dextromethorphan, hydrALAZINE, ondansetron **OR** ondansetron (ZOFRAN) IV, polyethylene glycol   Vital Signs    Vitals:   01/15/22 0443 01/15/22 0616 01/15/22 0651 01/15/22 0739  BP: 120/88 120/88 120/88 138/87  Pulse: (!) 101  (!) 101 (!) 101  Resp: '19  19 18  '$ Temp: 97.9 F (36.6 C)  97.9 F (36.6 C) (!) 97.4 F (36.3 C)  TempSrc:    Oral  SpO2:    99%  Weight:      Height:        Intake/Output Summary (Last 24 hours) at 01/15/2022 0951 Last data filed at 01/15/2022 0946 Gross per 24 hour  Intake 146.07 ml  Output 300 ml  Net -153.93 ml      01/15/2022    4:36 AM 01/14/2022    5:00 AM 01/13/2022    5:00 AM  Last 3 Weights  Weight (lbs) 195 lb 8 oz 193 lb 11.2 oz 195 lb 5.2 oz  Weight (kg) 88.678 kg 87.862 kg 88.6 kg      Telemetry    Sinus rhythm 90s to 100 with occasional PVCs- Personally Reviewed  ECG    No new Ecg- Personally Reviewed  Physical Exam   GEN:  No acute distress.   Neck: No JVD Cardiac: RRR, no murmurs, rubs, or gallops.  Respiratory: Clear to auscultation bilaterally. GI: Soft, nontender, non-distended  MS: No edema; No deformity. Neuro:  Nonfocal  Psych: Normal affect   Labs    High Sensitivity Troponin:   Recent Labs  Lab 01/05/22 1211 01/09/22 1400 01/09/22 1457  TROPONINIHS 81* 100* 99*     Chemistry Recent Labs  Lab 01/10/22 0526 01/11/22 0456 01/12/22 0534 01/13/22 0136 01/14/22 0424  NA 136   < > 132* 135 137  K 3.9   < > 3.8 3.8 4.5  CL 102   < > 96* 100 96*  CO2 23   < > '26 28 29  '$ GLUCOSE 249*   < > 251* 179* 177*  BUN 45*   < > 41* 39* 37*  CREATININE 1.28*   < > 1.33* 1.19 1.22  CALCIUM 8.8*   < > 8.9 8.8* 9.0  MG  --   --   --  1.6*  --   PROT 6.5  --   --   --   --   ALBUMIN 2.9*  --   --   --   --  AST 75*  --   --   --   --   ALT 172*  --   --   --   --   ALKPHOS 109  --   --   --   --   BILITOT 1.0  --   --   --   --   GFRNONAA 60*   < > 57* >60 >60  ANIONGAP 11   < > '10 7 12   '$ < > = values in this interval not displayed.    Lipids  Recent Labs  Lab 01/10/22 0526  CHOL 120  TRIG 101  HDL 23*  LDLCALC 77  CHOLHDL 5.2    Hematology Recent Labs  Lab 01/12/22 0534 01/13/22 0136 01/14/22 0424  WBC 11.9* 13.1* 10.9*  RBC 3.59* 3.15* 3.46*  HGB 10.8* 9.6* 10.4*  HCT 34.2* 29.7* 33.1*  MCV 95.3 94.3 95.7  MCH 30.1 30.5 30.1  MCHC 31.6 32.3 31.4  RDW 15.7* 15.8* 15.9*  PLT 304 244 258   Thyroid  Recent Labs  Lab 01/09/22 2139  TSH 2.835    BNP Recent Labs  Lab 01/09/22 1400 01/10/22 0526  BNP 2,249.9* 1,972.3*    DDimer No results for input(s): "DDIMER" in the last 168 hours.   Radiology    No results found.  Cardiac Studies     Echo from 01/10/22:   1. Left ventricular ejection fraction, by estimation, is 15-20%. The left  ventricle has severely decreased function. The left ventricle demonstrates  global hypokinesis. The left ventricular internal  cavity size was mildly  dilated. Left ventricular  diastolic parameters are indeterminate.   2. Right ventricular systolic function is moderately reduced. The right  ventricular size is mildly enlarged. There is mildly elevated pulmonary  artery systolic pressure. The estimated right ventricular systolic  pressure is 29.9 mmHg.   3. The mitral valve is grossly normal. Moderate mitral valve  regurgitation - jet of MR is likely eccentric, and may be underestimated.  No evidence of mitral stenosis.   4. Tricuspid valve regurgitation is moderate.   5. The aortic valve is grossly normal. There is mild calcification of the  aortic valve. Aortic valve regurgitation is not visualized. No aortic  stenosis is present.   6. The inferior vena cava is normal in size with <50% respiratory  variability, suggesting right atrial pressure of 8 mmHg.   Conclusion(s)/Recommendation(s): No left ventricular mural or apical  thrombus/thrombi.   Patient Profile     71 y.o. male with newly discovered ejection fraction 15 to 20% recent acute pulmonary embolism on heparin IV  Assessment & Plan    Acute systolic heart failure newly diagnosed with a EF 15 to 20% - Cardiac catheterization right and left.  Risks and benefits have been explained. -Now on p.o. Lasix 40 mg a day. -Add Entresto spironolactone Farxiga post-cath as tolerated.  Acute PE - May be from low flow/decreased cardiac output.  Currently on IV heparin transition to Eliquis post cath.  Diabetes with hypertension - Per primary team.    For questions or updates, please contact [place] Please consult [email or web address] for contact info under        Signed, Candee Furbish, MD  01/15/2022, 9:51 AM

## 2022-01-15 NOTE — Progress Notes (Signed)
  Transition of Care [facility]) Screening Note   Patient Details  Name: Phillip Rich Date of Birth: 1951/04/13   Transition of Care [facility]) CM/SW Contact:    Bethann Berkshire, [place] Phone Number: 01/15/2022, 3:07 PM    Transition of Care Department [facility]) has reviewed patient and no TOC needs have been identified at this time. We will continue to monitor patient advancement through interdisciplinary progression rounds. If new patient transition needs arise, please place a TOC consult.

## 2022-01-15 NOTE — Assessment & Plan Note (Addendum)
Continue blood pressure control with carvedilol.  

## 2022-01-15 NOTE — Assessment & Plan Note (Addendum)
Echocardiogram with reduced LV systolic function with EF 15 to 20%, global hypokinesis, with mild dilatation of internal cavity. Moderate reduction of RV systolic function, RVSP 07,3. Moderate mitral valve regurgitation. Moderate TR.   09/06 cardiac catheterization with significant 2 vessel disease.  Right heart catheterization with significant elevation in right and left filling pressures. Moderate to severe pulmonary hypertension and moderate reduced cardiac output.  Acute on chronic core pulmonale.  Patient was placed on furosemide for diuresis, negative fluid balance was achieved, -11,425 ml, with significant improvement in his symptoms.   At the time of his discharge patient will continue taking carvedilol, empagliflozin, spironolactone and losartan Resume diuresis with furosemide 20 mg daily.  Start entresto as outpatient, when renal functio more stable. Follow up with cardiology as outpatient.

## 2022-01-15 NOTE — Assessment & Plan Note (Signed)
Continue smoking cessation  

## 2022-01-15 NOTE — Assessment & Plan Note (Addendum)
Hyperglycemia with capillary glucose up to 249 mg/dl.  Fasting glucose this am 147 Plan to continue insulin sliding scale for glucose cover and monitoring.  Basal insulin 10 units daily and pre meal 6 units of short acting insulin.

## 2022-01-15 NOTE — Hospital Course (Addendum)
Phillip Rich was admitted to the hospital with the working diagnosis of decompensated heart failure, complicated with acute pulmonary embolism.  Prolonged hospitalization due to renal failure.   71 yo male with the past medical history of T2DM, hypertension, sp nephrectomy for a renal mass. Recent diagnosis of pulmonary embolism, and heart failure on 01/05/22, patient left against medical advice but able to take anticoagulation with apixaban and diuretic therapy with furosemide. At home patient had dyspnea on exertion, lower extremity edema, orthopnea and weight gain 11 lbs over the 7 days prior to admission. On his physical examination in the ED his blood pressure was 133/87, HR 109, RR 15 and 02 saturation 100% on room air. Lungs with decreased breath sounds and diffuse rales bilaterally, with no wheezing, heart with S1 and S2 present and rhythmic, abdomen with no distention, positive lower extremity edema.   Na 133, K 4,5 Cl 102 bicarbonate 24, glucose 330 bun 45 cr 1,30  BNP 2,249 High sensitive troponin 100, 99  Wbc 11,7 hgb 11,1 plt 366  Sars covid 19 negative   Chest radiograph with cardiomegaly bilateral hilar vascular congestion with cephalization of the vasculature and bilateral small pleural effusions.   EKG 119 bpm, right axis, normal intervals, sinus rhythm, left atrial enlargement, with no significant ST segment or T wave changes.   Patient was placed on furosemide for diuresis.   Echocardiogram with reduced LV systolic function EF 15 to 20%, with global hypokinesis.  09/05 transferred from [facility] to [facility] for further ischemic work up with cardiac catheterization.   09/06 cardiac catheterization with significant 2 vessel disease.  Right heart catheterization with significant elevation in right and left filling pressures. Moderate to severe pulmonary hypertension and moderate reduced cardiac output. Right arm hematoma post cath and anticoagulation was held. 09/07 volume status is  improving. Hematoma stable, and resumed on anticoagulation with apixaban.  09/08 volume status continue to improve.  09/10 hospitalization has been prolonged due to renal failure  09/12 renal function improving, patient will be discharged home with close follow up as outpatient.

## 2022-01-16 ENCOUNTER — Encounter (HOSPITAL_COMMUNITY): Payer: Self-pay | Admitting: Internal Medicine

## 2022-01-16 DIAGNOSIS — I5021 Acute systolic (congestive) heart failure: Secondary | ICD-10-CM | POA: Diagnosis not present

## 2022-01-16 DIAGNOSIS — I2609 Other pulmonary embolism with acute cor pulmonale: Secondary | ICD-10-CM | POA: Diagnosis not present

## 2022-01-16 DIAGNOSIS — Z720 Tobacco use: Secondary | ICD-10-CM | POA: Diagnosis not present

## 2022-01-16 DIAGNOSIS — I10 Essential (primary) hypertension: Secondary | ICD-10-CM | POA: Diagnosis not present

## 2022-01-16 LAB — POCT I-STAT EG7
Acid-Base Excess: 4 mmol/L — ABNORMAL HIGH (ref 0.0–2.0)
Bicarbonate: 30.1 mmol/L — ABNORMAL HIGH (ref 20.0–28.0)
Calcium, Ion: 1.16 mmol/L (ref 1.15–1.40)
HCT: 31 % — ABNORMAL LOW (ref 39.0–52.0)
Hemoglobin: 10.5 g/dL — ABNORMAL LOW (ref 13.0–17.0)
O2 Saturation: 50 %
Potassium: 4.3 mmol/L (ref 3.5–5.1)
Sodium: 135 mmol/L (ref 135–145)
TCO2: 32 mmol/L (ref 22–32)
pCO2, Ven: 50.4 mmHg (ref 44–60)
pH, Ven: 7.383 (ref 7.25–7.43)
pO2, Ven: 28 mmHg — CL (ref 32–45)

## 2022-01-16 LAB — BASIC METABOLIC PANEL
Anion gap: 9 (ref 5–15)
BUN: 36 mg/dL — ABNORMAL HIGH (ref 8–23)
CO2: 25 mmol/L (ref 22–32)
Calcium: 8.5 mg/dL — ABNORMAL LOW (ref 8.9–10.3)
Chloride: 97 mmol/L — ABNORMAL LOW (ref 98–111)
Creatinine, Ser: 1.53 mg/dL — ABNORMAL HIGH (ref 0.61–1.24)
GFR, Estimated: 48 mL/min — ABNORMAL LOW (ref >60)
Glucose, Bld: 165 mg/dL — ABNORMAL HIGH (ref 70–99)
Potassium: 4 mmol/L (ref 3.5–5.1)
Sodium: 131 mmol/L — ABNORMAL LOW (ref 135–145)

## 2022-01-16 LAB — CBC
HCT: 30 % — ABNORMAL LOW (ref 39.0–52.0)
Hemoglobin: 9.7 g/dL — ABNORMAL LOW (ref 13.0–17.0)
MCH: 29.8 pg (ref 26.0–34.0)
MCHC: 32.3 g/dL (ref 30.0–36.0)
MCV: 92.3 fL (ref 80.0–100.0)
Platelets: 211 10*3/uL (ref 150–400)
RBC: 3.25 MIL/uL — ABNORMAL LOW (ref 4.22–5.81)
RDW: 15.7 % — ABNORMAL HIGH (ref 11.5–15.5)
WBC: 9.6 10*3/uL (ref 4.0–10.5)
nRBC: 0 % (ref 0.0–0.2)

## 2022-01-16 LAB — GLUCOSE, CAPILLARY
Glucose-Capillary: 163 mg/dL — ABNORMAL HIGH (ref 70–99)
Glucose-Capillary: 228 mg/dL — ABNORMAL HIGH (ref 70–99)
Glucose-Capillary: 233 mg/dL — ABNORMAL HIGH (ref 70–99)
Glucose-Capillary: 120 mg/dL — ABNORMAL HIGH (ref 70–99)

## 2022-01-16 MED ORDER — INFLUENZA VAC A&B SA ADJ QUAD 0.5 ML IM PRSY
0.5000 mL | PREFILLED_SYRINGE | INTRAMUSCULAR | Status: AC
  Administered 2022-01-20: 0.5 mL via INTRAMUSCULAR
  Filled 2022-01-16: qty 0.5

## 2022-01-16 MED ORDER — APIXABAN 5 MG PO TABS
5.0000 mg | ORAL_TABLET | Freq: Two times a day (BID) | ORAL | Status: DC
  Administered 2022-01-16 – 2022-01-21 (×11): 5 mg via ORAL
  Filled 2022-01-16 (×11): qty 1

## 2022-01-16 NOTE — Progress Notes (Signed)
Heart Failure Nurse Navigator Progress Note  PCP: Lajean Manes, MD PCP-Cardiologist: H[place]rein Admission Diagnosis: Acute pulmonary embolism, Congestive heart failure.  Admitted from: Home  Presentation:   LYON DUMONT presented with Shortness of breath, bilateral extremity edema, and orthopnea.Was diagnosed 4 days prior on 01/05/22 with volume overload and pulmonary embolisms,was started on blood thinners, left AMA,  Patient back at hospital with continue dyspnea. BP 135/98, HR 112, BNP 2,249, IV lasix given.  Echo on 9/1 showed EF 15-20%, moderate MR and TR, CXR showed bibasilar opacities, ? PNA vs edema. R/L [facility] 9/6 with two vessel CAD, medical management recommended.  Patient was educated on the sign and symptoms of heart failure, daily weights, when to call his doctor or go to the ER, Diet/ fluid restrictions ( patient states he does use salt, and drinks multiple dark colas a day. ) Education was done on taking all medications as prescribed and attending all medical appointments. Patient stated he does smoke marijuana at least 3 times per week to help him sleep, he stated he will probably switch to gummies instead. Patient asked a number of questions and voiced his understanding of education. He was scheduled a hospital HF TOC appointment on 01/27/2022 @ 2 pm.   ECHO/ LVEF: 15-20%   Clinical Course:  Past Medical History:  Diagnosis Date   Anxiety    BPH (benign prostatic hyperplasia)    Complication of anesthesia    Diabetes mellitus    DJD (degenerative joint disease)    GERD (gastroesophageal reflux disease)    HTN (hypertension)    Hyperlipidemia    ILD (interstitial lung disease) (HCC)    Left renal mass    Melanoma (HCC)    Mild sleep apnea    Sleep study pending   PONV (postoperative nausea and vomiting)      Social History   Socioeconomic History   Marital status: Single    Spouse name: Not on file   Number of children: 0   Years of education: Not on file    Highest education level: High school graduate  Occupational History   Occupation: retired  Tobacco Use   Smoking status: Former    Packs/day: 1.00    Years: 50.00    Total pack years: 50.00    Types: Cigarettes    Quit date: 10/16/2021    Years since quitting: 0.2   Smokeless tobacco: Never  Vaping Use   Vaping Use: Never used  Substance and Sexual Activity   Alcohol use: Yes    Comment: rare occ.   Drug use: Yes    Types: Marijuana    Comment: 07/29/21 3 x week to help sleep   Sexual activity: Not on file  Other Topics Concern   Not on file  Social History Narrative   Lives alone.  Divorced.  Retired Sport and exercise psychologist.       Social Determinants of Health   Financial Resource Strain: Medium Risk (01/16/2022)   Overall Financial Resource Strain (CARDIA)    Difficulty of Paying Living Expenses: Somewhat hard  Food Insecurity: No Food Insecurity (01/16/2022)   Hunger Vital Sign    Worried About Running Out of Food in the Last Year: Never true    Ran Out of Food in the Last Year: Never true  Transportation Needs: No Transportation Needs (01/16/2022)   PRAPARE - Hydrologist (Medical): No    Lack of Transportation (Non-Medical): No  Physical Activity: Not on file  Stress: Not  on file  Social Connections: Not on file   Education Assessment and Provision:  Detailed education and instructions provided on heart failure disease management including the following:  Signs and symptoms of Heart Failure When to call the physician Importance of daily weights Low sodium diet Fluid restriction Medication management Anticipated future follow-up appointments  Patient education given on each of the above topics.  Patient acknowledges understanding via teach back method and acceptance of all instructions.  Education Materials:  "Living Better With Heart Failure" Booklet, HF zone tool, & Daily Weight Tracker Tool.  Patient has scale at home: yes Patient  has pill box at home: yes    High Risk Criteria for Readmission and/or Poor Patient Outcomes: Heart failure hospital admissions (last 6 months): 1  No Show rate: 0 Difficult social situation: No Demonstrates medication adherence: Yes Primary Language: English Literacy level: Reading, writing, and comprehension.   Barriers of Care:   Medication ( in a doughnut hole with costs) Diet/ fluid restrictions ( salt and cola's)  Daily weights.   Considerations/Referrals:   Referral made to Heart Failure Pharmacist Stewardship: Yes Referral made to Heart Failure CSW/NCM TOC: No Referral made to Heart & Vascular TOC clinic: Yes 01/27/22 @ 2 pm.   Items for Follow-up on DC/TOC: Doughnut hole with medications Diet/ fluid restrictions ( salt/ cola's) Daily weights Changing from smoking marijuana to gummies.    Earnestine Leys, BSN, Clinical cytogeneticist Only

## 2022-01-16 NOTE — Plan of Care (Signed)
  Problem: Education: Goal: Ability to demonstrate management of disease process will improve Outcome: Progressing   Problem: Education: Goal: Ability to verbalize understanding of medication therapies will improve Outcome: Progressing   Problem: Cardiac: Goal: Ability to achieve and maintain adequate cardiopulmonary perfusion will improve Outcome: Progressing   Problem: Health Behavior/Discharge Planning: Goal: Ability to identify and utilize available resources and services will improve Outcome: Progressing Goal: Ability to manage health-related needs will improve Outcome: Progressing   Problem: Skin Integrity: Goal: Risk for impaired skin integrity will decrease Outcome: Progressing   Problem: Safety: Goal: Ability to remain free from injury will improve Outcome: Progressing   Problem: Pain Managment: Goal: General experience of comfort will improve Outcome: Progressing   Problem: Cardiovascular: Goal: Ability to achieve and maintain adequate cardiovascular perfusion will improve Outcome: Progressing Goal: Vascular access site(s) Level 0-1 will be maintained Outcome: Progressing

## 2022-01-16 NOTE — Progress Notes (Addendum)
Progress Note   Patient: Phillip Rich AVW:[id number] DOB: 1950/10/29 DOA: 01/09/2022     7 DOS: the patient was seen and examined on 01/16/2022   Brief hospital course: [name] was admitted to the hospital with the working diagnosis of decompensated heart failure, complicated with acute pulmonary embolism.   71 yo male with the past medical history of T2DM, hypertension, sp nephrectomy for a renal mass. Rcent diagnosis of pulmonary embolism, and heart failure on 01/05/22, patient left against medical advice but able to take anticoagulation with apixaban and diuretic therapy with furosemide. At home patient had dyspnea on exertion, lower extremity edema, orthopnea and weight gain 11 lbs over the 7 days prior to admission. On his physical examination in the ED his blood pressure was 133/87, HR 109, RR 15 and 02 saturation 100% on room air. Lungs with decreased breath sounds and diffuse rales bilaterally, with no wheezing, heart with S1 and S2 present and rhythmic, abdomen with no distention, ans positive lower extremity edema.   Na 133, K 4,5 Cl 102 bicarbonate 24, glucose 330 bun 45 cr 1,30  BNP 2,249 High sensitive troponin 100, 99  Wbc 11,7 hgb 11,1 plt 366  Sars covid 19 negative   Chest radiograph with cardiomegaly bilateral hilar vascular congestion with cephalization of the vasculature and bilateral small pleural effusions.   EKG 119 bpm, right axis, normal intervals, sinus rhythm, left atrial enlargement, with no significant ST segment or T wave changes.   Patient was placed on furosemide for diuresis.   Echocardiogram with reduced LV systolic function EF 15 to 20%, with global hypokinesis.  09/05 transferred to [facility] for further ischemic work up with cardiac catheterization.   09/06 cardiac catheterization with significant 2 vessel disease.  Right heart catheterization with significant elevation in right and left filling pressures. Moderate to severe pulmonary hypertension and  moderate reduced cardiac output. Right arm hematoma post cath and anticoagulation was held. 09/07 volume status is improving. Hematoma stable, and resumed on anticoagulation with apixaban.   Assessment and Plan: * Acute systolic (congestive) heart failure (HCC) Echocardiogram with reduced LV systolic function with EF 15 to 20%, global hypokinesis, with mild dilatation of internal cavity. Moderate reduction of RV systolic function RVSP 53,7. Moderate mitral valve regurgitation. Moderate TR.   09/06 cardiac catheterization with significant 2 vessel disease.  Right heart catheterization with significant elevation in right and left filling pressures. Moderate to severe pulmonary hypertension and moderate reduced cardiac output.  Acute on chronic core pulmonale.  Urine output is 4,827 ml Systolic blood pressure 98 to 107 mmHg.   Continue medical therapy with carvedilol, entresto, Diuresis with empagliflozin, spironolaactone and furosemide.   Acute pulmonary embolism (HCC) Hematoma and Hgb are stable Plan to resume anticoagulation with apixaban Follow up h&H in in am.   Acute renal failure (HCC) Hypomagnesemia, hyponatremia   Renal function with serum cr at 1,5, K is 4,0 and serum bicarbonate at 25. Na 131.    Diuretic therapy with spironolactone and SGLT 2 inh. On entresto for heart failure.   HTN (hypertension) Continue blood pressure control with carvedilol and entresto Continue diuresis.   DM2 (diabetes mellitus, type 2) (HCC) Fasting glucose this am 165 Plan to continue insulin sliding scale for glucose cover and monitoring.  Basal insulin 10 units daily and pre meal 6 units of short acting insulin.   Tobacco abuse Continue smoking cessation         Subjective: Patient is feeling better than yesterday, dyspnea has improved,  no chest pain   Physical Exam: Vitals:   01/16/22 0439 01/16/22 0500 01/16/22 0740 01/16/22 1219  BP:   (!) 107/59 113/73  Pulse: 87  90 95   Resp:      Temp:   98.1 F (36.7 C) 97.9 F (36.6 C)  TempSrc:   Oral Oral  SpO2: 98%  98% 98%  Weight:  89 kg    Height:       Neurology awake and alert ENT with no pallor Cardiovascular with S1 and S2 present and rhythmic with no gallops No JVD No lower extremity edema Respiratory with bilateral rales at bases Abdomen with no distention  Data Reviewed: {Tip this will not be part of the note when signed- Document your independent interpretation of telemetry tracing, EKG, lab, Radiology test or any other diagnostic tests. Add any new diagnostic test ordered today.   Family Communication: no family at the bedside   Disposition: Status is: Inpatient Remains inpatient appropriate because: heart failure   Planned Discharge Destination: Home     Author: Tawni Millers, MD 01/16/2022 12:21 PM  For on call review [email or web address].

## 2022-01-16 NOTE — Progress Notes (Signed)
Heart Failure Stewardship Pharmacist Progress Note   PCP: Lajean Manes, MD PCP-Cardiologist: Minus Breeding, MD    HPI:  71 yo M with PMH of T2DM, HTN, BPH, peripheral neuropathy, depression, h/o renal mass s/p partial nephrectomy, and previous tobacco use disorder.  He was last in the ED on 8/27 and diagnosed with new PE, but left AMA before he could be transferred to the floor.   He presented to the ED on 8/31 with shortness of breath, edema, and orthopnea. CXR with bibasilar opacities suggestive of PNA vs edema. An ECHO on 9/1 showed LVEF 15-20%, RV moderately reduced, mildly elevated pulmonary pressures, moderate MR and TR. R/LHC on 9/6 with significant two vessel CAD but cardiomyopathy thought to be out of proportion of CAD. Recommended medical therapy and possible PCI of RCA for refractory anginal symptoms. Volume overloaded with RA 15, PA 38, wedge 29, and CO of 4.5, CI 2.2.  Current HF Medications: Diuretic: furosemide 60 mg IV BID Beta Blocker: carvedilol 3.125 mg BID ACE/ARB/ARNI: Entresto 24/26 mg BID MRA: spironolactone 12.5 mg daily SGLT2i: Jardiance 10 mg daily  Prior to admission HF Medications: Diuretic: furosemide 40 mg daily ACE/ARB/ARNI: irbesartan 150 mg daily  Pertinent Lab Values: Serum creatinine 1.53, BUN 36, Potassium 4.0, Sodium 131, BNP 1972.3, A1c 8.3  Vital Signs: Weight: 196 lbs (admission weight: 198 lbs) Blood pressure: 110/70s  Heart rate: 90s  I/O: -1.1L yesterday; net -6.1L  Medication Assistance / Insurance Benefits Check: Does the patient have prescription insurance?  Yes Type of insurance plan: UHC Medicare  Does the patient qualify for medication assistance through manufacturers or grants?   Pending Eligible grants and/or patient assistance programs: pending Medication assistance applications in progress: none  Medication assistance applications approved: none Approved medication assistance renewals will be completed by:  pending  Outpatient Pharmacy:  Prior to admission outpatient pharmacy: [facility] Is the patient willing to use [facility] at discharge? Yes Is the patient willing to transition their outpatient pharmacy to utilize a [facility] outpatient pharmacy?   Pending    Assessment: 1. Acute systolic CHF (LVEF 72-62%), due to NICM (cardiomyopathy out of proportion to CAD). NYHA class III symptoms. - Agree with increasing to furosemide 60 mg IV BID. Strict I/Os. Daily weights. Keep K>4 and Mag>2 - Continue carvedilol 3.125 mg BID - Continue Entresto 24/26 mg BID - Agree with starting starting spironolactone 12.5 mg daily - Agree with starting Jardiance 10 mg daily    Plan: 1) Medication changes recommended at this time: - Agree with changes as above  2) Patient assistance: - Currently in the donut hole with his insurance - copays during this time are much higher than usual - Will interview the patient tomorrow AM to determine if he is eligible for patient assistance  3)  Education  - To be completed prior to discharge  Kerby Nora, PharmD, BCPS Heart Failure Cytogeneticist Phone [phone]

## 2022-01-16 NOTE — Progress Notes (Signed)
Rounding Note    Patient Name: Phillip Rich Date of Encounter: 01/16/2022  [place] Cardiologist: Minus Breeding, MD  Subjective   Had left heart catheterization.  LVEDP was elevated.  Still overloaded.  Low-dose Entresto spironolactone begun.  Antecubital hematoma noted on forearm radial cath site.  Stable.  Heparin was held.  I would be comfortable starting Eliquis for further treatment of PE.  Feels much better today after further diuresis.  Inpatient Medications    Scheduled Meds:  carvedilol  3.125 mg Oral BID WC   DULoxetine  60 mg Oral Daily   empagliflozin  10 mg Oral Daily   furosemide  60 mg Intravenous Q12H   insulin aspart  0-15 Units Subcutaneous TID WC   insulin aspart  0-5 Units Subcutaneous QHS   insulin aspart  6 Units Subcutaneous TID WC   insulin glargine-yfgn  10 Units Subcutaneous Daily   pantoprazole  80 mg Oral Daily   sacubitril-valsartan  1 tablet Oral BID   simvastatin  20 mg Oral q morning   sodium chloride flush  3 mL Intravenous Q12H   spironolactone  12.5 mg Oral Daily   zolpidem  5 mg Oral QHS   Continuous Infusions:  sodium chloride     sodium chloride     PRN Meds: sodium chloride, acetaminophen **OR** acetaminophen, albuterol, fluticasone, guaiFENesin-dextromethorphan, hydrALAZINE, ondansetron **OR** ondansetron (ZOFRAN) IV, polyethylene glycol, sodium chloride flush   Vital Signs    Vitals:   01/16/22 0437 01/16/22 0439 01/16/22 0500 01/16/22 0740  BP: 98/64   (!) 107/59  Pulse:  87  90  Resp: 18     Temp: (!) 97.4 F (36.3 C)   98.1 F (36.7 C)  TempSrc: Oral   Oral  SpO2: 94% 98%  98%  Weight:   89 kg   Height:        Intake/Output Summary (Last 24 hours) at 01/16/2022 0910 Last data filed at 01/16/2022 3300 Gross per 24 hour  Intake 480 ml  Output 2600 ml  Net -2120 ml      01/16/2022    5:00 AM 01/15/2022    4:36 AM 01/14/2022    5:00 AM  Last 3 Weights  Weight (lbs) 196 lb 4.8 oz 195 lb 8 oz 193 lb  11.2 oz  Weight (kg) 89.041 kg 88.678 kg 87.862 kg      Telemetry    Sinus rhythm 90s to 100 with occasional PVCs- Personally Reviewed  ECG    No new Ecg- Personally Reviewed  Physical Exam   GEN: No acute distress.   Neck: No JVD Cardiac: RRR, no murmurs, rubs, or gallops.  Respiratory: Clear to auscultation bilaterally. GI: Soft, nontender, non-distended  MS: No edema; No deformity.  Right forearm ecchymosis noted Neuro:  Nonfocal  Psych: Normal affect   Labs    High Sensitivity Troponin:   Recent Labs  Lab 01/05/22 1211 01/09/22 1400 01/09/22 1457  TROPONINIHS 81* 100* 99*     Chemistry Recent Labs  Lab 01/10/22 0526 01/11/22 0456 01/13/22 0136 01/14/22 0424 01/15/22 0832 01/16/22 0330  NA 136   < > 135 137 136 131*  K 3.9   < > 3.8 4.5 4.7 4.0  CL 102   < > 100 96* 98 97*  CO2 23   < > '28 29 28 25  '$ GLUCOSE 249*   < > 179* 177* 220* 165*  BUN 45*   < > 39* 37* 33* 36*  CREATININE 1.28*   < >  1.19 1.22 1.42* 1.53*  CALCIUM 8.8*   < > 8.8* 9.0 9.1 8.5*  MG  --   --  1.6*  --   --   --   PROT 6.5  --   --   --   --   --   ALBUMIN 2.9*  --   --   --   --   --   AST 75*  --   --   --   --   --   ALT 172*  --   --   --   --   --   ALKPHOS 109  --   --   --   --   --   BILITOT 1.0  --   --   --   --   --   GFRNONAA 60*   < > >60 >60 53* 48*  ANIONGAP 11   < > '7 12 10 9   '$ < > = values in this interval not displayed.    Lipids  Recent Labs  Lab 01/10/22 0526  CHOL 120  TRIG 101  HDL 23*  LDLCALC 77  CHOLHDL 5.2    Hematology Recent Labs  Lab 01/14/22 0424 01/15/22 0832 01/16/22 0330  WBC 10.9* 11.3* 9.6  RBC 3.46* 3.57* 3.25*  HGB 10.4* 10.8* 9.7*  HCT 33.1* 33.3* 30.0*  MCV 95.7 93.3 92.3  MCH 30.1 30.3 29.8  MCHC 31.4 32.4 32.3  RDW 15.9* 15.8* 15.7*  PLT 258 255 211   Thyroid  Recent Labs  Lab 01/09/22 2139  TSH 2.835    BNP Recent Labs  Lab 01/09/22 1400 01/10/22 0526  BNP 2,249.9* 1,972.3*    DDimer No results for  input(s): "DDIMER" in the last 168 hours.   Radiology    CARDIAC CATHETERIZATION  Result Date: 01/15/2022   Ost LM lesion is 20% stenosed.   Prox Cx to Dist Cx lesion is 70% stenosed.   Prox LAD to Mid LAD lesion is 30% stenosed.   Mid RCA lesion is 100% stenosed.   Prox RCA lesion is 70% stenosed. 1.  Significant two-vessel coronary artery disease with chronically occluded mid right coronary artery with left to right as well as bridging collaterals.  The left circumflex is tortuous and diffusely diseased in the midsegment. 2.  Left ventricular angiography was not performed.  EF was severely reduced by echo. 3.  Right heart catheterization showed significantly elevated right and left-sided filling pressures, moderate to severe pulmonary hypertension and moderately reduced cardiac output. Recommendations: The patient's cardiomyopathy seems to be out of proportion to his coronary artery disease.  PCI of the RCA should be reserved for refractory anginal symptoms.  Recommend medical therapy for now. The still significantly volume overloaded.  I switched furosemide to intravenous 40 mg twice daily and added small dose Entresto and small dose carvedilol.    Cardiac Studies     Echo from 01/10/22:   1. Left ventricular ejection fraction, by estimation, is 15-20%. The left  ventricle has severely decreased function. The left ventricle demonstrates  global hypokinesis. The left ventricular internal cavity size was mildly  dilated. Left ventricular  diastolic parameters are indeterminate.   2. Right ventricular systolic function is moderately reduced. The right  ventricular size is mildly enlarged. There is mildly elevated pulmonary  artery systolic pressure. The estimated right ventricular systolic  pressure is 83.4 mmHg.   3. The mitral valve is grossly normal. Moderate mitral valve  regurgitation - jet of  MR is likely eccentric, and may be underestimated.  No evidence of mitral stenosis.   4.  Tricuspid valve regurgitation is moderate.   5. The aortic valve is grossly normal. There is mild calcification of the  aortic valve. Aortic valve regurgitation is not visualized. No aortic  stenosis is present.   6. The inferior vena cava is normal in size with <50% respiratory  variability, suggesting right atrial pressure of 8 mmHg.   Conclusion(s)/Recommendation(s): No left ventricular mural or apical  thrombus/thrombi.   Cath  1.  Significant two-vessel coronary artery disease with chronically occluded mid right coronary artery with left to right as well as bridging collaterals.  The left circumflex is tortuous and diffusely diseased in the midsegment. 2.  Left ventricular angiography was not performed.  EF was severely reduced by echo. 3.  Right heart catheterization showed significantly elevated right and left-sided filling pressures, moderate to severe pulmonary hypertension and moderately reduced cardiac output.   Recommendations: The patient's cardiomyopathy seems to be out of proportion to his coronary artery disease.  PCI of the RCA should be reserved for refractory anginal symptoms.  Recommend medical therapy for now. The still significantly volume overloaded.  I switched furosemide to intravenous 40 mg twice daily and added small dose Entresto and small dose carvedilol.  Patient Profile     71 y.o. male with newly discovered ejection fraction 15 to 20% recent acute pulmonary embolism on heparin IV  Assessment & Plan    Acute systolic heart failure newly diagnosed with a EF 15 to 20% -Cardiomyopathy out of proportion to coronary artery disease as described above.  PCI should be reserved to RCA for refractory anginal symptoms.  Now on Entresto, continue with IV Lasix for further diuresis.  Watch creatinine.  It is 1.53 today, was 1.22 on the fifth.  Started small dose carvedilol as well as spironolactone.  BP soft.  Feels better overall.  Continue with diuresis again today.   Check creatinine again tomorrow.  Acute PE - I would be comfortable with starting Eliquis for PE treatment.  Anticoagulation was held yesterday because of right forearm hematoma.  Hemoglobin stable 9.7.  Hematoma right forearm noted.  It is marked.  Watch for any signs of worsening.  Diabetes with hypertension - Per primary team.    For questions or updates, please contact [place] Please consult [email or web address] for contact info under        Signed, Candee Furbish, MD  01/16/2022, 9:10 AM

## 2022-01-16 NOTE — Progress Notes (Signed)
   01/16/22 1648  Mobility  Activity Ambulated independently in hallway  Level of Assistance Independent  Assistive Device None  Distance Ambulated (ft) 500 ft  Activity Response Tolerated well  $Mobility charge 1 Mobility   Mobility Specialist Progress Note  PT was in chair and agreeable. X1 standing break d/t fatigue. Returned to chair with all needs met and call bell in reach.   Lucious Groves Mobility Specialist

## 2022-01-16 NOTE — Plan of Care (Signed)
  Problem: Education: Goal: Ability to demonstrate management of disease process will improve Outcome: Progressing Goal: Ability to verbalize understanding of medication therapies will improve Outcome: Progressing Goal: Individualized Educational Video(s) Outcome: Progressing   Problem: Activity: Goal: Capacity to carry out activities will improve Outcome: Progressing   Problem: Cardiac: Goal: Ability to achieve and maintain adequate cardiopulmonary perfusion will improve Outcome: Progressing   Problem: Education: Goal: Ability to describe self-care measures that may prevent or decrease complications (Diabetes Survival Skills Education) will improve Outcome: Progressing Goal: Individualized Educational Video(s) Outcome: Progressing   Problem: Coping: Goal: Ability to adjust to condition or change in health will improve Outcome: Progressing   Problem: Fluid Volume: Goal: Ability to maintain a balanced intake and output will improve Outcome: Progressing   Problem: Health Behavior/Discharge Planning: Goal: Ability to identify and utilize available resources and services will improve Outcome: Progressing Goal: Ability to manage health-related needs will improve Outcome: Progressing   Problem: Metabolic: Goal: Ability to maintain appropriate glucose levels will improve Outcome: Progressing   Problem: Nutritional: Goal: Maintenance of adequate nutrition will improve Outcome: Progressing Goal: Progress toward achieving an optimal weight will improve Outcome: Progressing   Problem: Skin Integrity: Goal: Risk for impaired skin integrity will decrease Outcome: Progressing   Problem: Tissue Perfusion: Goal: Adequacy of tissue perfusion will improve Outcome: Progressing   Problem: Education: Goal: Knowledge of General Education information will improve Description: Including pain rating scale, medication(s)/side effects and non-pharmacologic comfort measures Outcome:  Progressing   Problem: Health Behavior/Discharge Planning: Goal: Ability to manage health-related needs will improve Outcome: Progressing   Problem: Clinical Measurements: Goal: Ability to maintain clinical measurements within normal limits will improve Outcome: Progressing Goal: Will remain free from infection Outcome: Progressing Goal: Diagnostic test results will improve Outcome: Progressing Goal: Respiratory complications will improve Outcome: Progressing Goal: Cardiovascular complication will be avoided Outcome: Progressing   Problem: Activity: Goal: Risk for activity intolerance will decrease Outcome: Progressing   Problem: Nutrition: Goal: Adequate nutrition will be maintained Outcome: Progressing   Problem: Coping: Goal: Level of anxiety will decrease Outcome: Progressing   Problem: Elimination: Goal: Will not experience complications related to bowel motility Outcome: Progressing Goal: Will not experience complications related to urinary retention Outcome: Progressing   Problem: Pain Managment: Goal: General experience of comfort will improve Outcome: Progressing   Problem: Safety: Goal: Ability to remain free from injury will improve Outcome: Progressing   Problem: Skin Integrity: Goal: Risk for impaired skin integrity will decrease Outcome: Progressing   Problem: Education: Goal: Understanding of CV disease, CV risk reduction, and recovery process will improve Outcome: Progressing Goal: Individualized Educational Video(s) Outcome: Progressing   Problem: Activity: Goal: Ability to return to baseline activity level will improve Outcome: Progressing   Problem: Cardiovascular: Goal: Ability to achieve and maintain adequate cardiovascular perfusion will improve Outcome: Progressing Goal: Vascular access site(s) Level 0-1 will be maintained Outcome: Progressing   Problem: Health Behavior/Discharge Planning: Goal: Ability to safely manage  health-related needs after discharge will improve Outcome: Progressing   

## 2022-01-16 NOTE — Progress Notes (Signed)
   01/16/22 1104  Mobility  Activity Ambulated independently in hallway  Level of Assistance Independent  Assistive Device None  Distance Ambulated (ft) 550 ft  Activity Response Tolerated well  $Mobility charge 1 Mobility   Mobility Specialist Progress Note  Received pt in bed having no complaints and agreeable to mobility. Pt was asymptomatic throughout ambulation and returned to room w/o fault. Left EOB w/ call bell in reach and all needs met.   Lucious Groves Mobility Specialist

## 2022-01-17 ENCOUNTER — Other Ambulatory Visit (HOSPITAL_COMMUNITY): Payer: Self-pay

## 2022-01-17 DIAGNOSIS — I10 Essential (primary) hypertension: Secondary | ICD-10-CM | POA: Diagnosis not present

## 2022-01-17 DIAGNOSIS — I5021 Acute systolic (congestive) heart failure: Secondary | ICD-10-CM | POA: Diagnosis not present

## 2022-01-17 DIAGNOSIS — I2510 Atherosclerotic heart disease of native coronary artery without angina pectoris: Secondary | ICD-10-CM

## 2022-01-17 DIAGNOSIS — Z720 Tobacco use: Secondary | ICD-10-CM | POA: Diagnosis not present

## 2022-01-17 DIAGNOSIS — I2609 Other pulmonary embolism with acute cor pulmonale: Secondary | ICD-10-CM | POA: Diagnosis not present

## 2022-01-17 LAB — GLUCOSE, CAPILLARY
Glucose-Capillary: 157 mg/dL — ABNORMAL HIGH (ref 70–99)
Glucose-Capillary: 249 mg/dL — ABNORMAL HIGH (ref 70–99)
Glucose-Capillary: 178 mg/dL — ABNORMAL HIGH (ref 70–99)
Glucose-Capillary: 216 mg/dL — ABNORMAL HIGH (ref 70–99)

## 2022-01-17 LAB — BASIC METABOLIC PANEL
Anion gap: 13 (ref 5–15)
BUN: 33 mg/dL — ABNORMAL HIGH (ref 8–23)
CO2: 27 mmol/L (ref 22–32)
Calcium: 9 mg/dL (ref 8.9–10.3)
Chloride: 97 mmol/L — ABNORMAL LOW (ref 98–111)
Creatinine, Ser: 1.31 mg/dL — ABNORMAL HIGH (ref 0.61–1.24)
GFR, Estimated: 58 mL/min — ABNORMAL LOW (ref >60)
Glucose, Bld: 147 mg/dL — ABNORMAL HIGH (ref 70–99)
Potassium: 4.5 mmol/L (ref 3.5–5.1)
Sodium: 137 mmol/L (ref 135–145)

## 2022-01-17 LAB — MAGNESIUM: Magnesium: 2 mg/dL (ref 1.7–2.4)

## 2022-01-17 LAB — HEMOGLOBIN AND HEMATOCRIT, BLOOD
HCT: 34.8 % — ABNORMAL LOW (ref 39.0–52.0)
Hemoglobin: 11.5 g/dL — ABNORMAL LOW (ref 13.0–17.0)

## 2022-01-17 MED ORDER — ATORVASTATIN CALCIUM 80 MG PO TABS
80.0000 mg | ORAL_TABLET | Freq: Every day | ORAL | Status: DC
  Administered 2022-01-17 – 2022-01-21 (×5): 80 mg via ORAL
  Filled 2022-01-17 (×5): qty 1

## 2022-01-17 MED ORDER — SPIRONOLACTONE 25 MG PO TABS
12.5000 mg | ORAL_TABLET | Freq: Every day | ORAL | 0 refills | Status: DC
  Filled 2022-01-17: qty 15, 30d supply, fill #0

## 2022-01-17 MED ORDER — EMPAGLIFLOZIN 10 MG PO TABS
10.0000 mg | ORAL_TABLET | Freq: Every day | ORAL | 0 refills | Status: DC
  Filled 2022-01-17: qty 30, 30d supply, fill #0

## 2022-01-17 MED ORDER — SACUBITRIL-VALSARTAN 24-26 MG PO TABS
1.0000 | ORAL_TABLET | Freq: Two times a day (BID) | ORAL | 0 refills | Status: DC
  Filled 2022-01-17: qty 60, 30d supply, fill #0

## 2022-01-17 MED ORDER — CARVEDILOL 3.125 MG PO TABS
3.1250 mg | ORAL_TABLET | Freq: Two times a day (BID) | ORAL | 0 refills | Status: DC
  Filled 2022-01-17: qty 60, 30d supply, fill #0

## 2022-01-17 MED ORDER — FUROSEMIDE 20 MG PO TABS
40.0000 mg | ORAL_TABLET | Freq: Every day | ORAL | 0 refills | Status: DC
  Filled 2022-01-17: qty 30, 15d supply, fill #0

## 2022-01-17 MED ORDER — ATORVASTATIN CALCIUM 80 MG PO TABS
80.0000 mg | ORAL_TABLET | Freq: Every day | ORAL | 0 refills | Status: DC
  Filled 2022-01-17: qty 30, 30d supply, fill #0

## 2022-01-17 MED ORDER — APIXABAN 5 MG PO TABS
5.0000 mg | ORAL_TABLET | Freq: Two times a day (BID) | ORAL | 0 refills | Status: DC
  Filled 2022-01-17: qty 60, 30d supply, fill #0

## 2022-01-17 NOTE — Progress Notes (Signed)
   01/17/22 1600  Mobility  Activity Ambulated independently in hallway  Level of Assistance Independent  Assistive Device None  Distance Ambulated (ft) 550 ft  Activity Response Tolerated well  $Mobility charge 1 Mobility   Mobility Specialist Progress Note  Received pt in Chair  having no complaints and agreeable to mobility. Pt was asymptomatic throughout ambulation and returned to room w/o fault. Left in chair w/ call bell in reach and all needs met.   Lucious Groves Mobility Specialist

## 2022-01-17 NOTE — Progress Notes (Signed)
Heart Failure Stewardship Pharmacist Progress Note   PCP: Lajean Manes, MD PCP-Cardiologist: Minus Breeding, MD    HPI:  71 yo M with PMH of T2DM, HTN, BPH, peripheral neuropathy, depression, h/o renal mass s/p partial nephrectomy, and previous tobacco use disorder.  He was last in the ED on 8/27 and diagnosed with new PE, but left AMA before he could be transferred to the floor.   He presented to the ED on 8/31 with shortness of breath, edema, and orthopnea. CXR with bibasilar opacities suggestive of PNA vs edema. An ECHO on 9/1 showed LVEF 15-20%, RV moderately reduced, mildly elevated pulmonary pressures, moderate MR and TR. R/LHC on 9/6 with significant two vessel CAD but cardiomyopathy thought to be out of proportion of CAD. Recommended medical therapy and possible PCI of RCA for refractory anginal symptoms. Volume overloaded with RA 15, PA 38, wedge 29, and CO of 4.5, CI 2.2.  Current HF Medications: Diuretic: furosemide 60 mg IV BID Beta Blocker: carvedilol 3.125 mg BID ACE/ARB/ARNI: Entresto 24/26 mg BID MRA: spironolactone 12.5 mg daily SGLT2i: Jardiance 10 mg daily  Prior to admission HF Medications: Diuretic: furosemide 40 mg daily ACE/ARB/ARNI: irbesartan 150 mg daily  Pertinent Lab Values: Serum creatinine 1.31, BUN 33, Potassium 4.5, Sodium 137, BNP 1972.3, A1c 8.3  Vital Signs: Weight: 190 lbs (admission weight: 198 lbs) Blood pressure: 90/50-110/60s  Heart rate: 80-90s  I/O: -4.5L yesterday; net -9L  Medication Assistance / Insurance Benefits Check: Does the patient have prescription insurance?  Yes Type of insurance plan: UHC Medicare  Does the patient qualify for medication assistance through manufacturers or grants?   Yes Eligible grants and/or patient assistance programs: Ferne Coe, ?Eliquis Medication assistance applications in progress: Ferne Coe, Eliquis  Medication assistance applications approved: none Approved medication  assistance renewals will be completed by: PCP [facility] - pt of Dr. Felipa Eth but will be seen by Dr. Koleen Nimrod after Dr. Fidela [place]  Outpatient Pharmacy:  Prior to admission outpatient [facility] Is the patient willing to use [facility] at discharge? Yes Is the patient willing to transition their outpatient pharmacy to utilize a [facility] outpatient pharmacy?   Pending    Assessment: 1. Acute systolic CHF (LVEF 29-51%), due to NICM (cardiomyopathy out of proportion to CAD). NYHA class II symptoms. - Continue furosemide 60 mg IV BID. Strict I/Os. Daily weights. Keep K>4 and Mag>2 - Continue carvedilol 3.125 mg BID - Continue Entresto 24/26 mg BID - Agree with starting starting spironolactone 12.5 mg daily - Continue Jardiance 10 mg daily    Plan: 1) Medication changes recommended at this time: - Send potential discharge meds to [facility] today and can deliver to bedside over the weekend for his discharge  2) Patient assistance: - Currently in the donut hole with his insurance - copays during this time are much higher than usual - Initiated assistance for [employer]. He states that a pharmacist with his PCP is working on [employer] assistance. Called and LVM with their pharmacist office for updates. He is requesting that his PCP will be the responsible party for assistance follow up and renewals. Faxed applications to [phone].  3)  Education  - Patient has been educated on current HF medications and potential additions to HF medication regimen - Patient verbalizes understanding that over the next few months, these medication doses may change and more medications may be added to optimize HF regimen - Patient has been educated on basic disease state pathophysiology and goals of therapy  Kerby Nora, PharmD, BCPS Heart Failure Stewardship Pharmacist Phone [phone]

## 2022-01-17 NOTE — Progress Notes (Signed)
Rounding Note    Patient Name: Phillip Rich Date of Encounter: 01/17/2022  [place] Cardiologist: Minus Breeding, MD   Subjective   Had left heart catheterization.  LVEDP was elevated.  Still overloaded.  Low-dose Entresto spironolactone begun.  Antecubital hematoma noted on forearm radial cath site.  Stable.  Heparin was held.  I would be comfortable starting Eliquis for further treatment of PE.  Feels much better today after further diuresis.  Inpatient Medications    Scheduled Meds:  apixaban  5 mg Oral BID   carvedilol  3.125 mg Oral BID WC   DULoxetine  60 mg Oral Daily   empagliflozin  10 mg Oral Daily   furosemide  60 mg Intravenous Q12H   influenza vaccine adjuvanted  0.5 mL Intramuscular Tomorrow-1000   insulin aspart  0-15 Units Subcutaneous TID WC   insulin aspart  0-5 Units Subcutaneous QHS   insulin aspart  6 Units Subcutaneous TID WC   insulin glargine-yfgn  10 Units Subcutaneous Daily   pantoprazole  80 mg Oral Daily   sacubitril-valsartan  1 tablet Oral BID   simvastatin  20 mg Oral q morning   sodium chloride flush  3 mL Intravenous Q12H   spironolactone  12.5 mg Oral Daily   zolpidem  5 mg Oral QHS   Continuous Infusions:  sodium chloride     sodium chloride     PRN Meds: sodium chloride, acetaminophen **OR** acetaminophen, albuterol, fluticasone, guaiFENesin-dextromethorphan, ondansetron **OR** ondansetron (ZOFRAN) IV, polyethylene glycol, sodium chloride flush   Vital Signs    Vitals:   01/16/22 2041 01/17/22 0529 01/17/22 0630 01/17/22 0806  BP: 113/68 (!) 98/53 104/65 105/66  Pulse: 89 86  90  Resp: '19 18  18  '$ Temp: 97.7 F (36.5 C) (!) 97.4 F (36.3 C)  97.9 F (36.6 C)  TempSrc: Oral Oral  Oral  SpO2: 97% 96%  96%  Weight:  86.2 kg    Height:        Intake/Output Summary (Last 24 hours) at 01/17/2022 0828 Last data filed at 01/17/2022 0805 Gross per 24 hour  Intake 840 ml  Output 3550 ml  Net -2710 ml       01/17/2022    5:29 AM 01/16/2022    5:00 AM 01/15/2022    4:36 AM  Last 3 Weights  Weight (lbs) 190 lb 1.6 oz 196 lb 4.8 oz 195 lb 8 oz  Weight (kg) 86.229 kg 89.041 kg 88.678 kg      Telemetry    Sinus rhythm 90s to 100 with occasional PVCs-no changes personally Reviewed  ECG    No new Ecg- Personally Reviewed  Physical Exam   GEN: No acute distress.   Neck: No JVD Cardiac: RRR, no murmurs, rubs, or gallops.  Respiratory: Clear to auscultation bilaterally. GI: Soft, nontender, non-distended  MS: No edema; No deformity.  Right forearm ecchymosis noted, stable Neuro:  Nonfocal  Psych: Normal affect   Labs    High Sensitivity Troponin:   Recent Labs  Lab 01/05/22 1211 01/09/22 1400 01/09/22 1457  TROPONINIHS 81* 100* 99*     Chemistry Recent Labs  Lab 01/13/22 0136 01/14/22 0424 01/15/22 0832 01/15/22 1055 01/16/22 0330 01/17/22 0556  NA 135   < > 136 135 131* 137  K 3.8   < > 4.7 4.3 4.0 4.5  CL 100   < > 98  --  97* 97*  CO2 28   < > 28  --  25  27  GLUCOSE 179*   < > 220*  --  165* 147*  BUN 39*   < > 33*  --  36* 33*  CREATININE 1.19   < > 1.42*  --  1.53* 1.31*  CALCIUM 8.8*   < > 9.1  --  8.5* 9.0  MG 1.6*  --   --   --   --  2.0  GFRNONAA >60   < > 53*  --  48* 58*  ANIONGAP 7   < > 10  --  9 13   < > = values in this interval not displayed.    Lipids  No results for input(s): "CHOL", "TRIG", "HDL", "LABVLDL", "LDLCALC", "CHOLHDL" in the last 168 hours.   Hematology Recent Labs  Lab 01/14/22 0424 01/15/22 0832 01/15/22 1055 01/16/22 0330 01/17/22 0556  WBC 10.9* 11.3*  --  9.6  --   RBC 3.46* 3.57*  --  3.25*  --   HGB 10.4* 10.8* 10.5* 9.7* 11.5*  HCT 33.1* 33.3* 31.0* 30.0* 34.8*  MCV 95.7 93.3  --  92.3  --   MCH 30.1 30.3  --  29.8  --   MCHC 31.4 32.4  --  32.3  --   RDW 15.9* 15.8*  --  15.7*  --   PLT 258 255  --  211  --    Thyroid  No results for input(s): "TSH", "FREET4" in the last 168 hours.   BNP No results for input(s):  "BNP", "PROBNP" in the last 168 hours.   DDimer No results for input(s): "DDIMER" in the last 168 hours.   Radiology    CARDIAC CATHETERIZATION  Result Date: 01/15/2022   Ost LM lesion is 20% stenosed.   Prox Cx to Dist Cx lesion is 70% stenosed.   Prox LAD to Mid LAD lesion is 30% stenosed.   Mid RCA lesion is 100% stenosed.   Prox RCA lesion is 70% stenosed. 1.  Significant two-vessel coronary artery disease with chronically occluded mid right coronary artery with left to right as well as bridging collaterals.  The left circumflex is tortuous and diffusely diseased in the midsegment. 2.  Left ventricular angiography was not performed.  EF was severely reduced by echo. 3.  Right heart catheterization showed significantly elevated right and left-sided filling pressures, moderate to severe pulmonary hypertension and moderately reduced cardiac output. Recommendations: The patient's cardiomyopathy seems to be out of proportion to his coronary artery disease.  PCI of the RCA should be reserved for refractory anginal symptoms.  Recommend medical therapy for now. The still significantly volume overloaded.  I switched furosemide to intravenous 40 mg twice daily and added small dose Entresto and small dose carvedilol.    Cardiac Studies     Echo from 01/10/22:   1. Left ventricular ejection fraction, by estimation, is 15-20%. The left  ventricle has severely decreased function. The left ventricle demonstrates  global hypokinesis. The left ventricular internal cavity size was mildly  dilated. Left ventricular  diastolic parameters are indeterminate.   2. Right ventricular systolic function is moderately reduced. The right  ventricular size is mildly enlarged. There is mildly elevated pulmonary  artery systolic pressure. The estimated right ventricular systolic  pressure is 37.6 mmHg.   3. The mitral valve is grossly normal. Moderate mitral valve  regurgitation - jet of MR is likely eccentric, and may be  underestimated.  No evidence of mitral stenosis.   4. Tricuspid valve regurgitation is moderate.   5.  The aortic valve is grossly normal. There is mild calcification of the  aortic valve. Aortic valve regurgitation is not visualized. No aortic  stenosis is present.   6. The inferior vena cava is normal in size with <50% respiratory  variability, suggesting right atrial pressure of 8 mmHg.   Conclusion(s)/Recommendation(s): No left ventricular mural or apical  thrombus/thrombi.   Cath  1.  Significant two-vessel coronary artery disease with chronically occluded mid right coronary artery with left to right as well as bridging collaterals.  The left circumflex is tortuous and diffusely diseased in the midsegment. 2.  Left ventricular angiography was not performed.  EF was severely reduced by echo. 3.  Right heart catheterization showed significantly elevated right and left-sided filling pressures, moderate to severe pulmonary hypertension and moderately reduced cardiac output.   Recommendations: The patient's cardiomyopathy seems to be out of proportion to his coronary artery disease.  PCI of the RCA should be reserved for refractory anginal symptoms.  Recommend medical therapy for now. The still significantly volume overloaded.  I switched furosemide to intravenous 40 mg twice daily and added small dose Entresto and small dose carvedilol.  Patient Profile     71 y.o. male with newly discovered ejection fraction 15 to 20% recent acute pulmonary embolism on heparin IV  Assessment & Plan    Acute systolic heart failure newly diagnosed with a EF 15 to 20% -Cardiomyopathy out of proportion to coronary artery disease as described above.  PCI should be reserved to RCA for refractory anginal symptoms.  Now on Entresto, continue with IV Lasix for further diuresis.  4 L out yesterday.  Watch creatinine.  It is 1.31 today, down from 1.53 yesterday.  Weight 86.2 kg (90.1 kg on arrival). Small dose  carvedilol as well as spironolactone and low-dose Entresto.  BP soft.  Feels better overall.  Continue with diuresis again today.  Check creatinine again tomorrow.  Acute PE - Now on Eliquis for PE treatment.  Anticoagulation was held 2 days ago  because of right forearm hematoma post catheterization.  Hemoglobin stable 11.5.  Hematoma right forearm noted.  It is marked.  Watch for any signs of worsening.  Currently stable  Diabetes with hypertension - Per primary team.    For questions or updates, please contact [place] Please consult [email or web address] for contact info under        Signed, Candee Furbish, MD  01/17/2022, 8:28 AM

## 2022-01-17 NOTE — Progress Notes (Addendum)
Progress Note   Patient: Phillip Rich KGU:[id number] DOB: 21-Sep-1950 DOA: 01/09/2022     8 DOS: the patient was seen and examined on 01/17/2022   Brief hospital course: Mr. Marchio was admitted to the hospital with the working diagnosis of decompensated heart failure, complicated with acute pulmonary embolism.   71 yo male with the past medical history of T2DM, hypertension, sp nephrectomy for a renal mass. Rcent diagnosis of pulmonary embolism, and heart failure on 01/05/22, patient left against medical advice but able to take anticoagulation with apixaban and diuretic therapy with furosemide. At home patient had dyspnea on exertion, lower extremity edema, orthopnea and weight gain 11 lbs over the 7 days prior to admission. On his physical examination in the ED his blood pressure was 133/87, HR 109, RR 15 and 02 saturation 100% on room air. Lungs with decreased breath sounds and diffuse rales bilaterally, with no wheezing, heart with S1 and S2 present and rhythmic, abdomen with no distention, ans positive lower extremity edema.   Na 133, K 4,5 Cl 102 bicarbonate 24, glucose 330 bun 45 cr 1,30  BNP 2,249 High sensitive troponin 100, 99  Wbc 11,7 hgb 11,1 plt 366  Sars covid 19 negative   Chest radiograph with cardiomegaly bilateral hilar vascular congestion with cephalization of the vasculature and bilateral small pleural effusions.   EKG 119 bpm, right axis, normal intervals, sinus rhythm, left atrial enlargement, with no significant ST segment or T wave changes.   Patient was placed on furosemide for diuresis.   Echocardiogram with reduced LV systolic function EF 15 to 20%, with global hypokinesis.  09/05 transferred to [facility] for further ischemic work up with cardiac catheterization.   09/06 cardiac catheterization with significant 2 vessel disease.  Right heart catheterization with significant elevation in right and left filling pressures. Moderate to severe pulmonary hypertension and  moderate reduced cardiac output. Right arm hematoma post cath and anticoagulation was held. 09/07 volume status is improving. Hematoma stable, and resumed on anticoagulation with apixaban.  09/08 volume status continue to improve.  Possible dc home in 24 hrs.   Assessment and Plan: * Acute systolic (congestive) heart failure (HCC) Echocardiogram with reduced LV systolic function with EF 15 to 20%, global hypokinesis, with mild dilatation of internal cavity. Moderate reduction of RV systolic function RVSP 62,8. Moderate mitral valve regurgitation. Moderate TR.   09/06 cardiac catheterization with significant 2 vessel disease.  Right heart catheterization with significant elevation in right and left filling pressures. Moderate to severe pulmonary hypertension and moderate reduced cardiac output.  Acute on chronic core pulmonale.  Urine output is 3,151  ml Systolic blood pressure 761 to 104 mmHg.  Improving symptoms.   Medical therapy with carvedilol, entresto, Diuresis with empagliflozin, spironolaactone and furosemide.   Acute pulmonary embolism (HCC) Hematoma and Hgb are stable, follow up hgb is 11.5   Continue anticoagulation with apixaban.   Acute renal failure (HCC) Hypomagnesemia, hyponatremia   Volume status is improving Renal function today with serum cr at 1.31, K is 4,5 and serum bicarbonate at 27 Continue close follow up on renal function and electrolytes.   Diuretic therapy with spironolactone and SGLT 2 inh. Continue with entresto for heart failure.   HTN (hypertension) Continue blood pressure control with carvedilol and entresto Continue diuresis.   Coronary artery disease Patient with 2 vessel disease  No active chest pain.  Plan to continue with statin therapy Not on antiplatelet therapy due to full anticoagulation for pulmonary embolism Positive right upper  extremity hematoma   DM2 (diabetes mellitus, type 2) (HCC) Hyperglycemia with capillary glucose  up to 249 mg/dl.  Fasting glucose this am 147 Plan to continue insulin sliding scale for glucose cover and monitoring.  Basal insulin 10 units daily and pre meal 6 units of short acting insulin.   Tobacco abuse Continue smoking cessation         Subjective: Patient with improvement in his symptoms no chest pain, edema and dyspnea continue to improve   Physical Exam: Vitals:   01/16/22 2041 01/17/22 0529 01/17/22 0630 01/17/22 0806  BP: 113/68 (!) 98/53 104/65 105/66  Pulse: 89 86  90  Resp: '19 18  18  '$ Temp: 97.7 F (36.5 C) (!) 97.4 F (36.3 C)  97.9 F (36.6 C)  TempSrc: Oral Oral  Oral  SpO2: 97% 96%  96%  Weight:  86.2 kg    Height:       Neurology awake and alert ENT with no pallor Cardiovascular with S1 and S2 present and rhythmic No JVD Respiratory with mild rales at bases  Abdomen with no distention  Trace lower extremity edema Data Reviewed:    Family Communication: no family at the bedside   Disposition: Status is: Inpatient Remains inpatient appropriate because: heart failure   Planned Discharge Destination: Home    Author: Tawni Millers, MD 01/17/2022 3:17 PM  For on call review [email or web address].

## 2022-01-17 NOTE — Assessment & Plan Note (Signed)
Patient with 2 vessel disease  No active chest pain.  Plan to continue with statin therapy Not on antiplatelet therapy due to full anticoagulation for pulmonary embolism Positive right upper extremity hematoma

## 2022-01-18 DIAGNOSIS — I2699 Other pulmonary embolism without acute cor pulmonale: Secondary | ICD-10-CM | POA: Diagnosis not present

## 2022-01-18 DIAGNOSIS — E119 Type 2 diabetes mellitus without complications: Secondary | ICD-10-CM | POA: Diagnosis not present

## 2022-01-18 DIAGNOSIS — I5021 Acute systolic (congestive) heart failure: Secondary | ICD-10-CM | POA: Diagnosis not present

## 2022-01-18 DIAGNOSIS — I10 Essential (primary) hypertension: Secondary | ICD-10-CM | POA: Diagnosis not present

## 2022-01-18 LAB — BASIC METABOLIC PANEL
Anion gap: 11 (ref 5–15)
BUN: 36 mg/dL — ABNORMAL HIGH (ref 8–23)
CO2: 31 mmol/L (ref 22–32)
Calcium: 9.4 mg/dL (ref 8.9–10.3)
Chloride: 94 mmol/L — ABNORMAL LOW (ref 98–111)
Creatinine, Ser: 1.47 mg/dL — ABNORMAL HIGH (ref 0.61–1.24)
GFR, Estimated: 51 mL/min — ABNORMAL LOW (ref >60)
Glucose, Bld: 208 mg/dL — ABNORMAL HIGH (ref 70–99)
Potassium: 4.2 mmol/L (ref 3.5–5.1)
Sodium: 136 mmol/L (ref 135–145)

## 2022-01-18 LAB — CBC
HCT: 35.4 % — ABNORMAL LOW (ref 39.0–52.0)
Hemoglobin: 11.3 g/dL — ABNORMAL LOW (ref 13.0–17.0)
MCH: 29.5 pg (ref 26.0–34.0)
MCHC: 31.9 g/dL (ref 30.0–36.0)
MCV: 92.4 fL (ref 80.0–100.0)
Platelets: 219 10*3/uL (ref 150–400)
RBC: 3.83 MIL/uL — ABNORMAL LOW (ref 4.22–5.81)
RDW: 15.6 % — ABNORMAL HIGH (ref 11.5–15.5)
WBC: 9.8 10*3/uL (ref 4.0–10.5)
nRBC: 0 % (ref 0.0–0.2)

## 2022-01-18 LAB — GLUCOSE, CAPILLARY
Glucose-Capillary: 213 mg/dL — ABNORMAL HIGH (ref 70–99)
Glucose-Capillary: 188 mg/dL — ABNORMAL HIGH (ref 70–99)
Glucose-Capillary: 223 mg/dL — ABNORMAL HIGH (ref 70–99)
Glucose-Capillary: 150 mg/dL — ABNORMAL HIGH (ref 70–99)

## 2022-01-18 MED ORDER — FUROSEMIDE 10 MG/ML IJ SOLN
40.0000 mg | Freq: Two times a day (BID) | INTRAMUSCULAR | Status: DC
  Administered 2022-01-18 – 2022-01-19 (×2): 40 mg via INTRAVENOUS
  Filled 2022-01-18 (×2): qty 4

## 2022-01-18 NOTE — Progress Notes (Addendum)
Rounding Note    Patient Name: Phillip Rich Date of Encounter: 01/18/2022  [place] Cardiologist: Minus Breeding, MD   Subjective   Had left heart catheterization.  LVEDP was elevated.  Low-dose Entresto spironolactone begun.  Antecubital hematoma noted on forearm radial cath site.  Stable.  Heparin was held.    Feels much better today after further diuresis.  Inpatient Medications    Scheduled Meds:  apixaban  5 mg Oral BID   atorvastatin  80 mg Oral Daily   carvedilol  3.125 mg Oral BID WC   DULoxetine  60 mg Oral Daily   empagliflozin  10 mg Oral Daily   furosemide  60 mg Intravenous Q12H   influenza vaccine adjuvanted  0.5 mL Intramuscular Tomorrow-1000   insulin aspart  0-15 Units Subcutaneous TID WC   insulin aspart  0-5 Units Subcutaneous QHS   insulin aspart  6 Units Subcutaneous TID WC   insulin glargine-yfgn  10 Units Subcutaneous Daily   pantoprazole  80 mg Oral Daily   sacubitril-valsartan  1 tablet Oral BID   sodium chloride flush  3 mL Intravenous Q12H   spironolactone  12.5 mg Oral Daily   zolpidem  5 mg Oral QHS   Continuous Infusions:  sodium chloride     sodium chloride     PRN Meds: sodium chloride, acetaminophen **OR** acetaminophen, albuterol, fluticasone, guaiFENesin-dextromethorphan, ondansetron **OR** ondansetron (ZOFRAN) IV, polyethylene glycol, sodium chloride flush   Vital Signs    Vitals:   01/17/22 0806 01/17/22 2010 01/18/22 0000 01/18/22 0345  BP: 105/66 120/69  96/62  Pulse: 90     Resp: 18     Temp: 97.9 F (36.6 C) 97.6 F (36.4 C)  97.6 F (36.4 C)  TempSrc: Oral Oral  Oral  SpO2: 96%   97%  Weight:   84.6 kg   Height:        Intake/Output Summary (Last 24 hours) at 01/18/2022 0804 Last data filed at 01/18/2022 0100 Gross per 24 hour  Intake 960 ml  Output 3150 ml  Net -2190 ml       01/18/2022   12:00 AM 01/17/2022    5:29 AM 01/16/2022    5:00 AM  Last 3 Weights  Weight (lbs) 186 lb 6.4 oz 190 lb  1.6 oz 196 lb 4.8 oz  Weight (kg) 84.55 kg 86.229 kg 89.041 kg      Telemetry    NSR -no changes personally Reviewed  ECG    No new Ecg- Personally Reviewed  Physical Exam   GEN: Well nourished, well developed in no acute distress HEENT: Normal NECK: No JVD; No carotid bruits LYMPHATICS: No lymphadenopathy CARDIAC:RRR, no murmurs, rubs, gallops RESPIRATORY: Faint crackles at right base ABDOMEN: Soft, non-tender, non-distended MUSCULOSKELETAL:  No edema; No deformity  SKIN: Warm and dry.  Large area of ecchymosis over the right forearm but no hematoma.  There has been no extension of the hematoma compared to yesterday based on markings on his arm NEUROLOGIC:  Alert and oriented x 3 PSYCHIATRIC:  Normal affect   Labs    High Sensitivity Troponin:   Recent Labs  Lab 01/05/22 1211 01/09/22 1400 01/09/22 1457  TROPONINIHS 81* 100* 99*      Chemistry Recent Labs  Lab 01/13/22 0136 01/14/22 0424 01/16/22 0330 01/17/22 0556 01/18/22 0321  NA 135   < > 131* 137 136  K 3.8   < > 4.0 4.5 4.2  CL 100   < > 97*  97* 94*  CO2 28   < > '25 27 31  '$ GLUCOSE 179*   < > 165* 147* 208*  BUN 39*   < > 36* 33* 36*  CREATININE 1.19   < > 1.53* 1.31* 1.47*  CALCIUM 8.8*   < > 8.5* 9.0 9.4  MG 1.6*  --   --  2.0  --   GFRNONAA >60   < > 48* 58* 51*  ANIONGAP 7   < > '9 13 11   '$ < > = values in this interval not displayed.     Lipids  No results for input(s): "CHOL", "TRIG", "HDL", "LABVLDL", "LDLCALC", "CHOLHDL" in the last 168 hours.   Hematology Recent Labs  Lab 01/15/22 0832 01/15/22 1055 01/16/22 0330 01/17/22 0556 01/18/22 0321  WBC 11.3*  --  9.6  --  9.8  RBC 3.57*  --  3.25*  --  3.83*  HGB 10.8*   < > 9.7* 11.5* 11.3*  HCT 33.3*   < > 30.0* 34.8* 35.4*  MCV 93.3  --  92.3  --  92.4  MCH 30.3  --  29.8  --  29.5  MCHC 32.4  --  32.3  --  31.9  RDW 15.8*  --  15.7*  --  15.6*  PLT 255  --  211  --  219   < > = values in this interval not displayed.    Thyroid   No results for input(s): "TSH", "FREET4" in the last 168 hours.   BNP No results for input(s): "BNP", "PROBNP" in the last 168 hours.   DDimer No results for input(s): "DDIMER" in the last 168 hours.   Radiology    No results found.  Cardiac Studies     Echo from 01/10/22:   1. Left ventricular ejection fraction, by estimation, is 15-20%. The left  ventricle has severely decreased function. The left ventricle demonstrates  global hypokinesis. The left ventricular internal cavity size was mildly  dilated. Left ventricular  diastolic parameters are indeterminate.   2. Right ventricular systolic function is moderately reduced. The right  ventricular size is mildly enlarged. There is mildly elevated pulmonary  artery systolic pressure. The estimated right ventricular systolic  pressure is 94.8 mmHg.   3. The mitral valve is grossly normal. Moderate mitral valve  regurgitation - jet of MR is likely eccentric, and may be underestimated.  No evidence of mitral stenosis.   4. Tricuspid valve regurgitation is moderate.   5. The aortic valve is grossly normal. There is mild calcification of the  aortic valve. Aortic valve regurgitation is not visualized. No aortic  stenosis is present.   6. The inferior vena cava is normal in size with <50% respiratory  variability, suggesting right atrial pressure of 8 mmHg.   Conclusion(s)/Recommendation(s): No left ventricular mural or apical  thrombus/thrombi.   Cath  1.  Significant two-vessel coronary artery disease with chronically occluded mid right coronary artery with left to right as well as bridging collaterals.  The left circumflex is tortuous and diffusely diseased in the midsegment. 2.  Left ventricular angiography was not performed.  EF was severely reduced by echo. 3.  Right heart catheterization showed significantly elevated right and left-sided filling pressures, moderate to severe pulmonary hypertension and moderately reduced  cardiac output.   Recommendations: The patient's cardiomyopathy seems to be out of proportion to his coronary artery disease.  PCI of the RCA should be reserved for refractory anginal symptoms.  Recommend medical therapy for  now. The still significantly volume overloaded.  I switched furosemide to intravenous 40 mg twice daily and added small dose Entresto and small dose carvedilol.  Patient Profile     71 y.o. male with newly discovered ejection fraction 15 to 20% recent acute pulmonary embolism on heparin IV  Assessment & Plan    Acute systolic heart failure newly diagnosed with a EF 15 to 20% -Cardiomyopathy out of proportion to coronary artery disease as described above. --markedly volume overloaded by heart cath --now on Lasix '60mg'$  IV BID --he put out 3.15 L yesterday and is net -10.89 L since admission --Weight is down 4 pounds from yesterday and 12 pounds from admission --Serum creatinine slightly bumped from 1.31->>1.47 this morning but he is also status post --since he had such a good urine output with 60 mg IV Lasix and his serum creatinine bumped some I will decrease his Lasix to 40 mg IV twice daily --He still has a few crackles at the right base --Continue to monitor renal function, I's and O's and weights while diuresing --Continue carvedilol 3.125 mg twice daily, Jardiance 10 mg daily, Entresto 24-26 mg twice daily and spironolactone 12.5 mg daily  ASCAD --Status post cath showing significant two-vessel CAD with chronically occluded mid RCA with left-to-right as well as bridging collaterals and tortuous left circumflex diffusely diseased in the mid segment  -- Felt that cardiomyopathy out of proportion to degree of CAD and PCI should be reserved to RCA for refractory anginal symptoms. -- No aspirin due to DOAC -- Continue carvedilol 3.125 mg twice daily, atorvastatin 80 mg daily  Acute PE - Now on Eliquis for PE treatment.   -- Anticoagulation was held 2 days ago   because of right forearm hematoma post catheterization.  --  Hemoglobin stable 11.3.  Hematoma right forearm noted.  It is marked.  Watch for any signs of worsening.  Currently stable  Diabetes with hypertension - Per primary team.  I have spent a total of 35 minutes with patient reviewing cardiac cath, 2D echo, telemetry, EKGs, labs and examining patient as well as establishing an assessment and plan that was discussed with the patient.  > 50% of time was spent in direct patient care.     For questions or updates, please contact [place] Please consult [email or web address] for contact info under        Signed, Fransico Him, MD  01/18/2022, 8:04 AM

## 2022-01-18 NOTE — Progress Notes (Signed)
Mobility Specialist - Progress Note   01/18/22 1506  Mobility  Activity Ambulated with assistance in hallway  Level of Assistance Standby assist, set-up cues, supervision of patient - no hands on  Assistive Device None  Distance Ambulated (ft) 550 ft  Activity Response Tolerated well  $Mobility charge 1 Mobility   Pt was received in bed and agreeable to mobility. Pt c/o slight leg weakness throughout ambulation. Pt was returned back to bed with all needs met.  Amaya Reives  Mobility Specialist  

## 2022-01-18 NOTE — Progress Notes (Addendum)
Progress Note   Patient: Phillip Rich HGD:[id number] DOB: 1951-03-18 DOA: 01/09/2022     9 DOS: the patient was seen and examined on 01/18/2022   Brief hospital course: Phillip Rich was admitted to the hospital with the working diagnosis of decompensated heart failure, complicated with acute pulmonary embolism.   71 yo male with the past medical history of T2DM, hypertension, sp nephrectomy for a renal mass. Rcent diagnosis of pulmonary embolism, and heart failure on 01/05/22, patient left against medical advice but able to take anticoagulation with apixaban and diuretic therapy with furosemide. At home patient had dyspnea on exertion, lower extremity edema, orthopnea and weight gain 11 lbs over the 7 days prior to admission. On his physical examination in the ED his blood pressure was 133/87, HR 109, RR 15 and 02 saturation 100% on room air. Lungs with decreased breath sounds and diffuse rales bilaterally, with no wheezing, heart with S1 and S2 present and rhythmic, abdomen with no distention, ans positive lower extremity edema.   Na 133, K 4,5 Cl 102 bicarbonate 24, glucose 330 bun 45 cr 1,30  BNP 2,249 High sensitive troponin 100, 99  Wbc 11,7 hgb 11,1 plt 366  Sars covid 19 negative   Chest radiograph with cardiomegaly bilateral hilar vascular congestion with cephalization of the vasculature and bilateral small pleural effusions.   EKG 119 bpm, right axis, normal intervals, sinus rhythm, left atrial enlargement, with no significant ST segment or T wave changes.   Patient was placed on furosemide for diuresis.   Echocardiogram with reduced LV systolic function EF 15 to 20%, with global hypokinesis.  09/05 transferred to [facility] for further ischemic work up with cardiac catheterization.   09/06 cardiac catheterization with significant 2 vessel disease.  Right heart catheterization with significant elevation in right and left filling pressures. Moderate to severe pulmonary hypertension and  moderate reduced cardiac output. Right arm hematoma post cath and anticoagulation was held. 09/07 volume status is improving. Hematoma stable, and resumed on anticoagulation with apixaban.  09/08 volume status continue to improve.   Assessment and Plan: * Acute systolic (congestive) heart failure (HCC) Echocardiogram with reduced LV systolic function with EF 15 to 20%, global hypokinesis, with mild dilatation of internal cavity. Moderate reduction of RV systolic function RVSP 96,2. Moderate mitral valve regurgitation. Moderate TR.   09/06 cardiac catheterization with significant 2 vessel disease.  Right heart catheterization with significant elevation in right and left filling pressures. Moderate to severe pulmonary hypertension and moderate reduced cardiac output.  Acute on chronic core pulmonale.  Urine output is 2,297  ml Systolic blood pressure 96 to 104 mmHg.  Improving symptoms.   Medical therapy with carvedilol, entresto, Diuresis with empagliflozin, spironolactone Decrease dose of furosemide Possible discharge home tomorrow.   Acute pulmonary embolism (HCC) Hematoma and Hgb are stable, follow up hgb is 11.5   Continue anticoagulation with apixaban.   Acute renal failure (HCC) Hypomagnesemia, hyponatremia   Renal function with serum cr at 1,47, K is 4,2 and serum bicarbonate at 31. Na 136   Diuretic therapy with spironolactone and SGLT 2 inh. Continue with entresto for heart failure.  Continue diuresis with furosemide.   HTN (hypertension) Continue blood pressure control with carvedilol and entresto Continue diuresis.   Coronary artery disease Patient with 2 vessel disease  No active chest pain.  Plan to continue with statin therapy Not on antiplatelet therapy due to full anticoagulation for pulmonary embolism Positive right upper extremity hematoma   DM2 (diabetes mellitus, type  2) (HCC) Hyperglycemia   Fasting glucose this am 208 Plan to continue insulin  sliding scale for glucose cover and monitoring.  Basal insulin 10 units daily and pre meal 6 units of short acting insulin.   Tobacco abuse Continue smoking cessation         Subjective: Patient with no chest pain, dyspnea and edema continue to improve.   Physical Exam: Vitals:   01/17/22 2010 01/18/22 0000 01/18/22 0345 01/18/22 0807  BP: 120/69  96/62 104/75  Pulse:      Resp:      Temp: 97.6 F (36.4 C)  97.6 F (36.4 C)   TempSrc: Oral  Oral   SpO2:   97%   Weight:  84.6 kg    Height:       Neurology awake and alert ENT with mild pallor Cardiovascular with S1 and S2 present and rhythmic with no gallops or murmurs No JVD Trace lower extremity edema Respiratory with rales at bases with no wheezing Abdomen with no distention  Data Reviewed:    Family Communication: no family at the bedside   Disposition: Status is: Inpatient Remains inpatient appropriate because: heart failure with volume overload.   Planned Discharge Destination: Home     Author: Tawni Millers, MD 01/18/2022 9:57 AM  For on call review [email or web address].

## 2022-01-19 ENCOUNTER — Other Ambulatory Visit: Payer: Self-pay | Admitting: Cardiology

## 2022-01-19 DIAGNOSIS — I5043 Acute on chronic combined systolic (congestive) and diastolic (congestive) heart failure: Secondary | ICD-10-CM

## 2022-01-19 DIAGNOSIS — I5021 Acute systolic (congestive) heart failure: Secondary | ICD-10-CM | POA: Diagnosis not present

## 2022-01-19 DIAGNOSIS — I2699 Other pulmonary embolism without acute cor pulmonale: Secondary | ICD-10-CM | POA: Diagnosis not present

## 2022-01-19 DIAGNOSIS — E119 Type 2 diabetes mellitus without complications: Secondary | ICD-10-CM | POA: Diagnosis not present

## 2022-01-19 DIAGNOSIS — I10 Essential (primary) hypertension: Secondary | ICD-10-CM | POA: Diagnosis not present

## 2022-01-19 LAB — BASIC METABOLIC PANEL
Anion gap: 14 (ref 5–15)
BUN: 42 mg/dL — ABNORMAL HIGH (ref 8–23)
CO2: 26 mmol/L (ref 22–32)
Calcium: 9.3 mg/dL (ref 8.9–10.3)
Chloride: 94 mmol/L — ABNORMAL LOW (ref 98–111)
Creatinine, Ser: 1.55 mg/dL — ABNORMAL HIGH (ref 0.61–1.24)
GFR, Estimated: 48 mL/min — ABNORMAL LOW (ref >60)
Glucose, Bld: 194 mg/dL — ABNORMAL HIGH (ref 70–99)
Potassium: 4.5 mmol/L (ref 3.5–5.1)
Sodium: 134 mmol/L — ABNORMAL LOW (ref 135–145)

## 2022-01-19 LAB — GLUCOSE, CAPILLARY
Glucose-Capillary: 182 mg/dL — ABNORMAL HIGH (ref 70–99)
Glucose-Capillary: 197 mg/dL — ABNORMAL HIGH (ref 70–99)
Glucose-Capillary: 213 mg/dL — ABNORMAL HIGH (ref 70–99)
Glucose-Capillary: 202 mg/dL — ABNORMAL HIGH (ref 70–99)

## 2022-01-19 MED ORDER — FUROSEMIDE 40 MG PO TABS: 40.0000 mg | ORAL_TABLET | Freq: Two times a day (BID) | ORAL | Status: DC

## 2022-01-19 NOTE — Discharge Instructions (Signed)
Weigh Daily and keep a log of weights If you gain more than 5 pounds in a week or 3 pounds in a day call Dr. Rosezella [place] office  Heart Healthy low salt diet.   You need lab work on Tuesday.  Have this done at Dr. Rosezella [place] office - go in the morning on Tuesday.    Information on my medicine - ELIQUIS (apixaban)  This medication education was reviewed with me or my healthcare representative as part of my discharge preparation.    Why was Eliquis prescribed for you? Eliquis was prescribed to treat blood clots that may have been found in the veins of your legs (deep vein thrombosis) or in your lungs (pulmonary embolism) and to reduce the risk of them occurring again.  What do You need to know about Eliquis ?  The dose is  ONE 5 mg tablet taken TWICE daily.  Eliquis may be taken with or without food.   Try to take the dose about the same time in the morning and in the evening. If you have difficulty swallowing the tablet whole please discuss with your pharmacist how to take the medication safely.  Take Eliquis exactly as prescribed and DO NOT stop taking Eliquis without talking to the doctor who prescribed the medication.  Stopping may increase your risk of developing a new blood clot.  Refill your prescription before you run out.  After discharge, you should have regular check-up appointments with your healthcare provider that is prescribing your Eliquis.    What do you do if you miss a dose? If a dose of ELIQUIS is not taken at the scheduled time, take it as soon as possible on the same day and twice-daily administration should be resumed. The dose should not be doubled to make up for a missed dose.  Important Safety Information A possible side effect of Eliquis is bleeding. You should call your healthcare provider right away if you experience any of the following: Bleeding from an injury or your nose that does not stop. Unusual colored urine (red or dark brown) or unusual  colored stools (red or black). Unusual bruising for unknown reasons. A serious fall or if you hit your head (even if there is no bleeding).  Some medicines may interact with Eliquis and might increase your risk of bleeding or clotting while on Eliquis. To help avoid this, consult your healthcare provider or pharmacist prior to using any new prescription or non-prescription medications, including herbals, vitamins, non-steroidal anti-inflammatory drugs (NSAIDs) and supplements.  This website has more information on Eliquis (apixaban): http://www.eliquis.com/eliquis/home

## 2022-01-19 NOTE — Progress Notes (Signed)
Progress Note   Patient: Phillip Rich URK:[id number] DOB: 04-23-1951 DOA: 01/09/2022     10 DOS: the patient was seen and examined on 01/19/2022   Brief hospital course: Mr. Bushey was admitted to the hospital with the working diagnosis of decompensated heart failure, complicated with acute pulmonary embolism.   71 yo male with the past medical history of T2DM, hypertension, sp nephrectomy for a renal mass. Rcent diagnosis of pulmonary embolism, and heart failure on 01/05/22, patient left against medical advice but able to take anticoagulation with apixaban and diuretic therapy with furosemide. At home patient had dyspnea on exertion, lower extremity edema, orthopnea and weight gain 11 lbs over the 7 days prior to admission. On his physical examination in the ED his blood pressure was 133/87, HR 109, RR 15 and 02 saturation 100% on room air. Lungs with decreased breath sounds and diffuse rales bilaterally, with no wheezing, heart with S1 and S2 present and rhythmic, abdomen with no distention, ans positive lower extremity edema.   Na 133, K 4,5 Cl 102 bicarbonate 24, glucose 330 bun 45 cr 1,30  BNP 2,249 High sensitive troponin 100, 99  Wbc 11,7 hgb 11,1 plt 366  Sars covid 19 negative   Chest radiograph with cardiomegaly bilateral hilar vascular congestion with cephalization of the vasculature and bilateral small pleural effusions.   EKG 119 bpm, right axis, normal intervals, sinus rhythm, left atrial enlargement, with no significant ST segment or T wave changes.   Patient was placed on furosemide for diuresis.   Echocardiogram with reduced LV systolic function EF 15 to 20%, with global hypokinesis.  09/05 transferred to [facility] for further ischemic work up with cardiac catheterization.   09/06 cardiac catheterization with significant 2 vessel disease.  Right heart catheterization with significant elevation in right and left filling pressures. Moderate to severe pulmonary hypertension and  moderate reduced cardiac output. Right arm hematoma post cath and anticoagulation was held. 09/07 volume status is improving. Hematoma stable, and resumed on anticoagulation with apixaban.  09/08 volume status continue to improve. \ 09/10 hospitalization has been prolonged due to renal failure    Assessment and Plan: * Acute systolic (congestive) heart failure (HCC) Echocardiogram with reduced LV systolic function with EF 15 to 20%, global hypokinesis, with mild dilatation of internal cavity. Moderate reduction of RV systolic function RVSP 83,1. Moderate mitral valve regurgitation. Moderate TR.   09/06 cardiac catheterization with significant 2 vessel disease.  Right heart catheterization with significant elevation in right and left filling pressures. Moderate to severe pulmonary hypertension and moderate reduced cardiac output.  Acute on chronic core pulmonale.  Urine output is 5,176  ml Systolic blood pressure 160 mmHg.   Medical therapy with carvedilol, entresto, Diuresis with empagliflozin, spironolactone Hold furosemide for now, likely patient with over diuresis Possible discharge home tomorrow.   Acute pulmonary embolism (HCC) Hematoma and Hgb are stable, follow up hgb is 11.5   Continue anticoagulation with apixaban.   Acute renal failure (HCC) Hypomagnesemia, hyponatremia   Serum cr today is 1,55 with K at 4,5 and serum bicarbonate at 26.  Na is 134.   Continue with spironolactone and SGLT 2 inh. Continue with entresto for heart failure.  Hold on loop diuretic for today.   HTN (hypertension) Continue blood pressure control with carvedilol and entresto Holding on loop diuretic today.   Coronary artery disease Patient with 2 vessel disease  No active chest pain.  Plan to continue with statin therapy Not on antiplatelet therapy due to  full anticoagulation for pulmonary embolism Positive right upper extremity hematoma   DM2 (diabetes mellitus, type 2)  (HCC) Hyperglycemia   Fasting glucose this am 194 Plan to continue insulin sliding scale for glucose cover and monitoring.  Basal insulin 10 units daily and pre meal 6 units of short acting insulin.   Tobacco abuse Continue smoking cessation         Subjective: Patient with no chest pain or dyspnea, no lower extremity edema   Physical Exam: Vitals:   01/18/22 1752 01/18/22 2020 01/19/22 0419 01/19/22 0945  BP: 103/84 110/75 108/68 109/73  Pulse:  94 88 87  Resp:  '20 20 17  '$ Temp:  97.8 F (36.6 C) 97.8 F (36.6 C) 97.6 F (36.4 C)  TempSrc:  Oral Oral Oral  SpO2:  97% 97% 100%  Weight:   83.5 kg   Height:       Neurology awake and alert ENT with mild pallor Cardiovascular with S1 and S2 present and rhythmic with no gallops, rubs or murmurs Respiratory with no rales or wheezing Abdomen with no distention  No lower extremity edema  Data Reviewed:    Family Communication: no family at the bedside  Disposition: Status is: Inpatient Remains inpatient appropriate because: heart failure and renal failure   Planned Discharge Destination: Home     Author: Tawni Millers, MD 01/19/2022 11:37 AM  For on call review [email or web address].

## 2022-01-19 NOTE — Progress Notes (Signed)
Rounding Note    Patient Name: Phillip Rich Date of Encounter: 01/19/2022  [place] Cardiologist: Minus Breeding, MD   Subjective   Had left heart catheterization.  LVEDP was elevated.  Low-dose Entresto spironolactone started  Antecubital hematoma noted on forearm radial cath site.  Stable.  Heparin was held.    SOB resolved.  Denies any CP  Inpatient Medications    Scheduled Meds:  apixaban  5 mg Oral BID   atorvastatin  80 mg Oral Daily   carvedilol  3.125 mg Oral BID WC   DULoxetine  60 mg Oral Daily   empagliflozin  10 mg Oral Daily   furosemide  40 mg Intravenous Q12H   influenza vaccine adjuvanted  0.5 mL Intramuscular Tomorrow-1000   insulin aspart  0-15 Units Subcutaneous TID WC   insulin aspart  0-5 Units Subcutaneous QHS   insulin aspart  6 Units Subcutaneous TID WC   insulin glargine-yfgn  10 Units Subcutaneous Daily   pantoprazole  80 mg Oral Daily   sacubitril-valsartan  1 tablet Oral BID   sodium chloride flush  3 mL Intravenous Q12H   spironolactone  12.5 mg Oral Daily   zolpidem  5 mg Oral QHS   Continuous Infusions:  sodium chloride     sodium chloride     PRN Meds: sodium chloride, acetaminophen **OR** acetaminophen, albuterol, fluticasone, guaiFENesin-dextromethorphan, ondansetron **OR** ondansetron (ZOFRAN) IV, polyethylene glycol, sodium chloride flush   Vital Signs    Vitals:   01/18/22 1752 01/18/22 2020 01/19/22 0419 01/19/22 0945  BP: 103/84 110/75 108/68 109/73  Pulse:  94 88 87  Resp:  '20 20 17  '$ Temp:  97.8 F (36.6 C) 97.8 F (36.6 C) 97.6 F (36.4 C)  TempSrc:  Oral Oral Oral  SpO2:  97% 97% 100%  Weight:   83.5 kg   Height:        Intake/Output Summary (Last 24 hours) at 01/19/2022 1007 Last data filed at 01/19/2022 9326 Gross per 24 hour  Intake 720 ml  Output 3200 ml  Net -2480 ml       01/19/2022    4:19 AM 01/18/2022   12:00 AM 01/17/2022    5:29 AM  Last 3 Weights  Weight (lbs) 184 lb 1.6 oz  186 lb 6.4 oz 190 lb 1.6 oz  Weight (kg) 83.507 kg 84.55 kg 86.229 kg      Telemetry    NSR -no changes personally Reviewed  ECG    No new Ecg- Personally Reviewed  Physical Exam   GEN: Well nourished, well developed in no acute distress HEENT: Normal NECK: No JVD; No carotid bruits LYMPHATICS: No lymphadenopathy CARDIAC:RRR, no murmurs, rubs, gallops RESPIRATORY:  Clear to auscultation without rales, wheezing or rhonchi  ABDOMEN: Soft, non-tender, non-distended MUSCULOSKELETAL:  No edema; No deformity  SKIN: Warm and dry NEUROLOGIC:  Alert and oriented x 3 PSYCHIATRIC:  Normal affect   Labs    High Sensitivity Troponin:   Recent Labs  Lab 01/05/22 1211 01/09/22 1400 01/09/22 1457  TROPONINIHS 81* 100* 99*      Chemistry Recent Labs  Lab 01/13/22 0136 01/14/22 0424 01/17/22 0556 01/18/22 0321 01/19/22 0610  NA 135   < > 137 136 134*  K 3.8   < > 4.5 4.2 4.5  CL 100   < > 97* 94* 94*  CO2 28   < > '27 31 26  '$ GLUCOSE 179*   < > 147* 208* 194*  BUN 39*   < >  33* 36* 42*  CREATININE 1.19   < > 1.31* 1.47* 1.55*  CALCIUM 8.8*   < > 9.0 9.4 9.3  MG 1.6*  --  2.0  --   --   GFRNONAA >60   < > 58* 51* 48*  ANIONGAP 7   < > '13 11 14   '$ < > = values in this interval not displayed.     Lipids  No results for input(s): "CHOL", "TRIG", "HDL", "LABVLDL", "LDLCALC", "CHOLHDL" in the last 168 hours.   Hematology Recent Labs  Lab 01/15/22 0832 01/15/22 1055 01/16/22 0330 01/17/22 0556 01/18/22 0321  WBC 11.3*  --  9.6  --  9.8  RBC 3.57*  --  3.25*  --  3.83*  HGB 10.8*   < > 9.7* 11.5* 11.3*  HCT 33.3*   < > 30.0* 34.8* 35.4*  MCV 93.3  --  92.3  --  92.4  MCH 30.3  --  29.8  --  29.5  MCHC 32.4  --  32.3  --  31.9  RDW 15.8*  --  15.7*  --  15.6*  PLT 255  --  211  --  219   < > = values in this interval not displayed.    Thyroid  No results for input(s): "TSH", "FREET4" in the last 168 hours.   BNP No results for input(s): "BNP", "PROBNP" in the  last 168 hours.   DDimer No results for input(s): "DDIMER" in the last 168 hours.   Radiology    No results found.  Cardiac Studies     Echo from 01/10/22:   1. Left ventricular ejection fraction, by estimation, is 15-20%. The left  ventricle has severely decreased function. The left ventricle demonstrates  global hypokinesis. The left ventricular internal cavity size was mildly  dilated. Left ventricular  diastolic parameters are indeterminate.   2. Right ventricular systolic function is moderately reduced. The right  ventricular size is mildly enlarged. There is mildly elevated pulmonary  artery systolic pressure. The estimated right ventricular systolic  pressure is 09.6 mmHg.   3. The mitral valve is grossly normal. Moderate mitral valve  regurgitation - jet of MR is likely eccentric, and may be underestimated.  No evidence of mitral stenosis.   4. Tricuspid valve regurgitation is moderate.   5. The aortic valve is grossly normal. There is mild calcification of the  aortic valve. Aortic valve regurgitation is not visualized. No aortic  stenosis is present.   6. The inferior vena cava is normal in size with <50% respiratory  variability, suggesting right atrial pressure of 8 mmHg.   Conclusion(s)/Recommendation(s): No left ventricular mural or apical  thrombus/thrombi.   Cath  1.  Significant two-vessel coronary artery disease with chronically occluded mid right coronary artery with left to right as well as bridging collaterals.  The left circumflex is tortuous and diffusely diseased in the midsegment. 2.  Left ventricular angiography was not performed.  EF was severely reduced by echo. 3.  Right heart catheterization showed significantly elevated right and left-sided filling pressures, moderate to severe pulmonary hypertension and moderately reduced cardiac output.   Recommendations: The patient's cardiomyopathy seems to be out of proportion to his coronary artery disease.   PCI of the RCA should be reserved for refractory anginal symptoms.  Recommend medical therapy for now. The still significantly volume overloaded.  I switched furosemide to intravenous 40 mg twice daily and added small dose Entresto and small dose carvedilol.  Patient Profile  71 y.o. male with newly discovered ejection fraction 15 to 20% recent acute pulmonary embolism on heparin IV  Assessment & Plan    Acute systolic heart failure newly diagnosed with a EF 15 to 20% -Cardiomyopathy out of proportion to coronary artery disease as described above. --markedly volume overloaded by heart cath --now on Lasix '60mg'$  IV BID --he put out 3.4 L yesterday and is net -13.7L since admission --Weight is down 4 pounds from yesterday and 14 pounds from admission --Serum creatinine slightly bumped from 1.31->>1.47->>1.55 this morning but he is also status post cath --I think he is near euvolemia --he already got an AM dose of IV lasix so will stop IV lasix and transition to PO '40mg'$  BID starting tomorrow --Continue to monitor renal function, I's and O's and weights while diuresing --Continue carvedilol 3.125 mg twice daily, Jardiance 10 mg daily, Entresto 24-26 mg twice daily and spironolactone 12.5 mg daily --If he goes home today then he needs a BMET on Tuesday in our office --he has an appt in our [facility] clinic on 9/18  ASCAD --Status post cath showing significant two-vessel CAD with chronically occluded mid RCA with left-to-right as well as bridging collaterals and tortuous left circumflex diffusely diseased in the mid segment  -- Felt that cardiomyopathy out of proportion to degree of CAD and PCI should be reserved to RCA for refractory anginal symptoms. -- No aspirin due to DOAC -- Continue carvedilol 3.125 mg twice daily, atorvastatin 80 mg daily  Acute PE - Now on Eliquis for PE treatment.   -- Anticoagulation was held 2 days ago  because of right forearm hematoma post catheterization.  --  Hemoglobin stable 11.3.  Hematoma right forearm noted.  It is marked.  Watch for any signs of worsening.  Currently stable --now back on DOAC  Diabetes with hypertension - Per primary team.  I have spent a total of 35 minutes with patient reviewing cardiac cath, 2D echo, telemetry, EKGs, labs and examining patient as well as establishing an assessment and plan that was discussed with the patient.  > 50% of time was spent in direct patient care.     For questions or updates, please contact [place] Please consult [email or web address] for contact info under        Signed, Fransico Him, MD  01/19/2022, 10:07 AM

## 2022-01-20 ENCOUNTER — Telehealth: Payer: Self-pay

## 2022-01-20 DIAGNOSIS — E119 Type 2 diabetes mellitus without complications: Secondary | ICD-10-CM | POA: Diagnosis not present

## 2022-01-20 DIAGNOSIS — I2699 Other pulmonary embolism without acute cor pulmonale: Secondary | ICD-10-CM | POA: Diagnosis not present

## 2022-01-20 DIAGNOSIS — I5021 Acute systolic (congestive) heart failure: Secondary | ICD-10-CM | POA: Diagnosis not present

## 2022-01-20 DIAGNOSIS — I10 Essential (primary) hypertension: Secondary | ICD-10-CM | POA: Diagnosis not present

## 2022-01-20 LAB — BASIC METABOLIC PANEL
Anion gap: 10 (ref 5–15)
BUN: 40 mg/dL — ABNORMAL HIGH (ref 8–23)
CO2: 27 mmol/L (ref 22–32)
Calcium: 9.2 mg/dL (ref 8.9–10.3)
Chloride: 96 mmol/L — ABNORMAL LOW (ref 98–111)
Creatinine, Ser: 1.76 mg/dL — ABNORMAL HIGH (ref 0.61–1.24)
GFR, Estimated: 41 mL/min — ABNORMAL LOW (ref >60)
Glucose, Bld: 233 mg/dL — ABNORMAL HIGH (ref 70–99)
Potassium: 4.1 mmol/L (ref 3.5–5.1)
Sodium: 133 mmol/L — ABNORMAL LOW (ref 135–145)

## 2022-01-20 LAB — GLUCOSE, CAPILLARY
Glucose-Capillary: 203 mg/dL — ABNORMAL HIGH (ref 70–99)
Glucose-Capillary: 193 mg/dL — ABNORMAL HIGH (ref 70–99)
Glucose-Capillary: 150 mg/dL — ABNORMAL HIGH (ref 70–99)
Glucose-Capillary: 202 mg/dL — ABNORMAL HIGH (ref 70–99)

## 2022-01-20 MED ORDER — INSULIN ASPART 100 UNIT/ML IJ SOLN
0.0000 [IU] | Freq: Three times a day (TID) | INTRAMUSCULAR | Status: DC
  Administered 2022-01-21: 3 [IU] via SUBCUTANEOUS

## 2022-01-20 NOTE — Progress Notes (Signed)
   01/20/22 1430  Mobility  Activity Ambulated independently in hallway  Level of Assistance Independent  Assistive Device None  Distance Ambulated (ft) 550 ft  Activity Response Tolerated well  $Mobility charge 1 Mobility   Mobility Specialist Progress Note  Received pt in bed having no complaints and agreeable to mobility. Pt was asymptomatic throughout ambulation and returned to room w/o fault. Left in EOB w/ call bell in reach and all needs met.   Lucious Groves Mobility Specialist

## 2022-01-20 NOTE — Telephone Encounter (Signed)
-----   Message from Isaiah Serge, NP sent at 01/19/2022 10:26 AM EDT ----- Pt for discharge 01/19/22 and will need BMP on the 12th,  I placed order thanks.

## 2022-01-20 NOTE — Progress Notes (Signed)
Heart Failure Stewardship Pharmacist Progress Note   PCP: Lajean Manes, MD PCP-Cardiologist: Minus Breeding, MD    HPI:  71 yo M with PMH of T2DM, HTN, BPH, peripheral neuropathy, depression, h/o renal mass s/p partial nephrectomy, and previous tobacco use disorder.  He was last in the ED on 8/27 and diagnosed with new PE, but left AMA before he could be transferred to the floor.   He presented to the ED on 8/31 with shortness of breath, edema, and orthopnea. CXR with bibasilar opacities suggestive of PNA vs edema. An ECHO on 9/1 showed LVEF 15-20%, RV moderately reduced, mildly elevated pulmonary pressures, moderate MR and TR. R/LHC on 9/6 with significant two vessel CAD but cardiomyopathy thought to be out of proportion of CAD. Recommended medical therapy and possible PCI of RCA for refractory anginal symptoms. Volume overloaded with RA 15, PA 38, wedge 29, and CO of 4.5, CI 2.2.  Current HF Medications: Beta Blocker: carvedilol 3.125 mg BID MRA: spironolactone 12.5 mg daily SGLT2i: Jardiance 10 mg daily  Prior to admission HF Medications: Diuretic: furosemide 40 mg daily ACE/ARB/ARNI: irbesartan 150 mg daily  Pertinent Lab Values: Serum creatinine 1.76, BUN 40, Potassium 4.1, Sodium 133, Magnesium 2.0, BNP 1972.3, A1c 8.3  Vital Signs: Weight: 182 lbs (admission weight: 198 lbs) Blood pressure: 110/60s  Heart rate: 80-90s  I/O: -2.7L yesterday; net -14.9L  Medication Assistance / Insurance Benefits Check: Does the patient have prescription insurance?  Yes Type of insurance plan: UHC Medicare  Does the patient qualify for medication assistance through manufacturers or grants?   Yes Eligible grants and/or patient assistance programs: Ferne [employer]e, ?Eliquis Medication assistance applications in progress: Ferne Coe, Eliquis  Medication assistance applications approved: none Approved medication assistance renewals will be completed by: PCP [facility] -  pt of Dr. Felipa Eth but will be seen by Dr. Koleen Nimrod after Dr. Fidela Juneau  Outpatient Pharmacy:  Prior to admission outpatient pharmacy: [facility] Is the patient willing to use [place] at discharge? Yes Is the patient willing to transition their outpatient pharmacy to utilize a [facility] outpatient pharmacy?   Pending    Assessment: 1. Acute systolic CHF (LVEF 47-09%), due to NICM (cardiomyopathy out of proportion to CAD). NYHA class II symptoms. - Off IV lasix.Strict I/Os. Daily weights. Keep K>4 and Mag>2 - Continue carvedilol 3.125 mg BID - Off Entresto 24/26 mg BID with AKI (likely in the setting of over-diuresis), consider resuming when renal function improves - Continue spironolactone 12.5 mg daily - Continue Jardiance 10 mg daily    Plan: 1) Medication changes recommended at this time: - Continue current therapy; watch renal function for resuming Entresto  2) Patient assistance: - Currently in the donut hole with his insurance - copays during this time are much higher than usual - Initiated assistance for [employer]. He states that a pharmacist with his PCP is working on [employer] assistance. Called and LVM with their pharmacist office for updates. He is requesting that his PCP will be the responsible party for assistance follow up and renewals. Faxed applications to [phone].  3)  Education  - Patient has been educated on current HF medications and potential additions to HF medication regimen - Patient verbalizes understanding that over the next few months, these medication doses may change and more medications may be added to optimize HF regimen - Patient has been educated on basic disease state pathophysiology and goals of therapy   Kerby Nora, PharmD, BCPS Heart Failure Artist (  [phone]   

## 2022-01-20 NOTE — Progress Notes (Signed)
Progress Note   Patient: Phillip Rich HRC:[id number] DOB: 06/16/1950 DOA: 01/09/2022     11 DOS: the patient was seen and examined on 01/20/2022   Brief hospital course: Mr. Diop was admitted to the hospital with the working diagnosis of decompensated heart failure, complicated with acute pulmonary embolism.   71 yo male with the past medical history of T2DM, hypertension, sp nephrectomy for a renal mass. Rcent diagnosis of pulmonary embolism, and heart failure on 01/05/22, patient left against medical advice but able to take anticoagulation with apixaban and diuretic therapy with furosemide. At home patient had dyspnea on exertion, lower extremity edema, orthopnea and weight gain 11 lbs over the 7 days prior to admission. On his physical examination in the ED his blood pressure was 133/87, HR 109, RR 15 and 02 saturation 100% on room air. Lungs with decreased breath sounds and diffuse rales bilaterally, with no wheezing, heart with S1 and S2 present and rhythmic, abdomen with no distention, ans positive lower extremity edema.   Na 133, K 4,5 Cl 102 bicarbonate 24, glucose 330 bun 45 cr 1,30  BNP 2,249 High sensitive troponin 100, 99  Wbc 11,7 hgb 11,1 plt 366  Sars covid 19 negative   Chest radiograph with cardiomegaly bilateral hilar vascular congestion with cephalization of the vasculature and bilateral small pleural effusions.   EKG 119 bpm, right axis, normal intervals, sinus rhythm, left atrial enlargement, with no significant ST segment or T wave changes.   Patient was placed on furosemide for diuresis.   Echocardiogram with reduced LV systolic function EF 15 to 20%, with global hypokinesis.  09/05 transferred to [facility] for further ischemic work up with cardiac catheterization.   09/06 cardiac catheterization with significant 2 vessel disease.  Right heart catheterization with significant elevation in right and left filling pressures. Moderate to severe pulmonary hypertension and  moderate reduced cardiac output. Right arm hematoma post cath and anticoagulation was held. 09/07 volume status is improving. Hematoma stable, and resumed on anticoagulation with apixaban.  09/08 volume status continue to improve.  09/10 hospitalization has been prolonged due to renal failure    Assessment and Plan: * Acute systolic (congestive) heart failure (HCC) Echocardiogram with reduced LV systolic function with EF 15 to 20%, global hypokinesis, with mild dilatation of internal cavity. Moderate reduction of RV systolic function RVSP 68,0. Moderate mitral valve regurgitation. Moderate TR.   09/06 cardiac catheterization with significant 2 vessel disease.  Right heart catheterization with significant elevation in right and left filling pressures. Moderate to severe pulmonary hypertension and moderate reduced cardiac output.  Acute on chronic core pulmonale.  Urine output is 3,212  ml Systolic blood pressure 248 and 106 mmHg.   Medical therapy with carvedilol. Hold on entresto and loop diuretic therapy due to worsening renal function.  Continue with mpagliflozin, spironolactone  Acute pulmonary embolism (HCC) Hematoma and Hgb are stable, follow up hgb is 11.5   Continue anticoagulation with apixaban.   Acute renal failure (HCC) Hypomagnesemia, hyponatremia   Continue worsening renal function, patient had aggressive diuresis and coronary angiography. Renal function today with serum cr at 1.76, K 4,1 and serum bicarbonate at 27. Na 133.   Continue with spironolactone and SGLT 2 inh. Hold on entresto and loop diuretic therapy Follow up renal function in am, avoid hypotension and nephrotoxic medications.   HTN (hypertension) Continue blood pressure control with carvedilol.  Coronary artery disease Patient with 2 vessel disease  No active chest pain.  Plan to continue with statin  therapy Not on antiplatelet therapy due to full anticoagulation for pulmonary  embolism Positive right upper extremity hematoma   DM2 (diabetes mellitus, type 2) (HCC) Hyperglycemia   Fasting glucose this am 233 mg/dl.  Plan to continue insulin sliding scale for glucose cover and monitoring.  Basal insulin 10 units daily and pre meal 6 units of short acting insulin.   Tobacco abuse Continue smoking cessation         Subjective: Patient with no chest pain or dyspnea, no lower extremity edema   Physical Exam: Vitals:   01/19/22 0945 01/19/22 1640 01/19/22 2131 01/20/22 0613  BP: 109/73 123/75 123/72 106/66  Pulse: 87 87 88 93  Resp: '17 18 16 16  '$ Temp: 97.6 F (36.4 C) (!) 97.4 F (36.3 C) 97.6 F (36.4 C) 97.6 F (36.4 C)  TempSrc: Oral Oral Oral Oral  SpO2: 100% 100% 97% 96%  Weight:    82.8 kg  Height:       Neurology awake and alert ENT with mild pallor Cardiovascular with S1 and S2 present and rhythmic with no gallops, rubs or murmurs Respiratory with no rales or wheezing Abdomen with no distention  No lower extremity edema  Data Reviewed:    Family Communication: no family at the bedside   Disposition: Status is: Inpatient Remains inpatient appropriate because: renal failure   Planned Discharge Destination: Home     Author: Tawni Millers, MD 01/20/2022 12:47 PM  For on call review [email or web address].

## 2022-01-20 NOTE — Inpatient Diabetes Management (Signed)
Inpatient Diabetes Program Recommendations  AACE/ADA: New Consensus Statement on Inpatient Glycemic Control (2015)  Target Ranges:  Prepandial:   less than 140 mg/dL      Peak postprandial:   less than 180 mg/dL (1-2 hours)      Critically ill patients:  140 - 180 mg/dL   Lab Results  Component Value Date   GLUCAP 193 (H) 01/20/2022   HGBA1C 8.3 (H) 01/09/2022    Review of Glycemic Control  Latest Reference Range & Units 01/19/22 16:33 01/19/22 21:28 01/20/22 06:13 01/20/22 11:52  Glucose-Capillary 70 - 99 mg/dL 213 (H) 202 (H) 203 (H) 193 (H)  (H): Data is abnormally high Diabetes history: Type 2 DM Outpatient Diabetes medications: Novolog 70/30 50 units BID, Novolog 12 units TID, Metformin 1000 mg BID, Actos 30 mg QD, Trulicity 1.5 mg qwk Current orders for Inpatient glycemic control: Jardiance 10 mg QD, Semglee 10 units QD, Novolog 6 units TID, Novolog 0-15 units TID & HS  Inpatient Diabetes Program Recommendations:    Consider increasing Semglee 16 units QD.   With acute CHF, consider discontinuing Actos at discharge.   Thanks, Bronson Curb, MSN, RNC-OB Diabetes Coordinator [phone] (8a-5p)

## 2022-01-21 ENCOUNTER — Telehealth: Payer: Self-pay

## 2022-01-21 ENCOUNTER — Other Ambulatory Visit (HOSPITAL_COMMUNITY): Payer: Self-pay

## 2022-01-21 DIAGNOSIS — E669 Obesity, unspecified: Secondary | ICD-10-CM

## 2022-01-21 DIAGNOSIS — I2699 Other pulmonary embolism without acute cor pulmonale: Secondary | ICD-10-CM | POA: Diagnosis not present

## 2022-01-21 DIAGNOSIS — E119 Type 2 diabetes mellitus without complications: Secondary | ICD-10-CM | POA: Diagnosis not present

## 2022-01-21 DIAGNOSIS — I10 Essential (primary) hypertension: Secondary | ICD-10-CM | POA: Diagnosis not present

## 2022-01-21 DIAGNOSIS — I5021 Acute systolic (congestive) heart failure: Secondary | ICD-10-CM | POA: Diagnosis not present

## 2022-01-21 LAB — BASIC METABOLIC PANEL
Anion gap: 9 (ref 5–15)
BUN: 34 mg/dL — ABNORMAL HIGH (ref 8–23)
CO2: 25 mmol/L (ref 22–32)
Calcium: 9.7 mg/dL (ref 8.9–10.3)
Chloride: 99 mmol/L (ref 98–111)
Creatinine, Ser: 1.28 mg/dL — ABNORMAL HIGH (ref 0.61–1.24)
GFR, Estimated: 60 mL/min — ABNORMAL LOW (ref >60)
Glucose, Bld: 172 mg/dL — ABNORMAL HIGH (ref 70–99)
Potassium: 4.4 mmol/L (ref 3.5–5.1)
Sodium: 133 mmol/L — ABNORMAL LOW (ref 135–145)

## 2022-01-21 LAB — POCT I-STAT 7, (LYTES, BLD GAS, ICA,H+H)
Acid-Base Excess: 3 mmol/L — ABNORMAL HIGH (ref 0.0–2.0)
Bicarbonate: 29.1 mmol/L — ABNORMAL HIGH (ref 20.0–28.0)
Calcium, Ion: 1.08 mmol/L — ABNORMAL LOW (ref 1.15–1.40)
HCT: 29 % — ABNORMAL LOW (ref 39.0–52.0)
Hemoglobin: 9.9 g/dL — ABNORMAL LOW (ref 13.0–17.0)
O2 Saturation: 93 %
Potassium: 4.1 mmol/L (ref 3.5–5.1)
Sodium: 136 mmol/L (ref 135–145)
TCO2: 31 mmol/L (ref 22–32)
pCO2 arterial: 48.3 mmHg — ABNORMAL HIGH (ref 32–48)
pH, Arterial: 7.388 (ref 7.35–7.45)
pO2, Arterial: 68 mmHg — ABNORMAL LOW (ref 83–108)

## 2022-01-21 LAB — MAGNESIUM: Magnesium: 2.1 mg/dL (ref 1.7–2.4)

## 2022-01-21 LAB — GLUCOSE, CAPILLARY: Glucose-Capillary: 196 mg/dL — ABNORMAL HIGH (ref 70–99)

## 2022-01-21 MED ORDER — LOSARTAN POTASSIUM 25 MG PO TABS: 25.0000 mg | ORAL_TABLET | Freq: Every day | ORAL | Status: DC

## 2022-01-21 MED ORDER — LOSARTAN POTASSIUM 25 MG PO TABS
25.0000 mg | ORAL_TABLET | Freq: Every day | ORAL | 0 refills | Status: DC
  Filled 2022-01-21: qty 30, 30d supply, fill #0

## 2022-01-21 MED ORDER — FUROSEMIDE 20 MG PO TABS: 20.0000 mg | ORAL_TABLET | Freq: Every day | ORAL | Status: DC

## 2022-01-21 MED ORDER — FUROSEMIDE 20 MG PO TABS
20.0000 mg | ORAL_TABLET | Freq: Every day | ORAL | 0 refills | Status: DC
  Filled 2022-01-21: qty 30, 30d supply, fill #0

## 2022-01-21 NOTE — Care Management Important Message (Signed)
Important Message  Patient Details  Name: Phillip Rich MRN: [id number] Date of Birth: [birth year]   Medicare Important Message Given:  Yes     Shelda Altes 01/21/2022, 8:28 AM

## 2022-01-21 NOTE — Discharge Summary (Addendum)
Physician Discharge Summary   Patient: Phillip Rich MRN: [id number] DOB: 1951-04-20  Admit date:     01/09/2022  Discharge date: 01/21/22  Discharge Physician: Jimmy Picket Leanthony Rhett   PCP: Lajean Manes, MD   Recommendations at discharge:    Patient has been placed on heart failure guideline medical therapy with carvedilol, losartan, spironolactone and empagliflozin Continue diuresis with furosemide 20 mg daily, to increase to 40 mg daily in case of lower extremity edema, worsening dyspnea or weight gain 2 to 3 lbs in 24 hrs or 5 lbs in 7 days.  Plan to start [facility] as outpatient, when renal function more stable.  Continue anticoagulation with apixaban Follow up with Dr Felipa Eth in 7 to 10 days Follow up renal function in 7 days Follow up with cardiology as outpatient as scheduled.   Discharge Diagnoses: Principal Problem:   Acute systolic (congestive) heart failure (HCC) Active Problems:   Acute pulmonary embolism (HCC)   Acute renal failure (HCC)   HTN (hypertension)   Coronary artery disease   DM2 (diabetes mellitus, type 2) (HCC)   Tobacco abuse   Class 1 obesity  Resolved Problems:   * No resolved hospital problems. [facility] Course: Phillip Rich was admitted to the hospital with the working diagnosis of decompensated heart failure, complicated with acute pulmonary embolism.  Prolonged hospitalization due to renal failure.   71 yo male with the past medical history of T2DM, hypertension, sp nephrectomy for a renal mass. Recent diagnosis of pulmonary embolism, and heart failure on 01/05/22, patient left against medical advice but able to take anticoagulation with apixaban and diuretic therapy with furosemide. At home patient had dyspnea on exertion, lower extremity edema, orthopnea and weight gain 11 lbs over the 7 days prior to admission. On his physical examination in the ED his blood pressure was 133/87, HR 109, RR 15 and 02 saturation 100% on room air. Lungs  with decreased breath sounds and diffuse rales bilaterally, with no wheezing, heart with S1 and S2 present and rhythmic, abdomen with no distention, positive lower extremity edema.   Na 133, K 4,5 Cl 102 bicarbonate 24, glucose 330 bun 45 cr 1,30  BNP 2,249 High sensitive troponin 100, 99  Wbc 11,7 hgb 11,1 plt 366  Sars covid 19 negative   Chest radiograph with cardiomegaly bilateral hilar vascular congestion with cephalization of the vasculature and bilateral small pleural effusions.   EKG 119 bpm, right axis, normal intervals, sinus rhythm, left atrial enlargement, with no significant ST segment or T wave changes.   Patient was placed on furosemide for diuresis.   Echocardiogram with reduced LV systolic function EF 15 to 20%, with global hypokinesis.  09/05 transferred from [facility] to [facility] for further ischemic work up with cardiac catheterization.   09/06 cardiac catheterization with significant 2 vessel disease.  Right heart catheterization with significant elevation in right and left filling pressures. Moderate to severe pulmonary hypertension and moderate reduced cardiac output. Right arm hematoma post cath and anticoagulation was held. 09/07 volume status is improving. Hematoma stable, and resumed on anticoagulation with apixaban.  09/08 volume status continue to improve.  09/10 hospitalization has been prolonged due to renal failure  09/12 renal function improving, patient will be discharged home with close follow up as outpatient.    Assessment and Plan: * Acute systolic (congestive) heart failure (HCC) Echocardiogram with reduced LV systolic function with EF 15 to 20%, global hypokinesis, with mild dilatation of internal cavity. Moderate reduction of RV systolic function,  RVSP 44,0. Moderate mitral valve regurgitation. Moderate TR.   09/06 cardiac catheterization with significant 2 vessel disease.  Right heart catheterization with significant elevation in right and left filling  pressures. Moderate to severe pulmonary hypertension and moderate reduced cardiac output.  Acute on chronic core pulmonale.  Patient was placed on furosemide for diuresis, negative fluid balance was achieved, -11,425 ml, with significant improvement in his symptoms.   At the time of his discharge patient will continue taking carvedilol, empagliflozin, spironolactone and losartan Resume diuresis with furosemide 20 mg daily.  Start entresto as outpatient, when renal functio more stable. Follow up with cardiology as outpatient.   Acute pulmonary embolism (HCC) Hematoma and Hgb are stable, follow up hgb is 11.5   Continue anticoagulation with apixaban.   Acute renal failure (HCC) Hypomagnesemia, hyponatremia   Renal function is improving, at the time of his discharge his serum cr is 1,28 with K at 4,4 and serum bicarbonate at 25, Na is 133.   At the time of his discharge patient will continue taking spironolactone and empagliflozin. Resume loop diuretic with furosemide 20 mg daily, increase in case of recurrent volume overload.    HTN (hypertension) Continue blood pressure control with carvedilol and losartan.   Coronary artery disease Patient with 2 vessel disease  No active chest pain.  Plan to continue with statin therapy Not on antiplatelet therapy due to full anticoagulation for pulmonary embolism Positive right upper extremity hematoma   DM2 (diabetes mellitus, type 2) (HCC) Hyperglycemia   Fasting glucose this am 172 mg/dl.  Patient was placed on insulin sliding scale along with basal insulin 10 units daily and pre meal 6 units of short acting nsulin with good toleration. His discharge fasting glucose is 172 mg/dl.   Tobacco abuse Continue smoking cessation   Class 1 obesity Calculated BMI is 27,1          Consultants: cardiology  Procedures performed: cardiac catheterization right and left   Disposition: Home Diet recommendation:  Cardiac and Carb  modified diet DISCHARGE MEDICATION: Allergies as of 01/21/2022       Reactions   Codeine Nausea And Vomiting   Crestor [rosuvastatin Calcium] Other (See Comments)   Leg aches   Erythromycin Rash   All Mycin drugs        Medication List     STOP taking these medications    amLODipine 2.5 MG tablet Commonly known as: NORVASC   Apixaban Starter Pack ('10mg'$  and '5mg'$ ) Commonly known as: ELIQUIS STARTER PACK Replaced by: Eliquis 5 MG Tabs tablet   irbesartan 150 MG tablet Commonly known as: AVAPRO   predniSONE 10 MG tablet Commonly known as: DELTASONE       TAKE these medications    albuterol 108 (90 Base) MCG/ACT inhaler Commonly known as: VENTOLIN HFA Inhale 1-2 puffs into the lungs every 6 (six) hours as needed for wheezing or shortness of breath.   atorvastatin 80 MG tablet Commonly known as: LIPITOR Take 1 tablet (80 mg total) by mouth daily.   carvedilol 3.125 MG tablet Commonly known as: COREG Take 1 tablet (3.125 mg total) by mouth 2 (two) times daily with a meal.   DULoxetine 60 MG capsule Commonly known as: CYMBALTA Take 60 mg by mouth daily.   Eliquis 5 MG Tabs tablet Generic drug: apixaban Take 1 tablet (5 mg total) by mouth 2 (two) times daily. Replaces: Apixaban Starter Pack ('10mg'$  and '5mg'$ )   fluticasone 50 MCG/ACT nasal spray Commonly known as: FLONASE Place 1  spray into both nostrils daily as needed for allergies or rhinitis.   furosemide 20 MG tablet Commonly known as: LASIX Take 1 tablet (20 mg total) by mouth daily. Start taking on: January 22, 2022 What changed: how much to take   HumaLOG KwikPen 100 UNIT/ML KwikPen Generic drug: insulin lispro Inject 12 Units into the skin 2 (two) times daily as needed (high blood sugar).   ipratropium 0.03 % nasal spray Commonly known as: ATROVENT Place 2 sprays into the nose 2 (two) times daily.   Jardiance 10 MG Tabs tablet Generic drug: empagliflozin Take 1 tablet (10 mg total) by mouth  daily.   levocetirizine 5 MG tablet Commonly known as: XYZAL Take 5 mg by mouth daily.   losartan 25 MG tablet Commonly known as: COZAAR Take 1 tablet (25 mg total) by mouth daily. Start taking on: January 22, 2022 What changed: additional instructions   metFORMIN 1000 MG tablet Commonly known as: GLUCOPHAGE Take 1,000 mg by mouth 2 (two) times daily.   omeprazole 20 MG capsule Commonly known as: PRILOSEC Take 1 tablet by mouth every morning.   oxymetazoline 0.05 % nasal spray Commonly known as: AFRIN Place 1 spray into both nostrils 2 (two) times daily as needed for congestion.   Ozempic (0.25 or 0.5 MG/DOSE) 2 MG/3ML Sopn Generic drug: Semaglutide(0.25 or 0.'5MG'$ /DOS) Inject 0.5 mg into the skin once a week.   simvastatin 20 MG tablet Commonly known as: ZOCOR Take 1 tablet by mouth every morning.   spironolactone 25 MG tablet Commonly known as: ALDACTONE Take 0.5 tablets (12.5 mg total) by mouth daily.   vitamin B-12 100 MCG tablet Commonly known as: CYANOCOBALAMIN Take 100 mcg by mouth daily.   VITAMIN D PO Take 1 tablet by mouth every morning.   zolpidem 10 MG tablet Commonly known as: AMBIEN Take 10 mg by mouth at bedtime.        Follow-up Information     [facility]. Go on 01/27/2022.   Specialty: Cardiology Why: at 2:00Pm  please see address above.   Hospital follow up PLEASE bring a current medication list to appointment FREE valet parking, Entrance C, off [employer] information: [address] [id number] [place] [facility] Follow up on 01/21/2022.   Specialty: Cardiology Why: go to Dr. Rosezella [place] office for lab work - you may eat that morning Contact information: [place] [phone]               Discharge Exam: Danley Danker Weights   01/19/22 0419 01/20/22  0613 01/21/22 0549  Weight: 83.5 kg 82.8 kg 83.3 kg   BP (!) 97/57 (BP Location: Right Arm)   Pulse 93   Temp 97.8 F (36.6 C) (Oral)   Resp 16   Ht '5\' 9"'$  (1.753 m)   Wt 83.3 kg Comment: scale A  SpO2 98%   BMI 27.13 kg/m   Patient is feeling better, no dyspnea or chest pain, no edema  Neurology awake and alert ENT with mild pallor Cardiovascular with S1 and S2 present and rhythmic with no gallops, rubs or murmurs Respiratory with no rales or wheezing Abdomen with no distention   Condition at discharge: stable  The results of significant diagnostics from this hospitalization (including imaging, microbiology, ancillary and laboratory) are listed below for reference.   Imaging Studies: CARDIAC CATHETERIZATION  Result Date: 01/15/2022  Ost LM lesion is 20% stenosed.   Prox Cx to Dist Cx lesion is 70% stenosed.   Prox LAD to Mid LAD lesion is 30% stenosed.   Mid RCA lesion is 100% stenosed.   Prox RCA lesion is 70% stenosed. 1.  Significant two-vessel coronary artery disease with chronically occluded mid right coronary artery with left to right as well as bridging collaterals.  The left circumflex is tortuous and diffusely diseased in the midsegment. 2.  Left ventricular angiography was not performed.  EF was severely reduced by echo. 3.  Right heart catheterization showed significantly elevated right and left-sided filling pressures, moderate to severe pulmonary hypertension and moderately reduced cardiac output. Recommendations: The patient's cardiomyopathy seems to be out of proportion to his coronary artery disease.  PCI of the RCA should be reserved for refractory anginal symptoms.  Recommend medical therapy for now. The still significantly volume overloaded.  I switched furosemide to intravenous 40 mg twice daily and added small dose Entresto and small dose carvedilol.   ECHOCARDIOGRAM COMPLETE  Result Date: 01/10/2022    ECHOCARDIOGRAM REPORT   Patient Name:   RIP HAWES Date  of Exam: 01/10/2022 Medical Rec #:  [id number]        Height:       69.0 in Accession #:    [id number]       Weight:       198.7 lb Date of Birth:  1950/10/26        BSA:          2.060 m Patient Age:    56 years         BP:           120/113 mmHg Patient Gender: M                HR:           106 bpm. Exam Location:  Inpatient Procedure: 2D Echo and Intracardiac Opacification Agent Indications:    Dyspnea  History:        Patient has no prior history of Echocardiogram examinations.                 CHF, Signs/Symptoms:Shortness of Breath; Risk                 Factors:Hypertension and Diabetes.  Sonographer:    Harvie Junior Referring Phys: [id number] ERIC J [place] IMPRESSIONS  1. Left ventricular ejection fraction, by estimation, is 15-20%. The left ventricle has severely decreased function. The left ventricle demonstrates global hypokinesis. The left ventricular internal cavity size was mildly dilated. Left ventricular diastolic parameters are indeterminate.  2. Right ventricular systolic function is moderately reduced. The right ventricular size is mildly enlarged. There is mildly elevated pulmonary artery systolic pressure. The estimated right ventricular systolic pressure is 27.7 mmHg.  3. The mitral valve is grossly normal. Moderate mitral valve regurgitation - jet of MR is likely eccentric, and may be underestimated. No evidence of mitral stenosis.  4. Tricuspid valve regurgitation is moderate.  5. The aortic valve is grossly normal. There is mild calcification of the aortic valve. Aortic valve regurgitation is not visualized. No aortic stenosis is present.  6. The inferior vena cava is normal in size with <50% respiratory variability, suggesting right atrial pressure of 8 mmHg. Conclusion(s)/Recommendation(s): No left ventricular mural or apical thrombus/thrombi. FINDINGS  Left Ventricle: Left ventricular ejection fraction, by estimation, is 15-20%. The left ventricle has severely decreased function. The left  ventricle demonstrates global  hypokinesis. The left ventricular internal cavity size was mildly dilated. There is no left ventricular hypertrophy. Left ventricular diastolic parameters are indeterminate. Right Ventricle: The right ventricular size is mildly enlarged. No increase in right ventricular wall thickness. Right ventricular systolic function is moderately reduced. There is mildly elevated pulmonary artery systolic pressure. The tricuspid regurgitant velocity is 3.00 m/s, and with an assumed right atrial pressure of 8 mmHg, the estimated right ventricular systolic pressure is 34.1 mmHg. Left Atrium: Left atrial size was normal in size. Right Atrium: Right atrial size was normal in size. Pericardium: There is no evidence of pericardial effusion. Mitral Valve: The mitral valve is grossly normal. Moderate mitral valve regurgitation. No evidence of mitral valve stenosis. Tricuspid Valve: The tricuspid valve is normal in structure. Tricuspid valve regurgitation is moderate . No evidence of tricuspid stenosis. Aortic Valve: The aortic valve is grossly normal. There is mild calcification of the aortic valve. Aortic valve regurgitation is not visualized. Aortic regurgitation PHT measures 727 msec. No aortic stenosis is present. Aortic valve mean gradient measures 2.0 mmHg. Aortic valve peak gradient measures 4.0 mmHg. Aortic valve area, by VTI measures 2.08 cm. Pulmonic Valve: The pulmonic valve was normal in structure. Pulmonic valve regurgitation is trivial. No evidence of pulmonic stenosis. Aorta: The aortic root is normal in size and structure. Venous: The inferior vena cava is normal in size with less than 50% respiratory variability, suggesting right atrial pressure of 8 mmHg. IAS/Shunts: No atrial level shunt detected by color flow Doppler.  LEFT VENTRICLE PLAX 2D LVIDd:         5.60 cm      Diastology LVIDs:         4.60 cm      LV e' medial:    4.46 cm/s LV PW:         1.00 cm      LV E/e' medial:  22.0 LV  IVS:        1.00 cm      LV e' lateral:   5.22 cm/s LVOT diam:     2.00 cm      LV E/e' lateral: 18.8 LV SV:         30 LV SV Index:   14 LVOT Area:     3.14 cm  LV Volumes (MOD) LV vol d, MOD A2C: 248.0 ml LV vol d, MOD A4C: 198.0 ml LV vol s, MOD A2C: 212.0 ml LV vol s, MOD A4C: 167.0 ml LV SV MOD A2C:     36.0 ml LV SV MOD A4C:     198.0 ml LV SV MOD BP:      31.5 ml RIGHT VENTRICLE RV Basal diam:  4.40 cm RV Mid diam:    4.00 cm RV FAC:         22.8 % LEFT ATRIUM             Index        RIGHT ATRIUM           Index LA diam:        4.20 cm 2.04 cm/m   RA Area:     18.40 cm LA Vol (A2C):   64.0 ml 31.06 ml/m  RA Volume:   58.20 ml  28.25 ml/m LA Vol (A4C):   51.3 ml 24.90 ml/m LA Biplane Vol: 57.4 ml 27.86 ml/m  AORTIC VALVE                    PULMONIC  VALVE AV Area (Vmax):    2.26 cm     PV Vmax:       0.76 m/s AV Area (Vmean):   2.13 cm     PV Peak grad:  2.3 mmHg AV Area (VTI):     2.08 cm AV Vmax:           100.00 cm/s AV Vmean:          70.500 cm/s AV VTI:            0.143 m AV Peak Grad:      4.0 mmHg AV Mean Grad:      2.0 mmHg LVOT Vmax:         71.80 cm/s LVOT Vmean:        47.700 cm/s LVOT VTI:          0.095 m LVOT/AV VTI ratio: 0.66 AI PHT:            727 msec  AORTA Ao Root diam: 3.40 cm Ao Asc diam:  3.40 cm MITRAL VALVE                  TRICUSPID VALVE MV Area (PHT): 4.68 cm       TR Peak grad:   36.0 mmHg MV Decel Time: 162 msec       TR Vmax:        300.00 cm/s MR Peak grad:    66.4 mmHg MR Vmax:         407.50 cm/s  SHUNTS MR PISA:         1.57 cm     Systemic VTI:  0.09 m MR PISA Eff ROA: 10 mm       Systemic Diam: 2.00 cm MR PISA Radius:  0.50 cm MV E velocity: 97.90 cm/s MV A velocity: 54.50 cm/s MV E/A ratio:  1.80 Cherlynn Kaiser MD Electronically signed by Cherlynn Kaiser MD Signature Date/Time: 01/10/2022/5:55:16 PM    Final    VAS [place] LOWER EXTREMITY VENOUS (DVT)  Result Date: 01/10/2022  Lower Venous DVT Study Patient Name:  UNDRA HARRIMAN  Date of Exam:   01/10/2022  Medical Rec #: [id number]         Accession #:    [id number] Date of Birth: March 17, 1951         Patient Gender: M Patient Age:   64 years Exam Location:  [facility] Procedure:      VAS [place] LOWER EXTREMITY VENOUS (DVT) Referring Phys: ERIC [place] --------------------------------------------------------------------------------  Indications: Edema, and pulmonary embolism.  Risk Factors: Confirmed PE. Anticoagulation: Eliquis. Limitations: Poor ultrasound/tissue interface. Comparison Study: No prior studies. Performing Technologist: Oliver Hum RVT  Examination Guidelines: A complete evaluation includes B-mode imaging, spectral Doppler, color Doppler, and power Doppler as needed of all accessible portions of each vessel. Bilateral testing is considered an integral part of a complete examination. Limited examinations for reoccurring indications may be performed as noted. The reflux portion of the exam is performed with the patient in reverse Trendelenburg.  +---------+---------------+---------+-----------+----------+--------------+ RIGHT    CompressibilityPhasicitySpontaneityPropertiesThrombus Aging +---------+---------------+---------+-----------+----------+--------------+ CFV      Full           Yes      Yes                                 +---------+---------------+---------+-----------+----------+--------------+ SFJ      Full                                                        +---------+---------------+---------+-----------+----------+--------------+  FV Prox  Full                                                        +---------+---------------+---------+-----------+----------+--------------+ FV Mid   Full                                                        +---------+---------------+---------+-----------+----------+--------------+ FV DistalFull                                                         +---------+---------------+---------+-----------+----------+--------------+ PFV      Full                                                        +---------+---------------+---------+-----------+----------+--------------+ POP      Full           Yes      Yes                                 +---------+---------------+---------+-----------+----------+--------------+ PTV      Full                                                        +---------+---------------+---------+-----------+----------+--------------+ PERO     Full                                                        +---------+---------------+---------+-----------+----------+--------------+   +---------+---------------+---------+-----------+----------+--------------+ LEFT     CompressibilityPhasicitySpontaneityPropertiesThrombus Aging +---------+---------------+---------+-----------+----------+--------------+ CFV      Full           Yes      Yes                                 +---------+---------------+---------+-----------+----------+--------------+ SFJ      Full                                                        +---------+---------------+---------+-----------+----------+--------------+ FV Prox  Full                                                        +---------+---------------+---------+-----------+----------+--------------+  FV Mid   Full                                                        +---------+---------------+---------+-----------+----------+--------------+ FV DistalFull           Yes      Yes                                 +---------+---------------+---------+-----------+----------+--------------+ PFV      Full                                                        +---------+---------------+---------+-----------+----------+--------------+ POP      Full           Yes      Yes                                  +---------+---------------+---------+-----------+----------+--------------+ PTV      Full                                                        +---------+---------------+---------+-----------+----------+--------------+ PERO     Full                                                        +---------+---------------+---------+-----------+----------+--------------+     Summary: RIGHT: - There is no evidence of deep vein thrombosis in the lower extremity. However, portions of this examination were limited- see technologist comments above.  - No cystic structure found in the popliteal fossa.  LEFT: - There is no evidence of deep vein thrombosis in the lower extremity. However, portions of this examination were limited- see technologist comments above.  - No cystic structure found in the popliteal fossa.  *See table(s) above for measurements and observations. Electronically signed by Servando Snare MD on 01/10/2022 at 3:11:29 PM.    Final    DG Chest 2 View  Result Date: 01/09/2022 CLINICAL DATA:  Short of breath.  Recent pulmonary embolism EXAM: CHEST - 2 VIEW COMPARISON:  Chest CT 01/05/2022, radiograph 01/05/2022 FINDINGS: Stable cardiac silhouette. Low lung volumes. There is bibasilar airspace opacities and small effusions similar comparison exam. There is interval increase in central venous congestion compared to prior. IMPRESSION: *Bibasilar opacities representing pneumonia or pulmonary edema. *Interval increase in central venous congestion. *Low lung volumes and pleural effusions. Electronically Signed   By: Suzy Bouchard M.D.   On: 01/09/2022 13:43   CT Angio Chest PE W and/or Wo Contrast  Addendum Date: 01/05/2022   ADDENDUM REPORT: 01/05/2022 11:38 ADDENDUM: These results were discussed by telephone on 01/05/2022 at 11:38 am with provider Oneal Deputy , who verbally acknowledged these results. Electronically Signed  By: Davina Poke D.O.   On: 01/05/2022 11:38   Result Date:  01/05/2022 CLINICAL DATA:  Pulmonary embolism (PE) suspected, positive D-dimer EXAM: CT ANGIOGRAPHY CHEST WITH CONTRAST TECHNIQUE: Multidetector CT imaging of the chest was performed using the standard protocol during bolus administration of intravenous contrast. Multiplanar CT image reconstructions and MIPs were obtained to evaluate the vascular anatomy. RADIATION DOSE REDUCTION: This exam was performed according to the departmental dose-optimization program which includes automated exposure control, adjustment of the mA and/or kV according to patient size and/or use of iterative reconstruction technique. CONTRAST:  65m OMNIPAQUE IOHEXOL 350 MG/ML SOLN COMPARISON:  CT 07/10/2021 FINDINGS: Cardiovascular: Satisfactory opacification of the pulmonary arteries. Filling defects are seen involving lobar and segmental branch pulmonary arteries of the right lower lobe as well as segmental branch arteries of the right middle lobe (series 6, images 184-205). There is some heterogeneity in the distal right main pulmonary artery which may be secondary to turbulent flow. No saddle embolism. Possible distal left basilar PEs, although evaluation is limited by respiratory motion artifact in the lung bases. RV to LV ratio of less than 1.0. Thoracic aorta is nonaneurysmal. Scattered atherosclerotic vascular calcifications of the aorta and coronary arteries. Heart size upper limits of normal. No pericardial effusion. Mediastinum/Nodes: Abnormally enlarged right hilar lymph node measuring 2.6 cm short axis (series 5, image 77). Enlarged mediastinal lymph nodes including 1.5 cm right paratracheal, 1.6 cm precarinal, and 1.8 cm subcarinal nodes. No axillary lymphadenopathy. Thyroid, trachea, and esophagus within normal limits. Small hiatal hernia. Lungs/Pleura: Moderate layering bilateral pleural effusions. Background of interstitial lung disease with fibrosis. Diffuse bronchial wall thickening with areas of bronchiectasis.  Interlobular septal thickening bilaterally. No pneumothorax. Upper Abdomen: No acute abnormality. Musculoskeletal: No chest wall abnormality. No acute or significant osseous findings. Review of the MIP images confirms the above findings. IMPRESSION: 1. Positive for acute pulmonary embolism involving lobar and segmental branch pulmonary arteries of the right lower lobe as well as segmental branch arteries of the right middle lobe. There is some heterogeneity in the distal right main pulmonary artery which may be secondary to turbulent flow. No saddle embolism. Possible distal left basilar PEs, although evaluation is limited by respiratory motion artifact in the lung bases. No evidence of right heart strain. 2. Moderate layering bilateral pleural effusions with interlobular septal thickening bilaterally, suggesting pulmonary edema. 3. Background of interstitial lung disease with fibrosis. 4. Mediastinal and right hilar lymphadenopathy, nonspecific but may be reactive and/or secondary to underlying interstitial lung disease. Short-term follow-up CT of the chest following resolution of patient's acute symptoms is recommended, preferably with IV contrast. 5. Aortic and coronary artery atherosclerosis (ICD10-I70.0). Ordering provider has been paged. Documentation of the communication of the above findings will be added to the report in the form of an addendum. Electronically Signed: By: NDavina PokeD.O. On: 01/05/2022 11:29   DG Chest 2 View  Result Date: 01/05/2022 CLINICAL DATA:  Shortness of breath for several weeks. History of pulmonary fibrosis. EXAM: CHEST - 2 VIEW COMPARISON:  12/04/2021 and prior studies FINDINGS: Cardiomediastinal silhouette is within normal limits. Bibasilar opacities and trace effusions are noted. Bilateral interstitial opacities are minimally increased. There is no evidence of pneumothorax or acute bony abnormality. IMPRESSION: 1. Slightly increased bilateral interstitial opacities,  which may represent mild edema on chronic lung disease/fibrosis. Trace bilateral pleural effusions. 2. Bibasilar opacities which may represent more focal edema, atelectasis or less likely infection. Electronically Signed   By: JCleatis PolkaD.  On: 01/05/2022 09:45    Microbiology: Results for orders placed or performed during the hospital encounter of 01/05/22  Resp Panel by RT-PCR (Flu A&B, Covid) Anterior Nasal Swab     Status: None   Collection Time: 01/05/22  1:24 PM   Specimen: Anterior Nasal Swab  Result Value Ref Range Status   SARS Coronavirus 2 by RT PCR NEGATIVE NEGATIVE Final    Comment: (NOTE) SARS-CoV-2 target nucleic acids are NOT DETECTED.  The SARS-CoV-2 RNA is generally detectable in upper respiratory specimens during the acute phase of infection. The lowest concentration of SARS-CoV-2 viral copies this assay can detect is 138 copies/mL. A negative result does not preclude SARS-Cov-2 infection and should not be used as the sole basis for treatment or other patient management decisions. A negative result may occur with  improper specimen collection/handling, submission of specimen other than nasopharyngeal swab, presence of viral mutation(s) within the areas targeted by this assay, and inadequate number of viral copies(<138 copies/mL). A negative result must be combined with clinical observations, patient history, and epidemiological information. The expected result is Negative.  Fact Sheet for Patients:  EntrepreneurPulse.com.au  Fact Sheet for Healthcare Providers:  IncredibleEmployment.be  This test is no t yet approved or cleared by the [place] FDA and  has been authorized for detection and/or diagnosis of SARS-CoV-2 by FDA under an Emergency Use Authorization (EUA). This EUA will remain  in effect (meaning this test can be used) for the duration of the COVID-19 declaration under Section 564(b)(1) of the Act,  21 U.S.C.section 360bbb-3(b)(1), unless the authorization is terminated  or revoked sooner.       Influenza A by PCR NEGATIVE NEGATIVE Final   Influenza B by PCR NEGATIVE NEGATIVE Final    Comment: (NOTE) The Xpert Xpress SARS-CoV-2/FLU/RSV plus assay is intended as an aid in the diagnosis of influenza from Nasopharyngeal swab specimens and should not be used as a sole basis for treatment. Nasal washings and aspirates are unacceptable for Xpert Xpress SARS-CoV-2/FLU/RSV testing.  Fact Sheet for Patients: EntrepreneurPulse.com.au  Fact Sheet for Healthcare Providers: IncredibleEmployment.be  This test is not yet approved or cleared by the [place] FDA and has been authorized for detection and/or diagnosis of SARS-CoV-2 by FDA under an Emergency Use Authorization (EUA). This EUA will remain in effect (meaning this test can be used) for the duration of the COVID-19 declaration under Section 564(b)(1) of the Act, 21 U.S.C. section 360bbb-3(b)(1), unless the authorization is terminated or revoked.  Performed at [facility], [address] [place], [place] [address]     Labs: CBC: Recent Labs  Lab 01/15/22 [phone] 01/15/22 1055 01/16/22 0330 01/17/22 0556 01/18/22 0321  WBC 11.3*  --  9.6  --  9.8  HGB 10.8* 10.5* 9.7* 11.5* 11.3*  HCT 33.3* 31.0* 30.0* 34.8* 35.4*  MCV 93.3  --  92.3  --  92.4  PLT 255  --  211  --  865   Basic Metabolic Panel: Recent Labs  Lab 01/17/22 0556 01/18/22 0321 01/19/22 0610 01/20/22 0411 01/21/22 0459  NA 137 136 134* 133* 133*  K 4.5 4.2 4.5 4.1 4.4  CL 97* 94* 94* 96* 99  CO2 '27 31 26 27 25  '$ GLUCOSE 147* 208* 194* 233* 172*  BUN 33* 36* 42* 40* 34*  CREATININE 1.31* 1.47* 1.55* 1.76* 1.28*  CALCIUM 9.0 9.4 9.3 9.2 9.7  MG 2.0  --   --   --  2.1   Liver Function Tests: No  results for input(s): "AST", "ALT", "ALKPHOS", "BILITOT", "PROT", "ALBUMIN" in the last 168  hours. CBG: Recent Labs  Lab 01/20/22 0613 01/20/22 1152 01/20/22 1706 01/20/22 2043 01/21/22 0608  GLUCAP 203* 193* 150* 202* 196*    Discharge time spent: greater than 30 minutes.  Signed: Tawni Millers, MD [facility] 01/21/2022

## 2022-01-21 NOTE — TOC Transition Note (Signed)
Transition of Care [facility]) - CM/SW Discharge Note   Patient Details  Name: Phillip Rich MRN: [id number] Date of Birth: [birth year]  [facility] CM/SW Contact:  Zenon Mayo, RN Phone Number: 01/21/2022, 10:04 AM   Clinical Narrative:    Patient is for dc today, he has no needs.  TOC to fill meds.   Final next level of care: Home/Self Care Barriers to Discharge: Continued Medical Work up   Patient Goals and CMS Choice Patient states their goals for this hospitalization and ongoing recovery are::  (Home)      Discharge Placement                       Discharge Plan and Services   Discharge Planning Services: CM Consult                                 Social Determinants of Health (SDOH) Interventions Food Insecurity Interventions: Intervention Not Indicated Housing Interventions: Intervention Not Indicated Transportation Interventions: Intervention Not Indicated Utilities Interventions: Intervention Not Indicated Alcohol Usage Interventions: Intervention Not Indicated (Score <7) Financial Strain Interventions: Intervention Not Indicated   Readmission Risk Interventions     No data to display

## 2022-01-21 NOTE — Progress Notes (Signed)
Heart Failure Stewardship Pharmacist Progress Note   PCP: Lajean Manes, MD PCP-Cardiologist: Minus Breeding, MD    HPI:  71 yo M with PMH of T2DM, HTN, BPH, peripheral neuropathy, depression, h/o renal mass s/p partial nephrectomy, and previous tobacco use disorder.  He was last in the ED on 8/27 and diagnosed with new PE, but left AMA before he could be transferred to the floor.   He presented to the ED on 8/31 with shortness of breath, edema, and orthopnea. CXR with bibasilar opacities suggestive of PNA vs edema. An ECHO on 9/1 showed LVEF 15-20%, RV moderately reduced, mildly elevated pulmonary pressures, moderate MR and TR. R/LHC on 9/6 with significant two vessel CAD but cardiomyopathy thought to be out of proportion of CAD. Recommended medical therapy and possible PCI of RCA for refractory anginal symptoms. Volume overloaded with RA 15, PA 38, wedge 29, and CO of 4.5, CI 2.2.  Discharge HF Medications: Diuretic: furosemide 20 mg daily Beta Blocker: carvedilol 3.125 mg BID ACE/ARB/ARNI: losartan 25 mg daily MRA: spironolactone 12.5 mg daily SGLT2i: Jardiance 10 mg daily  Prior to admission HF Medications: Diuretic: furosemide 40 mg daily ACE/ARB/ARNI: irbesartan 150 mg daily  Pertinent Lab Values: Serum creatinine 1.28, BUN 34, Potassium 4.4, Sodium 133, Magnesium 2.1, BNP 1972.3, A1c 8.3  Vital Signs: Weight: 183 lbs (admission weight: 198 lbs) Blood pressure: 110/60s  Heart rate: 90s  I/O: -1.2L yesterday; net -14.9L  Medication Assistance / Insurance Benefits Check: Does the patient have prescription insurance?  Yes Type of insurance plan: UHC Medicare  Does the patient qualify for medication assistance through manufacturers or grants?   Yes Eligible grants and/or patient assistance programs: Ferne Coe, ?Eliquis Medication assistance applications in progress: Ferne Coe, Eliquis  Medication assistance applications approved: none Approved  medication assistance renewals will be completed by: PCP [facility] - pt of Dr. Felipa Eth but will be seen by Dr. Koleen Nimrod after Dr. Fidela Juneau  Outpatient Pharmacy:  Prior to admission [facility] Is the patient willing to use [facility] at discharge? Yes Is the patient willing to transition their outpatient pharmacy to utilize a [facility] outpatient pharmacy?   Pending    Assessment: 1. Acute systolic CHF (LVEF 22-29%), due to NICM (cardiomyopathy out of proportion to CAD). NYHA class II symptoms. - Agree with discharging on furosemide 20 mg daily.  - Continue carvedilol 3.125 mg BID - Off Entresto 24/26 mg BID with AKI (likely in the setting of over-diuresis). AKI improved, starting losartan 25 mg daily in the setting of hypotensive events - Continue spironolactone 12.5 mg daily - Continue Jardiance 10 mg daily    Plan: 1) Medication changes recommended at this time: - Continue current therapy; discharge today  2) Patient assistance: - Currently in the donut hole with his insurance - copays during this time are much higher than usual - Initiated assistance for [employer]. He states that a pharmacist with his PCP is working on [employer] assistance. Called and LVM with their pharmacist office for updates. He is requesting that his PCP will be the responsible party for assistance follow up and renewals. Faxed applications to [phone].  3)  Education  - Patient has been educated on current HF medications and potential additions to HF medication regimen - Patient verbalizes understanding that over the next few months, these medication doses may change and more medications may be added to optimize HF regimen - Patient has been educated on basic disease state pathophysiology and goals of  therapy   Kerby Nora, PharmD, BCPS Heart Failure Stewardship Pharmacist Phone [phone]

## 2022-01-21 NOTE — Patient Outreach (Signed)
  Care Coordination   01/21/2022 Name: Phillip Rich MRN: [id number] DOB: 1950-09-30   Care Coordination Outreach Attempts:  An unsuccessful telephone outreach was attempted today to offer the patient information about available care coordination services as a benefit of their health plan.   Follow Up Plan:  Additional outreach attempts will be made to offer the patient care coordination information and services.   Encounter Outcome:  No Answer  Care Coordination Interventions Activated:  No   Care Coordination Interventions:  No, not indicated    [place] [facility] [phone]

## 2022-01-21 NOTE — Progress Notes (Signed)
   01/21/22 0958  Mobility  Activity Ambulated independently in hallway  Level of Assistance Independent  Assistive Device None  Distance Ambulated (ft) 240 ft  Activity Response Tolerated well  $Mobility charge 1 Mobility   Received pt in chair having no complaints and agreeable to mobility. Pt was asymptomatic throughout ambulation and returned to room w/o fault. Left in chair w/ call bell in reach and all needs met.

## 2022-01-21 NOTE — Progress Notes (Signed)
Pt was discharged with belongings and medications from [facility]; picked up on way out from main and taken to [facility] to update meds. Pt was educated by pharmacist with Development worker, community.

## 2022-01-21 NOTE — Assessment & Plan Note (Signed)
Calculated BMI is 27,1

## 2022-01-24 ENCOUNTER — Institutional Professional Consult (permissible substitution): Payer: Medicare Other | Admitting: Student

## 2022-01-26 NOTE — Progress Notes (Incomplete)
HEART & VASCULAR TRANSITION OF CARE CONSULT NOTE     Referring Physician: Primary Care: Primary Cardiologist:  HPI: Referred to clinic by *** for heart failure consultation. 71 y.o. male with history of DM,  HTN and OSA, renal mass s/p nephrectomy. No cardiac history until recently.  He was seen in the ED 08/27 with acute PE and concern for CHF. He left AMA and returned on 08/31 with worsening dyspnea. Had noted progressive dyspnea, orthopnea, LE edema and weight gain after recent travel to [place].  BNP elevated at 2249. ECG with sinus tachycardia. He was admitted for further workup and management. Cardiology was consulted.  Echo: EF 25-20%, RV moderately reduced, RVSP 44 mmHg, moderate MR< moderate TR  R/LHC with CTO RCA, diffusely diseased mLCx (CAD out of proportion to degree of LV dysfunction), significantly elevated right and left sided filling pressures, moderate to severe pulmonary hypertension, moderately reduced CO.  He diuresed with IV lasix and was started on GDMT.   Cardiac Testing    Review of Systems: [y] = yes, '[ ]'$  = no   General: Weight gain '[ ]'$ ; Weight loss '[ ]'$ ; Anorexia '[ ]'$ ; Fatigue '[ ]'$ ; Fever '[ ]'$ ; Chills '[ ]'$ ; Weakness '[ ]'$   Cardiac: Chest pain/pressure '[ ]'$ ; Resting SOB '[ ]'$ ; Exertional SOB '[ ]'$ ; Orthopnea '[ ]'$ ; Pedal Edema '[ ]'$ ; Palpitations '[ ]'$ ; Syncope '[ ]'$ ; Presyncope '[ ]'$ ; Paroxysmal nocturnal dyspnea'[ ]'$   Pulmonary: Cough '[ ]'$ ; Wheezing'[ ]'$ ; Hemoptysis'[ ]'$ ; Sputum '[ ]'$ ; Snoring '[ ]'$   GI: Vomiting'[ ]'$ ; Dysphagia'[ ]'$ ; Melena'[ ]'$ ; Hematochezia '[ ]'$ ; Heartburn'[ ]'$ ; Abdominal pain '[ ]'$ ; Constipation '[ ]'$ ; Diarrhea '[ ]'$ ; BRBPR '[ ]'$   GU: Hematuria'[ ]'$ ; Dysuria '[ ]'$ ; Nocturia'[ ]'$   Vascular: Pain in legs with walking '[ ]'$ ; Pain in feet with lying flat '[ ]'$ ; Non-healing sores '[ ]'$ ; Stroke '[ ]'$ ; TIA '[ ]'$ ; Slurred speech '[ ]'$ ;  Neuro: Headaches'[ ]'$ ; Vertigo'[ ]'$ ; Seizures'[ ]'$ ; Paresthesias'[ ]'$ ;Blurred vision '[ ]'$ ; Diplopia '[ ]'$ ; Vision changes '[ ]'$   Ortho/Skin: Arthritis '[ ]'$ ; Joint pain '[ ]'$ ; Muscle pain [  ]; Joint swelling '[ ]'$ ; Back Pain '[ ]'$ ; Rash '[ ]'$   Psych: Depression'[ ]'$ ; Anxiety'[ ]'$   Heme: Bleeding problems '[ ]'$ ; Clotting disorders '[ ]'$ ; Anemia '[ ]'$   Endocrine: Diabetes '[ ]'$ ; Thyroid dysfunction'[ ]'$    Past Medical History:  Diagnosis Date   Anxiety    BPH (benign prostatic hyperplasia)    Complication of anesthesia    Diabetes mellitus    DJD (degenerative joint disease)    GERD (gastroesophageal reflux disease)    HTN (hypertension)    Hyperlipidemia    ILD (interstitial lung disease) (HCC)    Left renal mass    Melanoma (HCC)    Mild sleep apnea    Sleep study pending   PONV (postoperative nausea and vomiting)     Current Outpatient Medications  Medication Sig Dispense Refill   albuterol (VENTOLIN HFA) 108 (90 Base) MCG/ACT inhaler Inhale 1-2 puffs into the lungs every 6 (six) hours as needed for wheezing or shortness of breath. 1 each 0   apixaban (ELIQUIS) 5 MG TABS tablet Take 1 tablet (5 mg total) by mouth 2 (two) times daily. 60 tablet 0   atorvastatin (LIPITOR) 80 MG tablet Take 1 tablet (80 mg total) by mouth daily. 30 tablet 0   carvedilol (COREG) 3.125 MG tablet Take 1 tablet (3.125 mg total) by mouth 2 (two) times daily with a meal. 60 tablet 0  Cholecalciferol (VITAMIN D PO) Take 1 tablet by mouth every morning.     DULoxetine (CYMBALTA) 60 MG capsule Take 60 mg by mouth daily.     empagliflozin (JARDIANCE) 10 MG TABS tablet Take 1 tablet (10 mg total) by mouth daily. 30 tablet 0   fluticasone (FLONASE) 50 MCG/ACT nasal spray Place 1 spray into both nostrils daily as needed for allergies or rhinitis.     furosemide (LASIX) 20 MG tablet Take 1 tablet (20 mg total) by mouth daily. 30 tablet 0   insulin lispro (HUMALOG KWIKPEN) 100 UNIT/ML KwikPen Inject 12 Units into the skin 2 (two) times daily as needed (high blood sugar).     ipratropium (ATROVENT) 0.03 % nasal spray Place 2 sprays into the nose 2 (two) times daily. 30 mL 0   levocetirizine (XYZAL) 5 MG tablet Take 5  mg by mouth daily.     losartan (COZAAR) 25 MG tablet Take 1 tablet (25 mg total) by mouth daily. 30 tablet 0   metFORMIN (GLUCOPHAGE) 1000 MG tablet Take 1,000 mg by mouth 2 (two) times daily.      omeprazole (PRILOSEC) 20 MG capsule Take 1 tablet by mouth every morning.      oxymetazoline (AFRIN) 0.05 % nasal spray Place 1 spray into both nostrils 2 (two) times daily as needed for congestion.     Semaglutide,0.25 or 0.'5MG'$ /DOS, (OZEMPIC, 0.25 OR 0.5 MG/DOSE,) 2 MG/3ML SOPN Inject 0.5 mg into the skin once a week.     simvastatin (ZOCOR) 20 MG tablet Take 1 tablet by mouth every morning.      spironolactone (ALDACTONE) 25 MG tablet Take 0.5 tablets (12.5 mg total) by mouth daily. 15 tablet 0   vitamin B-12 (CYANOCOBALAMIN) 100 MCG tablet Take 100 mcg by mouth daily.     zolpidem (AMBIEN) 10 MG tablet Take 10 mg by mouth at bedtime.     No current facility-administered medications for this visit.    Allergies  Allergen Reactions   Codeine Nausea And Vomiting   Crestor [Rosuvastatin Calcium] Other (See Comments)    Leg aches   Erythromycin Rash    All Mycin drugs      Social History   Socioeconomic History   Marital status: Single    Spouse name: Not on file   Number of children: 0   Years of education: Not on file   Highest education level: High school graduate  Occupational History   Occupation: retired  Tobacco Use   Smoking status: Former    Packs/day: 1.00    Years: 50.00    Total pack years: 50.00    Types: Cigarettes    Quit date: 10/16/2021    Years since quitting: 0.2   Smokeless tobacco: Never  Vaping Use   Vaping Use: Never used  Substance and Sexual Activity   Alcohol use: Yes    Comment: rare occ.   Drug use: Yes    Types: Marijuana    Comment: 07/29/21 3 x week to help sleep   Sexual activity: Not on file  Other Topics Concern   Not on file  Social History Narrative   Lives alone.  Divorced.  Retired Sport and exercise psychologist.       Social Determinants of  Health   Financial Resource Strain: Medium Risk (01/16/2022)   Overall Financial Resource Strain (CARDIA)    Difficulty of Paying Living Expenses: Somewhat hard  Food Insecurity: No Food Insecurity (01/16/2022)   Hunger Vital Sign    Worried About Running  Out of Food in the Last Year: Never true    Ran Out of Food in the Last Year: Never true  Transportation Needs: No Transportation Needs (01/16/2022)   PRAPARE - Hydrologist (Medical): No    Lack of Transportation (Non-Medical): No  Physical Activity: Not on file  Stress: Not on file  Social Connections: Not on file  Intimate Partner Violence: Not on file      Family History  Problem Relation Age of Onset   Lung cancer Mother    Heart disease Father    Asthma Sister    Asthma Sister    Colon cancer Sister    Rheum arthritis Sister        x3    There were no vitals filed for this visit.  PHYSICAL EXAM: General:  Well appearing. No respiratory difficulty HEENT: normal Neck: supple. no JVD. Carotids 2+ bilat; no bruits. No lymphadenopathy or thryomegaly appreciated. Cor: PMI nondisplaced. Regular rate & rhythm. No rubs, gallops or murmurs. Lungs: clear Abdomen: soft, nontender, nondistended. No hepatosplenomegaly. No bruits or masses. Good bowel sounds. Extremities: no cyanosis, clubbing, rash, edema Neuro: alert & oriented x 3, cranial nerves grossly intact. moves all 4 extremities w/o difficulty. Affect pleasant.  ECG:   ASSESSMENT & PLAN:  Chronic systolic CHF  Coronary artery disease  DM II  Recent PE   NYHA *** GDMT  Diuretic- BB- Ace/ARB/ARNI MRA SGLT2i    Referred to HFSW (PCP, Medications, Transportation, ETOH Abuse, Drug Abuse, Insurance, Financial ): Yes or No Refer to Pharmacy: Yes or No Refer to Home Health: Yes on No Refer to Advanced Heart Failure Clinic: Yes or no  Refer to General Cardiology: Yes or No  Follow up

## 2022-01-27 ENCOUNTER — Encounter (HOSPITAL_COMMUNITY): Payer: Self-pay

## 2022-01-27 ENCOUNTER — Telehealth (HOSPITAL_COMMUNITY): Payer: Self-pay | Admitting: *Deleted

## 2022-01-27 ENCOUNTER — Ambulatory Visit (HOSPITAL_COMMUNITY)
Admit: 2022-01-27 | Discharge: 2022-01-27 | Disposition: A | Discharge status: Home / Self Care | Payer: Medicare Other | Attending: Physician Assistant | Admitting: Physician Assistant

## 2022-01-27 VITALS — BP 108/66 | HR 86 | Wt 186.0 lb

## 2022-01-27 DIAGNOSIS — E1165 Type 2 diabetes mellitus with hyperglycemia: Secondary | ICD-10-CM | POA: Diagnosis not present

## 2022-01-27 DIAGNOSIS — Z7901 Long term (current) use of anticoagulants: Secondary | ICD-10-CM | POA: Diagnosis not present

## 2022-01-27 DIAGNOSIS — Z905 Acquired absence of kidney: Secondary | ICD-10-CM | POA: Insufficient documentation

## 2022-01-27 DIAGNOSIS — Z79899 Other long term (current) drug therapy: Secondary | ICD-10-CM | POA: Diagnosis not present

## 2022-01-27 DIAGNOSIS — G4733 Obstructive sleep apnea (adult) (pediatric): Secondary | ICD-10-CM | POA: Insufficient documentation

## 2022-01-27 DIAGNOSIS — Z86711 Personal history of pulmonary embolism: Secondary | ICD-10-CM | POA: Insufficient documentation

## 2022-01-27 DIAGNOSIS — R590 Localized enlarged lymph nodes: Secondary | ICD-10-CM | POA: Diagnosis not present

## 2022-01-27 DIAGNOSIS — Z794 Long term (current) use of insulin: Secondary | ICD-10-CM | POA: Insufficient documentation

## 2022-01-27 DIAGNOSIS — J449 Chronic obstructive pulmonary disease, unspecified: Secondary | ICD-10-CM | POA: Insufficient documentation

## 2022-01-27 DIAGNOSIS — Z7985 Long-term (current) use of injectable non-insulin antidiabetic drugs: Secondary | ICD-10-CM | POA: Insufficient documentation

## 2022-01-27 DIAGNOSIS — E119 Type 2 diabetes mellitus without complications: Secondary | ICD-10-CM | POA: Diagnosis not present

## 2022-01-27 DIAGNOSIS — Z7984 Long term (current) use of oral hypoglycemic drugs: Secondary | ICD-10-CM | POA: Insufficient documentation

## 2022-01-27 DIAGNOSIS — I110 Hypertensive heart disease with heart failure: Secondary | ICD-10-CM | POA: Insufficient documentation

## 2022-01-27 DIAGNOSIS — I5022 Chronic systolic (congestive) heart failure: Secondary | ICD-10-CM | POA: Diagnosis not present

## 2022-01-27 DIAGNOSIS — I2510 Atherosclerotic heart disease of native coronary artery without angina pectoris: Secondary | ICD-10-CM | POA: Diagnosis not present

## 2022-01-27 DIAGNOSIS — I2582 Chronic total occlusion of coronary artery: Secondary | ICD-10-CM | POA: Insufficient documentation

## 2022-01-27 DIAGNOSIS — Z87891 Personal history of nicotine dependence: Secondary | ICD-10-CM | POA: Insufficient documentation

## 2022-01-27 DIAGNOSIS — J8410 Pulmonary fibrosis, unspecified: Secondary | ICD-10-CM | POA: Insufficient documentation

## 2022-01-27 DIAGNOSIS — I2694 Multiple subsegmental pulmonary emboli without acute cor pulmonale: Secondary | ICD-10-CM

## 2022-01-27 LAB — BRAIN NATRIURETIC PEPTIDE: B Natriuretic Peptide: 718.6 pg/mL — ABNORMAL HIGH (ref 0.0–100.0)

## 2022-01-27 LAB — BASIC METABOLIC PANEL
Anion gap: 8 (ref 5–15)
BUN: 32 mg/dL — ABNORMAL HIGH (ref 8–23)
CO2: 24 mmol/L (ref 22–32)
Calcium: 9.5 mg/dL (ref 8.9–10.3)
Chloride: 102 mmol/L (ref 98–111)
Creatinine, Ser: 1.09 mg/dL (ref 0.61–1.24)
GFR, Estimated: >60 mL/min (ref >60)
Glucose, Bld: 141 mg/dL — ABNORMAL HIGH (ref 70–99)
Potassium: 5.2 mmol/L — ABNORMAL HIGH (ref 3.5–5.1)
Sodium: 134 mmol/L — ABNORMAL LOW (ref 135–145)

## 2022-01-27 MED ORDER — SPIRONOLACTONE 25 MG PO TABS: 25.0000 mg | ORAL_TABLET | Freq: Every day | ORAL | 6 refills | Status: DC

## 2022-01-27 NOTE — Telephone Encounter (Signed)
-----   Message from Joette Catching, [place] sent at 01/27/2022  4:15 PM EDT ----- K is elevated. We increased spiro from 12.5 to 25 mg daily at today's visit. He should stop spiro instead. BMET in 1 week.

## 2022-01-27 NOTE — Telephone Encounter (Signed)
Spoke w/pt, he is aware, agreeable, and verbalized understanding, already sch for labs 9/25 and will keep that appt, med list updated

## 2022-01-27 NOTE — Telephone Encounter (Signed)
Called to confirm Heart & Vascular Transitions of Care appointment at 2 pm on 01/27/2022. Patient reminded to bring all medications and pill box organizer with them. Confirmed patient has transportation. Gave directions, instructed to utilize [facility] parking.  Confirmed appointment prior to ending call.    Earnestine Leys, BSN, Clinical cytogeneticist Only

## 2022-01-27 NOTE — Patient Instructions (Signed)
Medication Changes:  Increase Spironolactone to 25 mg (1 tab) daily  Lab Work:  Labs done today, we will call you for abnormal results  Your physician recommends that you return for lab work in: 1 week  Testing/Procedures:  Your physician has requested that you have a cardiac MRI. Cardiac MRI uses a computer to create images of your heart as its beating, producing both still and moving pictures of your heart and major blood vessels. For further information please visit [email or web address] Please follow the instruction sheet given to you today for more information. RADIOLOGY WILL CALL YOU TO SCHEDULE THIS  Referrals:  none  Special Instructions // Education:  Do the following things EVERYDAY: Weigh yourself in the morning before breakfast. Write it down and keep it in a log. Take your medicines as prescribed Eat low salt foods--Limit salt (sodium) to 2000 mg per day.  Stay as active as you can everyday Limit all fluids for the day to less than 2 liters   Follow-Up in: 3-4 weeks  At the [facility], you and your health needs are our priority. We have a designated team specialized in the treatment of Heart Failure. This Care Team includes your primary Heart Failure Specialized Cardiologist (physician), Advanced Practice Providers (APPs- Physician Assistants and Nurse Practitioners), and Pharmacist who all work together to provide you with the care you need, when you need it.   You may see any of the following providers on your designated Care Team at your next follow up:  Dr. Glori Bickers Dr. Loralie Champagne Dr. Roxana Hires, NP Lyda Jester, [place] [facility] [place], [place] Forestine Na, NP Audry Riles, PharmD   Please be sure to bring in all your medications bottles to every appointment.   Need to Contact us:  If you have any questions or concerns before your next appointment please send [place] a message through [place] or call  our office at [phone].    TO LEAVE A MESSAGE FOR THE NURSE SELECT OPTION 2, PLEASE LEAVE A MESSAGE INCLUDING: YOUR NAME DATE OF BIRTH CALL BACK NUMBER REASON FOR CALL**this is important as we prioritize the call backs  YOU WILL RECEIVE A CALL BACK THE SAME DAY AS LONG AS YOU CALL BEFORE 4:00 PM

## 2022-01-28 NOTE — Addendum Note (Signed)
Addended by: Scarlette Calico on: 01/28/2022 10:56 AM   Modules accepted: Orders

## 2022-01-28 NOTE — Telephone Encounter (Signed)
Per Marlyce Huge, PA pt was d/c'd from hospital on Atorva 80 but was also already on Simva 20, she advised to ensure pt stops Simva and continues Atorva  Called and spoke w/pt, he is aware, he states he has been taking both meds, will stop Simva, med list updated

## 2022-01-29 ENCOUNTER — Institutional Professional Consult (permissible substitution): Payer: Medicare Other | Admitting: Neurology

## 2022-01-29 DIAGNOSIS — I10 Essential (primary) hypertension: Secondary | ICD-10-CM | POA: Diagnosis not present

## 2022-01-29 DIAGNOSIS — I5022 Chronic systolic (congestive) heart failure: Secondary | ICD-10-CM | POA: Diagnosis not present

## 2022-02-03 ENCOUNTER — Ambulatory Visit (HOSPITAL_COMMUNITY)
Admission: RE | Admit: 2022-02-03 | Discharge: 2022-02-03 | Disposition: A | Discharge status: Home / Self Care | Payer: Medicare Other | Source: Ambulatory Visit | Attending: Cardiology | Admitting: Cardiology

## 2022-02-03 DIAGNOSIS — I5022 Chronic systolic (congestive) heart failure: Secondary | ICD-10-CM | POA: Insufficient documentation

## 2022-02-03 LAB — BASIC METABOLIC PANEL
Anion gap: 11 (ref 5–15)
BUN: 26 mg/dL — ABNORMAL HIGH (ref 8–23)
CO2: 19 mmol/L — ABNORMAL LOW (ref 22–32)
Calcium: 9.4 mg/dL (ref 8.9–10.3)
Chloride: 107 mmol/L (ref 98–111)
Creatinine, Ser: 1.05 mg/dL (ref 0.61–1.24)
GFR, Estimated: >60 mL/min (ref >60)
Glucose, Bld: 135 mg/dL — ABNORMAL HIGH (ref 70–99)
Potassium: 5.1 mmol/L (ref 3.5–5.1)
Sodium: 137 mmol/L (ref 135–145)

## 2022-02-24 DIAGNOSIS — L814 Other melanin hyperpigmentation: Secondary | ICD-10-CM | POA: Diagnosis not present

## 2022-02-24 DIAGNOSIS — L570 Actinic keratosis: Secondary | ICD-10-CM | POA: Diagnosis not present

## 2022-02-24 DIAGNOSIS — D2371 Other benign neoplasm of skin of right lower limb, including hip: Secondary | ICD-10-CM | POA: Diagnosis not present

## 2022-02-24 DIAGNOSIS — L821 Other seborrheic keratosis: Secondary | ICD-10-CM | POA: Diagnosis not present

## 2022-02-24 DIAGNOSIS — D2372 Other benign neoplasm of skin of left lower limb, including hip: Secondary | ICD-10-CM | POA: Diagnosis not present

## 2022-02-26 ENCOUNTER — Telehealth: Payer: Self-pay

## 2022-02-26 ENCOUNTER — Encounter (HOSPITAL_COMMUNITY): Payer: Medicare Other | Admitting: Cardiology

## 2022-02-26 NOTE — Patient Outreach (Signed)
  Care Coordination   02/26/2022 Name: Phillip Rich MRN: [id number] DOB: 1951-04-04   Care Coordination Outreach Attempts:  A second unsuccessful outreach was attempted today to offer the patient with information about available care coordination services as a benefit of their health plan.     Follow Up Plan:  Additional outreach attempts will be made to offer the patient care coordination information and services.   Encounter Outcome:  No Answer  Care Coordination Interventions Activated:  No   Care Coordination Interventions:  No, not indicated    [facility] [phone]

## 2022-02-28 ENCOUNTER — Ambulatory Visit (HOSPITAL_COMMUNITY)
Admission: RE | Admit: 2022-02-28 | Discharge: 2022-02-28 | Disposition: A | Discharge status: Home / Self Care | Payer: Medicare Other | Source: Ambulatory Visit | Attending: Cardiology | Admitting: Cardiology

## 2022-02-28 ENCOUNTER — Telehealth (HOSPITAL_COMMUNITY): Payer: Self-pay

## 2022-02-28 ENCOUNTER — Other Ambulatory Visit (HOSPITAL_COMMUNITY): Payer: Self-pay

## 2022-02-28 VITALS — BP 140/82 | HR 105 | Wt 193.8 lb

## 2022-02-28 DIAGNOSIS — Z794 Long term (current) use of insulin: Secondary | ICD-10-CM

## 2022-02-28 DIAGNOSIS — I2510 Atherosclerotic heart disease of native coronary artery without angina pectoris: Secondary | ICD-10-CM | POA: Diagnosis not present

## 2022-02-28 DIAGNOSIS — E1165 Type 2 diabetes mellitus with hyperglycemia: Secondary | ICD-10-CM | POA: Diagnosis not present

## 2022-02-28 DIAGNOSIS — I2694 Multiple subsegmental pulmonary emboli without acute cor pulmonale: Secondary | ICD-10-CM | POA: Diagnosis not present

## 2022-02-28 DIAGNOSIS — R9431 Abnormal electrocardiogram [ECG] [EKG]: Secondary | ICD-10-CM | POA: Insufficient documentation

## 2022-02-28 DIAGNOSIS — I5022 Chronic systolic (congestive) heart failure: Secondary | ICD-10-CM | POA: Insufficient documentation

## 2022-02-28 LAB — BRAIN NATRIURETIC PEPTIDE: B Natriuretic Peptide: 1508.5 pg/mL — ABNORMAL HIGH (ref 0.0–100.0)

## 2022-02-28 LAB — BASIC METABOLIC PANEL
Anion gap: 12 (ref 5–15)
BUN: 17 mg/dL (ref 8–23)
CO2: 22 mmol/L (ref 22–32)
Calcium: 9 mg/dL (ref 8.9–10.3)
Chloride: 100 mmol/L (ref 98–111)
Creatinine, Ser: 0.88 mg/dL (ref 0.61–1.24)
GFR, Estimated: >60 mL/min (ref >60)
Glucose, Bld: 264 mg/dL — ABNORMAL HIGH (ref 70–99)
Potassium: 4.4 mmol/L (ref 3.5–5.1)
Sodium: 134 mmol/L — ABNORMAL LOW (ref 135–145)

## 2022-02-28 MED ORDER — SPIRONOLACTONE 25 MG PO TABS: 12.5000 mg | ORAL_TABLET | Freq: Every day | ORAL | 3 refills | Status: DC

## 2022-02-28 MED ORDER — ENTRESTO 24-26 MG PO TABS: 1.0000 | ORAL_TABLET | Freq: Two times a day (BID) | ORAL | 11 refills | Status: DC

## 2022-02-28 MED ORDER — EMPAGLIFLOZIN 10 MG PO TABS: 10.0000 mg | ORAL_TABLET | Freq: Every day | ORAL | 11 refills | Status: DC

## 2022-02-28 MED ORDER — CARVEDILOL 6.25 MG PO TABS: 6.2500 mg | ORAL_TABLET | Freq: Two times a day (BID) | ORAL | 3 refills | Status: DC

## 2022-02-28 NOTE — Progress Notes (Incomplete)
ADVANCED HEART FAILURE CLINIC NOTE  Referring Physician: Lajean Manes, MD  Primary Care: Lajean Manes, MD Primary Cardiologist:  HPI: Phillip Rich is a 71 y.o. male    Activity level/exercise tolerance:  *** Orthopnea:  Sleeps on *** pillows Paroxysmal noctural dyspnea:  *** Chest pain/pressure:  *** Orthostatic lightheadedness:  *** Palpitations:  *** Lower extremity edema:  *** Presyncope/syncope:  *** Cough:  ***  Past Medical History:  Diagnosis Date   Anxiety    BPH (benign prostatic hyperplasia)    Complication of anesthesia    Diabetes mellitus    DJD (degenerative joint disease)    GERD (gastroesophageal reflux disease)    HTN (hypertension)    Hyperlipidemia    ILD (interstitial lung disease) (HCC)    Left renal mass    Melanoma (HCC)    Mild sleep apnea    Sleep study pending   PONV (postoperative nausea and vomiting)     Current Outpatient Medications  Medication Sig Dispense Refill   albuterol (VENTOLIN HFA) 108 (90 Base) MCG/ACT inhaler Inhale 1-2 puffs into the lungs every 6 (six) hours as needed for wheezing or shortness of breath. 1 each 0   Cholecalciferol (VITAMIN D PO) Take 1 tablet by mouth every morning.     DULoxetine (CYMBALTA) 60 MG capsule Take 60 mg by mouth daily.     fluticasone (FLONASE) 50 MCG/ACT nasal spray Place 1 spray into both nostrils daily as needed for allergies or rhinitis.     furosemide (LASIX) 20 MG tablet Take 1 tablet (20 mg total) by mouth daily. 30 tablet 0   insulin lispro (HUMALOG KWIKPEN) 100 UNIT/ML KwikPen Inject 12 Units into the skin 2 (two) times daily as needed (high blood sugar).     ipratropium (ATROVENT) 0.03 % nasal spray Place 2 sprays into the nose 2 (two) times daily. 30 mL 0   levocetirizine (XYZAL) 5 MG tablet Take 5 mg by mouth daily.     metFORMIN (GLUCOPHAGE) 1000 MG tablet Take 1,000 mg by mouth 2 (two) times daily.      omeprazole (PRILOSEC) 20 MG capsule Take 1 tablet by mouth every  morning.      oxymetazoline (AFRIN) 0.05 % nasal spray Place 1 spray into both nostrils 2 (two) times daily as needed for congestion.     Semaglutide,0.25 or 0.'5MG'$ /DOS, (OZEMPIC, 0.25 OR 0.5 MG/DOSE,) 2 MG/3ML SOPN Inject 0.5 mg into the skin once a week.     vitamin B-12 (CYANOCOBALAMIN) 100 MCG tablet Take 100 mcg by mouth daily.     VITAMIN D PO Take by mouth every morning.     zolpidem (AMBIEN) 10 MG tablet Take 10 mg by mouth at bedtime.     apixaban (ELIQUIS) 5 MG TABS tablet Take 1 tablet (5 mg total) by mouth 2 (two) times daily. 60 tablet 0   atorvastatin (LIPITOR) 80 MG tablet Take 1 tablet (80 mg total) by mouth daily. 30 tablet 0   carvedilol (COREG) 3.125 MG tablet Take 1 tablet (3.125 mg total) by mouth 2 (two) times daily with a meal. 60 tablet 0   losartan (COZAAR) 25 MG tablet Take 1 tablet (25 mg total) by mouth daily. 30 tablet 0   No current facility-administered medications for this encounter.    Allergies  Allergen Reactions   Codeine Nausea And Vomiting   Crestor [Rosuvastatin Calcium] Other (See Comments)    Leg aches   Erythromycin Rash    All Mycin drugs  Social History   Socioeconomic History   Marital status: Single    Spouse name: Not on file   Number of children: 0   Years of education: Not on file   Highest education level: High school graduate  Occupational History   Occupation: retired  Tobacco Use   Smoking status: Former    Packs/day: 1.00    Years: 50.00    Total pack years: 50.00    Types: Cigarettes    Quit date: 10/16/2021    Years since quitting: 0.3   Smokeless tobacco: Never  Vaping Use   Vaping Use: Never used  Substance and Sexual Activity   Alcohol use: Yes    Comment: rare occ.   Drug use: Yes    Types: Marijuana    Comment: 07/29/21 3 x week to help sleep   Sexual activity: Not on file  Other Topics Concern   Not on file  Social History Narrative   Lives alone.  Divorced.  Retired Sport and exercise psychologist.        Social Determinants of Health   Financial Resource Strain: Medium Risk (01/16/2022)   Overall Financial Resource Strain (CARDIA)    Difficulty of Paying Living Expenses: Somewhat hard  Food Insecurity: No Food Insecurity (01/16/2022)   Hunger Vital Sign    Worried About Running Out of Food in the Last Year: Never true    Ran Out of Food in the Last Year: Never true  Transportation Needs: No Transportation Needs (01/16/2022)   PRAPARE - Hydrologist (Medical): No    Lack of Transportation (Non-Medical): No  Physical Activity: Not on file  Stress: Not on file  Social Connections: Not on file  Intimate Partner Violence: Not on file      Family History  Problem Relation Age of Onset   Lung cancer Mother    Heart disease Father    Asthma Sister    Asthma Sister    Colon cancer Sister    Rheum arthritis Sister        x3    PHYSICAL EXAM: Vitals:   02/28/22 1427  BP: (!) 140/82  Pulse: (!) 105  SpO2: 95%   GENERAL: Well nourished, well developed, and in no apparent distress at rest.  HEENT: Negative for arcus senilis or xanthelasma. There is no scleral icterus.  The mucous membranes are pink and moist.   NECK: Supple, No masses. Normal carotid upstrokes without bruits. No masses or thyromegaly.    CHEST: There are no chest wall deformities. There is no chest wall tenderness. Respirations are unlabored.  Lungs- *** CARDIAC:  JVP: *** cm H2O         {HEART SOUNDS:22645}  Normal rate with regular rhythm. No murmurs, rubs or gallops.  Pulses are 2+ and symmetrical in upper and lower extremities. *** edema.  ABDOMEN: Soft, non-tender, non-distended. There are no masses or hepatomegaly. There are normal bowel sounds.  EXTREMITIES: Warm and well perfused with no cyanosis, clubbing.  LYMPHATIC: No axillary or supraclavicular lymphadenopathy.  NEUROLOGIC: Patient is oriented x3 with no focal or lateralizing neurologic deficits.  PSYCH: Patients affect is  appropriate, there is no evidence of anxiety or depression.  SKIN: Warm and dry; no lesions or wounds.   DATA REVIEW  ECG:***  ECHO:***  CATH:***   ASSESSMENT & PLAN:  *** Etiology of HF:*** NYHA class / AHA Stage:*** Volume status & Diuretics: *** Vasodilators:*** Beta-Blocker:*** MRA:*** Cardiometabolic:*** Devices therapies & Valvulopathies:*** Advanced therapies:***  Follow-up:  No follow-ups on file.   Alice Burnside Advanced Heart Failure Mechanical Circulatory Support

## 2022-02-28 NOTE — Patient Instructions (Signed)
Stop Losartan.  Start Entresto 24/26 Twice daily  Start Spironolactone 12.'5mg'$  (1/2 Tab) daily  Increase Carvedilol to 6.25 mg Twice daily  Labs done today, your results will be available in MyChart, we will contact you for abnormal readings.  Please follow up with our heart failure pharmacist in 2-3 weeks  Your physician recommends that you schedule a follow-up appointment in: 6 weeks  If you have any questions or concerns before your next appointment please send [place] a message through [place] or call our office at [phone].    TO LEAVE A MESSAGE FOR THE NURSE SELECT OPTION 2, PLEASE LEAVE A MESSAGE INCLUDING: YOUR NAME DATE OF BIRTH CALL BACK NUMBER REASON FOR CALL**this is important as we prioritize the call backs  YOU WILL RECEIVE A CALL BACK THE SAME DAY AS LONG AS YOU CALL BEFORE 4:00 PM  At the [facility], you and your health needs are our priority. As part of our continuing mission to provide you with exceptional heart care, we have created designated Provider Care Teams. These Care Teams include your primary Cardiologist (physician) and Advanced Practice Providers (APPs- Physician Assistants and Nurse Practitioners) who all work together to provide you with the care you need, when you need it.   You may see any of the following providers on your designated Care Team at your next follow up: Dr Glori Bickers Dr Loralie Champagne Dr. Roxana Hires, NP Lyda Jester, [place], [place] Forestine Na, NP Audry Riles, PharmD   Please be sure to bring in all your medications bottles to every appointment.

## 2022-02-28 NOTE — Telephone Encounter (Signed)
Advanced Heart Failure Patient Advocate Encounter   Patient is in the donut hole for 2023, Entresto returns $178.60 for 30 days. Recommended free trial and samples to cover medication until insurance renews on Jan 1.  Follow up request for Jardiance, test billing indicates $158.66 for 30 days. Patient may be eligible for a grant that would help with the cost of both medications, but he has already left the office.   Patient will contact office if he has trouble paying for medication between now and Jan 1.  Clista Bernhardt, CPhT Rx Patient Advocate Phone: [phone]

## 2022-03-03 ENCOUNTER — Encounter (HOSPITAL_COMMUNITY): Payer: Self-pay | Admitting: Cardiology

## 2022-03-20 DIAGNOSIS — E1122 Type 2 diabetes mellitus with diabetic chronic kidney disease: Secondary | ICD-10-CM | POA: Diagnosis not present

## 2022-03-20 DIAGNOSIS — I10 Essential (primary) hypertension: Secondary | ICD-10-CM | POA: Diagnosis not present

## 2022-03-20 DIAGNOSIS — Z87891 Personal history of nicotine dependence: Secondary | ICD-10-CM | POA: Diagnosis not present

## 2022-03-20 DIAGNOSIS — N181 Chronic kidney disease, stage 1: Secondary | ICD-10-CM | POA: Diagnosis not present

## 2022-03-20 DIAGNOSIS — E1165 Type 2 diabetes mellitus with hyperglycemia: Secondary | ICD-10-CM | POA: Diagnosis not present

## 2022-03-20 DIAGNOSIS — Z794 Long term (current) use of insulin: Secondary | ICD-10-CM | POA: Diagnosis not present

## 2022-03-20 DIAGNOSIS — E1142 Type 2 diabetes mellitus with diabetic polyneuropathy: Secondary | ICD-10-CM | POA: Diagnosis not present

## 2022-03-24 ENCOUNTER — Inpatient Hospital Stay (HOSPITAL_COMMUNITY): Admission: RE | Admit: 2022-03-24 | Payer: Medicare Other | Source: Ambulatory Visit

## 2022-04-02 ENCOUNTER — Ambulatory Visit: Payer: Medicare Other | Admitting: Podiatry

## 2022-04-02 ENCOUNTER — Encounter: Payer: Self-pay | Admitting: Podiatry

## 2022-04-02 DIAGNOSIS — M79674 Pain in right toe(s): Secondary | ICD-10-CM

## 2022-04-02 DIAGNOSIS — B351 Tinea unguium: Secondary | ICD-10-CM

## 2022-04-02 DIAGNOSIS — G629 Polyneuropathy, unspecified: Secondary | ICD-10-CM | POA: Diagnosis not present

## 2022-04-02 DIAGNOSIS — M79675 Pain in left toe(s): Secondary | ICD-10-CM

## 2022-04-05 NOTE — Progress Notes (Signed)
Subjective:   Patient ID: Phillip Rich, male   DOB: 71 y.o.   MRN: [id number]   HPI Patient presents with elongated nailbeds 1-5 both feet that he cannot cut and some tingling burning pain that he developed in both pains with history of diabetes.  Patient does not have trouble sleeping and its only mild but was just wondering.  Patient has just stopped smoking and tries to keep his sugars under good control tries to be active   Review of Systems  All other systems reviewed and are negative.       Objective:  Physical Exam Vitals and nursing note reviewed.  Constitutional:      Appearance: He is well-developed.  Pulmonary:     Effort: Pulmonary effort is normal.  Musculoskeletal:        General: Normal range of motion.  Skin:    General: Skin is warm.  Neurological:     Mental Status: He is alert.     Neurovascular status was found to be mildly reduced but intact with no history of claudication with mild reduction sharp dull vibratory.  Patient has thick yellow brittle nailbeds 1-5 both feet that are discomforting and impossible for the patient to cut or reach at this time.  Good digital perfusion well-oriented x 3     Assessment:  Long-term diabetes under reasonably good control mycotic nail infection 1-5 both feet     Plan:  H&P reviewed condition explained diabetic foot inspections to be done daily and debrided nailbeds 1-5 both feet no angiogenic bleeding reappoint for routine care

## 2022-04-18 ENCOUNTER — Encounter (HOSPITAL_COMMUNITY): Payer: Self-pay | Admitting: Cardiology

## 2022-04-18 ENCOUNTER — Ambulatory Visit (HOSPITAL_COMMUNITY)
Admission: RE | Admit: 2022-04-18 | Discharge: 2022-04-18 | Disposition: A | Discharge status: Home / Self Care | Payer: Medicare Other | Source: Ambulatory Visit | Attending: Cardiology | Admitting: Cardiology

## 2022-04-18 ENCOUNTER — Telehealth (HOSPITAL_COMMUNITY): Payer: Self-pay | Admitting: *Deleted

## 2022-04-18 VITALS — BP 140/80 | HR 87 | Wt 187.8 lb

## 2022-04-18 DIAGNOSIS — I110 Hypertensive heart disease with heart failure: Secondary | ICD-10-CM | POA: Diagnosis not present

## 2022-04-18 DIAGNOSIS — Z5986 Financial insecurity: Secondary | ICD-10-CM | POA: Insufficient documentation

## 2022-04-18 DIAGNOSIS — E1165 Type 2 diabetes mellitus with hyperglycemia: Secondary | ICD-10-CM | POA: Diagnosis not present

## 2022-04-18 DIAGNOSIS — I2694 Multiple subsegmental pulmonary emboli without acute cor pulmonale: Secondary | ICD-10-CM | POA: Diagnosis not present

## 2022-04-18 DIAGNOSIS — Z7901 Long term (current) use of anticoagulants: Secondary | ICD-10-CM | POA: Insufficient documentation

## 2022-04-18 DIAGNOSIS — I5022 Chronic systolic (congestive) heart failure: Secondary | ICD-10-CM | POA: Diagnosis not present

## 2022-04-18 DIAGNOSIS — Z79899 Other long term (current) drug therapy: Secondary | ICD-10-CM | POA: Diagnosis not present

## 2022-04-18 DIAGNOSIS — I2510 Atherosclerotic heart disease of native coronary artery without angina pectoris: Secondary | ICD-10-CM | POA: Diagnosis not present

## 2022-04-18 DIAGNOSIS — J449 Chronic obstructive pulmonary disease, unspecified: Secondary | ICD-10-CM | POA: Insufficient documentation

## 2022-04-18 DIAGNOSIS — F17210 Nicotine dependence, cigarettes, uncomplicated: Secondary | ICD-10-CM | POA: Diagnosis not present

## 2022-04-18 DIAGNOSIS — E119 Type 2 diabetes mellitus without complications: Secondary | ICD-10-CM | POA: Insufficient documentation

## 2022-04-18 DIAGNOSIS — Z794 Long term (current) use of insulin: Secondary | ICD-10-CM

## 2022-04-18 DIAGNOSIS — Z905 Acquired absence of kidney: Secondary | ICD-10-CM | POA: Insufficient documentation

## 2022-04-18 DIAGNOSIS — I2699 Other pulmonary embolism without acute cor pulmonale: Secondary | ICD-10-CM | POA: Insufficient documentation

## 2022-04-18 MED ORDER — ENTRESTO 49-51 MG PO TABS: 1.0000 | ORAL_TABLET | Freq: Two times a day (BID) | ORAL | 11 refills | Status: DC

## 2022-04-18 NOTE — Progress Notes (Signed)
Medication Samples have been provided to the patient.  Drug name: Eliquis       Strength: 5 mg        Qty: 4  LOT: [id number]  Exp.Date: 10/2023  Dosing instructions: Take 1 tablet Twice daily   The patient has been instructed regarding the correct time, dose, and frequency of taking this medication, including desired effects and most common side effects.   Phillip Rich Phillip Rich 3:51 PM 04/18/2022

## 2022-04-18 NOTE — Telephone Encounter (Signed)
Advanced Heart Failure Patient Advocate Encounter  Patient inquired about potential assistance for Phillip Rich while in office. This patient does meet the criteria for a [employer] grant. I will submit this request and contact the patient with processing information.

## 2022-04-18 NOTE — Progress Notes (Signed)
Medication Samples have been provided to the patient.  Drug name: Jardiance       Strength: 10 mg        Qty: 4  LOT: [id number]  Exp.Date: 03/2024  Dosing instructions: Take 1 tablet daily  The patient has been instructed regarding the correct time, dose, and frequency of taking this medication, including desired effects and most common side effects.   Phillip Rich Shon Mansouri 3:59 PM 04/18/2022

## 2022-04-18 NOTE — Progress Notes (Signed)
ADVANCED HEART FAILURE CLINIC NOTE  Referring Physician: Lajean Manes, MD  Primary Care: Lajean Manes, MD Primary Cardiologist: N/A  HPI: Phillip Rich is a 72 y.o. male with T2DM, HTN, renal mass s/p nephrectomy, COPD that presented to the [facility] ER w/ acute onset SOB on 08/27 when he was diagnosed with acute PE and HFrEF w/ an LVEF of 25%-30%; during admission, coronary angiography was significant for CTO of the RCA and diffusely diseased Lcx. RHC w/ elevated pre and post capillary filling pressures. He was diuresed and discharged home and started on some GDMT. Since that time, Mr. Fabela has been seen in [facility] clinic where medications were further adjusted.   Interval hx:  Symptomatically doing fairly well; minimal SOB, PND, chest pain. Continuing to smoke 1/2pk daily however he is planning on cutting down now. Motivated to start exercise and cardiac rehab now. Has been out of apixaban/jardiance lately due to cost.   Activity level/exercise tolerance:  NYHA IIB Orthopnea:  Sleeps on 2 pillows Paroxysmal noctural dyspnea:  No Chest pain/pressure:  No Orthostatic lightheadedness:  No Palpitations:  No Lower extremity edema:  minimal Presyncope/syncope:  No Cough:  Intermittent  Past Medical History:  Diagnosis Date   Anxiety    BPH (benign prostatic hyperplasia)    Complication of anesthesia    Diabetes mellitus    DJD (degenerative joint disease)    GERD (gastroesophageal reflux disease)    HTN (hypertension)    Hyperlipidemia    ILD (interstitial lung disease) (HCC)    Left renal mass    Melanoma ([place])    Mild sleep apnea    Sleep study pending   PONV (postoperative nausea and vomiting)     Current Outpatient Medications  Medication Sig Dispense Refill   albuterol (VENTOLIN HFA) 108 (90 Base) MCG/ACT inhaler Inhale 1-2 puffs into the lungs every 6 (six) hours as needed for wheezing or shortness of breath. 1 each 0   apixaban (ELIQUIS) 5 MG TABS tablet Take  1 tablet (5 mg total) by mouth 2 (two) times daily. 60 tablet 0   atorvastatin (LIPITOR) 80 MG tablet Take 1 tablet (80 mg total) by mouth daily. 30 tablet 0   carvedilol (COREG) 6.25 MG tablet Take 1 tablet (6.25 mg total) by mouth 2 (two) times daily with a meal. 180 tablet 3   Cholecalciferol (VITAMIN D PO) Take 1 tablet by mouth every morning.     DULoxetine (CYMBALTA) 60 MG capsule Take 60 mg by mouth daily.     empagliflozin (JARDIANCE) 10 MG TABS tablet Take 1 tablet (10 mg total) by mouth daily before breakfast. 30 tablet 11   fluticasone (FLONASE) 50 MCG/ACT nasal spray Place 1 spray into both nostrils daily as needed for allergies or rhinitis.     furosemide (LASIX) 20 MG tablet Take 1 tablet (20 mg total) by mouth daily. 30 tablet 0   insulin lispro (HUMALOG KWIKPEN) 100 UNIT/ML KwikPen Inject 12 Units into the skin 2 (two) times daily as needed (high blood sugar).     ipratropium (ATROVENT) 0.03 % nasal spray Place 2 sprays into the nose 2 (two) times daily. 30 mL 0   levocetirizine (XYZAL) 5 MG tablet Take 5 mg by mouth daily.     metFORMIN (GLUCOPHAGE) 1000 MG tablet Take 1,000 mg by mouth 2 (two) times daily.      omeprazole (PRILOSEC) 20 MG capsule Take 1 tablet by mouth every morning.      oxymetazoline (AFRIN) 0.05 %  nasal spray Place 1 spray into both nostrils 2 (two) times daily as needed for congestion.     sacubitril-valsartan (ENTRESTO) 49-51 MG Take 1 tablet by mouth 2 (two) times daily. 60 tablet 11   Semaglutide,0.25 or 0.'5MG'$ /DOS, (OZEMPIC, 0.25 OR 0.5 MG/DOSE,) 2 MG/3ML SOPN Inject 0.5 mg into the skin once a week.     spironolactone (ALDACTONE) 25 MG tablet Take 0.5 tablets (12.5 mg total) by mouth daily. 45 tablet 3   vitamin B-12 (CYANOCOBALAMIN) 100 MCG tablet Take 100 mcg by mouth daily.     VITAMIN D PO Take by mouth every morning.     zolpidem (AMBIEN) 10 MG tablet Take 10 mg by mouth at bedtime.     No current facility-administered medications for this  encounter.    Allergies  Allergen Reactions   Codeine Nausea And Vomiting   Crestor [Rosuvastatin Calcium] Other (See Comments)    Leg aches   Erythromycin Rash    All Mycin drugs      Social History   Socioeconomic History   Marital status: Single    Spouse name: Not on file   Number of children: 0   Years of education: Not on file   Highest education level: High school graduate  Occupational History   Occupation: retired  Tobacco Use   Smoking status: Former    Packs/day: 1.00    Years: 50.00    Total pack years: 50.00    Types: Cigarettes    Quit date: 10/16/2021    Years since quitting: 0.5   Smokeless tobacco: Never  Vaping Use   Vaping Use: Never used  Substance and Sexual Activity   Alcohol use: Yes    Comment: rare occ.   Drug use: Yes    Types: Marijuana    Comment: 07/29/21 3 x week to help sleep   Sexual activity: Not on file  Other Topics Concern   Not on file  Social History Narrative   Lives alone.  Divorced.  Retired Sport and exercise psychologist.       Social Determinants of Health   Financial Resource Strain: Medium Risk (01/16/2022)   Overall Financial Resource Strain (CARDIA)    Difficulty of Paying Living Expenses: Somewhat hard  Food Insecurity: No Food Insecurity (01/16/2022)   Hunger Vital Sign    Worried About Running Out of Food in the Last Year: Never true    Ran Out of Food in the Last Year: Never true  Transportation Needs: No Transportation Needs (01/16/2022)   PRAPARE - Hydrologist (Medical): No    Lack of Transportation (Non-Medical): No  Physical Activity: Not on file  Stress: Not on file  Social Connections: Not on file  Intimate Partner Violence: Not on file      Family History  Problem Relation Age of Onset   Lung cancer Mother    Heart disease Father    Asthma Sister    Asthma Sister    Colon cancer Sister    Rheum arthritis Sister        x3    PHYSICAL EXAM: Vitals:   04/18/22 1516  BP:  (!) 140/80  Pulse: 87  SpO2: 98%    GENERAL: Well nourished, well developed, and in no apparent distress at rest.  HEENT: Negative for arcus senilis or xanthelasma. There is no scleral icterus.  The mucous membranes are pink and moist.   NECK: Supple, No masses. Normal carotid upstrokes without bruits. No masses or thyromegaly.  CHEST: There are no chest wall deformities. There is no chest wall tenderness. Respirations are unlabored.  Lungs- CTA B/L CARDIAC:  JVP: 8cm         Normal S1, S2  Normal rate with regular rhythm. No murmurs, rubs or gallops.  Pulses are 2+ and symmetrical in upper and lower extremities. No edema.  ABDOMEN: Soft, non-tender, non-distended. There are no masses or hepatomegaly. There are normal bowel sounds.  EXTREMITIES: warm to touch; 1+ edema.  LYMPHATIC: No axillary or supraclavicular lymphadenopathy.  NEUROLOGIC: Patient is oriented x3 with no focal or lateralizing neurologic deficits.  PSYCH: Patients affect is appropriate, there is no evidence of anxiety or depression.  SKIN: Warm and dry; no lesions or wounds.   DATA REVIEW  ECG: 02/28/22: NSR  ECHO: TTE 01/10/22: LVEF 15%-20%, RV function moderately reduced. Moderate MR.   CATH: 01/15/22   Ost LM lesion is 20% stenosed.   Prox Cx to Dist Cx lesion is 70% stenosed.   Prox LAD to Mid LAD lesion is 30% stenosed.   Mid RCA lesion is 100% stenosed.   Prox RCA lesion is 70% stenosed.   1.  Significant two-vessel coronary artery disease with chronically occluded mid right coronary artery with left to right as well as bridging collaterals.  The left circumflex is tortuous and diffusely diseased in the midsegment. 2.  Left ventricular angiography was not performed.  EF was severely reduced by echo. 3.  Right heart catheterization showed significantly elevated right and left-sided filling pressures, moderate to severe pulmonary hypertension and moderately reduced cardiac output.  ASSESSMENT &  PLAN:  Heart failure with reduced EF, NYHA IIB-III Etiology of SE:GBTDV, ischemic and nonischemic components; most recent RHC w/ cardiac index of 2.2L/min/m2 with elevated pre and post capillary filling pressures likely secondary to significant volume overload; I do not suspect he has a significant burden of fixed pulmonary hypertension (PVR ~2.9 Wood units).  NYHA class / AHA Stage:IIB-III Volume status & Diuretics: mildy hypervolemic, will increase lasix to '60mg'$  x 3 days then return to '40mg'$  daily. Vasodilators:Increase Entresto to 49/'51mg'$  BID Beta-Blocker: continue coreg 6.'25mg'$  BID VOH:[id number] 12.'5mg'$  daily Cardiometabolic:jardiance '10mg'$  daily; cost prohibitive, will work on patient assistance. Devices therapies & Valvulopathies:CMR scheduled for Monday; if LVEF remains reduced will plan for primary prevention ICD. Advanced therapies:CMR on Monday; will discuss need for further therapies afterwards.  2. Pulmonary embolism  - CT from 01/05/22 w/ acute PE involving lobar and segmental branches at the RLL, RML - apixaban '5mg'$  BID for 6 months.  3. COPD - has not required home O2; stable today; does not report history of frequent exacerbations.  - Last PFTs from 2012, will repeat with CPX.   4. Tobacco abuse - Continuing to smoke 1/2PPD - Planning to cut back now.  [employer] Advanced Heart Failure Mechanical Circulatory Support

## 2022-04-18 NOTE — Telephone Encounter (Signed)
Attempted to call patient regarding upcoming cardiac MRI appointment. Left message on voicemail with name and callback number  Rayanne Padmanabhan RN Navigator Cardiac Imaging [facility] [phone] Office [phone] Cell  

## 2022-04-18 NOTE — Patient Instructions (Signed)
INCREASE Entresto to 49/51 mg Twice daily  INCREASE Lasix to 60 mg for 2 days, then go back to your normal dose.   You have been referred to cardiac rehab. They will call you to arrange your appointment   Your physician recommends that you schedule a follow-up appointment in: 2 months  If you have any questions or concerns before your next appointment please send [place] a message through [facility] or call our office at [phone].    TO LEAVE A MESSAGE FOR THE NURSE SELECT OPTION 2, PLEASE LEAVE A MESSAGE INCLUDING: YOUR NAME DATE OF BIRTH CALL BACK NUMBER REASON FOR CALL**this is important as we prioritize the call backs  YOU WILL RECEIVE A CALL BACK THE SAME DAY AS LONG AS YOU CALL BEFORE 4:00 PM  At the [facility], you and your health needs are our priority. As part of our continuing mission to provide you with exceptional heart care, we have created designated Provider Care Teams. These Care Teams include your primary Cardiologist (physician) and Advanced Practice Providers (APPs- Physician Assistants and Nurse Practitioners) who all work together to provide you with the care you need, when you need it.   You may see any of the following providers on your designated Care Team at your next follow up: Dr Glori Bickers Dr Loralie Champagne Dr. Roxana Hires, NP Lyda Jester, [place] [facility] [place], [place] Forestine Na, NP Audry Riles, PharmD   Please be sure to bring in all your medications bottles to every appointment.

## 2022-04-18 NOTE — Progress Notes (Signed)
Medication Samples have been provided to the patient.  Drug name: Delene Loll       Strength: 49/51 mg        Qty: 2  LOT: [id number]  Exp.Date: 12/2023  Dosing instructions: Take 1 tablet Twice daily   The patient has been instructed regarding the correct time, dose, and frequency of taking this medication, including desired effects and most common side effects.   Phillip Rich Rhylen Shaheen 3:58 PM 04/18/2022

## 2022-04-21 ENCOUNTER — Other Ambulatory Visit (HOSPITAL_COMMUNITY): Payer: Self-pay

## 2022-04-21 ENCOUNTER — Ambulatory Visit (HOSPITAL_COMMUNITY)
Admission: RE | Admit: 2022-04-21 | Discharge: 2022-04-21 | Disposition: A | Discharge status: Home / Self Care | Payer: Medicare Other | Source: Ambulatory Visit | Attending: Physician Assistant | Admitting: Physician Assistant

## 2022-04-21 ENCOUNTER — Other Ambulatory Visit (HOSPITAL_COMMUNITY): Payer: Self-pay | Admitting: Physician Assistant

## 2022-04-21 ENCOUNTER — Other Ambulatory Visit (HOSPITAL_COMMUNITY): Payer: Self-pay | Admitting: *Deleted

## 2022-04-21 DIAGNOSIS — I5022 Chronic systolic (congestive) heart failure: Secondary | ICD-10-CM | POA: Insufficient documentation

## 2022-04-21 MED ORDER — GADOBUTROL 1 MMOL/ML IV SOLN
8.0000 mL | Freq: Once | INTRAVENOUS | Status: AC | PRN
  Administered 2022-04-21: 8 mL via INTRAVENOUS

## 2022-04-21 MED ORDER — ENTRESTO 49-51 MG PO TABS: 1.0000 | ORAL_TABLET | Freq: Two times a day (BID) | ORAL | 11 refills | Status: DC

## 2022-04-21 NOTE — Telephone Encounter (Signed)
Advanced Heart Failure Patient Advocate Encounter  The patient was approved for a [employer] grant that will help cover the cost of Entresto, Jardiance.  Total amount awarded, $10,000.  Effective: 03/22/22 - 03/22/23.  BIN [id number] PCN [id number] Group [id number] ID [id number]  New prescription(s) sent to [facility] [address]. Patient provided with approval and processing information via USPS.  Clista Bernhardt, CPhT Rx Patient Advocate Phone: [phone]

## 2022-04-30 ENCOUNTER — Other Ambulatory Visit (HOSPITAL_COMMUNITY): Payer: Self-pay

## 2022-04-30 ENCOUNTER — Telehealth (HOSPITAL_COMMUNITY): Payer: Self-pay | Admitting: Cardiology

## 2022-04-30 DIAGNOSIS — I5022 Chronic systolic (congestive) heart failure: Secondary | ICD-10-CM

## 2022-04-30 MED ORDER — CARVEDILOL 6.25 MG PO TABS: 6.2500 mg | ORAL_TABLET | Freq: Two times a day (BID) | ORAL | 3 refills | Status: DC

## 2022-04-30 NOTE — Telephone Encounter (Signed)
-----   Message from Joette Catching, [place] sent at 04/28/2022  3:55 PM EST ----- I looked at his cardiac MRI with Dr. Daniel Nones. Heart muscle remains weak this is likely d/t blockages in heart arteries. EF 20%. Not improving with HF meds. Recommend referral to EP to see about ICD.  I think this was previously discussed with him.

## 2022-04-30 NOTE — Telephone Encounter (Signed)
Patient called.  Patient aware. Referral placed 

## 2022-05-21 ENCOUNTER — Encounter (HOSPITAL_COMMUNITY): Payer: Self-pay | Admitting: Cardiology

## 2022-05-21 ENCOUNTER — Ambulatory Visit (HOSPITAL_COMMUNITY)
Admission: RE | Admit: 2022-05-21 | Discharge: 2022-05-21 | Disposition: A | Discharge status: Home / Self Care | Payer: Medicare Other | Source: Ambulatory Visit | Attending: Cardiology | Admitting: Cardiology

## 2022-05-21 ENCOUNTER — Telehealth (HOSPITAL_COMMUNITY): Payer: Self-pay | Admitting: Vascular Surgery

## 2022-05-21 VITALS — BP 106/60 | HR 99 | Wt 186.6 lb

## 2022-05-21 DIAGNOSIS — I5022 Chronic systolic (congestive) heart failure: Secondary | ICD-10-CM | POA: Diagnosis not present

## 2022-05-21 DIAGNOSIS — I2694 Multiple subsegmental pulmonary emboli without acute cor pulmonale: Secondary | ICD-10-CM | POA: Diagnosis not present

## 2022-05-21 LAB — URINALYSIS, ROUTINE W REFLEX MICROSCOPIC
Bilirubin Urine: NEGATIVE
Glucose, UA: >=500 mg/dL — AB
Ketones, ur: NEGATIVE mg/dL
Leukocytes,Ua: NEGATIVE
Nitrite: NEGATIVE
Protein, ur: 100 mg/dL — AB
Specific Gravity, Urine: 1.021 (ref 1.005–1.030)
pH: 6 (ref 5.0–8.0)

## 2022-05-21 LAB — BASIC METABOLIC PANEL
Anion gap: 8 (ref 5–15)
BUN: 21 mg/dL (ref 8–23)
CO2: 28 mmol/L (ref 22–32)
Calcium: 10.1 mg/dL (ref 8.9–10.3)
Chloride: 100 mmol/L (ref 98–111)
Creatinine, Ser: 1.04 mg/dL (ref 0.61–1.24)
GFR, Estimated: >60 mL/min (ref >60)
Glucose, Bld: 177 mg/dL — ABNORMAL HIGH (ref 70–99)
Potassium: 5.3 mmol/L — ABNORMAL HIGH (ref 3.5–5.1)
Sodium: 136 mmol/L (ref 135–145)

## 2022-05-21 LAB — BRAIN NATRIURETIC PEPTIDE: B Natriuretic Peptide: 1018.3 pg/mL — ABNORMAL HIGH (ref 0.0–100.0)

## 2022-05-21 MED ORDER — FUROSEMIDE 20 MG PO TABS: 20.0000 mg | ORAL_TABLET | ORAL | 0 refills | Status: DC | PRN

## 2022-05-21 MED ORDER — EMPAGLIFLOZIN 10 MG PO TABS: 10.0000 mg | ORAL_TABLET | Freq: Every day | ORAL | 0 refills | Status: DC

## 2022-05-21 NOTE — Progress Notes (Signed)
Medication Samples have been provided to the patient.  Drug name: Eliquis       Strength: 5 mg        Qty: 3  LOT: [id number]  Exp.Date:  01/2024   Dosing instructions: Take 1 tablet Twice daily   The patient has been instructed regarding the correct time, dose, and frequency of taking this medication, including desired effects and most common side effects.   Phillip Rich 4:14 PM 05/21/2022

## 2022-05-21 NOTE — Progress Notes (Signed)
ADVANCED HEART FAILURE CLINIC NOTE  Referring Physician: Lajean Manes, MD  Primary Care: Lajean Manes, MD Primary Cardiologist: N/A  HPI: Phillip Rich is a 72 y.o. male with T2DM, HTN, renal mass s/p nephrectomy, COPD that presented to the [facility] ER w/ acute onset SOB on 08/27 when he was diagnosed with acute PE and HFrEF w/ an LVEF of 25%-30%; during admission, coronary angiography was significant for CTO of the RCA and diffusely diseased Lcx. RHC w/ elevated pre and post capillary filling pressures. He was diuresed and discharged home and started on some GDMT. Since that time, Mr. Nester has been seen in [facility] clinic where medications were further adjusted.   Interval hx:  He has now stopped smoking cigarettes completely. He continues to travel to comedy shows. He has felt a slight decline in functional status / exercise capacity over the past several weeks now. Otherwise, only complaint is possible burning with urination.   Activity level/exercise tolerance:  NYHA IIB Orthopnea:  Sleeps on 2 pillows Paroxysmal noctural dyspnea:  No Chest pain/pressure:  No Orthostatic lightheadedness:  No Palpitations:  No Lower extremity edema:  minimal Presyncope/syncope:  No Cough:  Intermittent  Past Medical History:  Diagnosis Date   Anxiety    BPH (benign prostatic hyperplasia)    Complication of anesthesia    Diabetes mellitus    DJD (degenerative joint disease)    GERD (gastroesophageal reflux disease)    HTN (hypertension)    Hyperlipidemia    ILD (interstitial lung disease) (HCC)    Left renal mass    Melanoma ([place])    Mild sleep apnea    Sleep study pending   PONV (postoperative nausea and vomiting)     Current Outpatient Medications  Medication Sig Dispense Refill   albuterol (VENTOLIN HFA) 108 (90 Base) MCG/ACT inhaler Inhale 1-2 puffs into the lungs every 6 (six) hours as needed for wheezing or shortness of breath. 1 each 0   apixaban (ELIQUIS) 5 MG TABS  tablet Take 1 tablet (5 mg total) by mouth 2 (two) times daily. 60 tablet 0   atorvastatin (LIPITOR) 80 MG tablet Take 1 tablet (80 mg total) by mouth daily. 30 tablet 0   carvedilol (COREG) 3.125 MG tablet Take 3.125 mg by mouth 2 (two) times daily with a meal.     Cholecalciferol (VITAMIN D PO) Take 1 tablet by mouth every morning.     DULoxetine (CYMBALTA) 60 MG capsule Take 60 mg by mouth daily.     fluticasone (FLONASE) 50 MCG/ACT nasal spray Place 1 spray into both nostrils daily as needed for allergies or rhinitis.     insulin lispro (HUMALOG KWIKPEN) 100 UNIT/ML KwikPen Inject 12 Units into the skin 2 (two) times daily as needed (high blood sugar).     ipratropium (ATROVENT) 0.03 % nasal spray Place 2 sprays into the nose 2 (two) times daily. 30 mL 0   levocetirizine (XYZAL) 5 MG tablet Take 5 mg by mouth daily.     metFORMIN (GLUCOPHAGE) 1000 MG tablet Take 1,000 mg by mouth 2 (two) times daily.      omeprazole (PRILOSEC) 20 MG capsule Take 1 tablet by mouth every morning.      oxymetazoline (AFRIN) 0.05 % nasal spray Place 1 spray into both nostrils 2 (two) times daily as needed for congestion.     sacubitril-valsartan (ENTRESTO) 49-51 MG Take 1 tablet by mouth 2 (two) times daily. 60 tablet 11   spironolactone (ALDACTONE) 25 MG tablet Take  0.5 tablets (12.5 mg total) by mouth daily. 45 tablet 3   vitamin B-12 (CYANOCOBALAMIN) 100 MCG tablet Take 100 mcg by mouth daily.     zolpidem (AMBIEN) 10 MG tablet Take 10 mg by mouth.     empagliflozin (JARDIANCE) 10 MG TABS tablet Take 1 tablet (10 mg total) by mouth daily before breakfast. 30 tablet 0   furosemide (LASIX) 20 MG tablet Take 1 tablet (20 mg total) by mouth as needed. Great than 3lb in 24 hours or 5 lb in a week 30 tablet 0   Semaglutide,0.25 or 0.'5MG'$ /DOS, (OZEMPIC, 0.25 OR 0.5 MG/DOSE,) 2 MG/3ML SOPN Inject 0.5 mg into the skin once a week. (Patient not taking: Reported on 05/21/2022)     No current facility-administered  medications for this encounter.    Allergies  Allergen Reactions   Codeine Nausea And Vomiting   Crestor [Rosuvastatin Calcium] Other (See Comments)    Leg aches   Erythromycin Rash    All Mycin drugs      Social History   Socioeconomic History   Marital status: Single    Spouse name: Not on file   Number of children: 0   Years of education: Not on file   Highest education level: High school graduate  Occupational History   Occupation: retired  Tobacco Use   Smoking status: Former    Packs/day: 1.00    Years: 50.00    Total pack years: 50.00    Types: Cigarettes    Quit date: 10/16/2021    Years since quitting: 0.6   Smokeless tobacco: Never  Vaping Use   Vaping Use: Never used  Substance and Sexual Activity   Alcohol use: Yes    Comment: rare occ.   Drug use: Yes    Types: Marijuana    Comment: 07/29/21 3 x week to help sleep   Sexual activity: Not on file  Other Topics Concern   Not on file  Social History Narrative   Lives alone.  Divorced.  Retired Sport and exercise psychologist.       Social Determinants of Health   Financial Resource Strain: Medium Risk (01/16/2022)   Overall Financial Resource Strain (CARDIA)    Difficulty of Paying Living Expenses: Somewhat hard  Food Insecurity: No Food Insecurity (01/16/2022)   Hunger Vital Sign    Worried About Running Out of Food in the Last Year: Never true    Ran Out of Food in the Last Year: Never true  Transportation Needs: No Transportation Needs (01/16/2022)   PRAPARE - Hydrologist (Medical): No    Lack of Transportation (Non-Medical): No  Physical Activity: Not on file  Stress: Not on file  Social Connections: Not on file  Intimate Partner Violence: Not on file      Family History  Problem Relation Age of Onset   Lung cancer Mother    Heart disease Father    Asthma Sister    Asthma Sister    Colon cancer Sister    Rheum arthritis Sister        x3    PHYSICAL EXAM: Vitals:    05/21/22 1510  BP: 106/60  Pulse: 99  SpO2: 97%     GENERAL: NAD HEENT: Negative for arcus senilis or xanthelasma. There is no scleral icterus.  The mucous membranes are pink and moist.   NECK: Supple, No masses. Normal carotid upstrokes without bruits. No masses or thyromegaly.    CHEST: There are no chest wall deformities.  There is no chest wall tenderness. Respirations are unlabored.  Lungs- CTA B/L CARDIAC:  JVP: 7-8cm         Normal S1, S2  Normal rate with regular rhythm. No murmurs, rubs or gallops.  Pulses are 2+ and symmetrical in upper and lower extremities. No edema.  ABDOMEN: Soft, non-tender, non-distended. There are no masses or hepatomegaly. There are normal bowel sounds.  EXTREMITIES: warm to touch; 1+ edema.  LYMPHATIC: No axillary or supraclavicular lymphadenopathy.  NEUROLOGIC: Patient is oriented x3 with no focal or lateralizing neurologic deficits.  PSYCH: Patients affect is appropriate, there is no evidence of anxiety or depression.  SKIN: Warm and dry; no lesions or wounds.   DATA REVIEW  ECG: 02/28/22: NSR  ECHO: TTE 01/10/22: LVEF 15%-20%, RV function moderately reduced. Moderate MR.   CATH: 01/15/22   Ost LM lesion is 20% stenosed.   Prox Cx to Dist Cx lesion is 70% stenosed.   Prox LAD to Mid LAD lesion is 30% stenosed.   Mid RCA lesion is 100% stenosed.   Prox RCA lesion is 70% stenosed.   1.  Significant two-vessel coronary artery disease with chronically occluded mid right coronary artery with left to right as well as bridging collaterals.  The left circumflex is tortuous and diffusely diseased in the midsegment. 2.  Left ventricular angiography was not performed.  EF was severely reduced by echo. 3.  Right heart catheterization showed significantly elevated right and left-sided filling pressures, moderate to severe pulmonary hypertension and moderately reduced cardiac output.  ASSESSMENT & PLAN:  Heart failure with reduced EF, NYHA  IIB-III Etiology of ZO:XWRUE, ischemic and nonischemic components; most recent RHC w/ cardiac index of 2.2L/min/m2 with elevated pre and post capillary filling pressures likely secondary to significant volume overload; I do not suspect he has a significant burden of fixed pulmonary hypertension (PVR ~2.9 Wood units).  NYHA class / AHA Stage:IIB-III Volume status & Diuretics:  Euvolemic, continue lasix '40mg'$  daily Vasodilators: continue  Entresto to 49/'51mg'$  BID Beta-Blocker: continue coreg 6.'25mg'$  BID AVW:[id number] 12.'5mg'$  daily Cardiometabolic:jardiance '10mg'$  daily; urinalysis negative for UTI Devices therapies & Valvulopathies: CMR w/ LVEF 20%. Refer to EP for primary prevention ICD.  Advanced therapies: CMR w/ LVEF of 20% despite GDMT. Mr. Shepperson is very active, he enjoys traveling and maintaining his current level of activity is very important to him. I think he would be an excellent candidate for LVAD therapy if he preferred. We discussed this at length today. Will plan for CPX.   2. Pulmonary embolism  - CT from 01/05/22 w/ acute PE involving lobar and segmental branches at the RLL, RML - apixaban '5mg'$  BID for 6 months.  3. COPD - has not required home O2; stable today; does not report history of frequent exacerbations.  - Last PFTs from 2012, will repeat with CPX.   4. Tobacco abuse - Continuing to smoke 1/2PPD - Planning to cut back now.  Jahnavi Muratore Advanced Heart Failure Mechanical Circulatory Support

## 2022-05-21 NOTE — Telephone Encounter (Signed)
Lvm / giving pt new CPX appt/ appt moved from 1/22 to 1/24 asked pt to call back to confirm

## 2022-05-21 NOTE — Patient Instructions (Signed)
HOLD Jardiance for now  CHANGE Lasix to as needed for weight gain of 3lb in 24 hour or 5lb in a week. Swelling or shortness of breath   Labs done today, your results will be available in MyChart, we will contact you for abnormal readings.  Your physician has recommended that you have a cardiopulmonary stress test (CPX). CPX testing is a non-invasive measurement of heart and lung function. It replaces a traditional treadmill stress test. This type of test provides a tremendous amount of information that relates not only to your present condition but also for future outcomes. This test combines measurements of you ventilation, respiratory gas exchange in the lungs, electrocardiogram (EKG), blood pressure and physical response before, during, and following an exercise protocol.  Your provider has referred you to the Electrophysiologist. The office will call you to arrange your appointment   Your physician recommends that you schedule a follow-up appointment in: 1 month  If you have any questions or concerns before your next appointment please send [place] a message through [place] or call our office at [phone].    TO LEAVE A MESSAGE FOR THE NURSE SELECT OPTION 2, PLEASE LEAVE A MESSAGE INCLUDING: YOUR NAME DATE OF BIRTH CALL BACK NUMBER REASON FOR CALL**this is important as we prioritize the call backs  YOU WILL RECEIVE A CALL BACK THE SAME DAY AS LONG AS YOU CALL BEFORE 4:00 PM  At the [facility], you and your health needs are our priority. As part of our continuing mission to provide you with exceptional heart care, we have created designated Provider Care Teams. These Care Teams include your primary Cardiologist (physician) and Advanced Practice Providers (APPs- Physician Assistants and Nurse Practitioners) who all work together to provide you with the care you need, when you need it.   You may see any of the following providers on your designated Care Team at your next  follow up: Dr Glori Bickers Dr Loralie Champagne Dr. Roxana Hires, NP Lyda Jester, [place] [facility], [place] Forestine Na, NP Audry Riles, PharmD   Please be sure to bring in all your medications bottles to every appointment.

## 2022-05-23 ENCOUNTER — Telehealth (HOSPITAL_COMMUNITY): Payer: Self-pay

## 2022-05-23 NOTE — Telephone Encounter (Addendum)
Returned patients call and informed him of his results. Pt aware, agreeable, and verbalized understanding. He will restart his jardiance in the morning    ----- Message from Hebert Soho, DO sent at 05/23/2022  2:04 PM EST ----- Phillip Rich urinalysis does not show any signs of UTI. He can restart his jardiance.   Thank you ,   Adi

## 2022-05-27 DIAGNOSIS — E7800 Pure hypercholesterolemia, unspecified: Secondary | ICD-10-CM | POA: Diagnosis not present

## 2022-05-27 DIAGNOSIS — E1142 Type 2 diabetes mellitus with diabetic polyneuropathy: Secondary | ICD-10-CM | POA: Diagnosis not present

## 2022-05-27 DIAGNOSIS — I10 Essential (primary) hypertension: Secondary | ICD-10-CM | POA: Diagnosis not present

## 2022-05-27 DIAGNOSIS — K219 Gastro-esophageal reflux disease without esophagitis: Secondary | ICD-10-CM | POA: Diagnosis not present

## 2022-05-31 ENCOUNTER — Telehealth (HOSPITAL_COMMUNITY): Payer: Self-pay | Admitting: Cardiology

## 2022-05-31 NOTE — Telephone Encounter (Signed)
Called patient about potassium; will hold spironolactone until repeat labs. Otherwise, doing well, no complaints.   Adi Finleigh Cheong

## 2022-06-02 ENCOUNTER — Encounter (HOSPITAL_COMMUNITY): Payer: Medicare Other

## 2022-06-04 ENCOUNTER — Encounter (HOSPITAL_COMMUNITY): Payer: Medicare Other

## 2022-06-09 ENCOUNTER — Encounter (HOSPITAL_COMMUNITY): Payer: Medicare Other

## 2022-06-19 ENCOUNTER — Encounter (HOSPITAL_COMMUNITY): Payer: Medicare Other | Admitting: Cardiology

## 2022-06-20 ENCOUNTER — Ambulatory Visit (HOSPITAL_COMMUNITY): Payer: Medicare Other | Attending: Cardiology

## 2022-06-20 DIAGNOSIS — I5022 Chronic systolic (congestive) heart failure: Secondary | ICD-10-CM

## 2022-06-24 ENCOUNTER — Ambulatory Visit: Payer: Medicare Other | Attending: Internal Medicine | Admitting: Internal Medicine

## 2022-06-24 ENCOUNTER — Encounter: Payer: Self-pay | Admitting: Internal Medicine

## 2022-06-24 VITALS — BP 112/64 | HR 82 | Ht 69.0 in | Wt 190.0 lb

## 2022-06-24 DIAGNOSIS — I5022 Chronic systolic (congestive) heart failure: Secondary | ICD-10-CM | POA: Diagnosis not present

## 2022-06-24 NOTE — Patient Instructions (Signed)
Medication Instructions:  Your physician recommends that you continue on your current medications as directed. Please refer to the Current Medication list given to you today.  *If you need a refill on your cardiac medications before your next appointment, please call your pharmacy*   Lab Work: None ordered.  If you have labs (blood work) drawn today and your tests are completely normal, you will receive your results only by: [place] (if you have MyChart) OR A paper copy in the mail If you have any lab test that is abnormal or we need to change your treatment, we will call you to review the results.   Testing/Procedures: None ordered.    Follow-Up: At [facility], you and your health needs are our priority.  As part of our continuing mission to provide you with exceptional heart care, we have created designated Provider Care Teams.  These Care Teams include your primary Cardiologist (physician) and Advanced Practice Providers (APPs -  Physician Assistants and Nurse Practitioners) who all work together to provide you with the care you need, when you need it.  We recommend signing up for the patient portal called "MyChart".  Sign up information is provided on this After Visit Summary.  MyChart is used to connect with patients for Virtual Visits (Telemedicine).  Patients are able to view lab/test results, encounter notes, upcoming appointments, etc.  Non-urgent messages can be sent to your provider as well.   To learn more about what you can do with MyChart, go to [email or web address].    Your next appointment:   3 months with Dr Caryl Comes

## 2022-06-24 NOTE — Progress Notes (Signed)
ELECTROPHYSIOLOGY CONSULT NOTE  Patient ID: Phillip Rich, MRN: [id number], DOB/AGE: 02-04-1951 72 y.o. Admit date: (Not on file) Date of Consult: 06/24/2022  Primary Physician: Lajean Manes, MD Primary Cardiologist: AS     Phillip Rich is a 72 y.o. male who is being seen today for the evaluation of ICD at the request of Dr AS.    HPI Phillip Rich is a 72 y.o. male referred for consideration of an ICD   Has significant dyspnea on exertion having presented with severe heart failure 8/23.  With guideline directed therapy he is much better.  He is able to walk a couple 100 feet.  He is able to unload his car with groceries sleeps on 2 pillows but without nocturnal dyspnea.  No peripheral edema.  He has stopped smoking.  Underwent a CPX as noted below.  DATE TEST EF   9/23 Echo   15-20 % RV dysfn modest TR mod  9//23 LHC   % RCAm-CTO; LCX-diffuse  12/23 cMRI 20% Anterior wall MI LGE >50% Inferior wall MI LGE <50%   Date Cr K Hgb  3/23   13.3  9/23   11.3  1/24 1.04 5.3           Cardiopulmonary stress test was significantly abnormal with a minimal increase in blood pressure of 106-122, and heart rate 97--116.  V O2 max was 10.9 with a decreased stroke-volume  increase with effort  PMhx melanoma Kidney tumor s/p resection  Past Medical History:  Diagnosis Date   Anxiety    BPH (benign prostatic hyperplasia)    Complication of anesthesia    Diabetes mellitus    DJD (degenerative joint disease)    GERD (gastroesophageal reflux disease)    HTN (hypertension)    Hyperlipidemia    ILD (interstitial lung disease) ([place])    Left renal mass    Melanoma (HCC)    Mild sleep apnea    Sleep study pending   PONV (postoperative nausea and vomiting)       Surgical History:  Past Surgical History:  Procedure Laterality Date   COLONOSCOPY WITH PROPOFOL N/A 04/03/2014   Procedure: COLONOSCOPY WITH PROPOFOL;  Surgeon: Garlan Fair, MD;  Location: [facility]  ENDOSCOPY;  Service: Endoscopy;  Laterality: N/A;   KNEE ARTHROSCOPY Right    x2 right, x5 left   LASIK     MELANOMA EXCISION Left    NEPHRECTOMY Left 2022   partial   PROSTATE ABLATION     RIGHT/LEFT HEART CATH AND CORONARY ANGIOGRAPHY N/A 01/15/2022   Procedure: RIGHT/LEFT HEART CATH AND CORONARY ANGIOGRAPHY;  Surgeon: Wellington Hampshire, MD;  Location: [facility];  Service: Cardiovascular;  Laterality: N/A;   TOTAL KNEE ARTHROPLASTY     left   VASECTOMY       Home Meds: Current Meds  Medication Sig   albuterol (VENTOLIN HFA) 108 (90 Base) MCG/ACT inhaler Inhale 1-2 puffs into the lungs every 6 (six) hours as needed for wheezing or shortness of breath.   apixaban (ELIQUIS) 5 MG TABS tablet Take 1 tablet (5 mg total) by mouth 2 (two) times daily.   atorvastatin (LIPITOR) 80 MG tablet Take 1 tablet (80 mg total) by mouth daily.   carvedilol (COREG) 3.125 MG tablet Take 3.125 mg by mouth 2 (two) times daily with a meal.   Cholecalciferol (VITAMIN D PO) Take 1 tablet by mouth every morning.   DULoxetine (CYMBALTA) 60 MG capsule Take 60  mg by mouth daily.   empagliflozin (JARDIANCE) 10 MG TABS tablet Take 1 tablet (10 mg total) by mouth daily before breakfast.   fluticasone (FLONASE) 50 MCG/ACT nasal spray Place 1 spray into both nostrils daily as needed for allergies or rhinitis.   furosemide (LASIX) 20 MG tablet Take 1 tablet (20 mg total) by mouth as needed. Great than 3lb in 24 hours or 5 lb in a week   insulin lispro (HUMALOG KWIKPEN) 100 UNIT/ML KwikPen Inject 12 Units into the skin 2 (two) times daily as needed (high blood sugar).   ipratropium (ATROVENT) 0.03 % nasal spray Place 2 sprays into the nose 2 (two) times daily.   levocetirizine (XYZAL) 5 MG tablet Take 5 mg by mouth daily.   metFORMIN (GLUCOPHAGE) 1000 MG tablet Take 1,000 mg by mouth 2 (two) times daily.    omeprazole (PRILOSEC) 20 MG capsule Take 1 tablet by mouth every morning.    oxymetazoline (AFRIN) 0.05 %  nasal spray Place 1 spray into both nostrils 2 (two) times daily as needed for congestion.   sacubitril-valsartan (ENTRESTO) 49-51 MG Take 1 tablet by mouth 2 (two) times daily.   spironolactone (ALDACTONE) 25 MG tablet Take 0.5 tablets (12.5 mg total) by mouth daily.   vitamin B-12 (CYANOCOBALAMIN) 100 MCG tablet Take 100 mcg by mouth daily.   zolpidem (AMBIEN) 10 MG tablet Take 10 mg by mouth as needed.    Allergies:  Allergies  Allergen Reactions   Codeine Nausea And Vomiting   Crestor [Rosuvastatin Calcium] Other (See Comments)    Leg aches   Erythromycin Rash    All Mycin drugs    Social History   Socioeconomic History   Marital status: Single    Spouse name: Not on file   Number of children: 0   Years of education: Not on file   Highest education level: High school graduate  Occupational History   Occupation: retired  Tobacco Use   Smoking status: Former    Packs/day: 1.00    Years: 50.00    Total pack years: 50.00    Types: Cigarettes    Quit date: 10/16/2021    Years since quitting: 0.6   Smokeless tobacco: Never  Vaping Use   Vaping Use: Never used  Substance and Sexual Activity   Alcohol use: Yes    Comment: rare occ.   Drug use: Yes    Types: Marijuana    Comment: 07/29/21 3 x week to help sleep   Sexual activity: Not on file  Other Topics Concern   Not on file  Social History Narrative   Lives alone.  Divorced.  Retired Sport and exercise psychologist.       Social Determinants of Health   Financial Resource Strain: Medium Risk (01/16/2022)   Overall Financial Resource Strain (CARDIA)    Difficulty of Paying Living Expenses: Somewhat hard  Food Insecurity: No Food Insecurity (01/16/2022)   Hunger Vital Sign    Worried About Running Out of Food in the Last Year: Never true    Ran Out of Food in the Last Year: Never true  Transportation Needs: No Transportation Needs (01/16/2022)   PRAPARE - Hydrologist (Medical): No    Lack of  Transportation (Non-Medical): No  Physical Activity: Not on file  Stress: Not on file  Social Connections: Not on file  Intimate Partner Violence: Not on file     Family History  Problem Relation Age of Onset   Lung cancer Mother  Heart disease Father    Asthma Sister    Asthma Sister    Colon cancer Sister    Rheum arthritis Sister        x3     ROS:  Please see the history of present illness.   \  All other systems reviewed and negative.    Physical Exam: Blood pressure 112/64, pulse 82, height 5' 9"$  (1.753 m), weight 190 lb (86.2 kg), SpO2 98 %. General: Well developed, well nourished male in no acute distress. Head: Normocephalic, atraumatic, sclera non-icteric, no xanthomas, nares are without discharge. EENT: normal  Lymph Nodes:  none Neck: Negative for carotid bruits. JVD not elevated. Back:without scoliosis kyphosis Lungs: Clear bilaterally to auscultation without wheezes, rales, or rhonchi. Breathing is unlabored. Heart: RRR with S1 S2. No   murmur . No rubs, or gallops appreciated. Abdomen: Soft, non-tender, non-distended with normoactive bowel sounds. No hepatomegaly. No rebound/guarding. No obvious abdominal masses. Msk:  Strength and tone appear normal for age. Extremities: No clubbing or cyanosis. No edema.  Distal pedal pulses are 2+ and equal bilaterally. Skin: Warm and Dry Neuro: Alert and oriented X 3. CN III-XII intact Grossly normal sensory and motor function . Psych:  Responds to questions appropriately with a normal affect.        EKG: Sinus rhythm at 85 Interval 17/10/39 Axis left -87 Q waves 1 and L and V2 consistent with anterior and possible anterolateral infarct Pulmonary disease pattern ECG 10/23 narrow QRS  Assessment and Plan:  Cardiomyopathy-mixed-ischemic nonischemic  Congestive heart failure-systolic-chronic  Anemia-new  Pulmonary embolism   Lengthy discussion regarding the role of an ICD for primary prevention in the setting  of a primarily ischemic cardiomyopathy and moderate congestive failure.  The issues of viability in the inferior wall and his right coronary artery and circumflex disease seems to be being newly addressed in light of his CPX.  The patient alludes to a phone call regarding catheterization and I guess the question as to whether they will attempt to deal with the CTO or the circumflex.  The discussion regarding his ICD related to absolute benefit of survival.  This number is hard to know in the era of Entresto but I estimated about 2 %/year, and the pre-Entresto/Aldactone  it was about 3 %/year.  He has a good understanding of his life goals and that at this point if death were to be preventable he would like to pursue that but is not quite convinced.  We agreed to meet in about 3 months to discuss that again  Obviously in light of the aforementioned references to repeat catheterization this would hopefully result in improvement in LV function and if not, but thereafter have an impact on timing.  He also has a persistent anemia prior to the initiation of anticoagulation.  It makes one wonder whether is not a more systemic process involved with the anemia, thrombogenesity, and cardiomyopathy, whatever portion may be nonischemic     Virl Axe

## 2022-06-25 ENCOUNTER — Telehealth (HOSPITAL_COMMUNITY): Payer: Self-pay | Admitting: Unknown Physician Specialty

## 2022-06-25 NOTE — Telephone Encounter (Signed)
Called pt to discuss possible RHC for this week. Pt tells me that he doesn't have anyone to come with him to his appt. We discussed advanced heart failure options/VAD in depth today. I had originally scheduled for the pt to have a RHC tomorrow 2/15. But after speaking with him on the phone I cancelled it. The pt has an appt with Dr Daniel Nones on Monday 2/19. Pt was informed at this time we will discuss VAD and the importance a having a caregiver for this therapy. If the pt has a caregiver and would like to pursue advanced heart failure options we can scheduled his RHC after his appt. Updated Dr Daniel Nones with this plan.  Tanda Rockers RN, BSN VAD Coordinator 24/7 Pager [phone]

## 2022-06-26 ENCOUNTER — Ambulatory Visit (HOSPITAL_COMMUNITY): Admission: RE | Admit: 2022-06-26 | Payer: Medicare Other | Source: Home / Self Care | Admitting: Cardiology

## 2022-06-26 ENCOUNTER — Encounter (HOSPITAL_COMMUNITY): Admission: RE | Payer: Self-pay | Source: Home / Self Care

## 2022-06-26 SURGERY — RIGHT HEART CATH
Anesthesia: LOCAL

## 2022-06-30 ENCOUNTER — Encounter (HOSPITAL_COMMUNITY): Payer: Medicare Other | Admitting: Cardiology

## 2022-07-09 DIAGNOSIS — K219 Gastro-esophageal reflux disease without esophagitis: Secondary | ICD-10-CM | POA: Diagnosis not present

## 2022-07-09 DIAGNOSIS — N181 Chronic kidney disease, stage 1: Secondary | ICD-10-CM | POA: Diagnosis not present

## 2022-07-09 DIAGNOSIS — E7800 Pure hypercholesterolemia, unspecified: Secondary | ICD-10-CM | POA: Diagnosis not present

## 2022-07-09 DIAGNOSIS — E1142 Type 2 diabetes mellitus with diabetic polyneuropathy: Secondary | ICD-10-CM | POA: Diagnosis not present

## 2022-07-09 DIAGNOSIS — I10 Essential (primary) hypertension: Secondary | ICD-10-CM | POA: Diagnosis not present

## 2022-07-24 ENCOUNTER — Other Ambulatory Visit (HOSPITAL_COMMUNITY): Payer: Self-pay

## 2022-07-24 ENCOUNTER — Encounter (HOSPITAL_COMMUNITY): Payer: Self-pay | Admitting: Cardiology

## 2022-07-24 ENCOUNTER — Ambulatory Visit (HOSPITAL_COMMUNITY)
Admission: RE | Admit: 2022-07-24 | Discharge: 2022-07-24 | Disposition: A | Discharge status: Home / Self Care | Payer: Medicare Other | Source: Ambulatory Visit | Attending: Cardiology | Admitting: Cardiology

## 2022-07-24 VITALS — BP 108/60 | HR 84 | Wt 190.6 lb

## 2022-07-24 DIAGNOSIS — Z79899 Other long term (current) drug therapy: Secondary | ICD-10-CM | POA: Diagnosis not present

## 2022-07-24 DIAGNOSIS — Z794 Long term (current) use of insulin: Secondary | ICD-10-CM | POA: Diagnosis not present

## 2022-07-24 DIAGNOSIS — J449 Chronic obstructive pulmonary disease, unspecified: Secondary | ICD-10-CM | POA: Insufficient documentation

## 2022-07-24 DIAGNOSIS — I2694 Multiple subsegmental pulmonary emboli without acute cor pulmonale: Secondary | ICD-10-CM | POA: Diagnosis not present

## 2022-07-24 DIAGNOSIS — Z905 Acquired absence of kidney: Secondary | ICD-10-CM | POA: Insufficient documentation

## 2022-07-24 DIAGNOSIS — I2510 Atherosclerotic heart disease of native coronary artery without angina pectoris: Secondary | ICD-10-CM

## 2022-07-24 DIAGNOSIS — I2699 Other pulmonary embolism without acute cor pulmonale: Secondary | ICD-10-CM | POA: Insufficient documentation

## 2022-07-24 DIAGNOSIS — E119 Type 2 diabetes mellitus without complications: Secondary | ICD-10-CM | POA: Diagnosis not present

## 2022-07-24 DIAGNOSIS — I110 Hypertensive heart disease with heart failure: Secondary | ICD-10-CM | POA: Diagnosis not present

## 2022-07-24 DIAGNOSIS — I5022 Chronic systolic (congestive) heart failure: Secondary | ICD-10-CM

## 2022-07-24 DIAGNOSIS — E1165 Type 2 diabetes mellitus with hyperglycemia: Secondary | ICD-10-CM

## 2022-07-24 DIAGNOSIS — Z7901 Long term (current) use of anticoagulants: Secondary | ICD-10-CM | POA: Insufficient documentation

## 2022-07-24 DIAGNOSIS — Z7985 Long-term (current) use of injectable non-insulin antidiabetic drugs: Secondary | ICD-10-CM | POA: Diagnosis not present

## 2022-07-24 DIAGNOSIS — F17210 Nicotine dependence, cigarettes, uncomplicated: Secondary | ICD-10-CM | POA: Diagnosis not present

## 2022-07-24 DIAGNOSIS — Z7984 Long term (current) use of oral hypoglycemic drugs: Secondary | ICD-10-CM | POA: Diagnosis not present

## 2022-07-24 LAB — BASIC METABOLIC PANEL
Anion gap: 8 (ref 5–15)
BUN: 22 mg/dL (ref 8–23)
CO2: 25 mmol/L (ref 22–32)
Calcium: 9.2 mg/dL (ref 8.9–10.3)
Chloride: 103 mmol/L (ref 98–111)
Creatinine, Ser: 1 mg/dL (ref 0.61–1.24)
GFR, Estimated: >60 mL/min (ref >60)
Glucose, Bld: 168 mg/dL — ABNORMAL HIGH (ref 70–99)
Potassium: 4.5 mmol/L (ref 3.5–5.1)
Sodium: 136 mmol/L (ref 135–145)

## 2022-07-24 LAB — CBC
HCT: 36.7 % — ABNORMAL LOW (ref 39.0–52.0)
Hemoglobin: 12.2 g/dL — ABNORMAL LOW (ref 13.0–17.0)
MCH: 30.8 pg (ref 26.0–34.0)
MCHC: 33.2 g/dL (ref 30.0–36.0)
MCV: 92.7 fL (ref 80.0–100.0)
Platelets: 230 10*3/uL (ref 150–400)
RBC: 3.96 MIL/uL — ABNORMAL LOW (ref 4.22–5.81)
RDW: 14.9 % (ref 11.5–15.5)
WBC: 7.8 10*3/uL (ref 4.0–10.5)
nRBC: 0 % (ref 0.0–0.2)

## 2022-07-24 LAB — BRAIN NATRIURETIC PEPTIDE: B Natriuretic Peptide: 276.6 pg/mL — ABNORMAL HIGH (ref 0.0–100.0)

## 2022-07-24 MED ORDER — SODIUM CHLORIDE 0.9% FLUSH: 3.0000 mL | Freq: Two times a day (BID) | INTRAVENOUS | Status: DC

## 2022-07-24 NOTE — Progress Notes (Signed)
ADVANCED HEART FAILURE CLINIC NOTE  Referring Physician: Lajean Manes, MD  Primary Care: Lajean Manes, MD Primary Cardiologist: N/A  HPI: Phillip Rich is a 72 y.o. male with T2DM, HTN, renal mass s/p nephrectomy, COPD that presented to the [facility] ER w/ acute onset SOB on 08/27 when he was diagnosed with acute PE and HFrEF w/ an LVEF of 25%-30%; during admission, coronary angiography was significant for CTO of the RCA and diffusely diseased Lcx. RHC w/ elevated pre and post capillary filling pressures. He was diuresed and discharged home and started on some GDMT. Since that time, Phillip Rich has been seen in [facility] clinic where medications were further adjusted.   Interval hx:  He has continued to travel.  No significant changes in his baseline functional status.  His functional status is still most consistent with NYHA III.  He is able to perform all ADLs without difficulty.  Still travels, goes to comedy shows but does become significantly short of breath when walking up inclines.  Activity level/exercise tolerance:  NYHA III Orthopnea:  Sleeps on 2 pillows Paroxysmal noctural dyspnea:  No Chest pain/pressure:  No Orthostatic lightheadedness:  No Palpitations:  No Lower extremity edema:  minimal Presyncope/syncope:  No Cough:  Intermittent  Past Medical History:  Diagnosis Date   Anxiety    BPH (benign prostatic hyperplasia)    Complication of anesthesia    Diabetes mellitus    DJD (degenerative joint disease)    GERD (gastroesophageal reflux disease)    HTN (hypertension)    Hyperlipidemia    ILD (interstitial lung disease) ([facility])    Left renal mass    Melanoma ([place])    Mild sleep apnea    Sleep study pending   PONV (postoperative nausea and vomiting)     Current Outpatient Medications  Medication Sig Dispense Refill   albuterol (VENTOLIN HFA) 108 (90 Base) MCG/ACT inhaler Inhale 1-2 puffs into the lungs every 6 (six) hours as needed for wheezing or shortness  of breath. 1 each 0   apixaban (ELIQUIS) 5 MG TABS tablet Take 1 tablet (5 mg total) by mouth 2 (two) times daily. 60 tablet 0   atorvastatin (LIPITOR) 80 MG tablet Take 1 tablet (80 mg total) by mouth daily. 30 tablet 0   carvedilol (COREG) 3.125 MG tablet Take 3.125 mg by mouth 2 (two) times daily with a meal.     Cholecalciferol (VITAMIN D PO) Take 1 tablet by mouth every morning.     DULoxetine (CYMBALTA) 60 MG capsule Take 60 mg by mouth daily.     empagliflozin (JARDIANCE) 10 MG TABS tablet Take 1 tablet (10 mg total) by mouth daily before breakfast. 30 tablet 0   fluticasone (FLONASE) 50 MCG/ACT nasal spray Place 1 spray into both nostrils daily as needed for allergies or rhinitis.     furosemide (LASIX) 20 MG tablet Take 1 tablet (20 mg total) by mouth as needed. Great than 3lb in 24 hours or 5 lb in a week 30 tablet 0   insulin lispro (HUMALOG KWIKPEN) 100 UNIT/ML KwikPen Inject 12 Units into the skin 2 (two) times daily as needed (high blood sugar).     levocetirizine (XYZAL) 5 MG tablet Take 5 mg by mouth daily.     metFORMIN (GLUCOPHAGE) 1000 MG tablet Take 1,000 mg by mouth 2 (two) times daily.      omeprazole (PRILOSEC) 20 MG capsule Take 1 tablet by mouth every morning.      oxymetazoline (AFRIN) 0.05 %  nasal spray Place 1 spray into both nostrils 2 (two) times daily as needed for congestion.     sacubitril-valsartan (ENTRESTO) 49-51 MG Take 1 tablet by mouth 2 (two) times daily. 60 tablet 11   Semaglutide,0.25 or 0.'5MG'$ /DOS, (OZEMPIC, 0.25 OR 0.5 MG/DOSE,) 2 MG/3ML SOPN Inject 0.5 mg into the skin once a week.     vitamin B-12 (CYANOCOBALAMIN) 100 MCG tablet Take 100 mcg by mouth daily.     zolpidem (AMBIEN) 10 MG tablet Take 10 mg by mouth as needed.     No current facility-administered medications for this encounter.   Facility-Administered Medications Ordered in Other Encounters  Medication Dose Route Frequency Provider Last Rate Last Admin   sodium chloride flush (NS) 0.9 %  injection 3 mL  3 mL Intravenous Q12H Madgie Dhaliwal, DO        Allergies  Allergen Reactions   Codeine Nausea And Vomiting   Crestor [Rosuvastatin Calcium] Other (See Comments)    Leg aches   Erythromycin Rash    All Mycin drugs      Social History   Socioeconomic History   Marital status: Single    Spouse name: Not on file   Number of children: 0   Years of education: Not on file   Highest education level: High school graduate  Occupational History   Occupation: retired  Tobacco Use   Smoking status: Former    Packs/day: 1.00    Years: 50.00    Additional pack years: 0.00    Total pack years: 50.00    Types: Cigarettes    Quit date: 10/16/2021    Years since quitting: 0.7   Smokeless tobacco: Never  Vaping Use   Vaping Use: Never used  Substance and Sexual Activity   Alcohol use: Yes    Comment: rare occ.   Drug use: Yes    Types: Marijuana    Comment: 07/29/21 3 x week to help sleep   Sexual activity: Not on file  Other Topics Concern   Not on file  Social History Narrative   Lives alone.  Divorced.  Retired Sport and exercise psychologist.       Social Determinants of Health   Financial Resource Strain: Medium Risk (01/16/2022)   Overall Financial Resource Strain (CARDIA)    Difficulty of Paying Living Expenses: Somewhat hard  Food Insecurity: No Food Insecurity (01/16/2022)   Hunger Vital Sign    Worried About Running Out of Food in the Last Year: Never true    Ran Out of Food in the Last Year: Never true  Transportation Needs: No Transportation Needs (01/16/2022)   PRAPARE - Hydrologist (Medical): No    Lack of Transportation (Non-Medical): No  Physical Activity: Not on file  Stress: Not on file  Social Connections: Not on file  Intimate Partner Violence: Not on file      Family History  Problem Relation Age of Onset   Lung cancer Mother    Heart disease Father    Asthma Sister    Asthma Sister    Colon cancer Sister     Rheum arthritis Sister        x3    PHYSICAL EXAM: Vitals:   07/24/22 1431  BP: 108/60  Pulse: 84  SpO2: 97%   GENERAL: Well nourished, well developed, and in no apparent distress at rest.  HEENT: Negative for arcus senilis or xanthelasma. There is no scleral icterus.  The mucous membranes are pink and moist.  NECK: Supple, No masses. Normal carotid upstrokes without bruits. No masses or thyromegaly.    CHEST: There are no chest wall deformities. There is no chest wall tenderness. Respirations are unlabored.  Lungs- CTA B/L CARDIAC:  JVP: 7 cm          Normal rate with regular rhythm. No murmurs, rubs or gallops.  Pulses are 2+ and symmetrical in upper and lower extremities. no edema.  ABDOMEN: Soft, non-tender, non-distended. There are no masses or hepatomegaly. There are normal bowel sounds.  EXTREMITIES: Warm and well perfused with no cyanosis, clubbing.  LYMPHATIC: No axillary or supraclavicular lymphadenopathy.  NEUROLOGIC: Patient is oriented x3 with no focal or lateralizing neurologic deficits.  PSYCH: Patients affect is appropriate, there is no evidence of anxiety or depression.  SKIN: Warm and dry; no lesions or wounds.   SKIN: Warm and dry; no lesions or wounds.   DATA REVIEW  ECG: 02/28/22: NSR  ECHO: TTE 01/10/22: LVEF 15%-20%, RV function moderately reduced. Moderate MR.   CATH: 01/15/22   Ost LM lesion is 20% stenosed.   Prox Cx to Dist Cx lesion is 70% stenosed.   Prox LAD to Mid LAD lesion is 30% stenosed.   Mid RCA lesion is 100% stenosed.   Prox RCA lesion is 70% stenosed.   1.  Significant two-vessel coronary artery disease with chronically occluded mid right coronary artery with left to right as well as bridging collaterals.  The left circumflex is tortuous and diffusely diseased in the midsegment. 2.  Left ventricular angiography was not performed.  EF was severely reduced by echo. 3.  Right heart catheterization showed significantly elevated right and  left-sided filling pressures, moderate to severe pulmonary hypertension and moderately reduced cardiac output.  CPX: 06/20/22: Mildly submax test but based on available data there appears to be a severe HF limitations with a blunted BP response to exercise and markedly elevated VeVCO2 slope. Consideration should be given to advanced HF therapies if clinically indicated.   ASSESSMENT & PLAN:  Heart failure with reduced EF, NYHA III Etiology of [id number], ischemic and nonischemic components; most recent RHC w/ cardiac index of 2.2L/min/m2 with elevated pre and post capillary filling pressures likely secondary to significant volume overload; I do not suspect he has a significant burden of fixed pulmonary hypertension (PVR ~2.9 Wood units).  NYHA class / AHA Stage:IIB-III Volume status & Diuretics:  Euvolemic, continue lasix '40mg'$  daily Vasodilators: continue  Entresto to 49/'51mg'$  BID Beta-Blocker: continue coreg 6.'25mg'$  BID [id number] 12.'5mg'$  daily Cardiometabolic:jardiance '10mg'$  daily; urinalysis negative for UTI Devices therapies & Valvulopathies: CMR w/ LVEF 20%.  Patient has met Dr. Caryl Comes for ICD evaluation.  In the interim he has had a very concerning CPX.  Before pursuing ICD we will plan for right heart cath and consideration for LVAD.  Advanced therapies: We had a very lengthy discussion today regarding advanced therapies.  Mr. Vankampen had a CPX with a peak VO2 of 10.9 and severely elevated VE/VCO2 slope.  I am concerned about his overall prognosis.  He meets criteria for stage D heart failure.  At this time he is okay with pursuing right heart catheterization and would like to meet one of our LVAD patients before proceeding any further.  Will schedule right heart cath.  No medication changes today.  2. Pulmonary embolism  - CT from 01/05/22 w/ acute PE involving lobar and segmental branches at the RLL, RML - apixaban '5mg'$  BID for 6 months. Plan to discontinue next month.   3.  COPD - has  not required home O2; stable today; does not report history of frequent exacerbations.  - Last PFTs from 2012, will repeat with CPX.   4. Tobacco abuse - Continuing to smoke 1/2PPD - Planning to cut back now.  Ashlye Oviedo Advanced Heart Failure Mechanical Circulatory Support

## 2022-07-24 NOTE — Patient Instructions (Signed)
There has been no changes to your medications.  Labs done today, your results will be available in MyChart, we will contact you for abnormal readings.  You are scheduled for a Cardiac Catheterization on Tuesday, March 26 with Dr.  Daniel Nones .  1. Please arrive at the [facility] (Main Entrance A) at [facility]: [address] [place], [place] [address] at 11:30 AM (This time is two hours before your procedure to ensure your preparation). Free valet parking service is available.   Special note: Every effort is made to have your procedure done on time. Please understand that emergencies sometimes delay scheduled procedures.  2. Diet: Do not eat solid foods after midnight.  The patient may have clear liquids until 5am upon the day of the procedure.  3. Medication instructions in preparation for your procedure:   Contrast Allergy: No  HOLD Ozempic 7days prior to procedure.  HOLD Jardiance and  Lasix the morning of procedure.  HOLD Metformin the morning of procedure and for 48 hours after the procedure.  On the morning of your procedure, take d any morning medicines NOT listed above.  You may use sips of water.  5. Plan for one night stay--bring personal belongings. 6. Bring a current list of your medications and current insurance cards. 7. You MUST have a responsible person to drive you home. 8. Someone MUST be with you the first 24 hours after you arrive home or your discharge will be delayed. 9. Please wear clothes that are easy to get on and off and wear slip-on shoes.  Your physician recommends that you schedule a follow-up appointment will be arranged after your procedure   If you have any questions or concerns before your next appointment please send [place] a message through [place] or call our office at [phone].    TO LEAVE A MESSAGE FOR THE NURSE SELECT OPTION 2, PLEASE LEAVE A MESSAGE INCLUDING: YOUR NAME DATE OF BIRTH CALL BACK NUMBER REASON FOR CALL**this is  important as we prioritize the call backs  YOU WILL RECEIVE A CALL BACK THE SAME DAY AS LONG AS YOU CALL BEFORE 4:00 PM At the [facility], you and your health needs are our priority. As part of our continuing mission to provide you with exceptional heart care, we have created designated Provider Care Teams. These Care Teams include your primary Cardiologist (physician) and Advanced Practice Providers (APPs- Physician Assistants and Nurse Practitioners) who all work together to provide you with the care you need, when you need it.   You may see any of the following providers on your designated Care Team at your next follow up: Dr Glori Bickers Dr Loralie Champagne Dr. Roxana Hires, NP Lyda Jester, [place] [facility] [place], [place] Forestine Na, NP Audry Riles, PharmD   Please be sure to bring in all your medications bottles to every appointment.    Thank you for choosing [facility]

## 2022-07-24 NOTE — H&P (View-Only) (Signed)
 ADVANCED HEART FAILURE CLINIC NOTE  Referring Physician: Stoneking, Hal, MD  Primary Care: Stoneking, Hal, MD Primary Cardiologist: N/A  HPI: Phillip Rich is a 72 y.o. male with T2DM, HTN, renal mass s/p nephrectomy, COPD that presented to the [facility] w/ acute onset SOB on 08/27 when he was diagnosed with acute PE and HFrEF w/ an LVEF of 25%-30%; during admission, coronary angiography was significant for CTO of the RCA and diffusely diseased Lcx. RHC w/ elevated pre and post capillary filling pressures. He was diuresed and discharged home and started on some GDMT. Since that time, Mr. Bognar has been seen in TOC clinic where medications were further adjusted.   Interval hx:  He has continued to travel.  No significant changes in his baseline functional status.  His functional status is still most consistent with NYHA III.  He is able to perform all ADLs without difficulty.  Still travels, goes to comedy shows but does become significantly short of breath when walking up inclines.  Activity level/exercise tolerance:  NYHA III Orthopnea:  Sleeps on 2 pillows Paroxysmal noctural dyspnea:  No Chest pain/pressure:  No Orthostatic lightheadedness:  No Palpitations:  No Lower extremity edema:  minimal Presyncope/syncope:  No Cough:  Intermittent  Past Medical History:  Diagnosis Date   Anxiety    BPH (benign prostatic hyperplasia)    Complication of anesthesia    Diabetes mellitus    DJD (degenerative joint disease)    GERD (gastroesophageal reflux disease)    HTN (hypertension)    Hyperlipidemia    ILD (interstitial lung disease) (HCC)    Left renal mass    Melanoma (HCC)    Mild sleep apnea    Sleep study pending   PONV (postoperative nausea and vomiting)     Current Outpatient Medications  Medication Sig Dispense Refill   albuterol (VENTOLIN HFA) 108 (90 Base) MCG/ACT inhaler Inhale 1-2 puffs into the lungs every 6 (six) hours as needed for wheezing or shortness  of breath. 1 each 0   apixaban (ELIQUIS) 5 MG TABS tablet Take 1 tablet (5 mg total) by mouth 2 (two) times daily. 60 tablet 0   atorvastatin (LIPITOR) 80 MG tablet Take 1 tablet (80 mg total) by mouth daily. 30 tablet 0   carvedilol (COREG) 3.125 MG tablet Take 3.125 mg by mouth 2 (two) times daily with a meal.     Cholecalciferol (VITAMIN D PO) Take 1 tablet by mouth every morning.     DULoxetine (CYMBALTA) 60 MG capsule Take 60 mg by mouth daily.     empagliflozin (JARDIANCE) 10 MG TABS tablet Take 1 tablet (10 mg total) by mouth daily before breakfast. 30 tablet 0   fluticasone (FLONASE) 50 MCG/ACT nasal spray Place 1 spray into both nostrils daily as needed for allergies or rhinitis.     furosemide (LASIX) 20 MG tablet Take 1 tablet (20 mg total) by mouth as needed. Great than 3lb in 24 hours or 5 lb in a week 30 tablet 0   insulin lispro (HUMALOG KWIKPEN) 100 UNIT/ML KwikPen Inject 12 Units into the skin 2 (two) times daily as needed (high blood sugar).     levocetirizine (XYZAL) 5 MG tablet Take 5 mg by mouth daily.     metFORMIN (GLUCOPHAGE) 1000 MG tablet Take 1,000 mg by mouth 2 (two) times daily.      omeprazole (PRILOSEC) 20 MG capsule Take 1 tablet by mouth every morning.      oxymetazoline (AFRIN) 0.05 %   nasal spray Place 1 spray into both nostrils 2 (two) times daily as needed for congestion.     sacubitril-valsartan (ENTRESTO) 49-51 MG Take 1 tablet by mouth 2 (two) times daily. 60 tablet 11   Semaglutide,0.25 or 0.5MG/DOS, (OZEMPIC, 0.25 OR 0.5 MG/DOSE,) 2 MG/3ML SOPN Inject 0.5 mg into the skin once a week.     vitamin B-12 (CYANOCOBALAMIN) 100 MCG tablet Take 100 mcg by mouth daily.     zolpidem (AMBIEN) 10 MG tablet Take 10 mg by mouth as needed.     No current facility-administered medications for this encounter.   Facility-Administered Medications Ordered in Other Encounters  Medication Dose Route Frequency Provider Last Rate Last Admin   sodium chloride flush (NS) 0.9 %  injection 3 mL  3 mL Intravenous Q12H Asya Derryberry, DO        Allergies  Allergen Reactions   Codeine Nausea And Vomiting   Crestor [Rosuvastatin Calcium] Other (See Comments)    Leg aches   Erythromycin Rash    All Mycin drugs      Social History   Socioeconomic History   Marital status: Single    Spouse name: Not on file   Number of children: 0   Years of education: Not on file   Highest education level: High school graduate  Occupational History   Occupation: retired  Tobacco Use   Smoking status: Former    Packs/day: 1.00    Years: 50.00    Additional pack years: 0.00    Total pack years: 50.00    Types: Cigarettes    Quit date: 10/16/2021    Years since quitting: 0.7   Smokeless tobacco: Never  Vaping Use   Vaping Use: Never used  Substance and Sexual Activity   Alcohol use: Yes    Comment: rare occ.   Drug use: Yes    Types: Marijuana    Comment: 07/29/21 3 x week to help sleep   Sexual activity: Not on file  Other Topics Concern   Not on file  Social History Narrative   Lives alone.  Divorced.  Retired wholesale distributor.       Social Determinants of Health   Financial Resource Strain: Medium Risk (01/16/2022)   Overall Financial Resource Strain (CARDIA)    Difficulty of Paying Living Expenses: Somewhat hard  Food Insecurity: No Food Insecurity (01/16/2022)   Hunger Vital Sign    Worried About Running Out of Food in the Last Year: Never true    Ran Out of Food in the Last Year: Never true  Transportation Needs: No Transportation Needs (01/16/2022)   PRAPARE - Transportation    Lack of Transportation (Medical): No    Lack of Transportation (Non-Medical): No  Physical Activity: Not on file  Stress: Not on file  Social Connections: Not on file  Intimate Partner Violence: Not on file      Family History  Problem Relation Age of Onset   Lung cancer Mother    Heart disease Father    Asthma Sister    Asthma Sister    Colon cancer Sister     Rheum arthritis Sister        x3    PHYSICAL EXAM: Vitals:   07/24/22 1431  BP: 108/60  Pulse: 84  SpO2: 97%   GENERAL: Well nourished, well developed, and in no apparent distress at rest.  HEENT: Negative for arcus senilis or xanthelasma. There is no scleral icterus.  The mucous membranes are pink and moist.     NECK: Supple, No masses. Normal carotid upstrokes without bruits. No masses or thyromegaly.    CHEST: There are no chest wall deformities. There is no chest wall tenderness. Respirations are unlabored.  Lungs- CTA B/L CARDIAC:  JVP: 7 cm          Normal rate with regular rhythm. No murmurs, rubs or gallops.  Pulses are 2+ and symmetrical in upper and lower extremities. no edema.  ABDOMEN: Soft, non-tender, non-distended. There are no masses or hepatomegaly. There are normal bowel sounds.  EXTREMITIES: Warm and well perfused with no cyanosis, clubbing.  LYMPHATIC: No axillary or supraclavicular lymphadenopathy.  NEUROLOGIC: Patient is oriented x3 with no focal or lateralizing neurologic deficits.  PSYCH: Patients affect is appropriate, there is no evidence of anxiety or depression.  SKIN: Warm and dry; no lesions or wounds.   SKIN: Warm and dry; no lesions or wounds.   DATA REVIEW  ECG: 02/28/22: NSR  ECHO: TTE 01/10/22: LVEF 15%-20%, RV function moderately reduced. Moderate MR.   CATH: 01/15/22   Ost LM lesion is 20% stenosed.   Prox Cx to Dist Cx lesion is 70% stenosed.   Prox LAD to Mid LAD lesion is 30% stenosed.   Mid RCA lesion is 100% stenosed.   Prox RCA lesion is 70% stenosed.   1.  Significant two-vessel coronary artery disease with chronically occluded mid right coronary artery with left to right as well as bridging collaterals.  The left circumflex is tortuous and diffusely diseased in the midsegment. 2.  Left ventricular angiography was not performed.  EF was severely reduced by echo. 3.  Right heart catheterization showed significantly elevated right and  left-sided filling pressures, moderate to severe pulmonary hypertension and moderately reduced cardiac output.  CPX: 06/20/22: Mildly submax test but based on available data there appears to be a severe HF limitations with a blunted BP response to exercise and markedly elevated VeVCO2 slope. Consideration should be given to advanced HF therapies if clinically indicated.   ASSESSMENT & PLAN:  Heart failure with reduced EF, NYHA III Etiology of HF:Mixed, ischemic and nonischemic components; most recent RHC w/ cardiac index of 2.2L/min/m2 with elevated pre and post capillary filling pressures likely secondary to significant volume overload; I do not suspect he has a significant burden of fixed pulmonary hypertension (PVR ~2.9 Wood units).  NYHA class / AHA Stage:IIB-III Volume status & Diuretics:  Euvolemic, continue lasix 40mg daily Vasodilators: continue  Entresto to 49/51mg BID Beta-Blocker: continue coreg 6.25mg BID MRA:spironolactone 12.5mg daily Cardiometabolic:jardiance 10mg daily; urinalysis negative for UTI Devices therapies & Valvulopathies: CMR w/ LVEF 20%.  Patient has met Dr. Klein for ICD evaluation.  In the interim he has had a very concerning CPX.  Before pursuing ICD we will plan for right heart cath and consideration for LVAD.  Advanced therapies: We had a very lengthy discussion today regarding advanced therapies.  Mr. Pieroni had a CPX with a peak VO2 of 10.9 and severely elevated VE/VCO2 slope.  I am concerned about his overall prognosis.  He meets criteria for stage D heart failure.  At this time he is okay with pursuing right heart catheterization and would like to meet one of our LVAD patients before proceeding any further.  Will schedule right heart cath.  No medication changes today.  2. Pulmonary embolism  - CT from 01/05/22 w/ acute PE involving lobar and segmental branches at the RLL, RML - apixaban 5mg BID for 6 months. Plan to discontinue next month.   3.   COPD - has  not required home O2; stable today; does not report history of frequent exacerbations.  - Last PFTs from 2012, will repeat with CPX.   4. Tobacco abuse - Continuing to smoke 1/2PPD - Planning to cut back now.  Halana Deisher Advanced Heart Failure Mechanical Circulatory Support 

## 2022-08-05 ENCOUNTER — Encounter (HOSPITAL_COMMUNITY)
Admission: RE | Disposition: A | Discharge status: Home / Self Care | Payer: Self-pay | Source: Home / Self Care | Attending: Cardiology

## 2022-08-05 ENCOUNTER — Ambulatory Visit (HOSPITAL_COMMUNITY)
Admission: RE | Admit: 2022-08-05 | Discharge: 2022-08-05 | Disposition: A | Discharge status: Home / Self Care | Payer: Medicare Other | Attending: Cardiology | Admitting: Cardiology

## 2022-08-05 DIAGNOSIS — I5022 Chronic systolic (congestive) heart failure: Secondary | ICD-10-CM | POA: Diagnosis not present

## 2022-08-05 DIAGNOSIS — I509 Heart failure, unspecified: Secondary | ICD-10-CM | POA: Diagnosis not present

## 2022-08-05 DIAGNOSIS — Z905 Acquired absence of kidney: Secondary | ICD-10-CM | POA: Insufficient documentation

## 2022-08-05 DIAGNOSIS — Z7901 Long term (current) use of anticoagulants: Secondary | ICD-10-CM | POA: Diagnosis not present

## 2022-08-05 DIAGNOSIS — Z79899 Other long term (current) drug therapy: Secondary | ICD-10-CM | POA: Insufficient documentation

## 2022-08-05 DIAGNOSIS — Z794 Long term (current) use of insulin: Secondary | ICD-10-CM | POA: Diagnosis not present

## 2022-08-05 DIAGNOSIS — E119 Type 2 diabetes mellitus without complications: Secondary | ICD-10-CM | POA: Insufficient documentation

## 2022-08-05 DIAGNOSIS — I2510 Atherosclerotic heart disease of native coronary artery without angina pectoris: Secondary | ICD-10-CM | POA: Insufficient documentation

## 2022-08-05 DIAGNOSIS — Z87891 Personal history of nicotine dependence: Secondary | ICD-10-CM | POA: Insufficient documentation

## 2022-08-05 DIAGNOSIS — I2582 Chronic total occlusion of coronary artery: Secondary | ICD-10-CM | POA: Diagnosis not present

## 2022-08-05 DIAGNOSIS — I110 Hypertensive heart disease with heart failure: Secondary | ICD-10-CM | POA: Diagnosis not present

## 2022-08-05 DIAGNOSIS — J449 Chronic obstructive pulmonary disease, unspecified: Secondary | ICD-10-CM | POA: Diagnosis not present

## 2022-08-05 DIAGNOSIS — Z7984 Long term (current) use of oral hypoglycemic drugs: Secondary | ICD-10-CM | POA: Diagnosis not present

## 2022-08-05 DIAGNOSIS — I2699 Other pulmonary embolism without acute cor pulmonale: Secondary | ICD-10-CM | POA: Diagnosis not present

## 2022-08-05 HISTORY — PX: RIGHT HEART CATH: CATH118263

## 2022-08-05 LAB — POCT I-STAT EG7
Acid-Base Excess: 1 mmol/L (ref 0.0–2.0)
Acid-Base Excess: 1 mmol/L (ref 0.0–2.0)
Bicarbonate: 26.1 mmol/L (ref 20.0–28.0)
Bicarbonate: 26.4 mmol/L (ref 20.0–28.0)
Calcium, Ion: 1.24 mmol/L (ref 1.15–1.40)
Calcium, Ion: 1.25 mmol/L (ref 1.15–1.40)
HCT: 36 % — ABNORMAL LOW (ref 39.0–52.0)
HCT: 37 % — ABNORMAL LOW (ref 39.0–52.0)
Hemoglobin: 12.2 g/dL — ABNORMAL LOW (ref 13.0–17.0)
Hemoglobin: 12.6 g/dL — ABNORMAL LOW (ref 13.0–17.0)
O2 Saturation: 61 %
O2 Saturation: 61 %
Potassium: 4.4 mmol/L (ref 3.5–5.1)
Potassium: 4.4 mmol/L (ref 3.5–5.1)
Sodium: 137 mmol/L (ref 135–145)
Sodium: 137 mmol/L (ref 135–145)
TCO2: 27 mmol/L (ref 22–32)
TCO2: 28 mmol/L (ref 22–32)
pCO2, Ven: 42.4 mmHg — ABNORMAL LOW (ref 44–60)
pCO2, Ven: 42.8 mmHg — ABNORMAL LOW (ref 44–60)
pH, Ven: 7.397 (ref 7.25–7.43)
pH, Ven: 7.398 (ref 7.25–7.43)
pO2, Ven: 32 mmHg (ref 32–45)
pO2, Ven: 32 mmHg (ref 32–45)

## 2022-08-05 LAB — GLUCOSE, CAPILLARY
Glucose-Capillary: 194 mg/dL — ABNORMAL HIGH (ref 70–99)
Glucose-Capillary: 178 mg/dL — ABNORMAL HIGH (ref 70–99)

## 2022-08-05 SURGERY — RIGHT HEART CATH
Anesthesia: LOCAL

## 2022-08-05 MED ORDER — ONDANSETRON HCL 4 MG/2ML IJ SOLN: 4.0000 mg | Freq: Four times a day (QID) | INTRAMUSCULAR | Status: DC | PRN

## 2022-08-05 MED ORDER — ASPIRIN 81 MG PO CHEW
81.0000 mg | CHEWABLE_TABLET | ORAL | Status: AC
  Administered 2022-08-05: 81 mg via ORAL
  Filled 2022-08-05: qty 1

## 2022-08-05 MED ORDER — LIDOCAINE HCL (PF) 1 % IJ SOLN
INTRAMUSCULAR | Status: DC | PRN
  Administered 2022-08-05: 2 mL

## 2022-08-05 MED ORDER — LABETALOL HCL 5 MG/ML IV SOLN: 10.0000 mg | INTRAVENOUS | Status: DC | PRN

## 2022-08-05 MED ORDER — ASPIRIN 81 MG PO CHEW: 81.0000 mg | CHEWABLE_TABLET | ORAL | Status: DC

## 2022-08-05 MED ORDER — SODIUM CHLORIDE 0.9 % IV SOLN: INTRAVENOUS | Status: DC

## 2022-08-05 MED ORDER — SODIUM CHLORIDE 0.9 % IV SOLN: 250.0000 mL | INTRAVENOUS | Status: DC | PRN

## 2022-08-05 MED ORDER — SODIUM CHLORIDE 0.9% FLUSH: 3.0000 mL | Freq: Two times a day (BID) | INTRAVENOUS | Status: DC

## 2022-08-05 MED ORDER — SODIUM CHLORIDE 0.9% FLUSH: 3.0000 mL | INTRAVENOUS | Status: DC | PRN

## 2022-08-05 MED ORDER — HEPARIN (PORCINE) IN NACL 1000-0.9 UT/500ML-% IV SOLN
INTRAVENOUS | Status: DC | PRN
  Administered 2022-08-05: 500 mL

## 2022-08-05 MED ORDER — HYDRALAZINE HCL 20 MG/ML IJ SOLN: 10.0000 mg | INTRAMUSCULAR | Status: DC | PRN

## 2022-08-05 MED ORDER — LIDOCAINE HCL (PF) 1 % IJ SOLN
INTRAMUSCULAR | Status: AC
  Filled 2022-08-05: qty 30

## 2022-08-05 MED ORDER — ACETAMINOPHEN 325 MG PO TABS: 650.0000 mg | ORAL_TABLET | ORAL | Status: DC | PRN

## 2022-08-05 SURGICAL SUPPLY — 5 items
CATH SWAN GANZ 7F STRAIGHT (CATHETERS) IMPLANT
GLIDESHEATH SLENDER 7FR .021G (SHEATH) IMPLANT
GUIDEWIRE .025 260CM (WIRE) IMPLANT
PACK CARDIAC CATHETERIZATION (CUSTOM PROCEDURE TRAY) ×1 IMPLANT
TRANSDUCER W/STOPCOCK (MISCELLANEOUS) ×1 IMPLANT

## 2022-08-05 NOTE — Interval H&P Note (Signed)
History and Physical Interval Note:  08/05/2022 7:54 PM  Phillip Rich  has presented today for surgery, with the diagnosis of HF.  The various methods of treatment have been discussed with the patient and family. After consideration of risks, benefits and other options for treatment, the patient has consented to  Procedure(s): RIGHT HEART CATH (N/A) as a surgical intervention.  The patient's history has been reviewed, patient examined, no change in status, stable for surgery.  I have reviewed the patient's chart and labs.  Questions were answered to the patient's satisfaction.     Shaunette Gassner

## 2022-08-05 NOTE — Discharge Instructions (Signed)

## 2022-08-06 ENCOUNTER — Encounter (HOSPITAL_COMMUNITY): Payer: Self-pay | Admitting: Cardiology

## 2022-08-19 DIAGNOSIS — E1165 Type 2 diabetes mellitus with hyperglycemia: Secondary | ICD-10-CM | POA: Diagnosis not present

## 2022-08-19 DIAGNOSIS — E1122 Type 2 diabetes mellitus with diabetic chronic kidney disease: Secondary | ICD-10-CM | POA: Diagnosis not present

## 2022-08-19 DIAGNOSIS — N181 Chronic kidney disease, stage 1: Secondary | ICD-10-CM | POA: Diagnosis not present

## 2022-08-19 DIAGNOSIS — E1142 Type 2 diabetes mellitus with diabetic polyneuropathy: Secondary | ICD-10-CM | POA: Diagnosis not present

## 2022-08-19 DIAGNOSIS — L659 Nonscarring hair loss, unspecified: Secondary | ICD-10-CM | POA: Diagnosis not present

## 2022-08-19 DIAGNOSIS — I10 Essential (primary) hypertension: Secondary | ICD-10-CM | POA: Diagnosis not present

## 2022-08-19 DIAGNOSIS — Z794 Long term (current) use of insulin: Secondary | ICD-10-CM | POA: Diagnosis not present

## 2022-08-22 DIAGNOSIS — K219 Gastro-esophageal reflux disease without esophagitis: Secondary | ICD-10-CM | POA: Diagnosis not present

## 2022-08-22 DIAGNOSIS — E7800 Pure hypercholesterolemia, unspecified: Secondary | ICD-10-CM | POA: Diagnosis not present

## 2022-08-22 DIAGNOSIS — E1140 Type 2 diabetes mellitus with diabetic neuropathy, unspecified: Secondary | ICD-10-CM | POA: Diagnosis not present

## 2022-08-22 DIAGNOSIS — Z23 Encounter for immunization: Secondary | ICD-10-CM | POA: Diagnosis not present

## 2022-08-22 DIAGNOSIS — I700 Atherosclerosis of aorta: Secondary | ICD-10-CM | POA: Diagnosis not present

## 2022-08-22 DIAGNOSIS — Z79899 Other long term (current) drug therapy: Secondary | ICD-10-CM | POA: Diagnosis not present

## 2022-08-22 DIAGNOSIS — J8410 Pulmonary fibrosis, unspecified: Secondary | ICD-10-CM | POA: Diagnosis not present

## 2022-08-22 DIAGNOSIS — I5022 Chronic systolic (congestive) heart failure: Secondary | ICD-10-CM | POA: Diagnosis not present

## 2022-08-22 DIAGNOSIS — Z86718 Personal history of other venous thrombosis and embolism: Secondary | ICD-10-CM | POA: Diagnosis not present

## 2022-08-22 DIAGNOSIS — Z0000 Encounter for general adult medical examination without abnormal findings: Secondary | ICD-10-CM | POA: Diagnosis not present

## 2022-08-22 DIAGNOSIS — I110 Hypertensive heart disease with heart failure: Secondary | ICD-10-CM | POA: Diagnosis not present

## 2022-09-09 ENCOUNTER — Encounter (HOSPITAL_COMMUNITY): Payer: Self-pay | Admitting: Cardiology

## 2022-09-09 ENCOUNTER — Other Ambulatory Visit (HOSPITAL_COMMUNITY): Payer: Self-pay

## 2022-09-09 ENCOUNTER — Ambulatory Visit (HOSPITAL_COMMUNITY)
Admission: RE | Admit: 2022-09-09 | Discharge: 2022-09-09 | Disposition: A | Discharge status: Home / Self Care | Payer: Medicare Other | Source: Ambulatory Visit | Attending: Cardiology | Admitting: Cardiology

## 2022-09-09 VITALS — BP 130/80 | HR 85 | Wt 191.8 lb

## 2022-09-09 DIAGNOSIS — Z79899 Other long term (current) drug therapy: Secondary | ICD-10-CM | POA: Insufficient documentation

## 2022-09-09 DIAGNOSIS — I2510 Atherosclerotic heart disease of native coronary artery without angina pectoris: Secondary | ICD-10-CM

## 2022-09-09 DIAGNOSIS — J449 Chronic obstructive pulmonary disease, unspecified: Secondary | ICD-10-CM | POA: Diagnosis not present

## 2022-09-09 DIAGNOSIS — Z794 Long term (current) use of insulin: Secondary | ICD-10-CM | POA: Insufficient documentation

## 2022-09-09 DIAGNOSIS — Z7984 Long term (current) use of oral hypoglycemic drugs: Secondary | ICD-10-CM | POA: Insufficient documentation

## 2022-09-09 DIAGNOSIS — I2694 Multiple subsegmental pulmonary emboli without acute cor pulmonale: Secondary | ICD-10-CM

## 2022-09-09 DIAGNOSIS — R0609 Other forms of dyspnea: Secondary | ICD-10-CM | POA: Diagnosis not present

## 2022-09-09 DIAGNOSIS — I2699 Other pulmonary embolism without acute cor pulmonale: Secondary | ICD-10-CM | POA: Diagnosis not present

## 2022-09-09 DIAGNOSIS — I110 Hypertensive heart disease with heart failure: Secondary | ICD-10-CM | POA: Insufficient documentation

## 2022-09-09 DIAGNOSIS — I5022 Chronic systolic (congestive) heart failure: Secondary | ICD-10-CM | POA: Diagnosis not present

## 2022-09-09 DIAGNOSIS — E119 Type 2 diabetes mellitus without complications: Secondary | ICD-10-CM | POA: Insufficient documentation

## 2022-09-09 DIAGNOSIS — I5084 End stage heart failure: Secondary | ICD-10-CM | POA: Diagnosis not present

## 2022-09-09 DIAGNOSIS — Z905 Acquired absence of kidney: Secondary | ICD-10-CM | POA: Diagnosis not present

## 2022-09-09 DIAGNOSIS — Z87891 Personal history of nicotine dependence: Secondary | ICD-10-CM | POA: Insufficient documentation

## 2022-09-09 LAB — BASIC METABOLIC PANEL
Anion gap: 8 (ref 5–15)
BUN: 21 mg/dL (ref 8–23)
CO2: 27 mmol/L (ref 22–32)
Calcium: 9.4 mg/dL (ref 8.9–10.3)
Chloride: 101 mmol/L (ref 98–111)
Creatinine, Ser: 1 mg/dL (ref 0.61–1.24)
GFR, Estimated: >60 mL/min (ref >60)
Glucose, Bld: 144 mg/dL — ABNORMAL HIGH (ref 70–99)
Potassium: 4.4 mmol/L (ref 3.5–5.1)
Sodium: 136 mmol/L (ref 135–145)

## 2022-09-09 LAB — BRAIN NATRIURETIC PEPTIDE: B Natriuretic Peptide: 357.6 pg/mL — ABNORMAL HIGH (ref 0.0–100.0)

## 2022-09-09 MED ORDER — LOSARTAN POTASSIUM 25 MG PO TABS: 25.0000 mg | ORAL_TABLET | Freq: Every day | ORAL | 7 refills | Status: DC

## 2022-09-09 MED ORDER — EMPAGLIFLOZIN 10 MG PO TABS: 10.0000 mg | ORAL_TABLET | Freq: Every day | ORAL | 3 refills | Status: DC

## 2022-09-09 MED ORDER — ATORVASTATIN CALCIUM 80 MG PO TABS
80.0000 mg | ORAL_TABLET | Freq: Every day | ORAL | 3 refills | Status: DC
  Filled 2022-09-09 (×2): qty 90, 90d supply, fill #0

## 2022-09-09 MED ORDER — CARVEDILOL 6.25 MG PO TABS
6.2500 mg | ORAL_TABLET | Freq: Two times a day (BID) | ORAL | 3 refills | Status: DC
  Filled 2022-09-09 (×2): qty 180, 90d supply, fill #0

## 2022-09-09 MED ORDER — FUROSEMIDE 20 MG PO TABS
20.0000 mg | ORAL_TABLET | ORAL | 4 refills | Status: DC | PRN
  Filled 2022-09-09: qty 30, fill #0
  Filled 2022-09-09: qty 30, 15d supply, fill #0

## 2022-09-09 MED ORDER — APIXABAN 5 MG PO TABS
5.0000 mg | ORAL_TABLET | Freq: Two times a day (BID) | ORAL | 3 refills | Status: DC
  Filled 2022-09-09: qty 60, 30d supply, fill #0
  Filled 2022-09-09 (×2): qty 180, 90d supply, fill #0

## 2022-09-09 MED ORDER — APIXABAN 5 MG PO TABS
5.0000 mg | ORAL_TABLET | Freq: Two times a day (BID) | ORAL | 11 refills | Status: DC
  Filled 2022-09-09: qty 60, 30d supply, fill #0

## 2022-09-09 MED ORDER — ENTRESTO 49-51 MG PO TABS
1.0000 | ORAL_TABLET | Freq: Two times a day (BID) | ORAL | 3 refills | Status: DC
  Filled 2022-09-09 (×2): qty 180, 90d supply, fill #0

## 2022-09-09 NOTE — Progress Notes (Signed)
ADVANCED HEART FAILURE CLINIC NOTE  Referring Physician: Merlene Laughter, MD  Primary Care: Merlene Laughter, MD Primary Cardiologist: N/A  HPI: Phillip Rich is a 72 y.o. male with T2DM, HTN, renal mass s/p nephrectomy, COPD that presented to the [facility] ER w/ acute onset SOB on 08/27 when he was diagnosed with acute PE and HFrEF w/ an LVEF of 25%-30%; during admission, coronary angiography was significant for CTO of the RCA and diffusely diseased Lcx. RHC w/ elevated pre and post capillary filling pressures. He was diuresed and discharged home and started on some GDMT. Since that time, Phillip Rich has had a CMR demonstrating persistently reduced LV function (EF 20%) and CPX with severe cardiac limitations. He is now been under evaluation for possibility of advanced therapies (LVAD, BAT wire, etc).   Interval hx:  From a functional standpoint Phillip Rich continues to do fairly well.  He continues to go to comedy shows and perform all ADLs independently and at this time does not feel significantly limited due to his underlying heart failure.  Minimal dyspnea on exertion, no lightheadedness, no chest pressure or lower extremity edema.  We had a lengthy discussion today about advanced therapies and the possibility of a bat wire.  At this time he is not interested  Activity level/exercise tolerance:  NYHA IIB-III Orthopnea:  Sleeps on 2 pillows Paroxysmal noctural dyspnea:  No Chest pain/pressure:  No Orthostatic lightheadedness:  No Palpitations:  No Lower extremity edema:  minimal Presyncope/syncope:  No Cough:  Intermittent  Past Medical History:  Diagnosis Date   Anxiety    BPH (benign prostatic hyperplasia)    Complication of anesthesia    Diabetes mellitus    DJD (degenerative joint disease)    GERD (gastroesophageal reflux disease)    HTN (hypertension)    Hyperlipidemia    ILD (interstitial lung disease) (HCC)    Left renal mass    Melanoma (HCC)    Mild sleep apnea     Sleep study pending   PONV (postoperative nausea and vomiting)     Current Outpatient Medications  Medication Sig Dispense Refill   albuterol (VENTOLIN HFA) 108 (90 Base) MCG/ACT inhaler Inhale 1-2 puffs into the lungs every 6 (six) hours as needed for wheezing or shortness of breath. 1 each 0   aspirin EC 81 MG tablet Take 81 mg by mouth daily. Swallow whole.     Cholecalciferol (VITAMIN D PO) Take 1,000 Units by mouth every morning.     DULoxetine (CYMBALTA) 60 MG capsule Take 60 mg by mouth daily.     fluticasone (FLONASE) 50 MCG/ACT nasal spray Place 1 spray into both nostrils 2 (two) times a week.     insulin lispro (HUMALOG KWIKPEN) 100 UNIT/ML KwikPen Inject 12 Units into the skin 2 (two) times daily.     levocetirizine (XYZAL) 5 MG tablet Take 5 mg by mouth daily.     Melatonin 10 MG TABS Take 10 mg by mouth at bedtime as needed (Sleep).     metFORMIN (GLUCOPHAGE) 1000 MG tablet Take 1,000 mg by mouth 2 (two) times daily.      omeprazole (PRILOSEC) 20 MG capsule Take 1 tablet by mouth every morning.      Semaglutide,0.25 or 0.5MG /DOS, (OZEMPIC, 0.25 OR 0.5 MG/DOSE,) 2 MG/3ML SOPN Inject 0.25 mg into the skin once a week.     vitamin B-12 (CYANOCOBALAMIN) 100 MCG tablet Take 100 mcg by mouth daily.     zolpidem (AMBIEN) 10 MG tablet Take  10 mg by mouth at bedtime as needed for sleep.     apixaban (ELIQUIS) 5 MG TABS tablet Take 1 tablet (5 mg total) by mouth 2 (two) times daily. 60 tablet 11   atorvastatin (LIPITOR) 80 MG tablet Take 1 tablet (80 mg total) by mouth daily. 90 tablet 3   carvedilol (COREG) 6.25 MG tablet Take 1 tablet (6.25 mg total) by mouth 2 (two) times daily with a meal. 180 tablet 3   empagliflozin (JARDIANCE) 10 MG TABS tablet Take 1 tablet (10 mg total) by mouth daily before breakfast. 90 tablet 3   furosemide (LASIX) 20 MG tablet Take 1 tablet (20 mg total) by mouth as needed for fluid or edema. Greater than 3lb in 24 hours or 5 lb in a week 30 tablet 4    sacubitril-valsartan (ENTRESTO) 49-51 MG Take 1 tablet by mouth 2 (two) times daily. 180 tablet 3   No current facility-administered medications for this encounter.    Allergies  Allergen Reactions   Codeine Nausea And Vomiting   Crestor [Rosuvastatin Calcium] Other (See Comments)    Leg aches   Erythromycin Rash    All Mycin drugs      Social History   Socioeconomic History   Marital status: Single    Spouse name: Not on file   Number of children: 0   Years of education: Not on file   Highest education level: High school graduate  Occupational History   Occupation: retired  Tobacco Use   Smoking status: Former    Packs/day: 1.00    Years: 50.00    Additional pack years: 0.00    Total pack years: 50.00    Types: Cigarettes    Quit date: 10/16/2021    Years since quitting: 0.9   Smokeless tobacco: Never  Vaping Use   Vaping Use: Never used  Substance and Sexual Activity   Alcohol use: Yes    Comment: rare occ.   Drug use: Yes    Types: Marijuana    Comment: 07/29/21 3 x week to help sleep   Sexual activity: Not on file  Other Topics Concern   Not on file  Social History Narrative   Lives alone.  Divorced.  Retired Engineer, technical sales.       Social Determinants of Health   Financial Resource Strain: Medium Risk (01/16/2022)   Overall Financial Resource Strain (CARDIA)    Difficulty of Paying Living Expenses: Somewhat hard  Food Insecurity: No Food Insecurity (01/16/2022)   Hunger Vital Sign    Worried About Running Out of Food in the Last Year: Never true    Ran Out of Food in the Last Year: Never true  Transportation Needs: No Transportation Needs (01/16/2022)   PRAPARE - Administrator, Civil Service (Medical): No    Lack of Transportation (Non-Medical): No  Physical Activity: Not on file  Stress: Not on file  Social Connections: Not on file  Intimate Partner Violence: Not on file      Family History  Problem Relation Age of Onset   Lung  cancer Mother    Heart disease Father    Asthma Sister    Asthma Sister    Colon cancer Sister    Rheum arthritis Sister        x3    PHYSICAL EXAM: Vitals:   09/09/22 1412  BP: 130/80  Pulse: 85  SpO2: 96%   GENERAL: Well nourished, well developed, and in no apparent distress at rest.  HEENT: Negative for arcus senilis or xanthelasma. There is no scleral icterus.  The mucous membranes are pink and moist.   NECK: Supple, No masses. Normal carotid upstrokes without bruits. No masses or thyromegaly.    CHEST: There are no chest wall deformities. There is no chest wall tenderness. Respirations are unlabored.  Lungs-CTA bilaterally CARDIAC:  JVP: 7 cm          Normal rate with regular rhythm. No murmurs, rubs or gallops.  Pulses are 2+ and symmetrical in upper and lower extremities.  No edema.  ABDOMEN: Soft, non-tender, non-distended. There are no masses or hepatomegaly. There are normal bowel sounds.  EXTREMITIES: Warm and well perfused with no cyanosis, clubbing.  LYMPHATIC: No axillary or supraclavicular lymphadenopathy.  NEUROLOGIC: Patient is oriented x3 with no focal or lateralizing neurologic deficits.  PSYCH: Patients affect is appropriate, there is no evidence of anxiety or depression.  SKIN: Warm and dry; no lesions or wounds.    DATA REVIEW  ECG: 02/28/22: NSR  ECHO: TTE 01/10/22: LVEF 15%-20%, RV function moderately reduced. Moderate MR.   CMR 04/21/22: 1. Moderate LV dilatation with severe systolic dysfunction (EF 20%). There is akinesis of basal inferior wall and thinning/akinesis of mid to apical anterior wall  2.  Normal RV size with mild systolic dysfunction (EF 40%) 3. Subendocardial late gadolinium enhancement consistent with prior infarct in basal inferior, mid to apical anterior, and apical lateral walls. LGE is less than 50% transmural in basal inferior wall, suggesting viability. LGE is greater than 50% transmural in mid to apical anterior and  apical lateral walls, suggesting nonviability in this region.  4.  Moderate tricuspid regurgitation (regurgitant fraction 23%)  5. Mild mitral regurgitation (regurgitant fraction 10%)  CATH: 01/15/22   Ost LM lesion is 20% stenosed.   Prox Cx to Dist Cx lesion is 70% stenosed.   Prox LAD to Mid LAD lesion is 30% stenosed.   Mid RCA lesion is 100% stenosed.   Prox RCA lesion is 70% stenosed.   1.  Significant two-vessel coronary artery disease with chronically occluded mid right coronary artery with left to right as well as bridging collaterals.  The left circumflex is tortuous and diffusely diseased in the midsegment. 2.  Left ventricular angiography was not performed.  EF was severely reduced by echo. 3.  Right heart catheterization showed significantly elevated right and left-sided filling pressures, moderate to severe pulmonary hypertension and moderately reduced cardiac output.  08/05/22: HEMODYNAMICS: RA:                  6 mmHg (mean) RV:                  27/4-6 mmHg PA:                  28/15 mmHg (21 mean) PCWP:            14 mmHg (mean)                                      Estimated Fick CO/CI   4.5 L/min, 2.2 L/min/m2 Thermodilution CO/CI   4.5 L/min, 2.2 L/min/m2  TPG                 7  mmHg                                              PVR                 1.5 Wood Units  PAPi                2.2    CPX: 06/20/22: Mildly submax test but based on available data there appears to be a severe HF limitations with a blunted BP response to exercise and markedly elevated VeVCO2 slope. Consideration should be given to advanced HF therapies if clinically indicated.   ASSESSMENT & PLAN:  Heart failure with reduced EF, NYHA III Etiology of ZO:XWRUE, ischemic and nonischemic components; most recent RHC w/ cardiac index of 2.2L/min/m2 with elevated pre and post capillary filling pressures likely secondary to significant volume overload; I do not  suspect he has a significant burden of fixed pulmonary hypertension (PVR ~2.9 Wood units).  NYHA class / AHA Stage:IIB-III Volume status & Diuretics:  Euvolemic, continue lasix 40mg  daily Vasodilators: continue  Entresto to 49/51mg  BID. Repeat labs today (BMP/BNP) Beta-Blocker: continue coreg 6.25mg  BID AVW:[id number] 12.5mg  daily Cardiometabolic:jardiance 10mg  daily; urinalysis negative for UTI Devices therapies & Valvulopathies: CMR w/ LVEF 20%.  Patient has met Dr. Graciela Husbands for ICD evaluation.  In the interim he has had a very concerning CPX.  Repeat right heart cath with mildly reduced cardiac index.  At this time patient not interested in pursuing advanced therapies or ICD. Advanced therapies:  Mr. Jaber had a CPX with a peak VO2 of 10.9 and severely elevated VE/VCO2 slope.  I am concerned about his overall prognosis.  He meets criteria for stage D heart failure.  Repeat right heart cath as above with mildly reduced cardiac index. At this time, Mr. Rockers does not wish to proceed with LVAD, ICD or BAT wire. We have spoken at length regarding the benefits/risks of each of these therapies. He would like to continue medical management only for the time being.   2. Pulmonary embolism  - CT from 01/05/22 w/ acute PE involving lobar and segmental branches at the RLL, RML - apixaban 5mg  BID for 6 months. Plan to discontinue next month.   3. COPD - has not required home O2; stable today; does not report history of frequent exacerbations.  - Last PFTs from 2012, will repeat with CPX.   4. Tobacco abuse - He has now stopped smoking.   Sherly Brodbeck Advanced Heart Failure Mechanical Circulatory Support

## 2022-09-09 NOTE — Patient Instructions (Addendum)
Lab Work:  Labs done today, your results will be available in MyChart, we will contact you for abnormal readings.  Your physician has requested that you have an echocardiogram. Echocardiography is a painless test that uses sound waves to create images of your heart. It provides your doctor with information about the size and shape of your heart and how well your heart's chambers and valves are working. This procedure takes approximately one hour. There are no restrictions for this procedure. Please do NOT wear cologne, perfume, aftershave, or lotions (deodorant is allowed). Please arrive 15 minutes prior to your appointment time.   the pharmacy will attempt one call and one mychart message and then will put it on hold until payment is received  [phone]   Follow-Up in:   Your physician recommends that you schedule a follow-up appointment in: 3 months   Do the following things EVERYDAY: Weigh yourself in the morning before breakfast. Write it down and keep it in a log. Take your medicines as prescribed Eat low salt foods--Limit salt (sodium) to 2000 mg per day.  Stay as active as you can everyday Limit all fluids for the day to less than 2 liters    Need to Contact us:  If you have any questions or concerns before your next appointment please send [place] a message through [place] or call our office at [phone].    TO LEAVE A MESSAGE FOR THE NURSE SELECT OPTION 2, PLEASE LEAVE A MESSAGE INCLUDING: YOUR NAME DATE OF BIRTH CALL BACK NUMBER REASON FOR CALL**this is important as we prioritize the call backs  YOU WILL RECEIVE A CALL BACK THE SAME DAY AS LONG AS YOU CALL BEFORE 4:00 PM   At the [facility], you and your health needs are our priority. As part of our continuing mission to provide you with exceptional heart care, we have created designated Provider Care Teams. These Care Teams include your primary Cardiologist (physician) and Advanced Practice  Providers (APPs- Physician Assistants and Nurse Practitioners) who all work together to provide you with the care you need, when you need it.   You may see any of the following providers on your designated Care Team at your next follow up: Dr Arvilla Meres Dr Marca Ancona Dr. Marcos Eke, NP Robbie Lis, [place] [facility] [place], [place] Brynda Peon, NP Karle Plumber, PharmD   Please be sure to bring in all your medications bottles to every appointment.    Thank you for choosing [facility]

## 2022-09-10 ENCOUNTER — Other Ambulatory Visit (HOSPITAL_COMMUNITY): Payer: Self-pay

## 2022-09-10 ENCOUNTER — Other Ambulatory Visit: Payer: Self-pay

## 2022-09-15 ENCOUNTER — Other Ambulatory Visit: Payer: Self-pay

## 2022-09-17 ENCOUNTER — Encounter (HOSPITAL_COMMUNITY): Payer: Self-pay

## 2022-09-17 ENCOUNTER — Other Ambulatory Visit (HOSPITAL_COMMUNITY): Payer: Self-pay

## 2022-09-17 ENCOUNTER — Other Ambulatory Visit (HOSPITAL_COMMUNITY): Payer: Self-pay | Admitting: Cardiology

## 2022-09-17 ENCOUNTER — Telehealth (HOSPITAL_COMMUNITY): Payer: Self-pay

## 2022-09-17 NOTE — Telephone Encounter (Signed)
Attempted to call patient in regards to Cardiac Rehab - LM on VM Mailed letter 

## 2022-09-18 ENCOUNTER — Other Ambulatory Visit (HOSPITAL_COMMUNITY): Payer: Self-pay

## 2022-09-18 MED ORDER — EMPAGLIFLOZIN 10 MG PO TABS
10.0000 mg | ORAL_TABLET | Freq: Every day | ORAL | 0 refills | Status: DC
  Filled 2022-09-18: qty 30, 30d supply, fill #0

## 2022-09-19 ENCOUNTER — Other Ambulatory Visit (HOSPITAL_COMMUNITY): Payer: Self-pay

## 2022-09-19 ENCOUNTER — Other Ambulatory Visit: Payer: Self-pay

## 2022-09-23 ENCOUNTER — Other Ambulatory Visit: Payer: Self-pay

## 2022-09-25 ENCOUNTER — Other Ambulatory Visit (HOSPITAL_COMMUNITY): Payer: Self-pay | Admitting: Cardiology

## 2022-09-25 ENCOUNTER — Other Ambulatory Visit: Payer: Self-pay

## 2022-09-25 ENCOUNTER — Other Ambulatory Visit (HOSPITAL_COMMUNITY): Payer: Self-pay

## 2022-09-25 MED ORDER — EMPAGLIFLOZIN 10 MG PO TABS
10.0000 mg | ORAL_TABLET | Freq: Every day | ORAL | 3 refills | Status: DC
  Filled 2022-09-25 – 2022-10-07 (×2): qty 90, 90d supply, fill #0
  Filled 2023-01-22: qty 90, 90d supply, fill #1

## 2022-09-26 ENCOUNTER — Other Ambulatory Visit: Payer: Self-pay

## 2022-10-03 DIAGNOSIS — I5022 Chronic systolic (congestive) heart failure: Secondary | ICD-10-CM | POA: Insufficient documentation

## 2022-10-03 DIAGNOSIS — I429 Cardiomyopathy, unspecified: Secondary | ICD-10-CM | POA: Insufficient documentation

## 2022-10-07 ENCOUNTER — Other Ambulatory Visit (HOSPITAL_COMMUNITY): Payer: Self-pay

## 2022-10-07 ENCOUNTER — Other Ambulatory Visit: Payer: Self-pay

## 2022-10-07 ENCOUNTER — Ambulatory Visit: Payer: Medicare Other | Admitting: Internal Medicine

## 2022-10-07 DIAGNOSIS — I5022 Chronic systolic (congestive) heart failure: Secondary | ICD-10-CM

## 2022-10-07 DIAGNOSIS — I428 Other cardiomyopathies: Secondary | ICD-10-CM

## 2022-11-25 ENCOUNTER — Ambulatory Visit (HOSPITAL_COMMUNITY)
Admission: RE | Admit: 2022-11-25 | Discharge: 2022-11-25 | Disposition: A | Discharge status: Home / Self Care | Payer: Medicare Other | Source: Ambulatory Visit | Attending: Cardiology | Admitting: Cardiology

## 2022-11-25 ENCOUNTER — Encounter (HOSPITAL_COMMUNITY): Payer: Self-pay | Admitting: Cardiology

## 2022-11-25 VITALS — BP 142/82 | HR 88 | Wt 189.2 lb

## 2022-11-25 DIAGNOSIS — Z794 Long term (current) use of insulin: Secondary | ICD-10-CM | POA: Diagnosis not present

## 2022-11-25 DIAGNOSIS — J449 Chronic obstructive pulmonary disease, unspecified: Secondary | ICD-10-CM | POA: Diagnosis not present

## 2022-11-25 DIAGNOSIS — Z7984 Long term (current) use of oral hypoglycemic drugs: Secondary | ICD-10-CM | POA: Diagnosis not present

## 2022-11-25 DIAGNOSIS — Z8249 Family history of ischemic heart disease and other diseases of the circulatory system: Secondary | ICD-10-CM | POA: Insufficient documentation

## 2022-11-25 DIAGNOSIS — I2510 Atherosclerotic heart disease of native coronary artery without angina pectoris: Secondary | ICD-10-CM

## 2022-11-25 DIAGNOSIS — I110 Hypertensive heart disease with heart failure: Secondary | ICD-10-CM | POA: Insufficient documentation

## 2022-11-25 DIAGNOSIS — I5022 Chronic systolic (congestive) heart failure: Secondary | ICD-10-CM | POA: Diagnosis not present

## 2022-11-25 DIAGNOSIS — Z5986 Financial insecurity: Secondary | ICD-10-CM | POA: Insufficient documentation

## 2022-11-25 DIAGNOSIS — I2699 Other pulmonary embolism without acute cor pulmonale: Secondary | ICD-10-CM | POA: Diagnosis not present

## 2022-11-25 DIAGNOSIS — Z7901 Long term (current) use of anticoagulants: Secondary | ICD-10-CM | POA: Diagnosis not present

## 2022-11-25 DIAGNOSIS — Z905 Acquired absence of kidney: Secondary | ICD-10-CM | POA: Insufficient documentation

## 2022-11-25 DIAGNOSIS — E119 Type 2 diabetes mellitus without complications: Secondary | ICD-10-CM | POA: Insufficient documentation

## 2022-11-25 DIAGNOSIS — I2694 Multiple subsegmental pulmonary emboli without acute cor pulmonale: Secondary | ICD-10-CM | POA: Diagnosis not present

## 2022-11-25 DIAGNOSIS — Z87891 Personal history of nicotine dependence: Secondary | ICD-10-CM | POA: Insufficient documentation

## 2022-11-25 DIAGNOSIS — Z79899 Other long term (current) drug therapy: Secondary | ICD-10-CM | POA: Insufficient documentation

## 2022-11-25 LAB — BRAIN NATRIURETIC PEPTIDE: B Natriuretic Peptide: 1548.2 pg/mL — ABNORMAL HIGH (ref 0.0–100.0)

## 2022-11-25 LAB — BASIC METABOLIC PANEL
Anion gap: 8 (ref 5–15)
BUN: 12 mg/dL (ref 8–23)
CO2: 27 mmol/L (ref 22–32)
Calcium: 8.6 mg/dL — ABNORMAL LOW (ref 8.9–10.3)
Chloride: 102 mmol/L (ref 98–111)
Creatinine, Ser: 1.02 mg/dL (ref 0.61–1.24)
GFR, Estimated: >60 mL/min (ref >60)
Glucose, Bld: 161 mg/dL — ABNORMAL HIGH (ref 70–99)
Potassium: 3.9 mmol/L (ref 3.5–5.1)
Sodium: 137 mmol/L (ref 135–145)

## 2022-11-25 LAB — ECHOCARDIOGRAM COMPLETE
AR max vel: 2.74 cm2
AV Area VTI: 2.67 cm2
AV Area mean vel: 2.68 cm2
AV Mean grad: 2 mmHg
AV Peak grad: 3.8 mmHg
Ao pk vel: 0.98 m/s
Area-P 1/2: 4.15 cm2
S' Lateral: 4.5 cm

## 2022-11-25 MED ORDER — ENTRESTO 97-103 MG PO TABS: 1.0000 | ORAL_TABLET | Freq: Two times a day (BID) | ORAL | 5 refills | Status: DC

## 2022-11-25 NOTE — Patient Instructions (Signed)
Medication Changes:  STOP TAKING APIXABAN  TAKE EXTRA DOSE OF LASIX TODAY   INCREASE ENTRESTO TO 97/103MG  TWICE DAILY   Lab Work:  Labs done today, your results will be available in MyChart, we will contact you for abnormal readings.   Follow-Up in: 2 MONTHS AS SCHEDULED WITH DR. Gasper Lloyd   At the [facility], you and your health needs are our priority. We have a designated team specialized in the treatment of Heart Failure. This Care Team includes your primary Heart Failure Specialized Cardiologist (physician), Advanced Practice Providers (APPs- Physician Assistants and Nurse Practitioners), and Pharmacist who all work together to provide you with the care you need, when you need it.   You may see any of the following providers on your designated Care Team at your next follow up:  Dr. Arvilla Meres Dr. Marca Ancona Dr. Marcos Eke, NP Robbie Lis, [place] [facility] [place], [place] Brynda Peon, NP Karle Plumber, PharmD   Please be sure to bring in all your medications bottles to every appointment.   Need to Contact us:  If you have any questions or concerns before your next appointment please send [place] a message through [place] or call our office at [phone].    TO LEAVE A MESSAGE FOR THE NURSE SELECT OPTION 2, PLEASE LEAVE A MESSAGE INCLUDING: YOUR NAME DATE OF BIRTH CALL BACK NUMBER REASON FOR CALL**this is important as we prioritize the call backs  YOU WILL RECEIVE A CALL BACK THE SAME DAY AS LONG AS YOU CALL BEFORE 4:00 PM

## 2022-11-25 NOTE — Progress Notes (Signed)
*  PRELIMINARY RESULTS* Echocardiogram 2D Echocardiogram has been performed.  Phillip Rich 11/25/2022, 1:53 PM

## 2022-11-25 NOTE — Progress Notes (Signed)
ADVANCED HE[facility] FAILURE CLINIC NOTE  Referring Physician: Merlene Laughter, MD  Primary Care: Merlene Laughter, MD (Inactive) Primary Cardiologist: N/A  HPI: Phillip Rich is a 72 y.o. male with T2DM, HTN, renal mass s/p nephrectomy, COPD that presented to the [facility] ER w/ acute onset SOB on 08/27 when he was diagnosed with acute PE and HFrEF w/ an LVEF of 25%-30%; during admission, coronary angiography was significant for CTO of the RCA and diffusely diseased Lcx. RHC w/ elevated pre and post capillary filling pressures. He was diuresed and discharged home and started on some GDMT. Since that time, Phillip Rich has had a CMR demonstrating persistently reduced LV function (EF 20%) and CPX with severe cardiac limitations. He is now been under evaluation for possibility of advanced therapies (LVAD, BAT wire, etc).   Interval hx:  - Symptoms are stable at this time; minimal dyspnea, however, feels fatigued throughout the day.  - No recent hospitalizations or LE edema. Planning to travel to [place] next month.   Activity level/exercise tolerance:  NYHA IIB-III Orthopnea:  Sleeps on 2 pillows Paroxysmal noctural dyspnea:  No Chest pain/pressure:  No Orthostatic lightheadedness:  No Palpitations:  No Lower extremity edema:  minimal Presyncope/syncope:  No Cough:  Intermittent  Past Medical History:  Diagnosis Date   Anxiety    BPH (benign prostatic hyperplasia)    Complication of anesthesia    Diabetes mellitus    DJD (degenerative joint disease)    GERD (gastroesophageal reflux disease)    HTN (hypertension)    Hyperlipidemia    ILD (interstitial lung disease) (HCC)    Left renal mass    Melanoma (HCC)    Mild sleep apnea    Sleep study pending   PONV (postoperative nausea and vomiting)     Current Outpatient Medications  Medication Sig Dispense Refill   albuterol (VENTOLIN HFA) 108 (90 Base) MCG/ACT inhaler Inhale 1-2 puffs into the lungs every 6 (six) hours as needed for  wheezing or shortness of breath. 1 each 0   apixaban (ELIQUIS) 5 MG TABS tablet Take 1 tablet (5 mg total) by mouth 2 (two) times daily. 60 tablet 11   aspirin EC 81 MG tablet Take 81 mg by mouth daily. Swallow whole.     atorvastatin (LIPITOR) 80 MG tablet Take 1 tablet (80 mg total) by mouth daily. 90 tablet 3   carvedilol (COREG) 6.25 MG tablet Take 1 tablet (6.25 mg total) by mouth 2 (two) times daily with a meal. 180 tablet 3   Cholecalciferol (VITAMIN D PO) Take 1,000 Units by mouth every morning.     DULoxetine (CYMBALTA) 60 MG capsule Take 60 mg by mouth daily.     empagliflozin (JARDIANCE) 10 MG TABS tablet Take 1 tablet (10 mg total) by mouth daily before breakfast. 90 tablet 3   fluticasone (FLONASE) 50 MCG/ACT nasal spray Place 1 spray into both nostrils 2 (two) times a week.     furosemide (LASIX) 20 MG tablet Take 1 tablet (20 mg total) by mouth as needed for fluid or edema. Greater than 3lb in 24 hours or 5 lb in a week 30 tablet 4   insulin lispro (HUMALOG KWIKPEN) 100 UNIT/ML KwikPen Inject 12 Units into the skin 2 (two) times daily.     levocetirizine (XYZAL) 5 MG tablet Take 5 mg by mouth daily.     Melatonin 10 MG TABS Take 10 mg by mouth at bedtime as needed (Sleep).     metFORMIN (GLUCOPHAGE) 1000  MG tablet Take 1,000 mg by mouth 2 (two) times daily.      omeprazole (PRILOSEC) 20 MG capsule Take 1 tablet by mouth every morning.      sacubitril-valsartan (ENTRESTO) 49-51 MG Take 1 tablet by mouth 2 (two) times daily. 180 tablet 3   Semaglutide,0.25 or 0.5MG /DOS, (OZEMPIC, 0.25 OR 0.5 MG/DOSE,) 2 MG/3ML SOPN Inject 0.25 mg into the skin once a week.     vitamin B-12 (CYANOCOBALAMIN) 100 MCG tablet Take 100 mcg by mouth daily.     zolpidem (AMBIEN) 10 MG tablet Take 10 mg by mouth at bedtime as needed for sleep.     No current facility-administered medications for this encounter.    Allergies  Allergen Reactions   Codeine Nausea And Vomiting   Crestor [Rosuvastatin  Calcium] Other (See Comments)    Leg aches   Erythromycin Rash    All Mycin drugs      Social History   Socioeconomic History   Marital status: Single    Spouse name: Not on file   Number of children: 0   Years of education: Not on file   Highest education level: High school graduate  Occupational History   Occupation: retired  Tobacco Use   Smoking status: Former    Current packs/day: 0.00    Average packs/day: 1 pack/day for 50.0 years (50.0 ttl pk-yrs)    Types: Cigarettes    Start date: 10/17/1971    Quit date: 10/16/2021    Years since quitting: 1.1   Smokeless tobacco: Never  Vaping Use   Vaping status: Never Used  Substance and Sexual Activity   Alcohol use: Yes    Comment: rare occ.   Drug use: Yes    Types: Marijuana    Comment: 07/29/21 3 x week to help sleep   Sexual activity: Not on file  Other Topics Concern   Not on file  Social History Narrative   Lives alone.  Divorced.  Retired Engineer, technical sales.       Social Determinants of Health   Financial Resource Strain: Medium Risk (01/16/2022)   Overall Financial Resource Strain (CARDIA)    Difficulty of Paying Living Expenses: Somewhat hard  Food Insecurity: No Food Insecurity (01/16/2022)   Hunger Vital Sign    Worried About Running Out of Food in the Last Year: Never true    Ran Out of Food in the Last Year: Never true  Transportation Needs: No Transportation Needs (01/16/2022)   PRAPARE - Administrator, Civil Service (Medical): No    Lack of Transportation (Non-Medical): No  Physical Activity: Not on file  Stress: Not on file  Social Connections: Not on file  Intimate Partner Violence: Not on file      Family History  Problem Relation Age of Onset   Lung cancer Mother    Heart disease Father    Asthma Sister    Asthma Sister    Colon cancer Sister    Rheum arthritis Sister        x3    PHYSICAL EXAM: Vitals:   11/25/22 1427  BP: (!) 142/82  Pulse: 88  SpO2: 97%   GENERAL:  Well nourished, well developed, and in no apparent distress at rest.  HEENT: Negative for arcus senilis or xanthelasma. There is no scleral icterus.  The mucous membranes are pink and moist.   NECK: Supple, No masses. Normal carotid upstrokes without bruits. No masses or thyromegaly.    CHEST: There are no chest  wall deformities. There is no chest wall tenderness. Respirations are unlabored.  Lungs- CTA B/L CARDIAC:  JVP: 7 cm          Normal rate with regular rhythm. No murmurs, rubs or gallops.  Pulses are 2+ and symmetrical in upper and lower extremities. No edema.  ABDOMEN: Soft, non-tender, non-distended. There are no masses or hepatomegaly. There are normal bowel sounds.  EXTREMITIES: Warm and well perfused with no cyanosis, clubbing.  LYMPHATIC: No axillary or supraclavicular lymphadenopathy.  NEUROLOGIC: Patient is oriented x3 with no focal or lateralizing neurologic deficits.  PSYCH: Patients affect is appropriate, there is no evidence of anxiety or depression.  SKIN: Warm and dry; no lesions or wounds.     DATA REVIEW  ECG: 02/28/22: NSR  ECHO: TTE 01/10/22: LVEF 15%-20%, RV function moderately reduced. Moderate MR.  TTE 11/25/22: LVEF 30%,   CMR 04/21/22: 1. Moderate LV dilatation with severe systolic dysfunction (EF 20%). There is akinesis of basal inferior wall and thinning/akinesis of mid to apical anterior wall  2.  Normal RV size with mild systolic dysfunction (EF 40%) 3. Subendocardial late gadolinium enhancement consistent with prior infarct in basal inferior, mid to apical anterior, and apical lateral walls. LGE is less than 50% transmural in basal inferior wall, suggesting viability. LGE is greater than 50% transmural in mid to apical anterior and apical lateral walls, suggesting nonviability in this region.  4.  Moderate tricuspid regurgitation (regurgitant fraction 23%)  5. Mild mitral regurgitation (regurgitant fraction 10%)  CATH: 01/15/22   Ost LM lesion  is 20% stenosed.   Prox Cx to Dist Cx lesion is 70% stenosed.   Prox LAD to Mid LAD lesion is 30% stenosed.   Mid RCA lesion is 100% stenosed.   Prox RCA lesion is 70% stenosed.   1.  Significant two-vessel coronary artery disease with chronically occluded mid right coronary artery with left to right as well as bridging collaterals.  The left circumflex is tortuous and diffusely diseased in the midsegment. 2.  Left ventricular angiography was not performed.  EF was severely reduced by echo. 3.  Right heart catheterization showed significantly elevated right and left-sided filling pressures, moderate to severe pulmonary hypertension and moderately reduced cardiac output.  08/05/22: HEMODYNAMICS: RA:                  6 mmHg (mean) RV:                  27/4-6 mmHg PA:                  28/15 mmHg (21 mean) PCWP:            14 mmHg (mean)                                      Estimated Fick CO/CI   4.5 L/min, 2.2 L/min/m2 Thermodilution CO/CI   4.5 L/min, 2.2 L/min/m2                                              TPG                 7  mmHg  PVR                 1.5 Wood Units  PAPi                2.2    CPX: 06/20/22: Mildly submax test but based on available data there appears to be a severe HF limitations with a blunted BP response to exercise and markedly elevated VeVCO2 slope. Consideration should be given to advanced HF therapies if clinically indicated.   ASSESSMENT & PLAN:  Heart failure with reduced EF, NYHA III Etiology of UJ:WJXBJ, ischemic and nonischemic components; most recent RHC w/ cardiac index of 2.2L/min/m2 with elevated pre and post capillary filling pressures likely secondary to significant volume overload; I do not suspect he has a significant burden of fixed pulmonary hypertension (PVR ~2.9 Wood units).  NYHA class / AHA Stage:IIB-III Volume status & Diuretics:  Euvolemic, continue lasix 20mg  daily; weight ranges from 180-185lbs  at home.  Vasodilators: Increase entresto to 97/103mg BID, repeat labs today.  Beta-Blocker: continue coreg 6.25mg  BID YNW:[id number] 12.5mg  daily Cardiometabolic:jardiance 10mg  daily; urinalysis negative for UTI Devices therapies & Valvulopathies: CMR w/ LVEF 20%.  Patient has met Dr. Graciela Husbands for ICD evaluation.  In the interim he has had a very concerning CPX.  Repeat right heart cath with mildly reduced cardiac index.  At this time patient not interested in pursuing advanced therapies or ICD. Repeat TTE on 11/25/22 w/ LVEF of 30%.  Advanced therapies:  Mr. Ricketson had a CPX with a peak VO2 of 10.9 and severely elevated VE/VCO2 slope.  I am concerned about his overall prognosis.  He meets criteria for stage D heart failure.  Repeat right heart cath as above with mildly reduced cardiac index. At this time, Mr. Hemmerich does not wish to proceed with LVAD, ICD or BAT wire. We have spoken at length regarding the benefits/risks of each of these therapies. He would like to continue medical management only for the time being.   2. Pulmonary embolism  - CT from 01/05/22 w/ acute PE involving lobar and segmental branches at the RLL, RML - D/C apixaban; completed close to 1 year of therapy.   3. COPD - has not required home O2; stable today; does not report history of frequent exacerbations.  - Last PFTs from 2012, will repeat with CPX.   4. Tobacco abuse - He has now stopped smoking.   Elaijah Munoz [facility] Mechanical Circulatory Support

## 2023-01-16 DIAGNOSIS — J208 Acute bronchitis due to other specified organisms: Secondary | ICD-10-CM | POA: Diagnosis not present

## 2023-01-16 DIAGNOSIS — B9689 Other specified bacterial agents as the cause of diseases classified elsewhere: Secondary | ICD-10-CM | POA: Diagnosis not present

## 2023-01-20 ENCOUNTER — Ambulatory Visit
Admission: RE | Admit: 2023-01-20 | Discharge: 2023-01-20 | Disposition: A | Discharge status: Home / Self Care | Payer: Medicare Other | Source: Ambulatory Visit | Attending: Physician Assistant | Admitting: Physician Assistant

## 2023-01-20 ENCOUNTER — Other Ambulatory Visit: Payer: Self-pay | Admitting: Physician Assistant

## 2023-01-20 DIAGNOSIS — R059 Cough, unspecified: Secondary | ICD-10-CM

## 2023-01-20 DIAGNOSIS — E1121 Type 2 diabetes mellitus with diabetic nephropathy: Secondary | ICD-10-CM | POA: Diagnosis not present

## 2023-01-20 DIAGNOSIS — R609 Edema, unspecified: Secondary | ICD-10-CM | POA: Diagnosis not present

## 2023-01-20 DIAGNOSIS — J4489 Other specified chronic obstructive pulmonary disease: Secondary | ICD-10-CM | POA: Diagnosis not present

## 2023-01-22 ENCOUNTER — Other Ambulatory Visit (HOSPITAL_COMMUNITY): Payer: Self-pay

## 2023-01-22 ENCOUNTER — Other Ambulatory Visit (HOSPITAL_BASED_OUTPATIENT_CLINIC_OR_DEPARTMENT_OTHER): Payer: Self-pay

## 2023-01-27 ENCOUNTER — Inpatient Hospital Stay (HOSPITAL_COMMUNITY)
Admission: EM | Admit: 2023-01-27 | Discharge: 2023-02-04 | DRG: 286 | Disposition: A | Discharge status: Home Health | Payer: Medicare Other | Source: Ambulatory Visit | Attending: Internal Medicine | Admitting: Internal Medicine

## 2023-01-27 ENCOUNTER — Emergency Department (HOSPITAL_COMMUNITY): Payer: Medicare Other

## 2023-01-27 ENCOUNTER — Encounter (HOSPITAL_COMMUNITY): Payer: Self-pay | Admitting: Cardiology

## 2023-01-27 ENCOUNTER — Encounter (HOSPITAL_COMMUNITY): Payer: Self-pay | Admitting: Emergency Medicine

## 2023-01-27 ENCOUNTER — Other Ambulatory Visit: Payer: Self-pay

## 2023-01-27 ENCOUNTER — Ambulatory Visit (HOSPITAL_BASED_OUTPATIENT_CLINIC_OR_DEPARTMENT_OTHER)
Admission: RE | Admit: 2023-01-27 | Discharge: 2023-01-27 | Disposition: A | Discharge status: Home / Self Care | Payer: Medicare Other | Source: Ambulatory Visit | Attending: Cardiology | Admitting: Cardiology

## 2023-01-27 VITALS — BP 124/82 | HR 99 | Wt 190.6 lb

## 2023-01-27 DIAGNOSIS — G4700 Insomnia, unspecified: Secondary | ICD-10-CM | POA: Diagnosis not present

## 2023-01-27 DIAGNOSIS — K219 Gastro-esophageal reflux disease without esophagitis: Secondary | ICD-10-CM | POA: Diagnosis not present

## 2023-01-27 DIAGNOSIS — J84112 Idiopathic pulmonary fibrosis: Secondary | ICD-10-CM | POA: Diagnosis present

## 2023-01-27 DIAGNOSIS — E1165 Type 2 diabetes mellitus with hyperglycemia: Secondary | ICD-10-CM

## 2023-01-27 DIAGNOSIS — I255 Ischemic cardiomyopathy: Secondary | ICD-10-CM | POA: Insufficient documentation

## 2023-01-27 DIAGNOSIS — Z95810 Presence of automatic (implantable) cardiac defibrillator: Secondary | ICD-10-CM

## 2023-01-27 DIAGNOSIS — Z8249 Family history of ischemic heart disease and other diseases of the circulatory system: Secondary | ICD-10-CM | POA: Diagnosis not present

## 2023-01-27 DIAGNOSIS — I5023 Acute on chronic systolic (congestive) heart failure: Secondary | ICD-10-CM | POA: Diagnosis not present

## 2023-01-27 DIAGNOSIS — I700 Atherosclerosis of aorta: Secondary | ICD-10-CM | POA: Diagnosis not present

## 2023-01-27 DIAGNOSIS — I428 Other cardiomyopathies: Secondary | ICD-10-CM | POA: Insufficient documentation

## 2023-01-27 DIAGNOSIS — Z6825 Body mass index (BMI) 25.0-25.9, adult: Secondary | ICD-10-CM

## 2023-01-27 DIAGNOSIS — I110 Hypertensive heart disease with heart failure: Secondary | ICD-10-CM | POA: Insufficient documentation

## 2023-01-27 DIAGNOSIS — Z7982 Long term (current) use of aspirin: Secondary | ICD-10-CM

## 2023-01-27 DIAGNOSIS — I5021 Acute systolic (congestive) heart failure: Principal | ICD-10-CM

## 2023-01-27 DIAGNOSIS — R0602 Shortness of breath: Secondary | ICD-10-CM | POA: Insufficient documentation

## 2023-01-27 DIAGNOSIS — Z794 Long term (current) use of insulin: Secondary | ICD-10-CM

## 2023-01-27 DIAGNOSIS — Z881 Allergy status to other antibiotic agents status: Secondary | ICD-10-CM | POA: Diagnosis not present

## 2023-01-27 DIAGNOSIS — Z885 Allergy status to narcotic agent status: Secondary | ICD-10-CM

## 2023-01-27 DIAGNOSIS — R7989 Other specified abnormal findings of blood chemistry: Secondary | ICD-10-CM | POA: Diagnosis not present

## 2023-01-27 DIAGNOSIS — J449 Chronic obstructive pulmonary disease, unspecified: Secondary | ICD-10-CM | POA: Insufficient documentation

## 2023-01-27 DIAGNOSIS — I5022 Chronic systolic (congestive) heart failure: Secondary | ICD-10-CM | POA: Insufficient documentation

## 2023-01-27 DIAGNOSIS — Z5986 Financial insecurity: Secondary | ICD-10-CM

## 2023-01-27 DIAGNOSIS — Z85820 Personal history of malignant melanoma of skin: Secondary | ICD-10-CM

## 2023-01-27 DIAGNOSIS — J849 Interstitial pulmonary disease, unspecified: Secondary | ICD-10-CM | POA: Diagnosis not present

## 2023-01-27 DIAGNOSIS — K5900 Constipation, unspecified: Secondary | ICD-10-CM | POA: Diagnosis not present

## 2023-01-27 DIAGNOSIS — Z515 Encounter for palliative care: Secondary | ICD-10-CM | POA: Diagnosis not present

## 2023-01-27 DIAGNOSIS — E119 Type 2 diabetes mellitus without complications: Secondary | ICD-10-CM | POA: Insufficient documentation

## 2023-01-27 DIAGNOSIS — E440 Moderate protein-calorie malnutrition: Secondary | ICD-10-CM | POA: Insufficient documentation

## 2023-01-27 DIAGNOSIS — I509 Heart failure, unspecified: Secondary | ICD-10-CM | POA: Diagnosis not present

## 2023-01-27 DIAGNOSIS — Z87891 Personal history of nicotine dependence: Secondary | ICD-10-CM | POA: Insufficient documentation

## 2023-01-27 DIAGNOSIS — Z79899 Other long term (current) drug therapy: Secondary | ICD-10-CM

## 2023-01-27 DIAGNOSIS — I513 Intracardiac thrombosis, not elsewhere classified: Secondary | ICD-10-CM | POA: Diagnosis present

## 2023-01-27 DIAGNOSIS — I2510 Atherosclerotic heart disease of native coronary artery without angina pectoris: Secondary | ICD-10-CM | POA: Diagnosis present

## 2023-01-27 DIAGNOSIS — R600 Localized edema: Secondary | ICD-10-CM | POA: Insufficient documentation

## 2023-01-27 DIAGNOSIS — I272 Pulmonary hypertension, unspecified: Secondary | ICD-10-CM | POA: Diagnosis not present

## 2023-01-27 DIAGNOSIS — E785 Hyperlipidemia, unspecified: Secondary | ICD-10-CM | POA: Diagnosis not present

## 2023-01-27 DIAGNOSIS — N179 Acute kidney failure, unspecified: Secondary | ICD-10-CM | POA: Diagnosis not present

## 2023-01-27 DIAGNOSIS — Z66 Do not resuscitate: Secondary | ICD-10-CM | POA: Diagnosis not present

## 2023-01-27 DIAGNOSIS — Z96659 Presence of unspecified artificial knee joint: Secondary | ICD-10-CM | POA: Diagnosis present

## 2023-01-27 DIAGNOSIS — Z86711 Personal history of pulmonary embolism: Secondary | ICD-10-CM

## 2023-01-27 DIAGNOSIS — N400 Enlarged prostate without lower urinary tract symptoms: Secondary | ICD-10-CM | POA: Diagnosis present

## 2023-01-27 DIAGNOSIS — Z7984 Long term (current) use of oral hypoglycemic drugs: Secondary | ICD-10-CM

## 2023-01-27 DIAGNOSIS — F419 Anxiety disorder, unspecified: Secondary | ICD-10-CM | POA: Diagnosis present

## 2023-01-27 DIAGNOSIS — Z7189 Other specified counseling: Secondary | ICD-10-CM | POA: Diagnosis not present

## 2023-01-27 DIAGNOSIS — Z7985 Long-term (current) use of injectable non-insulin antidiabetic drugs: Secondary | ICD-10-CM

## 2023-01-27 DIAGNOSIS — Z888 Allergy status to other drugs, medicaments and biological substances status: Secondary | ICD-10-CM

## 2023-01-27 DIAGNOSIS — Z905 Acquired absence of kidney: Secondary | ICD-10-CM

## 2023-01-27 LAB — CBC
HCT: 35.2 % — ABNORMAL LOW (ref 39.0–52.0)
Hemoglobin: 11.1 g/dL — ABNORMAL LOW (ref 13.0–17.0)
MCH: 30.7 pg (ref 26.0–34.0)
MCHC: 31.5 g/dL (ref 30.0–36.0)
MCV: 97.5 fL (ref 80.0–100.0)
Platelets: 297 10*3/uL (ref 150–400)
RBC: 3.61 MIL/uL — ABNORMAL LOW (ref 4.22–5.81)
RDW: 15.8 % — ABNORMAL HIGH (ref 11.5–15.5)
WBC: 9.4 10*3/uL (ref 4.0–10.5)
nRBC: 0 % (ref 0.0–0.2)

## 2023-01-27 LAB — BASIC METABOLIC PANEL
Anion gap: 17 — ABNORMAL HIGH (ref 5–15)
BUN: 23 mg/dL (ref 8–23)
CO2: 22 mmol/L (ref 22–32)
Calcium: 9 mg/dL (ref 8.9–10.3)
Chloride: 96 mmol/L — ABNORMAL LOW (ref 98–111)
Creatinine, Ser: 1.32 mg/dL — ABNORMAL HIGH (ref 0.61–1.24)
GFR, Estimated: 57 mL/min — ABNORMAL LOW (ref >60)
Glucose, Bld: 168 mg/dL — ABNORMAL HIGH (ref 70–99)
Potassium: 4.1 mmol/L (ref 3.5–5.1)
Sodium: 135 mmol/L (ref 135–145)

## 2023-01-27 LAB — BRAIN NATRIURETIC PEPTIDE: B Natriuretic Peptide: >4500 pg/mL — ABNORMAL HIGH (ref 0.0–100.0)

## 2023-01-27 LAB — TROPONIN I (HIGH SENSITIVITY): Troponin I (High Sensitivity): 90 ng/L — ABNORMAL HIGH (ref <18)

## 2023-01-27 NOTE — ED Triage Notes (Signed)
Pt here sent from cards office with plans to be admitted for iv diuretics, pt has increased his lasix at home 4 days ago with minimal results

## 2023-01-27 NOTE — Progress Notes (Signed)
ReDS Vest / Clip - 01/27/23 1527       ReDS Vest / Clip   Station Marker A    Ruler Value 29    ReDS Value Range High volume overload    ReDS Actual Value 42

## 2023-01-27 NOTE — Patient Instructions (Signed)
Medication Changes:  Please return to the Emergency Department here at [facility]   Follow-Up in: tbd   At the [facility], you and your health needs are our priority. We have a designated team specialized in the treatment of Heart Failure. This Care Team includes your primary Heart Failure Specialized Cardiologist (physician), Advanced Practice Providers (APPs- Physician Assistants and Nurse Practitioners), and Pharmacist who all work together to provide you with the care you need, when you need it.   You may see any of the following providers on your designated Care Team at your next follow up:  Dr. Arvilla Meres Dr. Marca Ancona Dr. Marcos Eke, NP Robbie Lis, [place] [facility] [place], [place] Brynda Peon, NP Karle Plumber, PharmD   Please be sure to bring in all your medications bottles to every appointment.   Need to Contact us:  If you have any questions or concerns before your next appointment please send [place] a message through [place] or call our office at [phone].    TO LEAVE A MESSAGE FOR THE NURSE SELECT OPTION 2, PLEASE LEAVE A MESSAGE INCLUDING: YOUR NAME DATE OF BIRTH CALL BACK NUMBER REASON FOR CALL**this is important as we prioritize the call backs  YOU WILL RECEIVE A CALL BACK THE SAME DAY AS LONG AS YOU CALL BEFORE 4:00 PM

## 2023-01-27 NOTE — Progress Notes (Signed)
ADVANCED HEART FAILURE CLINIC NOTE  Referring Physician: No ref. provider found  Primary Care: Phillip Laughter, MD (Inactive) Primary Cardiologist: N/A  HPI: Phillip Rich is a 72 y.o. male with T2DM, HTN, renal mass s/p nephrectomy, COPD that presented to the [facility] ER w/ acute onset SOB on 08/27 when he was diagnosed with acute PE and HFrEF w/ an LVEF of 25%-30%; during admission, coronary angiography was significant for CTO of the RCA and diffusely diseased Lcx. RHC w/ elevated pre and post capillary filling pressures. He was diuresed and discharged home and started on some GDMT. Since that time, Phillip Rich has had a CMR demonstrating persistently reduced LV function (EF 20%) and CPX with severe cardiac limitations. He is now been under evaluation for possibility of advanced therapies (LVAD, BAT wire, etc).   Interval hx:  - Since return from [place] he has felt very poorly. He is now unable to walk more than 8ft before becoming severely short of breath. In addition to this he has conversational dyspnea requiring frequent breaks during our conversation. He has been to urgent care and his PCP multiple times where lasix was increased and he was started on amoxicillin. He has had no improvement in symptoms unfortunately.   Activity level/exercise tolerance:  NYHA IIIB; concerning for low output heart failure Orthopnea:  Sleeps on 2 pillows Paroxysmal noctural dyspnea:  No Chest pain/pressure:  No Orthostatic lightheadedness:  No Palpitations:  No Lower extremity edema:  1-2+ but cool to touch Presyncope/syncope:  No Cough:  Intermittent  Past Medical History:  Diagnosis Date   Anxiety    BPH (benign prostatic hyperplasia)    Complication of anesthesia    Diabetes mellitus    DJD (degenerative joint disease)    GERD (gastroesophageal reflux disease)    HTN (hypertension)    Hyperlipidemia    ILD (interstitial lung disease) (HCC)    Left renal mass    Melanoma (HCC)     Mild sleep apnea    Sleep study pending   PONV (postoperative nausea and vomiting)     Current Outpatient Medications  Medication Sig Dispense Refill   albuterol (VENTOLIN HFA) 108 (90 Base) MCG/ACT inhaler Inhale 1-2 puffs into the lungs every 6 (six) hours as needed for wheezing or shortness of breath. 1 each 0   aspirin EC 81 MG tablet Take 81 mg by mouth daily. Swallow whole.     atorvastatin (LIPITOR) 80 MG tablet Take 1 tablet (80 mg total) by mouth daily. 90 tablet 3   carvedilol (COREG) 6.25 MG tablet Take 1 tablet (6.25 mg total) by mouth 2 (two) times daily with a meal. 180 tablet 3   Cholecalciferol (VITAMIN D PO) Take 1,000 Units by mouth every morning.     DULoxetine (CYMBALTA) 60 MG capsule Take 60 mg by mouth daily.     empagliflozin (JARDIANCE) 10 MG TABS tablet Take 1 tablet (10 mg total) by mouth daily before breakfast. 90 tablet 3   fluticasone (FLONASE) 50 MCG/ACT nasal spray Place 1 spray into both nostrils 2 (two) times a week.     furosemide (LASIX) 20 MG tablet Take 1 tablet (20 mg total) by mouth as needed for fluid or edema. Greater than 3lb in 24 hours or 5 lb in a week 30 tablet 4   insulin lispro (HUMALOG KWIKPEN) 100 UNIT/ML KwikPen Inject 12 Units into the skin 2 (two) times daily.     levocetirizine (XYZAL) 5 MG tablet Take 5 mg by mouth  daily.     Melatonin 10 MG TABS Take 10 mg by mouth at bedtime as needed (Sleep).     metFORMIN (GLUCOPHAGE) 1000 MG tablet Take 1,000 mg by mouth 2 (two) times daily.      omeprazole (PRILOSEC) 20 MG capsule Take 1 tablet by mouth every morning.      sacubitril-valsartan (ENTRESTO) 97-103 MG Take 1 tablet by mouth 2 (two) times daily. 60 tablet 5   Semaglutide,0.25 or 0.5MG /DOS, (OZEMPIC, 0.25 OR 0.5 MG/DOSE,) 2 MG/3ML SOPN Inject 0.25 mg into the skin once a week.     vitamin B-12 (CYANOCOBALAMIN) 100 MCG tablet Take 100 mcg by mouth daily.     zolpidem (AMBIEN) 10 MG tablet Take 10 mg by mouth at bedtime as needed for sleep.      No current facility-administered medications for this encounter.    Allergies  Allergen Reactions   Codeine Nausea And Vomiting   Crestor [Rosuvastatin Calcium] Other (See Comments)    Leg aches   Erythromycin Rash    All Mycin drugs      Social History   Socioeconomic History   Marital status: Single    Spouse name: Not on file   Number of children: 0   Years of education: Not on file   Highest education level: High school graduate  Occupational History   Occupation: retired  Tobacco Use   Smoking status: Former    Current packs/day: 0.00    Average packs/day: 1 pack/day for 50.0 years (50.0 ttl pk-yrs)    Types: Cigarettes    Start date: 10/17/1971    Quit date: 10/16/2021    Years since quitting: 1.2   Smokeless tobacco: Never  Vaping Use   Vaping status: Never Used  Substance and Sexual Activity   Alcohol use: Yes    Comment: rare occ.   Drug use: Yes    Types: Marijuana    Comment: 07/29/21 3 x week to help sleep   Sexual activity: Not on file  Other Topics Concern   Not on file  Social History Narrative   Lives alone.  Divorced.  Retired Engineer, technical sales.       Social Determinants of Health   Financial Resource Strain: Medium Risk (01/16/2022)   Overall Financial Resource Strain (CARDIA)    Difficulty of Paying Living Expenses: Somewhat hard  Food Insecurity: No Food Insecurity (01/16/2022)   Hunger Vital Sign    Worried About Running Out of Food in the Last Year: Never true    Ran Out of Food in the Last Year: Never true  Transportation Needs: No Transportation Needs (01/16/2022)   PRAPARE - Administrator, Civil Service (Medical): No    Lack of Transportation (Non-Medical): No  Physical Activity: Not on file  Stress: Not on file  Social Connections: Not on file  Intimate Partner Violence: Not on file      Family History  Problem Relation Age of Onset   Lung cancer Mother    Heart disease Father    Asthma Sister    Asthma Sister     Colon cancer Sister    Rheum arthritis Sister        x3    PHYSICAL EXAM: Vitals:   01/27/23 1501  BP: 124/82  Pulse: 99  SpO2: 96%   GENERAL: Well nourished, well developed, and in no apparent distress at rest.  HEENT: Negative for arcus senilis or xanthelasma. There is no scleral icterus.  The mucous membranes are pink  and moist.   NECK: Supple, No masses. Normal carotid upstrokes without bruits. No masses or thyromegaly.    CHEST: There are no chest wall deformities. There is no chest wall tenderness. Respirations are unlabored.  Lungs- decreased at bases CARDIAC:  JVP: 10 cm          Normal rate with regular rhythm. No murmurs, rubs or gallops.  Pulses are 2+ and symmetrical in upper and lower extremities. 2+ edema.  ABDOMEN: Soft, non-tender, non-distended. There are no masses or hepatomegaly. There are normal bowel sounds.  EXTREMITIES: Warm and well perfused with no cyanosis, clubbing.  LYMPHATIC: No axillary or supraclavicular lymphadenopathy.  NEUROLOGIC: Patient is oriented x3 with no focal or lateralizing neurologic deficits.  PSYCH: Patients affect is appropriate, there is no evidence of anxiety or depression.  SKIN: Warm and dry; no lesions or wounds.      DATA REVIEW  ECG: 02/28/22: NSR  ECHO: TTE 01/10/22: LVEF 15%-20%, RV function moderately reduced. Moderate MR.  TTE 11/25/22: LVEF 30%,   CMR 04/21/22: 1. Moderate LV dilatation with severe systolic dysfunction (EF 20%). There is akinesis of basal inferior wall and thinning/akinesis of mid to apical anterior wall  2.  Normal RV size with mild systolic dysfunction (EF 40%) 3. Subendocardial late gadolinium enhancement consistent with prior infarct in basal inferior, mid to apical anterior, and apical lateral walls. LGE is less than 50% transmural in basal inferior wall, suggesting viability. LGE is greater than 50% transmural in mid to apical anterior and apical lateral walls, suggesting nonviability  in this region.  4.  Moderate tricuspid regurgitation (regurgitant fraction 23%)  5. Mild mitral regurgitation (regurgitant fraction 10%)  CATH: 01/15/22   Ost LM lesion is 20% stenosed.   Prox Cx to Dist Cx lesion is 70% stenosed.   Prox LAD to Mid LAD lesion is 30% stenosed.   Mid RCA lesion is 100% stenosed.   Prox RCA lesion is 70% stenosed.   1.  Significant two-vessel coronary artery disease with chronically occluded mid right coronary artery with left to right as well as bridging collaterals.  The left circumflex is tortuous and diffusely diseased in the midsegment. 2.  Left ventricular angiography was not performed.  EF was severely reduced by echo. 3.  Right heart catheterization showed significantly elevated right and left-sided filling pressures, moderate to severe pulmonary hypertension and moderately reduced cardiac output.  08/05/22: HEMODYNAMICS: RA:                  6 mmHg (mean) RV:                  27/4-6 mmHg PA:                  28/15 mmHg (21 mean) PCWP:            14 mmHg (mean)                                      Estimated Fick CO/CI   4.5 L/min, 2.2 L/min/m2 Thermodilution CO/CI   4.5 L/min, 2.2 L/min/m2                                              TPG  7  mmHg                                              PVR                 1.5 Wood Units  PAPi                2.2    CPX: 06/20/22: Mildly submax test but based on available data there appears to be a severe HF limitations with a blunted BP response to exercise and markedly elevated VeVCO2 slope. Consideration should be given to advanced HF therapies if clinically indicated.   ASSESSMENT & PLAN:  Heart failure with reduced EF, NYHA III Etiology of GM:WNUUV, ischemic and nonischemic components; most recent RHC w/ cardiac index of 2.2L/min/m2 with elevated pre and post capillary filling pressures likely secondary to significant volume overload; I do not suspect he has a significant burden of fixed  pulmonary hypertension (PVR ~2.9 Wood units).  NYHA class / AHA Stage:IIB-III Volume status & Diuretics:  hypervolemic; taking lasix 40mg  daily. Will admit for IV diuresis; REDS 42%.  Vasodilators: continue entresto to 97/103mg BID Beta-Blocker: continue coreg 6.25mg  BID OZD:[id number] 12.5mg  daily Cardiometabolic:jardiance 10mg  daily; urinalysis negative for UTI Devices therapies & Valvulopathies: CMR w/ LVEF 20%.  Patient has met Dr. Graciela Husbands for ICD evaluation.  In the interim he has had a very concerning CPX.  Repeat right heart cath with mildly reduced cardiac index.  At this time patient not interested in pursuing advanced therapies or ICD. Repeat TTE on 11/25/22 w/ LVEF of 30%.  Advanced therapies:  Mr. Etchells had a CPX with a peak VO2 of 10.9 and severely elevated VE/VCO2 slope.  I am concerned about his overall prognosis.  He meets criteria for stage D heart failure.  Repeat right heart cath as above with mildly reduced cardiac index. At this time, Mr. Fran does not wish to proceed with LVAD, ICD or BAT wire. We have spoken at length regarding the benefits/risks of each of these therapies. He would like to continue medical management only for the time being.  01/27/23: Presenting with significant SOB, LE edema and cool extremities; I am concerned for low output heart failure given the severity of his CHF. Will plan to direct admit for IV diuresis and RHC tomorrow AM. I have asked him to come to the ER for direct admission. Plan on obtaining CMP/CBC/BNP/CXR/ECG and start IV lasix 80mg  BID. Plan for RHC in next 24-48h pending cath lab availability.   2. Pulmonary embolism  - CT from 01/05/22 w/ acute PE involving lobar and segmental branches at the RLL, RML - apixaban previously discontinued; completed close to 1 year of therapy.   3. COPD - has not required home O2; stable today; does not report history of frequent exacerbations.  - Last PFTs from 2012, will repeat with CPX.   4. Tobacco  abuse - He has now stopped smoking.   Tobe Kervin Advanced Heart Failure Mechanical Circulatory Support

## 2023-01-28 ENCOUNTER — Other Ambulatory Visit: Payer: Self-pay

## 2023-01-28 ENCOUNTER — Inpatient Hospital Stay (HOSPITAL_COMMUNITY): Payer: Medicare Other

## 2023-01-28 ENCOUNTER — Encounter (HOSPITAL_COMMUNITY)
Admission: EM | Disposition: A | Discharge status: Home / Self Care | Payer: Self-pay | Source: Home / Self Care | Attending: Internal Medicine

## 2023-01-28 DIAGNOSIS — Z881 Allergy status to other antibiotic agents status: Secondary | ICD-10-CM | POA: Diagnosis not present

## 2023-01-28 DIAGNOSIS — Z01818 Encounter for other preprocedural examination: Secondary | ICD-10-CM | POA: Diagnosis not present

## 2023-01-28 DIAGNOSIS — Z79899 Other long term (current) drug therapy: Secondary | ICD-10-CM | POA: Diagnosis not present

## 2023-01-28 DIAGNOSIS — Z66 Do not resuscitate: Secondary | ICD-10-CM | POA: Diagnosis not present

## 2023-01-28 DIAGNOSIS — I429 Cardiomyopathy, unspecified: Secondary | ICD-10-CM | POA: Diagnosis not present

## 2023-01-28 DIAGNOSIS — Z7189 Other specified counseling: Secondary | ICD-10-CM | POA: Diagnosis not present

## 2023-01-28 DIAGNOSIS — K219 Gastro-esophageal reflux disease without esophagitis: Secondary | ICD-10-CM | POA: Diagnosis present

## 2023-01-28 DIAGNOSIS — I2510 Atherosclerotic heart disease of native coronary artery without angina pectoris: Secondary | ICD-10-CM | POA: Diagnosis present

## 2023-01-28 DIAGNOSIS — I509 Heart failure, unspecified: Secondary | ICD-10-CM | POA: Diagnosis not present

## 2023-01-28 DIAGNOSIS — I5023 Acute on chronic systolic (congestive) heart failure: Secondary | ICD-10-CM | POA: Diagnosis not present

## 2023-01-28 DIAGNOSIS — Z515 Encounter for palliative care: Secondary | ICD-10-CM | POA: Diagnosis not present

## 2023-01-28 DIAGNOSIS — Z01810 Encounter for preprocedural cardiovascular examination: Secondary | ICD-10-CM | POA: Diagnosis not present

## 2023-01-28 DIAGNOSIS — E785 Hyperlipidemia, unspecified: Secondary | ICD-10-CM | POA: Diagnosis present

## 2023-01-28 DIAGNOSIS — K5900 Constipation, unspecified: Secondary | ICD-10-CM | POA: Diagnosis present

## 2023-01-28 DIAGNOSIS — R591 Generalized enlarged lymph nodes: Secondary | ICD-10-CM | POA: Diagnosis not present

## 2023-01-28 DIAGNOSIS — K5730 Diverticulosis of large intestine without perforation or abscess without bleeding: Secondary | ICD-10-CM | POA: Diagnosis not present

## 2023-01-28 DIAGNOSIS — J449 Chronic obstructive pulmonary disease, unspecified: Secondary | ICD-10-CM | POA: Diagnosis present

## 2023-01-28 DIAGNOSIS — N400 Enlarged prostate without lower urinary tract symptoms: Secondary | ICD-10-CM | POA: Diagnosis present

## 2023-01-28 DIAGNOSIS — I513 Intracardiac thrombosis, not elsewhere classified: Secondary | ICD-10-CM | POA: Diagnosis present

## 2023-01-28 DIAGNOSIS — Z85820 Personal history of malignant melanoma of skin: Secondary | ICD-10-CM | POA: Diagnosis not present

## 2023-01-28 DIAGNOSIS — E440 Moderate protein-calorie malnutrition: Secondary | ICD-10-CM | POA: Diagnosis not present

## 2023-01-28 DIAGNOSIS — I5021 Acute systolic (congestive) heart failure: Secondary | ICD-10-CM | POA: Diagnosis present

## 2023-01-28 DIAGNOSIS — Z794 Long term (current) use of insulin: Secondary | ICD-10-CM | POA: Diagnosis not present

## 2023-01-28 DIAGNOSIS — E1165 Type 2 diabetes mellitus with hyperglycemia: Secondary | ICD-10-CM | POA: Diagnosis present

## 2023-01-28 DIAGNOSIS — Z452 Encounter for adjustment and management of vascular access device: Secondary | ICD-10-CM | POA: Diagnosis not present

## 2023-01-28 DIAGNOSIS — J849 Interstitial pulmonary disease, unspecified: Secondary | ICD-10-CM | POA: Diagnosis not present

## 2023-01-28 DIAGNOSIS — R7989 Other specified abnormal findings of blood chemistry: Secondary | ICD-10-CM | POA: Diagnosis present

## 2023-01-28 DIAGNOSIS — G4700 Insomnia, unspecified: Secondary | ICD-10-CM | POA: Diagnosis present

## 2023-01-28 DIAGNOSIS — N281 Cyst of kidney, acquired: Secondary | ICD-10-CM | POA: Diagnosis not present

## 2023-01-28 DIAGNOSIS — Z8249 Family history of ischemic heart disease and other diseases of the circulatory system: Secondary | ICD-10-CM | POA: Diagnosis not present

## 2023-01-28 DIAGNOSIS — I272 Pulmonary hypertension, unspecified: Secondary | ICD-10-CM | POA: Diagnosis present

## 2023-01-28 DIAGNOSIS — J8410 Pulmonary fibrosis, unspecified: Secondary | ICD-10-CM | POA: Diagnosis not present

## 2023-01-28 DIAGNOSIS — F419 Anxiety disorder, unspecified: Secondary | ICD-10-CM | POA: Diagnosis present

## 2023-01-28 DIAGNOSIS — I255 Ischemic cardiomyopathy: Secondary | ICD-10-CM | POA: Diagnosis not present

## 2023-01-28 DIAGNOSIS — I110 Hypertensive heart disease with heart failure: Secondary | ICD-10-CM | POA: Diagnosis present

## 2023-01-28 DIAGNOSIS — N179 Acute kidney failure, unspecified: Secondary | ICD-10-CM | POA: Diagnosis present

## 2023-01-28 DIAGNOSIS — J84112 Idiopathic pulmonary fibrosis: Secondary | ICD-10-CM | POA: Diagnosis present

## 2023-01-28 HISTORY — PX: RIGHT HEART CATH: CATH118263

## 2023-01-28 LAB — MAGNESIUM: Magnesium: 1.7 mg/dL (ref 1.7–2.4)

## 2023-01-28 LAB — GLUCOSE, CAPILLARY
Glucose-Capillary: 140 mg/dL — ABNORMAL HIGH (ref 70–99)
Glucose-Capillary: 158 mg/dL — ABNORMAL HIGH (ref 70–99)
Glucose-Capillary: 129 mg/dL — ABNORMAL HIGH (ref 70–99)

## 2023-01-28 LAB — I-STAT CG4 LACTIC ACID, ED: Lactic Acid, Venous: 2.1 mmol/L (ref 0.5–1.9)

## 2023-01-28 LAB — POCT I-STAT EG7
Acid-Base Excess: 1 mmol/L (ref 0.0–2.0)
Acid-Base Excess: 1 mmol/L (ref 0.0–2.0)
Bicarbonate: 26.6 mmol/L (ref 20.0–28.0)
Bicarbonate: 26.6 mmol/L (ref 20.0–28.0)
Calcium, Ion: 1.16 mmol/L (ref 1.15–1.40)
Calcium, Ion: 1.17 mmol/L (ref 1.15–1.40)
HCT: 32 % — ABNORMAL LOW (ref 39.0–52.0)
HCT: 32 % — ABNORMAL LOW (ref 39.0–52.0)
Hemoglobin: 10.9 g/dL — ABNORMAL LOW (ref 13.0–17.0)
Hemoglobin: 10.9 g/dL — ABNORMAL LOW (ref 13.0–17.0)
O2 Saturation: 42 %
O2 Saturation: 41 %
Potassium: 3.9 mmol/L (ref 3.5–5.1)
Potassium: 3.9 mmol/L (ref 3.5–5.1)
Sodium: 137 mmol/L (ref 135–145)
Sodium: 137 mmol/L (ref 135–145)
TCO2: 28 mmol/L (ref 22–32)
TCO2: 28 mmol/L (ref 22–32)
pCO2, Ven: 44.5 mmHg (ref 44–60)
pCO2, Ven: 44.8 mmHg (ref 44–60)
pH, Ven: 7.385 (ref 7.25–7.43)
pH, Ven: 7.383 (ref 7.25–7.43)
pO2, Ven: 24 mmHg — CL (ref 32–45)
pO2, Ven: 24 mmHg — CL (ref 32–45)

## 2023-01-28 LAB — URINALYSIS, ROUTINE W REFLEX MICROSCOPIC
Bacteria, UA: NONE SEEN
Bilirubin Urine: NEGATIVE
Glucose, UA: 50 mg/dL — AB
Ketones, ur: NEGATIVE mg/dL
Leukocytes,Ua: NEGATIVE
Nitrite: NEGATIVE
Protein, ur: 100 mg/dL — AB
Specific Gravity, Urine: 1.011 (ref 1.005–1.030)
pH: 5 (ref 5.0–8.0)

## 2023-01-28 LAB — RAPID URINE DRUG SCREEN, HOSP PERFORMED
Amphetamines: NOT DETECTED
Barbiturates: NOT DETECTED
Benzodiazepines: NOT DETECTED
Cocaine: NOT DETECTED
Opiates: NOT DETECTED
Tetrahydrocannabinol: POSITIVE — AB

## 2023-01-28 LAB — COMPREHENSIVE METABOLIC PANEL WITH GFR
ALT: 126 U/L — ABNORMAL HIGH (ref 0–44)
AST: 98 U/L — ABNORMAL HIGH (ref 15–41)
Albumin: 2.7 g/dL — ABNORMAL LOW (ref 3.5–5.0)
Alkaline Phosphatase: 191 U/L — ABNORMAL HIGH (ref 38–126)
Anion gap: 11 (ref 5–15)
BUN: 23 mg/dL (ref 8–23)
CO2: 21 mmol/L — ABNORMAL LOW (ref 22–32)
Calcium: 8.6 mg/dL — ABNORMAL LOW (ref 8.9–10.3)
Chloride: 101 mmol/L (ref 98–111)
Creatinine, Ser: 1.22 mg/dL (ref 0.61–1.24)
GFR, Estimated: >60 mL/min (ref >60)
Glucose, Bld: 145 mg/dL — ABNORMAL HIGH (ref 70–99)
Potassium: 3.7 mmol/L (ref 3.5–5.1)
Sodium: 133 mmol/L — ABNORMAL LOW (ref 135–145)
Total Bilirubin: 1.3 mg/dL — ABNORMAL HIGH (ref 0.3–1.2)
Total Protein: 6.9 g/dL (ref 6.5–8.1)

## 2023-01-28 LAB — CBG MONITORING, ED: Glucose-Capillary: 136 mg/dL — ABNORMAL HIGH (ref 70–99)

## 2023-01-28 SURGERY — RIGHT HEART CATH
Anesthesia: LOCAL

## 2023-01-28 MED ORDER — INSULIN ASPART 100 UNIT/ML IJ SOLN
0.0000 [IU] | Freq: Three times a day (TID) | INTRAMUSCULAR | Status: DC
  Administered 2023-01-28: 2 [IU] via SUBCUTANEOUS
  Administered 2023-01-28 – 2023-01-29 (×4): 1 [IU] via SUBCUTANEOUS
  Administered 2023-01-30: 2 [IU] via SUBCUTANEOUS
  Administered 2023-01-30: 1 [IU] via SUBCUTANEOUS
  Administered 2023-01-30 – 2023-01-31 (×3): 2 [IU] via SUBCUTANEOUS
  Administered 2023-02-01 – 2023-02-02 (×4): 1 [IU] via SUBCUTANEOUS
  Administered 2023-02-02: 2 [IU] via SUBCUTANEOUS
  Administered 2023-02-02 – 2023-02-03 (×2): 1 [IU] via SUBCUTANEOUS
  Administered 2023-02-03 – 2023-02-04 (×3): 2 [IU] via SUBCUTANEOUS
  Administered 2023-02-04: 1 [IU] via SUBCUTANEOUS

## 2023-01-28 MED ORDER — ACETAMINOPHEN 325 MG PO TABS
650.0000 mg | ORAL_TABLET | ORAL | Status: DC | PRN
  Administered 2023-01-29 – 2023-02-02 (×5): 650 mg via ORAL
  Filled 2023-01-28 (×5): qty 2

## 2023-01-28 MED ORDER — CARVEDILOL 3.125 MG PO TABS
6.2500 mg | ORAL_TABLET | Freq: Two times a day (BID) | ORAL | Status: DC
  Administered 2023-01-28: 6.25 mg via ORAL
  Filled 2023-01-28: qty 2

## 2023-01-28 MED ORDER — FENTANYL CITRATE (PF) 100 MCG/2ML IJ SOLN
INTRAMUSCULAR | Status: AC
  Filled 2023-01-28: qty 2

## 2023-01-28 MED ORDER — DIGOXIN 125 MCG PO TABS
0.1250 mg | ORAL_TABLET | Freq: Every day | ORAL | Status: DC
  Administered 2023-01-29 – 2023-02-04 (×7): 0.125 mg via ORAL
  Filled 2023-01-28 (×7): qty 1

## 2023-01-28 MED ORDER — MAGNESIUM SULFATE 2 GM/50ML IV SOLN
2.0000 g | Freq: Once | INTRAVENOUS | Status: AC
  Administered 2023-01-28: 2 g via INTRAVENOUS
  Filled 2023-01-28: qty 50

## 2023-01-28 MED ORDER — LIDOCAINE HCL (PF) 1 % IJ SOLN
INTRAMUSCULAR | Status: DC | PRN
  Administered 2023-01-28: 2 mL

## 2023-01-28 MED ORDER — ENOXAPARIN SODIUM 40 MG/0.4ML IJ SOSY
40.0000 mg | PREFILLED_SYRINGE | INTRAMUSCULAR | Status: DC
  Administered 2023-01-28 – 2023-01-29 (×2): 40 mg via SUBCUTANEOUS
  Filled 2023-01-28 (×2): qty 0.4

## 2023-01-28 MED ORDER — SODIUM CHLORIDE 0.9% FLUSH: 3.0000 mL | INTRAVENOUS | Status: DC | PRN

## 2023-01-28 MED ORDER — MIDAZOLAM HCL 2 MG/2ML IJ SOLN
INTRAMUSCULAR | Status: AC
  Filled 2023-01-28: qty 2

## 2023-01-28 MED ORDER — ONDANSETRON HCL 4 MG/2ML IJ SOLN
4.0000 mg | Freq: Four times a day (QID) | INTRAMUSCULAR | Status: DC | PRN
  Administered 2023-01-28: 4 mg via INTRAVENOUS
  Filled 2023-01-28: qty 2

## 2023-01-28 MED ORDER — SODIUM CHLORIDE 0.9 % IV SOLN: 250.0000 mL | INTRAVENOUS | Status: DC | PRN

## 2023-01-28 MED ORDER — PANTOPRAZOLE SODIUM 40 MG PO TBEC
40.0000 mg | DELAYED_RELEASE_TABLET | Freq: Every day | ORAL | Status: DC
  Administered 2023-01-28 – 2023-02-04 (×8): 40 mg via ORAL
  Filled 2023-01-28 (×8): qty 1

## 2023-01-28 MED ORDER — SODIUM CHLORIDE 0.9% FLUSH
3.0000 mL | Freq: Two times a day (BID) | INTRAVENOUS | Status: DC
  Administered 2023-01-29 – 2023-02-03 (×9): 3 mL via INTRAVENOUS

## 2023-01-28 MED ORDER — DULOXETINE HCL 60 MG PO CPEP
60.0000 mg | ORAL_CAPSULE | Freq: Every day | ORAL | Status: DC
  Administered 2023-01-28 – 2023-02-04 (×8): 60 mg via ORAL
  Filled 2023-01-28 (×8): qty 1

## 2023-01-28 MED ORDER — LORATADINE 10 MG PO TABS
10.0000 mg | ORAL_TABLET | Freq: Every day | ORAL | Status: DC
  Administered 2023-01-28 – 2023-02-04 (×8): 10 mg via ORAL
  Filled 2023-01-28 (×8): qty 1

## 2023-01-28 MED ORDER — ASPIRIN 81 MG PO CHEW
81.0000 mg | CHEWABLE_TABLET | Freq: Every day | ORAL | Status: DC
  Administered 2023-01-28 – 2023-02-04 (×8): 81 mg via ORAL
  Filled 2023-01-28 (×7): qty 1

## 2023-01-28 MED ORDER — CARVEDILOL 3.125 MG PO TABS
3.1250 mg | ORAL_TABLET | Freq: Two times a day (BID) | ORAL | Status: DC
  Administered 2023-01-28 – 2023-01-29 (×2): 3.125 mg via ORAL
  Filled 2023-01-28 (×2): qty 1

## 2023-01-28 MED ORDER — ATORVASTATIN CALCIUM 80 MG PO TABS
80.0000 mg | ORAL_TABLET | Freq: Every day | ORAL | Status: DC
  Administered 2023-01-28 – 2023-02-04 (×8): 80 mg via ORAL
  Filled 2023-01-28 (×8): qty 1

## 2023-01-28 MED ORDER — MILRINONE LACTATE IN DEXTROSE 20-5 MG/100ML-% IV SOLN
0.3750 ug/kg/min | INTRAVENOUS | Status: DC
  Administered 2023-01-28 – 2023-01-29 (×3): 0.25 ug/kg/min via INTRAVENOUS
  Administered 2023-01-30 – 2023-02-04 (×12): 0.375 ug/kg/min via INTRAVENOUS
  Filled 2023-01-28 (×15): qty 100

## 2023-01-28 MED ORDER — POTASSIUM CHLORIDE 20 MEQ PO PACK
40.0000 meq | PACK | Freq: Once | ORAL | Status: AC
  Administered 2023-01-28: 40 meq via ORAL
  Filled 2023-01-28: qty 2

## 2023-01-28 MED ORDER — FUROSEMIDE 10 MG/ML IJ SOLN
12.0000 mg/h | INTRAVENOUS | Status: DC
  Administered 2023-01-28 – 2023-01-29 (×3): 12 mg/h via INTRAVENOUS
  Filled 2023-01-28 (×4): qty 20

## 2023-01-28 MED ORDER — VITAMIN D 25 MCG (1000 UNIT) PO TABS
1000.0000 [IU] | ORAL_TABLET | Freq: Every morning | ORAL | Status: DC
  Administered 2023-01-29 – 2023-02-04 (×7): 1000 [IU] via ORAL
  Filled 2023-01-28 (×7): qty 1

## 2023-01-28 MED ORDER — SACUBITRIL-VALSARTAN 97-103 MG PO TABS
1.0000 | ORAL_TABLET | Freq: Two times a day (BID) | ORAL | Status: DC
  Administered 2023-01-28 – 2023-01-29 (×4): 1 via ORAL
  Filled 2023-01-28 (×6): qty 1

## 2023-01-28 MED ORDER — FUROSEMIDE 10 MG/ML IJ SOLN
80.0000 mg | Freq: Two times a day (BID) | INTRAMUSCULAR | Status: DC
  Administered 2023-01-28: 80 mg via INTRAVENOUS
  Filled 2023-01-28: qty 8

## 2023-01-28 MED ORDER — MELATONIN 3 MG PO TABS
3.0000 mg | ORAL_TABLET | Freq: Every evening | ORAL | Status: DC | PRN
  Administered 2023-01-29 – 2023-01-31 (×3): 3 mg via ORAL
  Filled 2023-01-28 (×3): qty 1

## 2023-01-28 MED ORDER — HEPARIN (PORCINE) IN NACL 1000-0.9 UT/500ML-% IV SOLN
INTRAVENOUS | Status: DC | PRN
  Administered 2023-01-28: 500 mL

## 2023-01-28 MED ORDER — ASPIRIN 81 MG PO CHEW
CHEWABLE_TABLET | ORAL | Status: AC
  Administered 2023-01-28: 81 mg via ORAL
  Filled 2023-01-28: qty 1

## 2023-01-28 MED ORDER — FLUTICASONE PROPIONATE 50 MCG/ACT NA SUSP
1.0000 | NASAL | Status: DC
  Administered 2023-01-29 – 2023-02-02 (×2): 1 via NASAL
  Filled 2023-01-28 (×2): qty 16

## 2023-01-28 MED ORDER — ASPIRIN 81 MG PO CHEW: 81.0000 mg | CHEWABLE_TABLET | ORAL | Status: AC

## 2023-01-28 MED ORDER — SODIUM CHLORIDE 0.9 % IV SOLN: INTRAVENOUS | Status: DC

## 2023-01-28 MED ORDER — FUROSEMIDE 10 MG/ML IJ SOLN
80.0000 mg | Freq: Once | INTRAMUSCULAR | Status: AC
  Administered 2023-01-28: 80 mg via INTRAVENOUS
  Filled 2023-01-28 (×2): qty 8

## 2023-01-28 SURGICAL SUPPLY — 7 items
CATH BALLN WEDGE 5F 110CM (CATHETERS)
CATH SWAN GANZ 7F STRAIGHT (CATHETERS) ×1
GLIDESHEATH SLENDER 7FR .021G (SHEATH) ×1
GUIDEWIRE .025 260CM (WIRE) ×1
PACK CARDIAC CATHETERIZATION (CUSTOM PROCEDURE TRAY) ×1
SHEATH GLIDE SLENDER 4/5FR (SHEATH) ×1
TRANSDUCER W/STOPCOCK (MISCELLANEOUS) ×1

## 2023-01-28 NOTE — H&P (Signed)
Cardiology Admission History and Physical   Patient ID: Phillip Rich MRN: [id number]; DOB: May 10, 1951   Admission date: 01/27/2023  PCP:  Merlene Laughter, MD (Inactive)   [facility] Providers Cardiologist:  Rollene Rotunda, MD        Chief Complaint:  shortness of breath  Patient Profile:   Phillip Rich is a 72 y.o. male with CAD, chronic systolic HF (likely mixed ischemic and non-ischemic), T2DM, HTN, renal mass s/p nephrectomy, COPD and history of PE who is being seen 01/28/2023 for the evaluation of shortness of breath.  History of Present Illness:   Phillip Rich has a history of HFrEF and coronary artery disease.  He previously required admission and required aggressive diuresis after RHC revealed elevated pre and postcapillary filling pressures with low CI.  He felt better after diuresis and GDMT was subsequently initiated.  Since that time, he has undergone CMR with a persistently reduced LVEF of 20% as well as a CPX with severe cardiac limitations.  Advanced therapies have been considered, though Phillip Rich has wished to proceed with only medical therapy as of late.  He states that over the last month or so he has started to feel poorly.  He has had about 1 month of weight gain as well as progressive dyspnea, mainly with exertion.  He has also had poor appetite for a few weeks with abdominal fullness.  Denies overt conversational dyspnea though will experience fairly significant dyspnea when walking only short distances (to his mailbox and back, for example).  He usually takes 20 mg of Lasix per day but over the last 3-4 days, he has been taking 40 mg daily the improvement of his symptoms.  Just over the last few days, he has also noticed progressive lower extremity swelling.  He is now orthopneic with minimal decline in head position.  Denies any chest discomfort symptoms or palpitations/heart racing.  Has been compliant on GDMT though states that he stopped his  aspirin recently due to bruising on his hands and forearms.  No longer taking anticoagulation with his history of PE.  Past Medical History:  Diagnosis Date   Anxiety    BPH (benign prostatic hyperplasia)    Complication of anesthesia    Diabetes mellitus    DJD (degenerative joint disease)    GERD (gastroesophageal reflux disease)    HTN (hypertension)    Hyperlipidemia    ILD (interstitial lung disease) (HCC)    Left renal mass    Melanoma (HCC)    Mild sleep apnea    Sleep study pending   PONV (postoperative nausea and vomiting)     Past Surgical History:  Procedure Laterality Date   COLONOSCOPY WITH PROPOFOL N/A 04/03/2014   Procedure: COLONOSCOPY WITH PROPOFOL;  Surgeon: Charolett Bumpers, MD;  Location: [facility];  Service: Endoscopy;  Laterality: N/A;   KNEE ARTHROSCOPY Right    x2 right, x5 left   LASIK     MELANOMA EXCISION Left    NEPHRECTOMY Left 2022   partial   PROSTATE ABLATION     RIGHT HEART CATH N/A 08/05/2022   Procedure: RIGHT HEART CATH;  Surgeon: Dorthula Nettles, DO;  Location: [facility];  Service: Cardiovascular;  Laterality: N/A;   RIGHT/LEFT HEART CATH AND CORONARY ANGIOGRAPHY N/A 01/15/2022   Procedure: RIGHT/LEFT HEART CATH AND CORONARY ANGIOGRAPHY;  Surgeon: Iran Ouch, MD;  Location: [facility];  Service: Cardiovascular;  Laterality: N/A;   TOTAL KNEE ARTHROPLASTY  left   VASECTOMY       Medications Prior to Admission: Prior to Admission medications   Medication Sig Start Date End Date Taking? Authorizing Provider  albuterol (VENTOLIN HFA) 108 (90 Base) MCG/ACT inhaler Inhale 1-2 puffs into the lungs every 6 (six) hours as needed for wheezing or shortness of breath. 01/05/22  Yes Elayne Snare K, DO  aspirin EC 81 MG tablet Take 81 mg by mouth daily. Swallow whole.   Yes [provider]  carvedilol (COREG) 6.25 MG tablet Take 1 tablet (6.25 mg total) by mouth 2 (two) times daily with a meal. 09/09/22  Yes  Sabharwal, [name], DO  Cholecalciferol (VITAMIN D PO) Take 1,000 Units by mouth every morning.   Yes [provider]  DULoxetine (CYMBALTA) 60 MG capsule Take 60 mg by mouth daily. 05/28/21  Yes [provider]  empagliflozin (JARDIANCE) 10 MG TABS tablet Take 1 tablet (10 mg total) by mouth daily before breakfast. 09/25/22  Yes Sabharwal, [name], DO  fluticasone (FLONASE) 50 MCG/ACT nasal spray Place 1 spray into both nostrils 2 (two) times a week.   Yes [provider]  furosemide (LASIX) 20 MG tablet Take 1 tablet (20 mg total) by mouth as needed for fluid or edema. Greater than 3lb in 24 hours or 5 lb in a week 09/09/22  Yes Sabharwal, [name], DO  levocetirizine (XYZAL) 5 MG tablet Take 5 mg by mouth daily.   Yes [provider]  Melatonin 10 MG TABS Take 10 mg by mouth at bedtime as needed (Sleep).   Yes [provider]  metFORMIN (GLUCOPHAGE) 1000 MG tablet Take 1,000 mg by mouth 2 (two) times daily.  03/06/10  Yes [provider]  omeprazole (PRILOSEC) 20 MG capsule Take 1 tablet by mouth every morning.  08/06/10  Yes [provider]  sacubitril-valsartan (ENTRESTO) 97-103 MG Take 1 tablet by mouth 2 (two) times daily. 11/25/22  Yes Sabharwal, [name], DO  Semaglutide,0.25 or 0.5MG /DOS, (OZEMPIC, 0.25 OR 0.5 MG/DOSE,) 2 MG/3ML SOPN Inject 0.25 mg into the skin once a week.   Yes [provider]  zolpidem (AMBIEN) 10 MG tablet Take 10 mg by mouth at bedtime as needed for sleep.   Yes [provider]  atorvastatin (LIPITOR) 80 MG tablet Take 1 tablet (80 mg total) by mouth daily. 09/09/22 01/27/23  Sabharwal, [name], DO  insulin lispro (HUMALOG KWIKPEN) 100 UNIT/ML KwikPen Inject 12 Units into the skin 2 (two) times daily.    [provider]  vitamin B-12 (CYANOCOBALAMIN) 100 MCG tablet Take 100 mcg by mouth daily.    [provider]     Allergies:    Allergies  Allergen Reactions   Codeine Nausea And  Vomiting   Crestor [Rosuvastatin Calcium] Other (See Comments)    Leg aches   Erythromycin Rash    All Mycin drugs    Social History:   Social History   Socioeconomic History   Marital status: Single    Spouse name: Not on file   Number of children: 0   Years of education: Not on file   Highest education level: High school graduate  Occupational History   Occupation: retired  Tobacco Use   Smoking status: Former    Current packs/day: 0.00    Average packs/day: 1 pack/day for 50.0 years (50.0 ttl pk-yrs)    Types: Cigarettes    Start date: 10/17/1971    Quit date: 10/16/2021    Years since quitting: 1.2   Smokeless tobacco:  Never  Vaping Use   Vaping status: Never Used  Substance and Sexual Activity   Alcohol use: Yes    Comment: rare occ.   Drug use: Yes    Types: Marijuana    Comment: 07/29/21 3 x week to help sleep   Sexual activity: Not on file  Other Topics Concern   Not on file  Social History Narrative   Lives alone.  Divorced.  Retired Engineer, technical sales.       Social Determinants of Health   Financial Resource Strain: Medium Risk (01/16/2022)   Overall Financial Resource Strain (CARDIA)    Difficulty of Paying Living Expenses: Somewhat hard  Food Insecurity: No Food Insecurity (01/16/2022)   Hunger Vital Sign    Worried About Running Out of Food in the Last Year: Never true    Ran Out of Food in the Last Year: Never true  Transportation Needs: No Transportation Needs (01/16/2022)   PRAPARE - Administrator, Civil Service (Medical): No    Lack of Transportation (Non-Medical): No  Physical Activity: Not on file  Stress: Not on file  Social Connections: Not on file  Intimate Partner Violence: Not on file    Family History:   The patient's family history includes Asthma in his sister and sister; Colon cancer in his sister; Heart disease in his father; Lung cancer in his mother; Rheum arthritis in his sister.    ROS:  Please see the history of  present illness.  All other ROS reviewed and negative.     Physical Exam/Data:   Vitals:   01/27/23 1805 01/27/23 2218 01/27/23 2338  BP: 113/73 116/85 118/79  Pulse: (!) 102 (!) 113 (!) 104  Resp: 20 (!) 24 16  Temp: (!) 97.5 F (36.4 C) 97.6 F (36.4 C) 97.7 F (36.5 C)  SpO2: 99% 100% 100%   No intake or output data in the 24 hours ending 01/28/23 0104    01/27/2023    3:01 PM 11/25/2022    2:27 PM 09/09/2022    2:12 PM  Last 3 Weights  Weight (lbs) 190 lb 9.6 oz 189 lb 3.2 oz 191 lb 12.8 oz  Weight (kg) 86.456 kg 85.821 kg 87 kg     There is no height or weight on file to calculate BMI.  General:  Well nourished, well developed, in no acute distress HEENT: normal Neck: JVD 4cm above clavicle when upright Vascular: No carotid bruits; Distal pulses 2+ bilaterally   Cardiac:  normal S1, S2; RRR; no murmur  Lungs: Mild conversational dyspnea, crackles at the bases Abd: soft, nontender, no hepatomegaly  Ext: Plus edema bilaterally to the knees Musculoskeletal:  No deformities, BUE and BLE strength normal and equal Skin: warm and dry  Neuro:  CNs 2-12 intact, no focal abnormalities noted Psych:  Normal affect    EKG: Sinus tachycardia  Relevant CV Studies: ECHO: TTE 01/10/22: LVEF 15%-20%, RV function moderately reduced. Moderate MR.  TTE 11/25/22: LVEF 30%   CMR 04/21/22: 1. Moderate LV dilatation with severe systolic dysfunction (EF 20%). There is akinesis of basal inferior wall and thinning/akinesis of mid to apical anterior wall  2.  Normal RV size with mild systolic dysfunction (EF 40%) 3. Subendocardial late gadolinium enhancement consistent with prior infarct in basal inferior, mid to apical anterior, and apical lateral walls. LGE is less than 50% transmural in basal inferior wall, suggesting viability. LGE is greater than 50% transmural in mid to apical anterior and apical lateral walls, suggesting  nonviability in this region.  4.  Moderate tricuspid  regurgitation (regurgitant fraction 23%)  5. Mild mitral regurgitation (regurgitant fraction 10%)   CATH: 01/15/22   Ost LM lesion is 20% stenosed.   Prox Cx to Dist Cx lesion is 70% stenosed.   Prox LAD to Mid LAD lesion is 30% stenosed.   Mid RCA lesion is 100% stenosed.   Prox RCA lesion is 70% stenosed.   1.  Significant two-vessel coronary artery disease with chronically occluded mid right coronary artery with left to right as well as bridging collaterals.  The left circumflex is tortuous and diffusely diseased in the midsegment. 2.  Left ventricular angiography was not performed.  EF was severely reduced by echo. 3.  Right heart catheterization showed significantly elevated right and left-sided filling pressures, moderate to severe pulmonary hypertension and moderately reduced cardiac output.   08/05/22: HEMODYNAMICS: RA:                  6 mmHg (mean) RV:                  27/4-6 mmHg PA:                  28/15 mmHg (21 mean) PCWP:            14 mmHg (mean)                                      Estimated Fick CO/CI   4.5 L/min, 2.2 L/min/m2 Thermodilution CO/CI   4.5 L/min, 2.2 L/min/m2                                              TPG                 7  mmHg                                              PVR                 1.5 Wood Units  PAPi                2.2     CPX: 06/20/22: Mildly submax test but based on available data there appears to be a severe HF limitations with a blunted BP response to exercise and markedly elevated VeVCO2 slope. Consideration should be given to advanced HF therapies if clinically indicated.   Laboratory Data:  High Sensitivity Troponin:   Recent Labs  Lab 01/27/23 1835  TROPONINIHS 90*      Chemistry Recent Labs  Lab 01/27/23 1835  NA 135  K 4.1  CL 96*  CO2 22  GLUCOSE 168*  BUN 23  CREATININE 1.32*  CALCIUM 9.0  GFRNONAA 57*  ANIONGAP 17*    No results for input(s): "PROT", "ALBUMIN", "AST", "ALT", "ALKPHOS", "BILITOT" in the  last 168 hours. Lipids No results for input(s): "CHOL", "TRIG", "HDL", "LABVLDL", "LDLCALC", "CHOLHDL" in the last 168 hours. Hematology Recent Labs  Lab 01/27/23 1835  WBC 9.4  RBC 3.61*  HGB 11.1*  HCT 35.2*  MCV 97.5  MCH 30.7  MCHC 31.5  RDW 15.8*  PLT 297   Thyroid No results for input(s): "TSH", "FREET4" in the last 168 hours. BNP Recent Labs  Lab 01/27/23 1835  BNP >4,500.0*    DDimer No results for input(s): "DDIMER" in the last 168 hours.   Radiology/Studies:  DG Chest 2 View  Result Date: 01/27/2023 CLINICAL DATA:  Shortness of breath. The patient increases home Lasix dosage for the past 4 days with minimal results. EXAM: CHEST - 2 VIEW COMPARISON:  01/20/2023 01/09/2022.  Chest CT dated 01/05/2022 FINDINGS: Normal sized heart. Tortuous and partially calcified thoracic aorta. The hemidiaphragms are mildly flattened with diffuse peribronchial thickening. Stable diffuse prominence of the interstitial markings, remaining most pronounced at the lung bases. No Kerley lines or pleural fluid seen. Thoracic spine degenerative changes. IMPRESSION: 1. COPD with stable chronic interstitial lung disease. 2. No acute abnormality. 3. Aortic atherosclerosis. Electronically Signed   By: Beckie Salts M.D.   On: 01/27/2023 19:55      Assessment and Plan:   Acute on chronic systolic heart failure Mixed ischemic/nonischemic cardiomyopathy Presenting with symptoms of volume overload and NYHA functional class III symptoms.  Has developed progressive lower extremity swelling, dyspnea on exertion/conversation, weight gain, poor appetite and abdominal bloating all consistent with volume overload.  His extremities are cool concerning for low output.  BNP is significantly elevated.  Does not have an oxygen requirement.  He will require IV diuresis as he is likely at least 10 pounds above his dry weight.  Will also plan for a right heart cath to reevaluate his hemodynamic status. - IV Lasix 80 mg  twice daily - strict ins and out - daily standing weights - HFP - N.p.o. for possible RHC - Continue GDMT with Entresto 97-103 mg twice daily, Coreg 6.25 mg twice daily - Hold empagliflozin with AKI, though likely can restart soon - Not currently on spironolactone  Acute kidney injury Suspect related to intravascular volume overload - IV diuresis as above  Coronary artery disease States that he recently stopped his statin due to superficial bruising - will need further discussions regarding restarting aspirin - continue statin  Hypertension - Continue Entresto and Coreg as above  Type 2 diabetes mellitus Was previously prescribed twice daily Humalog though states he has not been taking it recently - Hold home metformin and Ozempic while admitted  Insomnia States he is not taking Ambien recently - Continue home melatonin  Seasonal allergies - Claritin to replace Xyzal  Risk Assessment/Risk Scores:       New York Heart Association (NYHA) Functional Class NYHA Class III    Code Status: Full Code  Severity of Illness: The appropriate patient status for this patient is INPATIENT. Inpatient status is judged to be reasonable and necessary in order to provide the required intensity of service to ensure the patient's safety. The patient's presenting symptoms, physical exam findings, and initial radiographic and laboratory data in the context of their chronic comorbidities is felt to place them at high risk for further clinical deterioration. Furthermore, it is not anticipated that the patient will be medically stable for discharge from the hospital within 2 midnights of admission.   * I certify that at the point of admission it is my clinical judgment that the patient will require inpatient hospital care spanning beyond 2 midnights from the point of admission due to high intensity of service, high risk for further deterioration and high frequency of surveillance required.*    For questions or updates, please contact [facility]  Please consult [email or web address] for contact info under     Signed, Aundra Dubin, MD  01/28/2023 1:04 AM

## 2023-01-28 NOTE — Interval H&P Note (Signed)
History and Physical Interval Note:  01/28/2023 12:46 PM  Phillip Rich  has presented today for surgery, with the diagnosis of chf.  The various methods of treatment have been discussed with the patient and family. After consideration of risks, benefits and other options for treatment, the patient has consented to  Procedure(s): RIGHT HEART CATH (N/A) as a surgical intervention.  The patient's history has been reviewed, patient examined, no change in status, stable for surgery.  I have reviewed the patient's chart and labs.  Questions were answered to the patient's satisfaction.     Mcihael Hinderman [employer]

## 2023-01-28 NOTE — H&P (View-Only) (Signed)
Admitted with A./C HFrEF  Plan for RHC today to further assess hemodynamics. Orders placed.  The patient understands that risks included but are not limited to stroke (1 in 1000), death (1 in 1000), kidney failure [usually temporary] (1 in 500), bleeding (1 in 200), allergic reaction [possibly serious] (1 in 200).  The patient understands and agrees to proceed.  Zaylyn Bergdoll NP-C  10:20 AM

## 2023-01-28 NOTE — Progress Notes (Signed)
Primary RN aware PICC will not be placed tonight.

## 2023-01-28 NOTE — Progress Notes (Signed)
[facility] EDUCATION NOTE:                VAD evaluation consent reviewed and signed by Phillip Rich.  Initial VAD teaching completed with pt.   VAD educational packet including "Understanding Your Options with Advanced Heart Failure", "[place] Patient Agreement for VAD Evaluation and Potential Implantation" consent, and [employer] "Heartmate 3 Left Ventricular Device (LVAD) Patient Guide", "[place] HM III Patient Education", "[place] Mechanical Circulatory Support Program", and "Decision Aids for Left Ventricular Assist Device" reviewed in detail and left at bedside for continued reference.   All questions answered regarding VAD implant, hospital stay, and what to expect when discharged home living with a heart pump.   Explained need for 24/7 care when pt is discharged home due to sternal precautions, adaptation to living on support, emotional support, consistent and meticulous exit site care and management, medication adherence and high volume of follow up visits with the VAD Clinic after discharge; pt verbalized understanding of above. At this time pt states he will need to speak with his friends and family regarding caregiver need in order to determine who may be able to assist him with this role.   Explained that LVAD can be implanted for two indications in the setting of advanced left ventricular heart failure treatment:  Bridge to transplant - used for patients who cannot safely wait for heart transplant without this device.  Or    Destination therapy - used for patients until end of life or recovery of heart function.  Patient and caregiver(s) acknowledge that the indication at this point in time for LVAD therapy would be for destination therapy due to age, recent smoking.   Provided brief equipment overview and demonstration with HeartMate III training loop including discussion on the following:   a) mobile power unit b) system controller   c) universal Magazine features editor   d) battery  clips   e) Batteries   f)  Perc lock   g) Percutaneous lead   Demonstrated and discussed:  a) changing power source on system controller from tethered (MPU) to untethered (battery) mode   b) changing power source on system controller from untethered (battery) to tethered (MPU) mode   c) how to monitor battery life both on the system controller and on each individual battery   d) changing batteries   Reviewed and supplied a copy of home inspection check list stressing that only three pronged grounded power outlets can be used for VAD equipment. Phillip Rich confirmed home has electrical outlets that will support the equipment along with access working telephone.  Identified the following lifestyle modifications while living on MCS:    1. No driving for at least three months and then only if doctor gives permission to do so.   2. No tub baths while pump implanted, and shower only when doctor gives permission.   3. No swimming or submersion in water while implanted with pump.   4. No contact sports or engaging in jumping activities.   5. Always have a backup controller, charged spare batteries, and battery clips nearby at all times in case of emergency.   6. Call the doctor or hospital contact person if any change in how the pump sounds, feels, or works.   7. Plan to sleep only when connected to the power module.   8. Do not sleep on your stomach.   9. Keep a backup system controller, charged batteries, battery clips, and flashlight near you during sleep in case  of electrical power outage.   10. Exit site care including dressing changes, monitoring for infection, and importance of keeping percutaneous lead stabilized at all times.     Extended the option to have one of our current patients and caregiver(s) come to talk with them about living on support to assist with decision making.   Reviewed pictures of VAD drive line, site care, dressing changes, and drive line stabilization including  securement attachment device and abdominal binder. Discussed with pt and family that they will be required to purchase dressing supplies as long as patient has the VAD in place.   Reinforced need for 24 hour/7 day week caregivers. He will also need to abide by sternal precautions with no lifting >10lbs, pushing, pulling and will need assistance with adapting to new life style with VAD equipment and care.   Intermacs patient survival statistics through March 2024 reviewed with patient and caregiver as follows:    The patient understands that from this discussion it does not mean that they will receive the device, but that depends on an extensive evaluation process. The patient is aware of the fact that if at anytime they want to stop the evaluation process they can.  All questions have been answered at this time and contact information was provided should they encounter any further questions.  He is agreeable at this time to the evaluation process and will move forward.   Discussed pt with Dr Shirlee Latch. All testing ordered except PFTs. Will wait for fluid status to stabilize prior to ordering PFTs. Both VAD surgeons are aware of need for consult for this pt and will plan to see him tomorrow. VAD coordinator will arrange current VAD pt to speak with pt while he is hospitalized to assist with decision making.    Total Session Time: 45 minutes  Alyce Pagan RN VAD Coordinator  Office: [phone]  24/7 Pager: [phone]

## 2023-01-28 NOTE — ED Provider Notes (Signed)
[place] [facility] AT [facility] Provider Note   CSN: [id number] Arrival date & time: 01/27/23  1739     History  Chief Complaint  Patient presents with   Shortness of Breath    Phillip Rich is a 72 y.o. male.  The history is provided by the patient.  Patient reports ever since he returned from [place] around 3 weeks ago he had increasing shortness of breath.  It is worse with exertion.  No active chest pain. He went to his cardiologist office earlier in the day on September 17 and was told to go the ER to be directly admitted to get Lasix and will likely need a right heart cath     Home Medications Prior to Admission medications   Medication Sig Start Date End Date Taking? Authorizing Provider  albuterol (VENTOLIN HFA) 108 (90 Base) MCG/ACT inhaler Inhale 1-2 puffs into the lungs every 6 (six) hours as needed for wheezing or shortness of breath. 01/05/22  Yes Elayne Snare K, DO  aspirin EC 81 MG tablet Take 81 mg by mouth daily. Swallow whole.   Yes [provider]  carvedilol (COREG) 6.25 MG tablet Take 1 tablet (6.25 mg total) by mouth 2 (two) times daily with a meal. 09/09/22  Yes Sabharwal, [name], DO  Cholecalciferol (VITAMIN D PO) Take 1,000 Units by mouth every morning.   Yes [provider]  DULoxetine (CYMBALTA) 60 MG capsule Take 60 mg by mouth daily. 05/28/21  Yes [provider]  empagliflozin (JARDIANCE) 10 MG TABS tablet Take 1 tablet (10 mg total) by mouth daily before breakfast. 09/25/22  Yes Sabharwal, [name], DO  fluticasone (FLONASE) 50 MCG/ACT nasal spray Place 1 spray into both nostrils 2 (two) times a week.   Yes [provider]  furosemide (LASIX) 20 MG tablet Take 1 tablet (20 mg total) by mouth as needed for fluid or edema. Greater than 3lb in 24 hours or 5 lb in a week 09/09/22  Yes Sabharwal, [name], DO  levocetirizine (XYZAL) 5 MG tablet Take 5 mg by mouth daily.   Yes [provider]  Melatonin 10 MG TABS Take 10 mg by mouth at bedtime as needed (Sleep).   Yes [provider]  metFORMIN (GLUCOPHAGE) 1000 MG tablet Take 1,000 mg by mouth 2 (two) times daily.  03/06/10  Yes [provider]  omeprazole (PRILOSEC) 20 MG capsule Take 1 tablet by mouth every morning.  08/06/10  Yes [provider]  sacubitril-valsartan (ENTRESTO) 97-103 MG Take 1 tablet by mouth 2 (two) times daily. 11/25/22  Yes Sabharwal, [name], DO  Semaglutide,0.25 or 0.5MG /DOS, (OZEMPIC, 0.25 OR 0.5 MG/DOSE,) 2 MG/3ML SOPN Inject 0.25 mg into the skin once a week.   Yes [provider]  zolpidem (AMBIEN) 10 MG tablet Take 10 mg by mouth at bedtime as needed for sleep.   Yes [provider]  atorvastatin (LIPITOR) 80 MG tablet Take 1 tablet (80 mg total) by mouth daily. 09/09/22 01/27/23  Sabharwal, [name], DO  insulin lispro (HUMALOG KWIKPEN) 100 UNIT/ML KwikPen Inject 12 Units into the skin 2 (two) times daily.    [provider]  vitamin B-12 (CYANOCOBALAMIN) 100 MCG tablet Take 100 mcg by mouth daily.    [provider]      Allergies    Codeine, Crestor [rosuvastatin calcium], and Erythromycin    Review of Systems   Review of Systems  Constitutional:  Negative for fever.  Respiratory:  Positive for shortness  of breath.   Cardiovascular:  Positive for leg swelling.    Physical Exam Updated Vital Signs BP 118/79 (BP Location: Left Arm)   Pulse (!) 104   Temp 97.7 F (36.5 C)   Resp 16   SpO2 100%  Physical Exam CONSTITUTIONAL: Elderly, no distress HEAD: Normocephalic/atraumatic ENMT: Mucous membranes moist NECK: supple no meningeal signs CV: S1/S2 noted LUNGS: Decreased breath sounds bilaterally, mildly tachypneic ABDOMEN: soft, nontender NEURO: Pt is awake/alert/appropriate, moves all extremitiesx4.  No facial droop.   EXTREMITIES: pulses normal/equal, full ROM, symmetric lower extremity edema SKIN: warm, color normal PSYCH:  no abnormalities of mood noted, alert and oriented to situation  ED Results / Procedures / Treatments   Labs (all labs ordered are listed, but only abnormal results are displayed) Labs Reviewed  BASIC METABOLIC PANEL - Abnormal; Notable for the following components:      Result Value   Chloride 96 (*)    Glucose, Bld 168 (*)    Creatinine, Ser 1.32 (*)    GFR, Estimated 57 (*)    Anion gap 17 (*)    All other components within normal limits  CBC - Abnormal; Notable for the following components:   RBC 3.61 (*)    Hemoglobin 11.1 (*)    HCT 35.2 (*)    RDW 15.8 (*)    All other components within normal limits  BRAIN NATRIURETIC PEPTIDE - Abnormal; Notable for the following components:   B Natriuretic Peptide >4,500.0 (*)    All other components within normal limits  TROPONIN I (HIGH SENSITIVITY) - Abnormal; Notable for the following components:   Troponin I (High Sensitivity) 90 (*)    All other components within normal limits  TROPONIN I (HIGH SENSITIVITY)    EKG EKG Interpretation Date/Time:  Tuesday January 27 2023 17:53:18 EDT Ventricular Rate:  102 PR Interval:  178 QRS Duration:  112 QT Interval:  352 QTC Calculation: 458 R Axis:   149  Text Interpretation: Sinus tachycardia Low voltage QRS Anterolateral infarct , age undetermined Abnormal ECG Confirmed by Zadie Rhine ([id number]) on 01/27/2023 11:56:37 PM  Radiology DG Chest 2 View  Result Date: 01/27/2023 CLINICAL DATA:  Shortness of breath. The patient increases home Lasix dosage for the past 4 days with minimal results. EXAM: CHEST - 2 VIEW COMPARISON:  01/20/2023 01/09/2022.  Chest CT dated 01/05/2022 FINDINGS: Normal sized heart. Tortuous and partially calcified thoracic aorta. The hemidiaphragms are mildly flattened with diffuse peribronchial thickening. Stable diffuse prominence of the interstitial markings, remaining most pronounced at the lung bases. No Kerley lines or pleural fluid seen. Thoracic spine  degenerative changes. IMPRESSION: 1. COPD with stable chronic interstitial lung disease. 2. No acute abnormality. 3. Aortic atherosclerosis. Electronically Signed   By: Beckie Salts M.D.   On: 01/27/2023 19:55    Procedures Procedures    Medications Ordered in ED Medications  furosemide (LASIX) injection 80 mg (has no administration in time range)    ED Course/ Medical Decision Making/ A&P Clinical Course as of 01/28/23 0041  Wed Jan 28, 2023  0011 B Natriuretic Peptide(!): >4,500.0 Chf noted  [DW]  0011 Glucose(!): 168 hyperglycemia [DW]  0011 Troponin I (High Sensitivity)(!): 90 Chronically elevated troponin [DW]  0040 Patient sent from cardiology clinic for admission.  Patient has a history of advanced heart failure and plan is to do extensive diuresis and potential right heart cath.  Patient has been seen by Dr. Geraldo Pitter with cardiology for admission Patient is currently stable at this time [  DW]    Clinical Course User Index [DW] Zadie Rhine, MD                                 Medical Decision Making Amount and/or Complexity of Data Reviewed Labs: ordered. Decision-making details documented in ED Course. Radiology: ordered.  Risk Decision regarding hospitalization.   This patient presents to the ED for concern of shortness of breath, this involves an extensive number of treatment options, and is a complaint that carries with it a high risk of complications and morbidity.  The differential diagnosis includes but is not limited to Acute coronary syndrome, pneumonia, acute pulmonary edema, pneumothorax, acute anemia, pulmonary embolism    Comorbidities that complicate the patient evaluation: Patient's presentation is complicated by their history of PE, COPD, CHF  Social Determinants of Health: Patient's  recent travel to [place]   increases the complexity of managing their presentation  Additional history obtained: Records reviewed  cardiology records  admit  Lab Tests: I Ordered, and personally interpreted labs.  The pertinent results include: Chronically elevated, BNP consistent with CHF  Imaging Studies ordered: I ordered imaging studies including X-ray chest   I independently visualized and interpreted imaging which showed COPD, mild pulmonary edema I agree with the radiologist interpretation   Medicines ordered and prescription drug management: I ordered medication including lasix  for CHF    Critical Interventions:  admission to cardiology sevice  Consultations Obtained: I requested consultation with the admitting physician dr Geraldo Pitter , and discussed  findings as well as pertinent plan - they recommend: cardiology will admit patient   Complexity of problems addressed: Patient's presentation is most consistent with  acute presentation with potential threat to life or bodily function  Disposition: After consideration of the diagnostic results and the patient's response to treatment,  I feel that the patent would benefit from admission   .           Final Clinical Impression(s) / ED Diagnoses Final diagnoses:  Acute systolic congestive heart failure [facility])    Rx / DC Orders ED Discharge Orders     None         Zadie Rhine, MD 01/28/23 0041

## 2023-01-28 NOTE — Progress Notes (Addendum)
Patient ID: Phillip Rich, male   DOB: May 11, 1951, 72 y.o.   MRN: [id number]     Advanced Heart Failure Rounding Note  PCP-Cardiologist: Rollene Rotunda, MD   Subjective:    Feels better with diuresis overnight.  I/Os incomplete.   RHC:  Hemodynamics (mmHg) RA mean 17 RV 57/21 PA 58/30, mean 39 PCWP mean 28 Oxygen saturations: PA 42% AO 95% Cardiac Output (Fick) 3.37  Cardiac Index (Fick) 1.66  Cardiac Output (Fick) 3.20  Cardiac Index (Fick) 1.58 PVR 3.4 WU PAPi 1.64   Objective:   Weight Range:   There is no height or weight on file to calculate BMI.   Vital Signs:   Temp:  [97.5 F (36.4 C)-97.8 F (36.6 C)] 97.8 F (36.6 C) (09/18 0823) Pulse Rate:  [0-113] 88 (09/18 1308) Resp:  [10-32] 29 (09/18 1308) BP: (97-126)/(23-90) 116/81 (09/18 1308) SpO2:  [79 %-100 %] 90 % (09/18 1308)    Weight change: There were no vitals filed for this visit.  Intake/Output:   Intake/Output Summary (Last 24 hours) at 01/28/2023 1328 Last data filed at 01/28/2023 0731 Gross per 24 hour  Intake --  Output 400 ml  Net -400 ml      Physical Exam    General:  Well appearing. No resp difficulty HEENT: Normal Neck: Supple. JVP 16 cm. Carotids 2+ bilat; no bruits. No lymphadenopathy or thyromegaly appreciated. Cor: PMI lateral. Regular rate & rhythm. No rubs, gallops or murmurs. Lungs: Clear Abdomen: Soft, nontender, nondistended. No hepatosplenomegaly. No bruits or masses. Good bowel sounds. Extremities: No cyanosis, clubbing, rash. 1+ edema to knees.  Neuro: Alert & orientedx3, cranial nerves grossly intact. moves all 4 extremities w/o difficulty. Affect pleasant   Telemetry   NSR 70s (personally reviewed)  EKG    NSR, low voltage, poor RWP (personally reviewed)  Labs    CBC Recent Labs    01/27/23 1835  WBC 9.4  HGB 11.1*  HCT 35.2*  MCV 97.5  PLT 297   Basic Metabolic Panel Recent Labs    40/98/11 1835 01/28/23 0349  NA 135 133*  K 4.1 3.7   CL 96* 101  CO2 22 21*  GLUCOSE 168* 145*  BUN 23 23  CREATININE 1.32* 1.22  CALCIUM 9.0 8.6*  MG  --  1.7   Liver Function Tests Recent Labs    01/28/23 0349  AST 98*  ALT 126*  ALKPHOS 191*  BILITOT 1.3*  PROT 6.9  ALBUMIN 2.7*   No results for input(s): "LIPASE", "AMYLASE" in the last 72 hours. Cardiac Enzymes No results for input(s): "CKTOTAL", "CKMB", "CKMBINDEX", "TROPONINI" in the last 72 hours.  BNP: BNP (last 3 results) Recent Labs    09/09/22 1529 11/25/22 1506 01/27/23 1835  BNP 357.6* 1,548.2* >4,500.0*    ProBNP (last 3 results) No results for input(s): "PROBNP" in the last 8760 hours.   D-Dimer No results for input(s): "DDIMER" in the last 72 hours. Hemoglobin A1C No results for input(s): "HGBA1C" in the last 72 hours. Fasting Lipid Panel No results for input(s): "CHOL", "HDL", "LDLCALC", "TRIG", "CHOLHDL", "LDLDIRECT" in the last 72 hours. Thyroid Function Tests No results for input(s): "TSH", "T4TOTAL", "T3FREE", "THYROIDAB" in the last 72 hours.  Invalid input(s): "FREET3"  Other results:   Imaging    CARDIAC CATHETERIZATION  Result Date: 01/28/2023 1. Low cardiac output. 2. Low PAPi 1.64 3. Elevated right and left heart filling pressures. 4. Moderate primarily pulmonary venous hypertension.   [place] EKG SITE RITE  Result  Date: 01/28/2023 If Site Rite image not attached, placement could not be confirmed due to current cardiac rhythm.  DG Chest 2 View  Result Date: 01/27/2023 CLINICAL DATA:  Shortness of breath. The patient increases home Lasix dosage for the past 4 days with minimal results. EXAM: CHEST - 2 VIEW COMPARISON:  01/20/2023 01/09/2022.  Chest CT dated 01/05/2022 FINDINGS: Normal sized heart. Tortuous and partially calcified thoracic aorta. The hemidiaphragms are mildly flattened with diffuse peribronchial thickening. Stable diffuse prominence of the interstitial markings, remaining most pronounced at the lung bases. No Kerley  lines or pleural fluid seen. Thoracic spine degenerative changes. IMPRESSION: 1. COPD with stable chronic interstitial lung disease. 2. No acute abnormality. 3. Aortic atherosclerosis. Electronically Signed   By: Beckie Salts M.D.   On: 01/27/2023 19:55     Medications:     Scheduled Medications:  [MAR Hold] atorvastatin  80 mg Oral Daily   carvedilol  3.125 mg Oral BID WC   [MAR Hold] cholecalciferol  1,000 Units Oral q morning   [MAR Hold] DULoxetine  60 mg Oral Daily   [MAR Hold] enoxaparin (LOVENOX) injection  40 mg Subcutaneous Q24H   [MAR Hold] fluticasone  1 spray Each Nare Once per day on Monday Thursday   [facility] Hold] insulin aspart  0-9 Units Subcutaneous TID WC   [MAR Hold] loratadine  10 mg Oral Daily   [MAR Hold] pantoprazole  40 mg Oral Daily   [MAR Hold] sacubitril-valsartan  1 tablet Oral BID   [MAR Hold] sodium chloride flush  3 mL Intravenous Q12H    Infusions:  [MAR Hold] sodium chloride     sodium chloride     furosemide (LASIX) 200 mg in dextrose 5 % 100 mL (2 mg/mL) infusion     milrinone      PRN Medications: [MAR Hold] sodium chloride, [MAR Hold] acetaminophen, Heparin (Porcine) in NaCl, lidocaine (PF), [MAR Hold] melatonin, [MAR Hold] ondansetron (ZOFRAN) IV, [MAR Hold] sodium chloride flush    Assessment/Plan   1. Acute on chronic systolic CHF: Echo in 7/24 with EF 25-30%, mild LV dilation, inferior akinesis, RV reported normal.  Suspect mixed ischemic/nonischemic cardiomyopathy.  Patient was admitted with volume overload.  RHC today showed elevated right and left heart filling pressures with CI 1.66 F/1.58 T.  PAPi low at 1.64.  Volume overloaded on exam though feels better with initial diuresis.  Low output HF.  - Place PICC.  - Start milrinone 0.25 mcg/kg/min.  Follow co-ox off PICC.  - Start Lasix gtt 12 mg/hr, follow CVP.  - BP stable, continue home Entresto.  - Add digoxin 0.125 daily.  - Decrease Coreg to 3.125 mg bid with low output/volume  overload.  - Repeat echo this admission.  - With low output HF, he will need evaluation for LVAD.  I think he would be a reasonable candidate.  He has a lot of questions about LVAD.  Would plan to optimized hemodynamically with milrinone and Lasix gtts then reassess.  - He does not have an ICD, not CRT candidate with narrow QRS.  ICD placement will depend on +/- LVAD and timing.  2. CAD: Cath in 9/23 with occluded mRCA, 70% long LCx stenosis, LAD ok.  No chest pain.   - Continue statin and ASA 81 3. H/o PE: Remote, has not been anticoagulated.  4. Type 2 diabetes: SSI for now.    Length of Stay: 0  Marca Ancona, MD  01/28/2023, 1:28 PM  Advanced Heart Failure Team Pager [phone] (  M-F; 7a - 5p)  Please contact [facility] for night-coverage after hours (5p -7a ) and weekends on [email or web address]

## 2023-01-28 NOTE — Progress Notes (Signed)
Admitted with A./C HFrEF  Plan for RHC today to further assess hemodynamics. Orders placed.  The patient understands that risks included but are not limited to stroke (1 in 1000), death (1 in 1000), kidney failure [usually temporary] (1 in 500), bleeding (1 in 200), allergic reaction [possibly serious] (1 in 200).  The patient understands and agrees to proceed.  Zaylyn Bergdoll NP-C  10:20 AM

## 2023-01-28 NOTE — Plan of Care (Signed)

## 2023-01-28 NOTE — Progress Notes (Signed)
Heart Failure Navigator Progress Note  Assessed for Heart & Vascular TOC clinic readiness.  Patient does not meet criteria due to Advanced Heart Failure Team patient of Dr. Sabharwal.   Navigator will sign off at this time.    Phillip Rich, BSN, RN Heart Failure Nurse Navigator Secure Chat Only   

## 2023-01-29 ENCOUNTER — Inpatient Hospital Stay (HOSPITAL_COMMUNITY): Payer: Medicare Other

## 2023-01-29 ENCOUNTER — Other Ambulatory Visit (HOSPITAL_COMMUNITY): Payer: Medicare Other

## 2023-01-29 ENCOUNTER — Encounter (HOSPITAL_COMMUNITY): Payer: Self-pay | Admitting: Cardiology

## 2023-01-29 DIAGNOSIS — Z515 Encounter for palliative care: Secondary | ICD-10-CM

## 2023-01-29 DIAGNOSIS — I5023 Acute on chronic systolic (congestive) heart failure: Secondary | ICD-10-CM | POA: Diagnosis not present

## 2023-01-29 DIAGNOSIS — Z01810 Encounter for preprocedural cardiovascular examination: Secondary | ICD-10-CM

## 2023-01-29 DIAGNOSIS — Z7189 Other specified counseling: Secondary | ICD-10-CM | POA: Diagnosis not present

## 2023-01-29 DIAGNOSIS — I255 Ischemic cardiomyopathy: Secondary | ICD-10-CM | POA: Diagnosis not present

## 2023-01-29 DIAGNOSIS — I509 Heart failure, unspecified: Secondary | ICD-10-CM | POA: Diagnosis not present

## 2023-01-29 DIAGNOSIS — E440 Moderate protein-calorie malnutrition: Secondary | ICD-10-CM | POA: Insufficient documentation

## 2023-01-29 LAB — CBC WITH DIFFERENTIAL/PLATELET
Abs Immature Granulocytes: 0.01 10*3/uL (ref 0.00–0.07)
Basophils Absolute: 0.1 10*3/uL (ref 0.0–0.1)
Basophils Relative: 1 %
Eosinophils Absolute: 0.7 10*3/uL — ABNORMAL HIGH (ref 0.0–0.5)
Eosinophils Relative: 8 %
HCT: 29.3 % — ABNORMAL LOW (ref 39.0–52.0)
Hemoglobin: 9.8 g/dL — ABNORMAL LOW (ref 13.0–17.0)
Immature Granulocytes: 0 %
Lymphocytes Relative: 23 %
Lymphs Abs: 1.9 10*3/uL (ref 0.7–4.0)
MCH: 31.1 pg (ref 26.0–34.0)
MCHC: 33.4 g/dL (ref 30.0–36.0)
MCV: 93 fL (ref 80.0–100.0)
Monocytes Absolute: 0.6 10*3/uL (ref 0.1–1.0)
Monocytes Relative: 7 %
Neutro Abs: 5.2 10*3/uL (ref 1.7–7.7)
Neutrophils Relative %: 61 %
Platelets: 214 10*3/uL (ref 150–400)
RBC: 3.15 MIL/uL — ABNORMAL LOW (ref 4.22–5.81)
RDW: 15.9 % — ABNORMAL HIGH (ref 11.5–15.5)
WBC: 8.5 10*3/uL (ref 4.0–10.5)
nRBC: 0 % (ref 0.0–0.2)

## 2023-01-29 LAB — ECHOCARDIOGRAM COMPLETE
AR max vel: 1.69 cm2
AV Area VTI: 1.55 cm2
AV Area mean vel: 1.63 cm2
AV Mean grad: 4 mmHg
AV Peak grad: 6.5 mmHg
Ao pk vel: 1.27 m/s
Area-P 1/2: 5.13 cm2
S' Lateral: 4.9 cm
Weight: 2950.4 oz

## 2023-01-29 LAB — LIPID PANEL
Cholesterol: 97 mg/dL (ref 0–200)
HDL: 21 mg/dL — ABNORMAL LOW (ref >40)
LDL Cholesterol: 61 mg/dL (ref 0–99)
Total CHOL/HDL Ratio: 4.6 RATIO
Triglycerides: 76 mg/dL (ref <150)
VLDL: 15 mg/dL (ref 0–40)

## 2023-01-29 LAB — GLUCOSE, CAPILLARY
Glucose-Capillary: 122 mg/dL — ABNORMAL HIGH (ref 70–99)
Glucose-Capillary: 119 mg/dL — ABNORMAL HIGH (ref 70–99)
Glucose-Capillary: 138 mg/dL — ABNORMAL HIGH (ref 70–99)
Glucose-Capillary: 126 mg/dL — ABNORMAL HIGH (ref 70–99)

## 2023-01-29 LAB — HEMOGLOBIN A1C
Hgb A1c MFr Bld: 6.2 % — ABNORMAL HIGH (ref 4.8–5.6)
Mean Plasma Glucose: 131.24 mg/dL

## 2023-01-29 LAB — T4, FREE: Free T4: 1.46 ng/dL — ABNORMAL HIGH (ref 0.61–1.12)

## 2023-01-29 LAB — BASIC METABOLIC PANEL
Anion gap: 13 (ref 5–15)
BUN: 26 mg/dL — ABNORMAL HIGH (ref 8–23)
CO2: 26 mmol/L (ref 22–32)
Calcium: 8.6 mg/dL — ABNORMAL LOW (ref 8.9–10.3)
Chloride: 98 mmol/L (ref 98–111)
Creatinine, Ser: 1.37 mg/dL — ABNORMAL HIGH (ref 0.61–1.24)
GFR, Estimated: 55 mL/min — ABNORMAL LOW (ref >60)
Glucose, Bld: 162 mg/dL — ABNORMAL HIGH (ref 70–99)
Potassium: 3.4 mmol/L — ABNORMAL LOW (ref 3.5–5.1)
Sodium: 137 mmol/L (ref 135–145)

## 2023-01-29 LAB — COOXEMETRY PANEL
Carboxyhemoglobin: 2.2 % — ABNORMAL HIGH (ref 0.5–1.5)
Methemoglobin: 0.7 % (ref 0.0–1.5)
O2 Saturation: 55.6 %
Total hemoglobin: 10.3 g/dL — ABNORMAL LOW (ref 12.0–16.0)

## 2023-01-29 LAB — VAS US DOPPLER PRE VAD

## 2023-01-29 LAB — APTT: aPTT: 41 seconds — ABNORMAL HIGH (ref 24–36)

## 2023-01-29 LAB — HEPATITIS B SURFACE ANTIBODY,QUALITATIVE: Hep B S Ab: NONREACTIVE

## 2023-01-29 LAB — PREALBUMIN: Prealbumin: 11 mg/dL — ABNORMAL LOW (ref 18–38)

## 2023-01-29 LAB — PROTIME-INR
INR: 1.3 — ABNORMAL HIGH (ref 0.8–1.2)
Prothrombin Time: 16.5 seconds — ABNORMAL HIGH (ref 11.4–15.2)

## 2023-01-29 LAB — TSH: TSH: 1.396 u[IU]/mL (ref 0.350–4.500)

## 2023-01-29 LAB — HEPATITIS B CORE ANTIBODY, TOTAL: Hep B Core Total Ab: NONREACTIVE

## 2023-01-29 LAB — HEPATITIS B SURFACE ANTIGEN: Hepatitis B Surface Ag: NONREACTIVE

## 2023-01-29 LAB — LACTATE DEHYDROGENASE: LDH: 196 U/L — ABNORMAL HIGH (ref 98–192)

## 2023-01-29 LAB — ABO/RH: ABO/RH(D): O POS

## 2023-01-29 LAB — PSA: Prostatic Specific Antigen: 1.63 ng/mL (ref 0.00–4.00)

## 2023-01-29 LAB — URIC ACID: Uric Acid, Serum: 9.5 mg/dL — ABNORMAL HIGH (ref 3.7–8.6)

## 2023-01-29 LAB — ANTITHROMBIN III: AntiThromb III Func: 74 % — ABNORMAL LOW (ref 75–120)

## 2023-01-29 MED ORDER — SODIUM CHLORIDE 0.9% FLUSH
10.0000 mL | Freq: Two times a day (BID) | INTRAVENOUS | Status: DC
  Administered 2023-01-29 – 2023-01-30 (×3): 10 mL
  Administered 2023-01-31: 20 mL
  Administered 2023-01-31 – 2023-02-04 (×6): 10 mL

## 2023-01-29 MED ORDER — PROSOURCE PLUS PO LIQD
30.0000 mL | Freq: Three times a day (TID) | ORAL | Status: DC
  Administered 2023-01-29 – 2023-02-04 (×16): 30 mL via ORAL
  Filled 2023-01-29 (×16): qty 30

## 2023-01-29 MED ORDER — HEPARIN BOLUS VIA INFUSION
3000.0000 [IU] | Freq: Once | INTRAVENOUS | Status: AC
  Administered 2023-01-29: 3000 [IU] via INTRAVENOUS
  Filled 2023-01-29: qty 3000

## 2023-01-29 MED ORDER — SODIUM CHLORIDE 0.9% FLUSH: 10.0000 mL | INTRAVENOUS | Status: DC | PRN

## 2023-01-29 MED ORDER — SACUBITRIL-VALSARTAN 49-51 MG PO TABS
1.0000 | ORAL_TABLET | Freq: Two times a day (BID) | ORAL | Status: DC
  Administered 2023-01-29 – 2023-01-30 (×2): 1 via ORAL
  Filled 2023-01-29 (×2): qty 1

## 2023-01-29 MED ORDER — SPIRONOLACTONE 12.5 MG HALF TABLET
12.5000 mg | ORAL_TABLET | Freq: Every day | ORAL | Status: DC
  Administered 2023-01-29 – 2023-02-01 (×4): 12.5 mg via ORAL
  Filled 2023-01-29 (×4): qty 1

## 2023-01-29 MED ORDER — PERFLUTREN LIPID MICROSPHERE
1.0000 mL | INTRAVENOUS | Status: AC | PRN
  Administered 2023-01-29: 3 mL via INTRAVENOUS

## 2023-01-29 MED ORDER — CHLORHEXIDINE GLUCONATE CLOTH 2 % EX PADS
6.0000 | MEDICATED_PAD | Freq: Every day | CUTANEOUS | Status: DC
  Administered 2023-01-29 – 2023-02-04 (×7): 6 via TOPICAL

## 2023-01-29 MED ORDER — POTASSIUM CHLORIDE CRYS ER 20 MEQ PO TBCR
40.0000 meq | EXTENDED_RELEASE_TABLET | Freq: Two times a day (BID) | ORAL | Status: AC
  Administered 2023-01-29 (×2): 40 meq via ORAL
  Filled 2023-01-29: qty 4
  Filled 2023-01-29: qty 2

## 2023-01-29 MED ORDER — ADULT MULTIVITAMIN W/MINERALS CH
1.0000 | ORAL_TABLET | Freq: Every day | ORAL | Status: DC
  Administered 2023-01-29 – 2023-02-04 (×7): 1 via ORAL
  Filled 2023-01-29 (×7): qty 1

## 2023-01-29 MED ORDER — HEPARIN (PORCINE) 25000 UT/250ML-% IV SOLN
1100.0000 [IU]/h | INTRAVENOUS | Status: DC
  Administered 2023-01-29 – 2023-02-03 (×6): 1100 [IU]/h via INTRAVENOUS
  Filled 2023-01-29 (×6): qty 250

## 2023-01-29 MED ORDER — ENSURE ENLIVE PO LIQD
237.0000 mL | Freq: Two times a day (BID) | ORAL | Status: DC
  Administered 2023-01-29 – 2023-02-02 (×5): 237 mL via ORAL

## 2023-01-29 NOTE — Progress Notes (Signed)
VASCULAR LAB    Pre VAD Dopplers have been performed.  See CV proc for preliminary results.   Beverly Ferner, RVT 01/29/2023, 12:43 PM

## 2023-01-29 NOTE — Progress Notes (Addendum)
Patient ID: Phillip Rich, male   DOB: 1950-08-31, 72 y.o.   MRN: [id number]     Advanced Heart Failure Rounding Note  PCP-Cardiologist: Rollene Rotunda, MD   Subjective:    RHC:  Hemodynamics (mmHg) RA mean 17 RV 57/21 PA 58/30, mean 39 PCWP mean 28 Oxygen saturations: PA 42% AO 95% Cardiac Output (Fick) 3.37  Cardiac Index (Fick) 1.66  Cardiac Output (Fick) 3.20  Cardiac Index (Fick) 1.58 PVR 3.4 WU PAPi 1.64   Post cath started on milrinone and lasix drip.  Negative 1.3 liters. VAD work up started.   CO-OX pending.   Feels ok.  Objective:   Weight Range: 83.6 kg Body mass index is 27.23 kg/m.   Vital Signs:   Temp:  [97.5 F (36.4 C)-98 F (36.7 C)] 97.5 F (36.4 C) (09/19 0711) Pulse Rate:  [0-110] 91 (09/19 0711) Resp:  [11-32] 19 (09/19 0711) BP: (87-122)/(51-93) 87/51 (09/19 0711) SpO2:  [85 %-100 %] 98 % (09/19 0711) Weight:  [83.6 kg] 83.6 kg (09/19 0511) Last BM Date : 01/27/23  Weight change: Filed Weights   01/29/23 0511  Weight: 83.6 kg    Intake/Output:   Intake/Output Summary (Last 24 hours) at 01/29/2023 1031 Last data filed at 01/29/2023 0753 Gross per 24 hour  Intake 760.42 ml  Output 1400 ml  Net -639.58 ml    CVP 6-7   Physical Exam   General:  Well appearing. No resp difficulty HEENT: normal Neck: supple. JVP 6-7 . Carotids 2+ bilat; no bruits. No lymphadenopathy or thryomegaly appreciated. Cor: PMI nondisplaced. Regular rate & rhythm. No rubs, gallops or murmurs. Lungs: clear Abdomen: soft, nontender, nondistended. No hepatosplenomegaly. No bruits or masses. Good bowel sounds. Extremities: no cyanosis, clubbing, rash, R and LLE 1+ edema. RUE PICC  Neuro: alert & orientedx3, cranial nerves grossly intact. moves all 4 extremities w/o difficulty. Affect pleasant  Telemetry  SR 70-80s   EKG    NSR, low voltage, poor RWP (personally reviewed)  Labs    CBC Recent Labs    01/27/23 1835 01/28/23 1306  WBC 9.4  --    HGB 11.1* 10.9*  10.9*  HCT 35.2* 32.0*  32.0*  MCV 97.5  --   PLT 297  --    Basic Metabolic Panel Recent Labs    08/65/78 0349 01/28/23 1306 01/29/23 0259  NA 133* 137  137 137  K 3.7 3.9  3.9 3.4*  CL 101  --  98  CO2 21*  --  26  GLUCOSE 145*  --  162*  BUN 23  --  26*  CREATININE 1.22  --  1.37*  CALCIUM 8.6*  --  8.6*  MG 1.7  --   --    Liver Function Tests Recent Labs    01/28/23 0349  AST 98*  ALT 126*  ALKPHOS 191*  BILITOT 1.3*  PROT 6.9  ALBUMIN 2.7*   No results for input(s): "LIPASE", "AMYLASE" in the last 72 hours. Cardiac Enzymes No results for input(s): "CKTOTAL", "CKMB", "CKMBINDEX", "TROPONINI" in the last 72 hours.  BNP: BNP (last 3 results) Recent Labs    09/09/22 1529 11/25/22 1506 01/27/23 1835  BNP 357.6* 1,548.2* >4,500.0*    ProBNP (last 3 results) No results for input(s): "PROBNP" in the last 8760 hours.   D-Dimer No results for input(s): "DDIMER" in the last 72 hours. Hemoglobin A1C Recent Labs    01/29/23 0259  HGBA1C 6.2*   Fasting Lipid Panel Recent Labs  01/29/23 0259  CHOL 97  HDL 21*  LDLCALC 61  TRIG 76  CHOLHDL 4.6   Thyroid Function Tests Recent Labs    01/29/23 0259  TSH 1.396    Other results:   Imaging    CT CHEST ABDOMEN PELVIS WO CONTRAST  Result Date: 01/28/2023 CLINICAL DATA:  LVAD preop evaluation. EXAM: CT CHEST, ABDOMEN AND PELVIS WITHOUT CONTRAST TECHNIQUE: Multidetector CT imaging of the chest, abdomen and pelvis was performed following the standard protocol without IV contrast. RADIATION DOSE REDUCTION: This exam was performed according to the departmental dose-optimization program which includes automated exposure control, adjustment of the mA and/or kV according to patient size and/or use of iterative reconstruction technique. COMPARISON:  Chest CT 01/05/2022.  Abdominopelvic CT 07/31/2021 FINDINGS: CT CHEST FINDINGS Cardiovascular: Cardiomegaly. No pericardial effusion.  Atherosclerosis of the thoracic aorta. No aortic aneurysm. Dilated main pulmonary artery at 3.4 cm. No pericardial effusion. Mediastinum/Nodes: Small hiatal hernia. Shotty and mildly enlarged mediastinal lymph nodes. This is similar to prior exam. Lower anterior paratracheal node measures 14 mm, previously 15 mm. There are prominent right supraclavicular nodes, chronic. Assessment for hilar adenopathy is limited on this unenhanced exam. No thyroid nodule. No axillary adenopathy. Lungs/Pleura: Small to moderate right and small left pleural effusion. Effusions have diminished from CT last year. Fluid tracks into the right minor fissure. Chronic interstitial lung disease with fibrosis and honeycombing. No convincing progression from prior exam. No dominant pulmonary mass. Trace retained mucus in the trachea. Musculoskeletal: Minimal bilateral gynecomastia. Thoracic spondylosis with anterior spurring. There are no acute or suspicious osseous abnormalities. CT ABDOMEN PELVIS FINDINGS Hepatobiliary: Unremarkable unenhanced appearance of the liver. No focal liver abnormality. Gallbladder physiologically distended, no calcified stone. No biliary dilatation. Pancreas: Mild parenchymal atrophy. No ductal dilatation or inflammation. Spleen: Normal in size without focal abnormality. Adrenals/Urinary Tract: Normal adrenal glands. Ovoid hyperdense lesion in the right renal sinus spans 2.3 cm, unchanged from prior exam, previously characterized as hyperdense cyst. The additional small renal lesions on prior not as well seen today in the absence of contrast. Postsurgical change in the anterior lower left kidney. No hydronephrosis. Mild chronic bilateral perinephric edema. No renal calculi. Partially distended urinary bladder. There is a small chronic anterior bladder diverticula. No bladder wall thickening. Stomach/Bowel: Colonic diverticulosis without diverticulitis. Moderate volume of stool in the colon. No small bowel  obstruction or inflammation. Small hiatal hernia. There is ingested material within the stomach. The appendix is normal. Vascular/Lymphatic: Aortic atherosclerosis. No aortic aneurysm. There is an enlarged pericaval node in the upper abdomen measuring 14 mm short axis series 3, image 60. This was not seen on prior exam. Enlarged lymph node in the gastrohepatic ligament measuring 16 mm series 3, image 62. This is also new. There additional prominent lymph nodes in the upper abdomen. No retroperitoneal or pelvic adenopathy. Reproductive: Surgical clips in the prostate. Penile prosthesis with pump on the right. Other: No ascites. Small amount of free fluid in the pelvis, minimally complex. No abdominal ascites. There is no free air. No abdominal wall hernia. Musculoskeletal: Mild lumbar facet hypertrophy. There are no acute or suspicious osseous abnormalities. IMPRESSION: 1. Small to moderate right and small left pleural effusions. 2. Chronic interstitial lung disease with fibrosis and honeycombing. No convincing progression from prior exam. 3. Cardiomegaly. Dilated main pulmonary artery suggesting pulmonary arterial hypertension. 4. Enlarged lymph nodes in the upper abdomen, new from prior exam, largest 16 mm short axis in the gastrohepatic ligament. These are nonspecific. Shotty mediastinal lymph nodes  are chronic finding, likely related to known interstitial lung disease. 5. Colonic diverticulosis without diverticulitis. 6. Small hiatal hernia. 7. Hyperdense cyst in the right kidney, previously characterized. This needs no further imaging follow-up. Aortic Atherosclerosis (ICD10-I70.0). Electronically Signed   By: Narda Rutherford M.D.   On: 01/28/2023 18:39   DG Orthopantogram  Result Date: 01/28/2023 CLINICAL DATA:  Preoperative evaluation to rule out surgical contraindication. Preop LVAD. EXAM: ORTHOPANTOGRAM/PANORAMIC COMPARISON:  None Available. FINDINGS: Dental fillings of all upper teeth and the majority  of the lower teeth. No gross dental caries. No evidence of periapical lucency. There are few missing lower teeth on the right. No erosive or bony destructive change. IMPRESSION: No gross dental caries or periapical lucencies. Multiple dental fillings. Electronically Signed   By: Narda Rutherford M.D.   On: 01/28/2023 18:18   CARDIAC CATHETERIZATION  Result Date: 01/28/2023 1. Low cardiac output. 2. Low PAPi 1.64 3. Elevated right and left heart filling pressures. 4. Moderate primarily pulmonary venous hypertension.   [place] EKG SITE RITE  Result Date: 01/28/2023 If Site Rite image not attached, placement could not be confirmed due to current cardiac rhythm.    Medications:     Scheduled Medications:  aspirin  81 mg Oral Pre-Cath   aspirin  81 mg Oral Daily   atorvastatin  80 mg Oral Daily   Chlorhexidine Gluconate Cloth  6 each Topical Daily   cholecalciferol  1,000 Units Oral q morning   digoxin  0.125 mg Oral Daily   DULoxetine  60 mg Oral Daily   enoxaparin (LOVENOX) injection  40 mg Subcutaneous Q24H   fluticasone  1 spray Each Nare Once per day on Monday Thursday   insulin aspart  0-9 Units Subcutaneous TID WC   loratadine  10 mg Oral Daily   pantoprazole  40 mg Oral Daily   sacubitril-valsartan  1 tablet Oral BID   sodium chloride flush  10-40 mL Intracatheter Q12H   sodium chloride flush  3 mL Intravenous Q12H    Infusions:  sodium chloride     furosemide (LASIX) 200 mg in dextrose 5 % 100 mL (2 mg/mL) infusion 12 mg/hr (01/29/23 0242)   milrinone 0.25 mcg/kg/min (01/29/23 0753)    PRN Medications: sodium chloride, acetaminophen, melatonin, ondansetron (ZOFRAN) IV, sodium chloride flush, sodium chloride flush    Assessment/Plan   1. Acute on chronic systolic CHF: Echo in 7/24 with EF 25-30%, mild LV dilation, inferior akinesis, RV reported normal.  Suspect mixed ischemic/nonischemic cardiomyopathy.  Patient was admitted with volume overload.  RHC today showed elevated  right and left heart filling pressures with CI 1.66 F/1.58 T.  PAPi low at 1.64.  Volume overloaded on exam though feels better with initial diuresis.  Low output HF.  - CO-OX pending. Continue  milrinone 0.25 mcg/kg/min.   - CVP 6-7. Continue Lasix gtt 12 mg/hr.  - BP  soft. Stop coreg.  - Cut back entresto 24-26 mg twice a day. May need to stop.   - Continue digoxin 0.125 daily.  - Decrease Coreg to 3.125 mg bid with low output/volume overload.  - VAD work up started. ECHO pending.  - He does not have an ICD, not CRT candidate with narrow QRS.  ICD placement will depend on +/- LVAD and timing.  2. CAD: Cath in 9/23 with occluded mRCA, 70% long LCx stenosis, LAD ok.  No chest  pain.  - Continue statin and ASA 81 3. H/o PE: Remote, has not been anticoagulated.  4. Type  2 diabetes: SSI for now.    Length of Stay: 1  Amy Clegg, NP  01/29/2023, 10:31 AM  Advanced Heart Failure Team Pager [phone] (M-F; 7a - 5p)  Please contact [facility] for night-coverage after hours (5p -7a ) and weekends on [email or web address]  Patient seen with NP, agree with the above note.    He feels better today with milrinone 0.25 running.  He says he diuresed a lot, not sure everything was recorded.  CVP 9 today on my read, co-ox 56%.   CT chest today showed interstitial lung disease with honeycombing.  Patient quit smoking recently, no history of occupational exposures.   Echo today showed EF 20-25% with thrombus on the basal inferior wall, normal RV systolic function, mild-moderate Phillip.   General: NAD Neck: JVP 8-9 cm, no thyromegaly or thyroid nodule.  Lungs: Clear to auscultation bilaterally with normal respiratory effort. CV: Nondisplaced PMI.  Heart regular S1/S2, no S3/S4, no murmur.  1+ edema to knees.  Abdomen: Soft, nontender, no hepatosplenomegaly, no distention.  Skin: Intact without lesions or rashes.  Neurologic: Alert and oriented x 3.  Psych: Normal affect. Extremities: No clubbing or cyanosis.   HEENT: Normal.   Continue milrinone 0.25 today, continue Lasix gtt 12 mg/hr for 1 more day, probably can transition to po tomorrow.  Continue digoxin, decrease Entresto to 49/51 bid.  Stop Coreg for now with low output.  Will use spironolactone 12.5 daily.   I think that Phillip Rich is inotrope-dependent at this point, co-ox only up to 56% on milrinone 0.25.  VAD workup ongoing, CT chest concerning with ILD/honeycombing though he is not on home oxygen.  - Continue LVAD workup.  - He will need PFTs and would like pulmonary to evaluate him.   LV thrombus found today, heparin gtt started.   Marca Ancona 01/29/2023 3:42 PM

## 2023-01-29 NOTE — Progress Notes (Signed)
ANTICOAGULATION CONSULT NOTE - Initial Consult  Pharmacy Consult for heparin Indication: new LV thrombus  Allergies  Allergen Reactions   Codeine Nausea And Vomiting   Crestor [Rosuvastatin Calcium] Other (See Comments)    Leg aches   Erythromycin Rash    All Mycin drugs    Patient Measurements: Weight: 83.6 kg (184 lb 6.4 oz)   Vital Signs: Temp: 97.5 F (36.4 C) (09/19 0711) Temp Source: Oral (09/19 0711) BP: 87/51 (09/19 0711) Pulse Rate: 91 (09/19 0711)  Labs: Recent Labs    01/27/23 1835 01/28/23 0031 01/28/23 0349 01/28/23 1306 01/29/23 0259 01/29/23 1100  HGB 11.1*  --   --  10.9*  10.9*  --  9.8*  HCT 35.2*  --   --  32.0*  32.0*  --  29.3*  PLT 297  --   --   --   --  214  APTT  --   --   --   --  41*  --   LABPROT  --   --   --   --  16.5*  --   INR  --   --   --   --  1.3*  --   CREATININE 1.32*  --  1.22  --  1.37*  --   TROPONINIHS 90* 101*  --   --   --   --     Estimated Creatinine Clearance: 48.7 mL/min (A) (by C-G formula based on SCr of 1.37 mg/dL (H)).   Medical History: Past Medical History:  Diagnosis Date   Anxiety    BPH (benign prostatic hyperplasia)    Complication of anesthesia    Diabetes mellitus    DJD (degenerative joint disease)    GERD (gastroesophageal reflux disease)    HTN (hypertension)    Hyperlipidemia    ILD (interstitial lung disease) (HCC)    Left renal mass    Melanoma (HCC)    Mild sleep apnea    Sleep study pending   PONV (postoperative nausea and vomiting)       Assessment: 71yom with HF EF 25% admitted with volume overload and low cardiac output after RHC and started on milrinone New LV thrombus found on ECHO  W/u for possible LVAD - will dose with heparin for anticoagulation CBC stable no bleeding noted   Goal of Therapy:  Heparin level 0.3-0.7 units/ml Monitor platelets by anticoagulation protocol: Yes   Plan:  Heparin bolus 3000 uts IV x1 Heparin drip 1100 uts/hr  Heparin level in 6hr  from start Daily heparin level and CBC  Monitor s/s bleeding    Leota Sauers Pharm.D. CPP, BCPS Clinical Pharmacist [phone] 01/29/2023 3:01 PM

## 2023-01-29 NOTE — Progress Notes (Signed)
VASCULAR LAB    Bilateral lower extremity venous duplex has been performed.  See CV proc for preliminary results.   Halton Neas, RVT 01/29/2023, 12:43 PM

## 2023-01-29 NOTE — Consult Note (Addendum)
[address]            [place] [address]          [phone]       Phillip Rich [facility] Medical Record #[id number] Date of Birth: [birth year]  No ref. provider found Dr. Alinda Deem, Ann Maki, MD (Inactive)  Chief Complaint:    Chief Complaint  Patient presents with   Shortness of Breath  Patient examined, images of coronary angiogram, latest 2D echocardiogram, CT scan of chest and abdomen, and right heart cath data all personally reviewed and counseled with patient.  History of Present Illness:     72 year old diabetic reformed smoker admitted to the hospital for progressive symptoms of heart failure, shortness of breath, and decreased cardiac output.  The patient has been followed in the advanced heart failure clinic for a year for ischemic cardiomyopathy.  His symptoms of heart failure initially responded to guideline directed medical therapy and 6 months ago a right heart cath showed adequate cardiac output With echo EF 30%.  In the past several weeks he has had increased shortness of breath, poor exercise tolerance, increased weight and ankle edema and was admitted from the heart failure clinic.  Because of poor right heart cath data and coox 42% he is now on IV milrinone.  Follow-up echocardiogram performed today shows worsening LV function with EF 20-25, mild RV dysfunction, no significant AI, moderate MR, mild-moderate TR.  There is a thrombus noted in the LV chamber.   The patient's previous CPX demonstrate the O2 max of 10.9 The patient denies history of alcohol use. The patient is status post partial left nephrectomy for a benign tumor with adequate renal function.  He had no difficulty with anesthesia or bleeding. The patient has no history of stroke. The patient has been on Eliquis in the past without bleeding complications.  The patient has a long history of smoking (greater than 50 pack years) and stopped 2023.  Chest CT  scan shows significant fibrosis with honeycombing of the lower lobes.  No evidence of mediastinal adenopathy or at risk pulmonary nodules.  in 2012 the patient had PFTs which showed adequate mechanics however the diffusion capacity was low at 52%.  Repeat PFTs are pending currently.  Current Activity/ Functional Status: Currently the patient's functional status is poor due to dyspnea on exertion, walking 10 to 20 feet and resting   Past Medical History:  Diagnosis Date   Anxiety    BPH (benign prostatic hyperplasia)    Complication of anesthesia    Diabetes mellitus    DJD (degenerative joint disease)    GERD (gastroesophageal reflux disease)    HTN (hypertension)    Hyperlipidemia    ILD (interstitial lung disease) (HCC)    Left renal mass    Melanoma (HCC)    Mild sleep apnea    Sleep study pending   PONV (postoperative nausea and vomiting)     Past Surgical History:  Procedure Laterality Date   COLONOSCOPY WITH PROPOFOL N/A 04/03/2014   Procedure: COLONOSCOPY WITH PROPOFOL;  Surgeon: Charolett Bumpers, MD;  Location: [facility] ENDOSCOPY;  Service: Endoscopy;  Laterality: N/A;   KNEE ARTHROSCOPY Right    x2 right, x5 left   LASIK     MELANOMA EXCISION Left    NEPHRECTOMY Left 2022   partial   PROSTATE ABLATION     RIGHT HEART  CATH N/A 08/05/2022   Procedure: RIGHT HEART CATH;  Surgeon: Dorthula Nettles, DO;  Location: [facility] INVASIVE CV LAB;  Service: Cardiovascular;  Laterality: N/A;   RIGHT HEART CATH N/A 01/28/2023   Procedure: RIGHT HEART CATH;  Surgeon: Laurey Morale, MD;  Location: [facility] INVASIVE CV LAB;  Service: Cardiovascular;  Laterality: N/A;   RIGHT/LEFT HEART CATH AND CORONARY ANGIOGRAPHY N/A 01/15/2022   Procedure: RIGHT/LEFT HEART CATH AND CORONARY ANGIOGRAPHY;  Surgeon: Iran Ouch, MD;  Location: [facility] INVASIVE CV LAB;  Service: Cardiovascular;  Laterality: N/A;   TOTAL KNEE ARTHROPLASTY     left   VASECTOMY      Social History   Tobacco Use  Smoking Status  Former   Current packs/day: 0.00   Average packs/day: 1 pack/day for 50.0 years (50.0 ttl pk-yrs)   Types: Cigarettes   Start date: 10/17/1971   Quit date: 10/16/2021   Years since quitting: 1.2  Smokeless Tobacco Never    Social History   Substance and Sexual Activity  Alcohol Use Yes   Comment: rare occ.    Social History   Socioeconomic History   Marital status: Single    Spouse name: Not on file   Number of children: 0   Years of education: Not on file   Highest education level: High school graduate  Occupational History   Occupation: retired  Tobacco Use   Smoking status: Former    Current packs/day: 0.00    Average packs/day: 1 pack/day for 50.0 years (50.0 ttl pk-yrs)    Types: Cigarettes    Start date: 10/17/1971    Quit date: 10/16/2021    Years since quitting: 1.2   Smokeless tobacco: Never  Vaping Use   Vaping status: Never Used  Substance and Sexual Activity   Alcohol use: Yes    Comment: rare occ.   Drug use: Yes    Types: Marijuana    Comment: 07/29/21 3 x week to help sleep   Sexual activity: Not on file  Other Topics Concern   Not on file  Social History Narrative   Lives alone.  Divorced.  Retired Engineer, technical sales.       Social Determinants of Health   Financial Resource Strain: Medium Risk (01/16/2022)   Overall Financial Resource Strain (CARDIA)    Difficulty of Paying Living Expenses: Somewhat hard  Food Insecurity: No Food Insecurity (01/28/2023)   Hunger Vital Sign    Worried About Running Out of Food in the Last Year: Never true    Ran Out of Food in the Last Year: Never true  Transportation Needs: No Transportation Needs (01/28/2023)   PRAPARE - Administrator, Civil Service (Medical): No    Lack of Transportation (Non-Medical): No  Physical Activity: Not on file  Stress: Not on file  Social Connections: Not on file  Intimate Partner Violence: Not At Risk (01/28/2023)   Humiliation, Afraid, Rape, and Kick questionnaire     Fear of Current or Ex-Partner: No    Emotionally Abused: No    Physically Abused: No    Sexually Abused: No    Allergies  Allergen Reactions   Codeine Nausea And Vomiting   Crestor [Rosuvastatin Calcium] Other (See Comments)    Leg aches   Erythromycin Rash    All Mycin drugs    Current Facility-Administered Medications  Medication Dose Route Frequency Provider Last Rate Last Admin   0.9 %  sodium chloride infusion  250 mL Intravenous PRN Aundra Dubin, MD  acetaminophen (TYLENOL) tablet 650 mg  650 mg Oral Q4H PRN Aundra Dubin, MD   650 mg at 01/29/23 4098   aspirin chewable tablet 81 mg  81 mg Oral Pre-Cath Clegg, Amy D, NP       aspirin chewable tablet 81 mg  81 mg Oral Daily Laurey Morale, MD   81 mg at 01/29/23 0806   atorvastatin (LIPITOR) tablet 80 mg  80 mg Oral Daily Aundra Dubin, MD   80 mg at 01/29/23 0804   Chlorhexidine Gluconate Cloth 2 % PADS 6 each  6 each Topical Daily Aundra Dubin, MD   6 each at 01/29/23 1026   cholecalciferol (VITAMIN D3) 25 MCG (1000 UNIT) tablet 1,000 Units  1,000 Units Oral q morning Aundra Dubin, MD   1,000 Units at 01/29/23 0804   digoxin (LANOXIN) tablet 0.125 mg  0.125 mg Oral Daily Laurey Morale, MD   0.125 mg at 01/29/23 0804   DULoxetine (CYMBALTA) DR capsule 60 mg  60 mg Oral Daily Aundra Dubin, MD   60 mg at 01/29/23 0804   enoxaparin (LOVENOX) injection 40 mg  40 mg Subcutaneous Q24H Aundra Dubin, MD   40 mg at 01/29/23 0806   fluticasone (FLONASE) 50 MCG/ACT nasal spray 1 spray  1 spray Each Nare Once per day on Monday Thursday Aundra Dubin, MD   1 spray at 01/29/23 1019   furosemide (LASIX) 200 mg in dextrose 5 % 100 mL (2 mg/mL) infusion  12 mg/hr Intravenous Continuous Laurey Morale, MD 6 mL/hr at 01/29/23 0242 12 mg/hr at 01/29/23 0242   insulin aspart (novoLOG) injection 0-9 Units  0-9 Units Subcutaneous TID WC Aundra Dubin, MD   1 Units at 01/29/23 0806   loratadine  (CLARITIN) tablet 10 mg  10 mg Oral Daily Aundra Dubin, MD   10 mg at 01/29/23 0804   melatonin tablet 3 mg  3 mg Oral QHS PRN Aundra Dubin, MD   3 mg at 01/29/23 0057   milrinone (PRIMACOR) 20 MG/100 ML (0.2 mg/mL) infusion  0.25 mcg/kg/min (Dosing Weight) Intravenous Continuous Laurey Morale, MD 6.12 mL/hr at 01/29/23 0753 0.25 mcg/kg/min at 01/29/23 0753   ondansetron (ZOFRAN) injection 4 mg  4 mg Intravenous Q6H PRN Aundra Dubin, MD   4 mg at 01/28/23 0742   pantoprazole (PROTONIX) EC tablet 40 mg  40 mg Oral Daily Aundra Dubin, MD   40 mg at 01/29/23 0804   sacubitril-valsartan (ENTRESTO) 49-51 mg per tablet  1 tablet Oral BID Laurey Morale, MD       sodium chloride flush (NS) 0.9 % injection 10-40 mL  10-40 mL Intracatheter Q12H Aundra Dubin, MD   10 mL at 01/29/23 1027   sodium chloride flush (NS) 0.9 % injection 10-40 mL  10-40 mL Intracatheter PRN Aundra Dubin, MD       sodium chloride flush (NS) 0.9 % injection 3 mL  3 mL Intravenous Q12H Aundra Dubin, MD   3 mL at 01/29/23 0816   sodium chloride flush (NS) 0.9 % injection 3 mL  3 mL Intravenous PRN Aundra Dubin, MD         Family History  Problem Relation Age of Onset   Lung cancer Mother    Heart disease Father    Asthma Sister    Asthma Sister    Colon cancer Sister    Rheum arthritis Sister  x3     Review of Systems:     Cardiac Review of Systems: Y or N  Chest Pain [    ]  Resting SOB [   ] Exertional SOB  [ y ]  Orthopnea [  ]   Pedal Edema [   ]    Palpitations [  ] Syncope  [  ]   Presyncope [   ]  General Review of Systems: [Y] = yes [  ]=no Constitional: recent weight change [  ]; anorexia [  ]; fatigue [place]  ]; nausea [  ]; night sweats [  ]; fever [  ]; or chills [  ];                                                                                                                                          Dental: poor dentition[  ]; Last Dentist visit: 6  months  Eye : blurred vision [  ]; diplopia [   ]; vision changes [  ];  Amaurosis fugax[  ]; Resp: cough [  ];  wheezing[  ];  hemoptysis[  ]; shortness of breath[ y ]; paroxysmal nocturnal dyspnea[  ]; dyspnea on exertion[y ]; or orthopnea[  ];  GI:  gallstones[  ], vomiting[  ];  dysphagia[  ]; melena[  ];  hematochezia [  ]; heartburn[  ];   Hx of  Colonoscopy[  ]; GU: kidney stones [  ]; hematuria[  ];   dysuria [  ];  nocturia[  ];  history of     obstruction [  ];                 Skin: rash, swelling[  ];, hair loss[  ];  peripheral edema[y  ];  or itching[  ]; Musculosketetal: myalgias[  ];  joint swelling[  ];  joint erythema[  ];  joint pain[  ];  back pain[  ];  Heme/Lymph: bruising[ y ];  bleeding[  ];  anemia[  ];  Neuro: TIA[  ];  headaches[  ];  stroke[  ];  vertigo[  ];  seizures[  ];   paresthesias[  ];  difficulty walking[  ];  Psych:depression[  ]; anxiety[  ];  Endocrine: diabetes[ y ];  thyroid dysfunction[  ];  Immunizations: Flu [  ]; Pneumococcal[  ];  Other:  Physical Exam: BP (!) 87/51 (BP Location: Left Arm)   Pulse 91   Temp (!) 97.5 F (36.4 C) (Oral)   Resp 19   Wt 83.6 kg   SpO2 98%   BMI 27.23 kg/m        Exam    General- alert and comfortable, mildly anxious    Neck- no JVD, no cervical adenopathy palpable, no carotid bruit   Lungs-dry rales bilaterally   Cor- regular rate and rhythm, no murmur , gallop  Abdomen- soft, non-tender   Extremities - warm, non-tender, minimal edema   Neuro- oriented, appropriate, no focal weakness    Diagnostic Studies & Laboratory data:     Recent Radiology Findings:   CT CHEST ABDOMEN PELVIS WO CONTRAST  Result Date: 01/28/2023 CLINICAL DATA:  LVAD preop evaluation. EXAM: CT CHEST, ABDOMEN AND PELVIS WITHOUT CONTRAST TECHNIQUE: Multidetector CT imaging of the chest, abdomen and pelvis was performed following the standard protocol without IV contrast. RADIATION DOSE REDUCTION: This exam was performed according  to the departmental dose-optimization program which includes automated exposure control, adjustment of the mA and/or kV according to patient size and/or use of iterative reconstruction technique. COMPARISON:  Chest CT 01/05/2022.  Abdominopelvic CT 07/31/2021 FINDINGS: CT CHEST FINDINGS Cardiovascular: Cardiomegaly. No pericardial effusion. Atherosclerosis of the thoracic aorta. No aortic aneurysm. Dilated main pulmonary artery at 3.4 cm. No pericardial effusion. Mediastinum/Nodes: Small hiatal hernia. Shotty and mildly enlarged mediastinal lymph nodes. This is similar to prior exam. Lower anterior paratracheal node measures 14 mm, previously 15 mm. There are prominent right supraclavicular nodes, chronic. Assessment for hilar adenopathy is limited on this unenhanced exam. No thyroid nodule. No axillary adenopathy. Lungs/Pleura: Small to moderate right and small left pleural effusion. Effusions have diminished from CT last year. Fluid tracks into the right minor fissure. Chronic interstitial lung disease with fibrosis and honeycombing. No convincing progression from prior exam. No dominant pulmonary mass. Trace retained mucus in the trachea. Musculoskeletal: Minimal bilateral gynecomastia. Thoracic spondylosis with anterior spurring. There are no acute or suspicious osseous abnormalities. CT ABDOMEN PELVIS FINDINGS Hepatobiliary: Unremarkable unenhanced appearance of the liver. No focal liver abnormality. Gallbladder physiologically distended, no calcified stone. No biliary dilatation. Pancreas: Mild parenchymal atrophy. No ductal dilatation or inflammation. Spleen: Normal in size without focal abnormality. Adrenals/Urinary Tract: Normal adrenal glands. Ovoid hyperdense lesion in the right renal sinus spans 2.3 cm, unchanged from prior exam, previously characterized as hyperdense cyst. The additional small renal lesions on prior not as well seen today in the absence of contrast. Postsurgical change in the anterior  lower left kidney. No hydronephrosis. Mild chronic bilateral perinephric edema. No renal calculi. Partially distended urinary bladder. There is a small chronic anterior bladder diverticula. No bladder wall thickening. Stomach/Bowel: Colonic diverticulosis without diverticulitis. Moderate volume of stool in the colon. No small bowel obstruction or inflammation. Small hiatal hernia. There is ingested material within the stomach. The appendix is normal. Vascular/Lymphatic: Aortic atherosclerosis. No aortic aneurysm. There is an enlarged pericaval node in the upper abdomen measuring 14 mm short axis series 3, image 60. This was not seen on prior exam. Enlarged lymph node in the gastrohepatic ligament measuring 16 mm series 3, image 62. This is also new. There additional prominent lymph nodes in the upper abdomen. No retroperitoneal or pelvic adenopathy. Reproductive: Surgical clips in the prostate. Penile prosthesis with pump on the right. Other: No ascites. Small amount of free fluid in the pelvis, minimally complex. No abdominal ascites. There is no free air. No abdominal wall hernia. Musculoskeletal: Mild lumbar facet hypertrophy. There are no acute or suspicious osseous abnormalities. IMPRESSION: 1. Small to moderate right and small left pleural effusions. 2. Chronic interstitial lung disease with fibrosis and honeycombing. No convincing progression from prior exam. 3. Cardiomegaly. Dilated main pulmonary artery suggesting pulmonary arterial hypertension. 4. Enlarged lymph nodes in the upper abdomen, new from prior exam, largest 16 mm short axis in the gastrohepatic ligament. These are nonspecific. Shotty mediastinal lymph nodes are chronic finding, likely related to known  interstitial lung disease. 5. Colonic diverticulosis without diverticulitis. 6. Small hiatal hernia. 7. Hyperdense cyst in the right kidney, previously characterized. This needs no further imaging follow-up. Aortic Atherosclerosis (ICD10-I70.0).  Electronically Signed   By: Narda Rutherford M.D.   On: 01/28/2023 18:39   DG Orthopantogram  Result Date: 01/28/2023 CLINICAL DATA:  Preoperative evaluation to rule out surgical contraindication. Preop LVAD. EXAM: ORTHOPANTOGRAM/PANORAMIC COMPARISON:  None Available. FINDINGS: Dental fillings of all upper teeth and the majority of the lower teeth. No gross dental caries. No evidence of periapical lucency. There are few missing lower teeth on the right. No erosive or bony destructive change. IMPRESSION: No gross dental caries or periapical lucencies. Multiple dental fillings. Electronically Signed   By: Narda Rutherford M.D.   On: 01/28/2023 18:18   CARDIAC CATHETERIZATION  Result Date: 01/28/2023 1. Low cardiac output. 2. Low PAPi 1.64 3. Elevated right and left heart filling pressures. 4. Moderate primarily pulmonary venous hypertension.   [place] EKG SITE RITE  Result Date: 01/28/2023 If Site Rite image not attached, placement could not be confirmed due to current cardiac rhythm.  DG Chest 2 View  Result Date: 01/27/2023 CLINICAL DATA:  Shortness of breath. The patient increases home Lasix dosage for the past 4 days with minimal results. EXAM: CHEST - 2 VIEW COMPARISON:  01/20/2023 01/09/2022.  Chest CT dated 01/05/2022 FINDINGS: Normal sized heart. Tortuous and partially calcified thoracic aorta. The hemidiaphragms are mildly flattened with diffuse peribronchial thickening. Stable diffuse prominence of the interstitial markings, remaining most pronounced at the lung bases. No Kerley lines or pleural fluid seen. Thoracic spine degenerative changes. IMPRESSION: 1. COPD with stable chronic interstitial lung disease. 2. No acute abnormality. 3. Aortic atherosclerosis. Electronically Signed   By: Beckie Salts M.D.   On: 01/27/2023 19:55      Recent Lab Findings: Lab Results  Component Value Date   WBC 9.4 01/27/2023   HGB 10.9 (L) 01/28/2023   HGB 10.9 (L) 01/28/2023   HCT 32.0 (L) 01/28/2023    HCT 32.0 (L) 01/28/2023   PLT 297 01/27/2023   GLUCOSE 162 (H) 01/29/2023   CHOL 97 01/29/2023   TRIG 76 01/29/2023   HDL 21 (L) 01/29/2023   LDLCALC 61 01/29/2023   ALT 126 (H) 01/28/2023   AST 98 (H) 01/28/2023   NA 137 01/29/2023   K 3.4 (L) 01/29/2023   CL 98 01/29/2023   CREATININE 1.37 (H) 01/29/2023   BUN 26 (H) 01/29/2023   CO2 26 01/29/2023   TSH 1.396 01/29/2023   INR 1.3 (H) 01/29/2023   HGBA1C 6.2 (H) 01/29/2023      Assessment / Plan:   Ischemic cardiomyopathy with severe LV dysfunction and mild-moderate RV dysfunction without significant valvular disease.  Minimal LV dilatation. Echocardiogram today shows LV thrombus-start IV heparin.  Advanced symptoms of heart failure which have been progressive despite guideline directed medical therapy, now admitted with low cardiac output with requirement for milrinone.  Significant history of smoking with pulmonary fibrosis and significant pulmonary pathology on CT scan  Patient appears to be an appropriate candidate for VAD mechanical support however I am concerned about his pulmonary function and will review the PFTs which have been ordered.

## 2023-01-29 NOTE — Plan of Care (Signed)

## 2023-01-29 NOTE — Progress Notes (Signed)
Initial Nutrition Assessment  DOCUMENTATION CODES:   Non-severe (moderate) malnutrition in context of chronic illness  INTERVENTION:  Encourage adequate PO intake  Ensure Enlive po BID, each supplement provides 350 kcal and 20 grams of protein. 30 ml ProSource Plus TID, each supplement provides 100 kcals and 15 grams protein.  MVI with minerals daily  NUTRITION DIAGNOSIS:   Moderate Malnutrition related to chronic illness (chronic HF) as evidenced by mild fat depletion, moderate muscle depletion, mild muscle depletion.  GOAL:   Patient will meet greater than or equal to 90% of their needs  MONITOR:   PO intake, Supplement acceptance, Labs, Weight trends, I & O's  REASON FOR ASSESSMENT:   Consult LVAD Eval  ASSESSMENT:   Pt admitted with SOB d/t acute on chronic systolic HF. PMH significant for CAD, chronic systolic HF (likely mixed ischemic and non-ischemic), T2DM, HTN, renal mass s/p nephrectomy, COPD and history of PE.   9/18: s/p RHC- elevated R and L heart filling pressures  Spoke with pt at bedside. Pt states that his appetite has changed significantly over time. He does not eat "junk" food like he used to as he has tried to manage his blood sugars via diet. He does still enjoy ice cream at times. His meat portions have decreased from 12-16 oz down to 6-8 oz. He reports eating more fruits and vegetables recently. On occasion when he eats out he may eat a fish sandwich from [employer]. He used to cook more often but over the last 6-8 months he has cooked less d/t dyspnea and lack of energy. Pt endorses having no appetite. He often only consumes 1 meal per day. Tries to limit his sodium as best as he can. He consumes about 4 (16.9 oz) bottles of Coke zero daily.   Meal completions: 9/18: 100% dinner 9/19: 100% breakfast  Pt states that on admission in September 2023, he weighed 209 lbs. At discharge 12 days later after diuresing, he weighed 182 lbs. Since that time he has  consistently weighed 178-183 lbs. During OP MD visit last week, pt reports weight was up to 190 lbs.   Pt's standing weight this morning documented to be 184.4 lbs (83.6 kg).   Medications: Vitamin D3, SSI 0-9 units TID, protonix, IV lasix  Labs: potassium 3.4, BUN 26, Cr 1.37, GFR 55, CBG's 122-158 x24 hours, HgBA1c 6.2%  UOP: 1.8L x24 hours I/O's: - since admit  NUTRITION - FOCUSED PHYSICAL EXAM:  Flowsheet Row Most Recent Value  Orbital Region No depletion  Upper Arm Region Severe depletion  Thoracic and Lumbar Region No depletion  Buccal Region Mild depletion  Temple Region Mild depletion  Clavicle Bone Region Mild depletion  Clavicle and Acromion Bone Region No depletion  Scapular Bone Region No depletion  Dorsal Hand Mild depletion  Patellar Region Moderate depletion  Anterior Thigh Region Moderate depletion  Posterior Calf Region Mild depletion  Edema (RD Assessment) Moderate  [BLE]  Hair Reviewed  Eyes Reviewed  Mouth Reviewed  Skin Reviewed  Nails Reviewed       Diet Order:   Diet Order             Diet 2 gram sodium Fluid consistency: Thin  Diet effective now                   EDUCATION NEEDS:   Education needs have been addressed  Skin:  Skin Assessment: Reviewed RN Assessment  Last BM:  9/17  Height:   Ht Readings from  Last 1 Encounters:  08/05/22 5\' 9"  (1.753 m)    Weight:   Wt Readings from Last 1 Encounters:  01/29/23 83.6 kg   Ideal Body Weight:  72.7 kg  BMI:  Body mass index is 27.23 kg/m.  Estimated Nutritional Needs:   Kcal:  2100-2300  Protein:  100-115g  Fluid:  >/=2L  Drusilla Kanner, RDN, LDN Clinical Nutrition

## 2023-01-29 NOTE — Progress Notes (Signed)
This chaplain responded to PMT NP-Shae's consult for creating/updating the Pt. Advance Directive.  The chaplain understands the Pt. has started conversations with the person he may choose to be HCPOA, but is not ready to document a choice. The chaplain will F/U on the Pt. AD on Friday.   The Pt. is sitting on the EOB throughout the visit. The chaplain listens reflectively as the Pt. openly and positively reflects on the amount of LVAD education he has received today. The Pt. expresses an understanding of quality of life with and without the LVAD and has no regrets on the life he has lived. The Pt. shares he has strong relationships with his friends as companionship may be needed.  This chaplain is available for F/U spiritual care as needed.  Chaplain Stephanie Acre [phone]

## 2023-01-29 NOTE — Progress Notes (Signed)
CSW met briefly at bedside with patient to discuss LVAD psychosocial and role of caregiver. Patient states that he lives alone and unsure about a caregiver. He states in the past his sister came from [place] but has not spoken with her and unclear if that will be an option this time. CSW stressed the need for a caregiver and the ongoing role for the life of the LVAD. Patient states he may hire someone to come at nights. CSW explained that the caregiver is needed 24/7 for 2 weeks and the ongoing need for dressing changes. Patient states he will think about it and discuss with his sister and friends. CSW will return tomorrow to further discuss and complete psychosocial assessment. Lasandra Beech, LCSW, CCSW-MCS [phone]

## 2023-01-29 NOTE — Progress Notes (Signed)
Peripherally Inserted Central Catheter Placement  The IV Nurse has discussed with the patient and/or persons authorized to consent for the patient, the purpose of this procedure and the potential benefits and risks involved with this procedure.  The benefits include less needle sticks, lab draws from the catheter, and the patient may be discharged home with the catheter. Risks include, but not limited to, infection, bleeding, blood clot (thrombus formation), and puncture of an artery; nerve damage and irregular heartbeat and possibility to perform a PICC exchange if needed/ordered by physician.  Alternatives to this procedure were also discussed.  Bard Power PICC patient education guide, fact sheet on infection prevention and patient information card has been provided to patient /or left at bedside.    PICC Placement Documentation  PICC Double Lumen 01/29/23 Right Basilic 38 cm 0 cm (Active)  Indication for Insertion or Continuance of Line Vasoactive infusions 01/29/23 0933  Exposed Catheter (cm) 0 cm 01/29/23 0933  Site Assessment Clean, Dry, Intact 01/29/23 0933  Lumen #1 Status Flushed;Blood return noted;Saline locked 01/29/23 0933  Lumen #2 Status Flushed;Blood return noted;Saline locked 01/29/23 0933  Dressing Type Transparent 01/29/23 0933  Dressing Status Antimicrobial disc in place 01/29/23 0933  Line Care Connections checked and tightened 01/29/23 0933  Line Adjustment (NICU/IV Team Only) No 01/29/23 0933  Dressing Intervention New dressing 01/29/23 0933  Dressing Change Due 02/05/23 01/29/23 0933       Audrie Gallus 01/29/2023, 9:35 AM

## 2023-01-29 NOTE — Consult Note (Signed)
Consultation Note Date: 01/29/2023   Patient Name: Phillip Rich  DOB: [birth year]  MRN: [id number]  Age / Sex: 72 y.o., male  PCP: Merlene Laughter, MD (Inactive) Referring Physician: Aundra Dubin, MD  Reason for Consultation: Establishing goals of care  HPI/Patient Profile: 72 y.o. male  with past medical history of CAD, chronic systolic HF (likely mixed ischemic and non-ischemic), T2DM, HTN, renal mass s/p nephrectomy, COPD and history of PE admitted on 01/27/2023 with shortness of breath.  Patient presented with symptoms of volume overload and NYHA functional class III symptoms. Has developed progressive lower extremity swelling, dyspnea on exertion/conversation, weight gain, poor appetite and abdominal bloating all consistent with volume overload.  Patient is being considered for LVAD.    Clinical Assessment and Goals of Care: I have reviewed medical records including EPIC notes, labs and imaging, received report from RN, assessed the patient and then met with patient  to discuss diagnosis prognosis, GOC, EOL wishes, disposition and options.  I introduced Palliative Medicine as specialized medical care for people living with serious illness. It focuses on providing relief from the symptoms and stress of a serious illness. The goal is to improve quality of life for both the patient and the family.  We discussed a brief life review of the patient.  Patient lives alone.  He has been divorced for over 20 years.  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  He has visited 8 countries in multiple cities throughout the [place].  He is a huge [employer] baseball fan.  He describes himself as a Education administrator" and he has a positive attitude.  He tells me he has lived a great life.  As far as functional and nutritional status he tells me of a decline for the past 2 years with no energy, no longer able to complete yard work he used to do.   We  discussed patient's current illness and what it means in the larger context of patient's on-going co-morbidities.  We discussed the potential of LVAD.  He tells me this has been explained to him in detail and he is currently overwhelmed with information.  He tells me he does understand to expect a hard road ahead.  He is currently working on arranging caregivers.  I attempted to elicit values and goals of care important to the patient.  He tells me what is most important to him is having more time to be alive.  He wants to be able to enjoy a nice meal and have good conversations with his friends.  He is hopeful to potentially travel some, nowhere in particular.  Phillip Rich tells me that quality of life is of utmost importance to him.  He would never want to be a burden on others and he would never want to live in a vegetative state.  Again he states he has lived a wonderful life and it is okay "if I go today".  He tells me he would never want things like permanent feeding tubes or to be dialysis dependent.  He tells me he would want his best friend Wendall Mola to serve as his healthcare power of attorney.  He is agreeable to completing this paperwork today.  He tells me Doylene Canard knows him well and could make good medical decisions for him.  I encouraged him to have further discussions with Doylene Canard about his wishes surrounding medical care.  Discussed with Mr. Malvin the importance of continued conversation with family and the medical  providers regarding overall plan of care and treatment options, ensuring decisions are within the context of the patient's values and GOCs.     Questions and concerns were addressed. The family was encouraged to call with questions or concerns.  Primary Decision Maker PATIENT  He would like to name his best friend Jessee Avers as HCPOA  SUMMARY OF RECOMMENDATIONS   - to complete HCPOA/living will - spiritual care aware - encouraged discussions with HCPOA -  patient has received lots of information about LVAD, feeling overwhelmed, but seems to be leaning towards accepting it if approved - Would never want long term feeding tube or HD - would not find this to be acceptable quality of life  Code Status/Advance Care Planning: Full code     Primary Diagnoses: Present on Admission:  Acute on chronic systolic (congestive) heart failure (HCC)   I have reviewed the medical record, interviewed the patient and family, and examined the patient. The following aspects are pertinent.  Past Medical History:  Diagnosis Date   Anxiety    BPH (benign prostatic hyperplasia)    Complication of anesthesia    Diabetes mellitus    DJD (degenerative joint disease)    GERD (gastroesophageal reflux disease)    HTN (hypertension)    Hyperlipidemia    ILD (interstitial lung disease) (HCC)    Left renal mass    Melanoma (HCC)    Mild sleep apnea    Sleep study pending   PONV (postoperative nausea and vomiting)    Social History   Socioeconomic History   Marital status: Single    Spouse name: Not on file   Number of children: 0   Years of education: Not on file   Highest education level: High school graduate  Occupational History   Occupation: retired  Tobacco Use   Smoking status: Former    Current packs/day: 0.00    Average packs/day: 1 pack/day for 50.0 years (50.0 ttl pk-yrs)    Types: Cigarettes    Start date: 10/17/1971    Quit date: 10/16/2021    Years since quitting: 1.2   Smokeless tobacco: Never  Vaping Use   Vaping status: Never Used  Substance and Sexual Activity   Alcohol use: Yes    Comment: rare occ.   Drug use: Yes    Types: Marijuana    Comment: 07/29/21 3 x week to help sleep   Sexual activity: Not on file  Other Topics Concern   Not on file  Social History Narrative   Lives alone.  Divorced.  Retired Engineer, technical sales.       Social Determinants of Health   Financial Resource Strain: Medium Risk (01/16/2022)   Overall  Financial Resource Strain (CARDIA)    Difficulty of Paying Living Expenses: Somewhat hard  Food Insecurity: No Food Insecurity (01/28/2023)   Hunger Vital Sign    Worried About Running Out of Food in the Last Year: Never true    Ran Out of Food in the Last Year: Never true  Transportation Needs: No Transportation Needs (01/28/2023)   PRAPARE - Administrator, Civil Service (Medical): No    Lack of Transportation (Non-Medical): No  Physical Activity: Not on file  Stress: Not on file  Social Connections: Not on file   Family History  Problem Relation Age of Onset   Lung cancer Mother    Heart disease Father    Asthma Sister    Asthma Sister    Colon cancer Sister  Rheum arthritis Sister        x3   Scheduled Meds:  (feeding supplement) PROSource Plus  30 mL Oral TID BM   aspirin  81 mg Oral Pre-Cath   aspirin  81 mg Oral Daily   atorvastatin  80 mg Oral Daily   Chlorhexidine Gluconate Cloth  6 each Topical Daily   cholecalciferol  1,000 Units Oral q morning   digoxin  0.125 mg Oral Daily   DULoxetine  60 mg Oral Daily   feeding supplement  237 mL Oral BID BM   fluticasone  1 spray Each Nare Once per day on Monday Thursday   insulin aspart  0-9 Units Subcutaneous TID WC   loratadine  10 mg Oral Daily   multivitamin with minerals  1 tablet Oral Daily   pantoprazole  40 mg Oral Daily   potassium chloride  40 mEq Oral BID   sacubitril-valsartan  1 tablet Oral BID   sodium chloride flush  10-40 mL Intracatheter Q12H   sodium chloride flush  3 mL Intravenous Q12H   Continuous Infusions:  sodium chloride     furosemide (LASIX) 200 mg in dextrose 5 % 100 mL (2 mg/mL) infusion 12 mg/hr (01/29/23 1217)   milrinone 0.25 mcg/kg/min (01/29/23 0753)   PRN Meds:.sodium chloride, acetaminophen, melatonin, ondansetron (ZOFRAN) IV, sodium chloride flush, sodium chloride flush Allergies  Allergen Reactions   Codeine Nausea And Vomiting   Crestor [Rosuvastatin Calcium] Other  (See Comments)    Leg aches   Erythromycin Rash    All Mycin drugs   Review of Systems  Constitutional:  Positive for activity change.  Respiratory:  Positive for shortness of breath.   Cardiovascular:  Positive for leg swelling.    Physical Exam Constitutional:      General: He is not in acute distress.    Appearance: He is not ill-appearing.  Pulmonary:     Effort: Pulmonary effort is normal.  Musculoskeletal:     Right lower leg: Edema present.     Left lower leg: Edema present.  Skin:    General: Skin is warm and dry.  Neurological:     Mental Status: He is alert and oriented to person, place, and time.  Psychiatric:        Mood and Affect: Mood normal.        Behavior: Behavior normal.     Vital Signs: BP (!) 87/51 (BP Location: Left Arm)   Pulse 91   Temp (!) 97.5 F (36.4 C) (Oral)   Resp 19   Wt 83.6 kg   SpO2 98%   BMI 27.23 kg/m  Pain Scale: 0-10   Pain Score: 0-No pain   SpO2: SpO2: 98 % O2 Device:SpO2: 98 % O2 Flow Rate: .   IO: Intake/output summary:  Intake/Output Summary (Last 24 hours) at 01/29/2023 1422 Last data filed at 01/29/2023 1217 Gross per 24 hour  Intake 786.29 ml  Output 1400 ml  Net -613.71 ml    LBM: Last BM Date : 01/27/23 Baseline Weight: Weight: 83.6 kg Most recent weight: Weight: 83.6 kg     Palliative Assessment/Data: PPS 60%     *Please note that this is a verbal dictation therefore any spelling or grammatical errors are due to the "Dragon Medical One" system interpretation.   Time Total: 80 minutes Time spent includes: Detailed review of medical records (labs, imaging, vital signs), medically appropriate exam, discussion with treatment team, counseling and educating patient, family and/or staff, documenting clinical information, medication  management and coordination of care.    Gerlean Ren, DNP, AGNP-C Palliative Medicine Team [phone] Pager: [phone]

## 2023-01-29 NOTE — TOC Initial Note (Signed)
Transition of Care [facility]) - Initial/Assessment Note    Patient Details  Name: Phillip Rich MRN: [id number] Date of Birth: February 02, 1951  Transition of Care [facility]) CM/SW Contact:    Nicanor Bake Phone Number: [phone] 01/29/2023, 4:10 PM  Clinical Narrative:   HF CSW met with pt at the bedside. CSW built rapport with pt. Pt stated that he lives at home alone.  Pt stated that he has history with HH services back in 2012/2013 when he had a knee replacement. Pt stated that he would be interested in [facility] services in the future if needed. Pt stated that he uses a cane, walker, wheelchair, shower chair, and bedside commode. Pt stated that his support sytem consist of his ex wife and maybe a few clse friends. CSW explained that we would wait to see what the recommendation is and provide a list of agencies for pts reference.   TOC will continue following.               Expected Discharge Plan: Home/Self Care Barriers to Discharge: Continued Medical Work up   Patient Goals and CMS Choice Patient states their goals for this hospitalization and ongoing recovery are:: return home          Expected Discharge Plan and Services       Living arrangements for the past 2 months: Single Family Home                                      Prior Living Arrangements/Services Living arrangements for the past 2 months: Single Family Home Lives with:: Self, Pets Patient language and need for interpreter reviewed:: Yes Do you feel safe going back to the place where you live?: Yes      Need for Family Participation in Patient Care: No (Comment) Care giver support system in place?: No (comment)   Criminal Activity/Legal Involvement Pertinent to Current Situation/Hospitalization: No - Comment as needed  Activities of Daily Living Home Assistive Devices/Equipment: None, Shower chair without back ADL Screening (condition at time of admission) Patient's cognitive ability adequate to  safely complete daily activities?: Yes Is the patient deaf or have difficulty hearing?: No Does the patient have difficulty seeing, even when wearing glasses/contacts?: No Does the patient have difficulty concentrating, remembering, or making decisions?: No Patient able to express need for assistance with ADLs?: Yes Does the patient have difficulty dressing or bathing?: No Independently performs ADLs?: Yes (appropriate for developmental age) Does the patient have difficulty walking or climbing stairs?: No Weakness of Legs: None Weakness of Arms/Hands: None  Permission Sought/Granted                  Emotional Assessment Appearance:: Appears stated age Attitude/Demeanor/Rapport: Engaged Affect (typically observed): Appropriate Orientation: : Oriented to Self, Oriented to Place, Oriented to  Time, Oriented to Situation   Psych Involvement: No (comment)  Admission diagnosis:  Acute systolic congestive heart failure (HCC) [I50.21] Acute on chronic systolic (congestive) heart failure (HCC) [I50.23] Patient Active Problem List   Diagnosis Date Noted   Malnutrition of moderate degree 01/29/2023   Acute on chronic systolic (congestive) heart failure (HCC) 01/28/2023   Cardiomyopathy (HCC)mixed 10/03/2022   Chronic systolic CHF (congestive heart failure) (HCC) 10/03/2022   Class 1 obesity 01/21/2022   Coronary artery disease 01/17/2022   Acute pulmonary embolism (HCC) 01/15/2022   Acute systolic (congestive) heart failure (HCC) 01/09/2022  DM2 (diabetes mellitus, type 2) (HCC) 01/09/2022   HTN (hypertension) 01/09/2022   Acute renal failure (HCC) 01/09/2022   ILD (interstitial lung disease) (HCC) 12/19/2010   Tobacco abuse 12/19/2010   GERD (gastroesophageal reflux disease) 12/19/2010   Cough 10/27/2010   Anxiety 10/27/2010   SOB (shortness of breath) 10/27/2010   Lymph node enlargement 10/27/2010   PCP:  Merlene Laughter, MD (Inactive) Pharmacy:   [facility] [address], [place] - [address] [place] [address] Phone: [phone] Fax: [phone]  [facility] [place] - [place], [place] - [address] [place] [address] Phone: [phone] Fax: [phone]  [place] [facility] [address] [address] Phone: [phone] Fax: [phone]     Social Determinants of Health (SDOH) Social History: SDOH Screenings   Food Insecurity: No Food Insecurity (01/28/2023)  Housing: Low Risk  (01/28/2023)  Transportation Needs: No Transportation Needs (01/28/2023)  Utilities: Not At Risk (01/28/2023)  Alcohol Screen: Low Risk  (01/16/2022)  Financial Resource Strain: Medium Risk (01/16/2022)  Tobacco Use: Medium Risk (01/27/2023)   SDOH Interventions:     Readmission Risk Interventions     No data to display

## 2023-01-30 ENCOUNTER — Inpatient Hospital Stay (HOSPITAL_COMMUNITY): Payer: Medicare Other

## 2023-01-30 DIAGNOSIS — J849 Interstitial pulmonary disease, unspecified: Secondary | ICD-10-CM

## 2023-01-30 DIAGNOSIS — I5023 Acute on chronic systolic (congestive) heart failure: Secondary | ICD-10-CM | POA: Diagnosis not present

## 2023-01-30 DIAGNOSIS — Z515 Encounter for palliative care: Secondary | ICD-10-CM | POA: Diagnosis not present

## 2023-01-30 DIAGNOSIS — Z7189 Other specified counseling: Secondary | ICD-10-CM | POA: Diagnosis not present

## 2023-01-30 LAB — PULMONARY FUNCTION TEST
DL/VA % pred: 45 %
DL/VA: 1.83 ml/min/mmHg/L
DLCO unc % pred: 33 %
DLCO unc: 8.58 ml/min/mmHg
FEF 25-75 Post: 1.34 L/sec
FEF 25-75 Pre: 2.46 L/sec
FEF2575-%Change-Post: -45 %
FEF2575-%Pred-Post: 56 %
FEF2575-%Pred-Pre: 103 %
FEV1-%Change-Post: -36 %
FEV1-%Pred-Post: 54 %
FEV1-%Pred-Pre: 85 %
FEV1-Post: 1.72 L
FEV1-Pre: 2.71 L
FEV1FVC-%Change-Post: -35 %
FEV1FVC-%Pred-Pre: 105 %
FEV6-%Change-Post: 0 %
FEV6-%Pred-Post: 83 %
FEV6-%Pred-Pre: 84 %
FEV6-Post: 3.44 L
FEV6-Pre: 3.46 L
FEV6FVC-%Change-Post: 0 %
FEV6FVC-%Pred-Post: 105 %
FEV6FVC-%Pred-Pre: 105 %
FVC-%Change-Post: 0 %
FVC-%Pred-Post: 79 %
FVC-%Pred-Pre: 80 %
FVC-Post: 3.46 L
FVC-Pre: 3.49 L
Post FEV1/FVC ratio: 50 %
Post FEV6/FVC ratio: 99 %
Pre FEV1/FVC ratio: 78 %
Pre FEV6/FVC Ratio: 99 %
RV % pred: 66 %
RV: 1.65 L
TLC % pred: 73 %
TLC: 5.19 L

## 2023-01-30 LAB — LUPUS ANTICOAGULANT PANEL
DRVVT: 49.3 s — ABNORMAL HIGH (ref 0.0–47.0)
PTT Lupus Anticoagulant: 52.5 s — ABNORMAL HIGH (ref 0.0–43.5)

## 2023-01-30 LAB — GLUCOSE, CAPILLARY
Glucose-Capillary: 128 mg/dL — ABNORMAL HIGH (ref 70–99)
Glucose-Capillary: 158 mg/dL — ABNORMAL HIGH (ref 70–99)
Glucose-Capillary: 166 mg/dL — ABNORMAL HIGH (ref 70–99)
Glucose-Capillary: 94 mg/dL (ref 70–99)

## 2023-01-30 LAB — DRVVT CONFIRM: dRVVT Confirm: 1 ratio (ref 0.8–1.2)

## 2023-01-30 LAB — BASIC METABOLIC PANEL
Anion gap: 11 (ref 5–15)
BUN: 29 mg/dL — ABNORMAL HIGH (ref 8–23)
CO2: 30 mmol/L (ref 22–32)
Calcium: 9 mg/dL (ref 8.9–10.3)
Chloride: 96 mmol/L — ABNORMAL LOW (ref 98–111)
Creatinine, Ser: 1.42 mg/dL — ABNORMAL HIGH (ref 0.61–1.24)
GFR, Estimated: 53 mL/min — ABNORMAL LOW (ref >60)
Glucose, Bld: 122 mg/dL — ABNORMAL HIGH (ref 70–99)
Potassium: 4.1 mmol/L (ref 3.5–5.1)
Sodium: 137 mmol/L (ref 135–145)

## 2023-01-30 LAB — CBC
HCT: 27.8 % — ABNORMAL LOW (ref 39.0–52.0)
Hemoglobin: 8.9 g/dL — ABNORMAL LOW (ref 13.0–17.0)
MCH: 29.8 pg (ref 26.0–34.0)
MCHC: 32 g/dL (ref 30.0–36.0)
MCV: 93 fL (ref 80.0–100.0)
Platelets: 257 10*3/uL (ref 150–400)
RBC: 2.99 MIL/uL — ABNORMAL LOW (ref 4.22–5.81)
RDW: 15.7 % — ABNORMAL HIGH (ref 11.5–15.5)
WBC: 9.4 10*3/uL (ref 4.0–10.5)
nRBC: 0 % (ref 0.0–0.2)

## 2023-01-30 LAB — COOXEMETRY PANEL
Carboxyhemoglobin: 1.4 % (ref 0.5–1.5)
Methemoglobin: <0.7 % (ref 0.0–1.5)
O2 Saturation: 53.7 %
Total hemoglobin: 10.6 g/dL — ABNORMAL LOW (ref 12.0–16.0)

## 2023-01-30 LAB — DRVVT MIX: dRVVT Mix: 41.2 s — ABNORMAL HIGH (ref 0.0–40.4)

## 2023-01-30 LAB — PTT-LA MIX: PTT-LA Mix: 47 s — ABNORMAL HIGH (ref 0.0–40.5)

## 2023-01-30 LAB — HEPARIN LEVEL (UNFRACTIONATED): Heparin Unfractionated: 0.46 IU/mL (ref 0.30–0.70)

## 2023-01-30 LAB — HEPATITIS B SURFACE ANTIBODY, QUANTITATIVE: Hep B S AB Quant (Post): <3.5 m[IU]/mL — ABNORMAL LOW

## 2023-01-30 LAB — MAGNESIUM: Magnesium: 1.7 mg/dL (ref 1.7–2.4)

## 2023-01-30 LAB — HEXAGONAL PHASE PHOSPHOLIPID: Hexagonal Phase Phospholipid: 7 s (ref 0–11)

## 2023-01-30 MED ORDER — ALBUTEROL SULFATE (2.5 MG/3ML) 0.083% IN NEBU
2.5000 mg | INHALATION_SOLUTION | Freq: Once | RESPIRATORY_TRACT | Status: AC
  Administered 2023-01-30: 2.5 mg via RESPIRATORY_TRACT

## 2023-01-30 MED ORDER — MAGNESIUM SULFATE 4 GM/100ML IV SOLN
4.0000 g | Freq: Once | INTRAVENOUS | Status: AC
  Administered 2023-01-30: 4 g via INTRAVENOUS
  Filled 2023-01-30: qty 100

## 2023-01-30 MED ORDER — EMPAGLIFLOZIN 10 MG PO TABS
10.0000 mg | ORAL_TABLET | Freq: Every day | ORAL | Status: DC
  Administered 2023-01-30 – 2023-02-04 (×6): 10 mg via ORAL
  Filled 2023-01-30 (×6): qty 1

## 2023-01-30 MED ORDER — TORSEMIDE 20 MG PO TABS
40.0000 mg | ORAL_TABLET | Freq: Every day | ORAL | Status: DC
  Administered 2023-01-31 – 2023-02-01 (×2): 40 mg via ORAL
  Filled 2023-01-30 (×2): qty 2

## 2023-01-30 MED ORDER — SACUBITRIL-VALSARTAN 24-26 MG PO TABS
1.0000 | ORAL_TABLET | Freq: Two times a day (BID) | ORAL | Status: DC
  Administered 2023-01-30 – 2023-02-04 (×10): 1 via ORAL
  Filled 2023-01-30 (×10): qty 1

## 2023-01-30 NOTE — TOC Progression Note (Signed)
Transition of Care [facility]) - Progression Note    Patient Details  Name: Phillip Rich MRN: [id number] Date of Birth: 07-15-1950  Transition of Care [facility]) CM/SW Contact  Nicanor Bake Phone Number: [phone] 01/30/2023, 11:56 AM  Clinical Narrative: HF CSW met with the pt at bedside. CSW provided pt with the [facility] list that he requested. Pt stated that his sister in [place] may be coming up to take care of him. However, he is still considering HH services if needed once he dc.   TOC will continue following.       Expected Discharge Plan: Home/Self Care Barriers to Discharge: Continued Medical Work up  Expected Discharge Plan and Services       Living arrangements for the past 2 months: Single Family Home                                       Social Determinants of Health (SDOH) Interventions SDOH Screenings   Food Insecurity: No Food Insecurity (01/28/2023)  Housing: Low Risk  (01/28/2023)  Transportation Needs: No Transportation Needs (01/28/2023)  Utilities: Not At Risk (01/28/2023)  Alcohol Screen: Low Risk  (01/16/2022)  Financial Resource Strain: Medium Risk (01/16/2022)  Tobacco Use: Medium Risk (01/27/2023)    Readmission Risk Interventions     No data to display

## 2023-01-30 NOTE — Plan of Care (Signed)

## 2023-01-30 NOTE — Progress Notes (Signed)
ANTICOAGULATION CONSULT NOTE - Initial Consult  Pharmacy Consult for heparin Indication: new LV thrombus  Allergies  Allergen Reactions   Codeine Nausea And Vomiting   Crestor [Rosuvastatin Calcium] Other (See Comments)    Leg aches   Erythromycin Rash    All Mycin drugs    Patient Measurements: Weight: 83.6 kg (184 lb 6.4 oz)   Vital Signs: Temp: 97.8 F (36.6 C) (09/20 0439) Temp Source: Oral (09/20 0439) BP: 110/74 (09/20 0439) Pulse Rate: 98 (09/20 0439)  Labs: Recent Labs    01/27/23 1835 01/28/23 0031 01/28/23 0349 01/28/23 1306 01/29/23 0259 01/29/23 1100 01/30/23 0443  HGB 11.1*  --   --  10.9*  10.9*  --  9.8* 8.9*  HCT 35.2*  --   --  32.0*  32.0*  --  29.3* 27.8*  PLT 297  --   --   --   --  214 257  APTT  --   --   --   --  41*  --   --   LABPROT  --   --   --   --  16.5*  --   --   INR  --   --   --   --  1.3*  --   --   HEPARINUNFRC  --   --   --   --   --   --  0.46  CREATININE 1.32*  --  1.22  --  1.37*  --  1.42*  TROPONINIHS 90* 101*  --   --   --   --   --     Estimated Creatinine Clearance: 47 mL/min (A) (by C-G formula based on SCr of 1.42 mg/dL (H)).   Medical History: Past Medical History:  Diagnosis Date   Anxiety    BPH (benign prostatic hyperplasia)    Complication of anesthesia    Diabetes mellitus    DJD (degenerative joint disease)    GERD (gastroesophageal reflux disease)    HTN (hypertension)    Hyperlipidemia    ILD (interstitial lung disease) (HCC)    Left renal mass    Melanoma (HCC)    Mild sleep apnea    Sleep study pending   PONV (postoperative nausea and vomiting)       Assessment: 71yom with HF EF 25% admitted with volume overload and low cardiac output after RHC and started on milrinone. New LV thrombus found on ECHO  W/u for possible LVAD - will dose with heparin for anticoagulation CBC stable no bleeding noted .  HL 0.46 - therapeutic   Goal of Therapy:  Heparin level 0.3-0.7 units/ml Monitor  platelets by anticoagulation protocol: Yes   Plan:  Continue heparin at current rate : 1100 units/hr Daily HL , CBC  F/u LVAD workup   Calton Dach, PharmD, BCCCP Clinical Pharmacist 01/30/2023 5:39 AM

## 2023-01-30 NOTE — Consult Note (Signed)
[facility]  [facility] Inpatient Consult   01/30/2023  Phillip Rich 12-Dec-1950 [id number]  [facility] [[facility]]  Accountable Care Organization [ACO] Patient: [name] Medicare  Primary Care Provider: Merlene Laughter, MD (Inactive) <-showing, provider office is listed to [name] team.  Patient initially off the unit.   [facility] Liaison met patient at bedside at [name].  Patient endorses PCP at [name], is [name] since [name] retired.   The patient was screened for readmission hospitalization with noted  risk score for unplanned readmission risk medium with 1 hospital admissions in 6 months.  The patient was assessed for potential [facility]) Care Management service needs for post hospital transition for care coordination. Review of patient's electronic medical record reveals patient is from home alone. Patient endorses he has no needs at this time, anticipate ongoing medical management. States if he needed transportation he would use [facility]. Patient had an incoming phone call.  Plan: [name] Liaison will continue to follow progress and disposition to assess for post hospital community care coordination/management needs.  Referral request for community care coordination: pending disposition needs.   Community Care Management/Population Health does not replace or interfere with any arrangements made by the Inpatient Transition of Care team.   For questions contact:   [name], RN, BSN, CCM [place]  [facility], [facility] Liaison Direct Dial: [phone] or secure chat Website: [email or web address]

## 2023-01-30 NOTE — Progress Notes (Addendum)
POP      Full           Yes      Yes                                 +---------+---------------+---------+-----------+----------+--------------+ PTV      Full                                                        +---------+---------------+---------+-----------+----------+--------------+ PERO     Full                                                         +---------+---------------+---------+-----------+----------+--------------+     Summary: BILATERAL: - No evidence of deep vein thrombosis seen in the lower extremities, bilaterally. -No evidence of popliteal cyst, bilaterally.   *See table(s) above for measurements and observations. Electronically signed by Gerarda Fraction on 01/29/2023 at 4:16:47 PM.    Final    VAS US DOPPLER PRE VAD  Result Date: 01/29/2023 PERIOPERATIVE VASCULAR EVALUATION Patient Name:  Phillip Rich  Date of Exam:   01/29/2023 Medical Rec #: [id number]         Accession #:    [id number] Date of Birth: [birth year]         Patient Gender: M Patient Age:   72 years Exam Location:  [facility] Procedure:      VAS US DOPPLER PRE VAD Referring Phys: Marca Ancona --------------------------------------------------------------------------------  Indications:      Pre operative VAD. Risk Factors:     Hypertension, hyperlipidemia, Diabetes, past history of                   smoking. Other Factors:    CHF, cardiomyopathy. Limitations:      Patient hd Echo with Definity prior to vascular studies Comparison Study: No prior study on file Performing Technologist: Sherren Kerns RVS  Examination Guidelines: A complete evaluation includes B-mode imaging, spectral Doppler, color Doppler, and power Doppler as needed of all accessible portions of each vessel. Bilateral testing is considered an integral part of a complete examination. Limited examinations for reoccurring indications may be performed as noted.  Right Carotid Findings: +----------+--------+--------+--------+------------------+--------+           PSV cm/sEDV cm/sStenosisDescribe          Comments +----------+--------+--------+--------+------------------+--------+ CCA Prox  80      12              heterogenous               +----------+--------+--------+--------+------------------+--------+ CCA Distal58      10              heterogenous                +----------+--------+--------+--------+------------------+--------+ ICA Prox  63      17              calcific and focal         +----------+--------+--------+--------+------------------+--------+ ICA Mid   40      14  POP      Full           Yes      Yes                                 +---------+---------------+---------+-----------+----------+--------------+ PTV      Full                                                        +---------+---------------+---------+-----------+----------+--------------+ PERO     Full                                                         +---------+---------------+---------+-----------+----------+--------------+     Summary: BILATERAL: - No evidence of deep vein thrombosis seen in the lower extremities, bilaterally. -No evidence of popliteal cyst, bilaterally.   *See table(s) above for measurements and observations. Electronically signed by Gerarda Fraction on 01/29/2023 at 4:16:47 PM.    Final    VAS US DOPPLER PRE VAD  Result Date: 01/29/2023 PERIOPERATIVE VASCULAR EVALUATION Patient Name:  Phillip Rich  Date of Exam:   01/29/2023 Medical Rec #: [id number]         Accession #:    [id number] Date of Birth: [birth year]         Patient Gender: M Patient Age:   72 years Exam Location:  [facility] Procedure:      VAS US DOPPLER PRE VAD Referring Phys: Marca Ancona --------------------------------------------------------------------------------  Indications:      Pre operative VAD. Risk Factors:     Hypertension, hyperlipidemia, Diabetes, past history of                   smoking. Other Factors:    CHF, cardiomyopathy. Limitations:      Patient hd Echo with Definity prior to vascular studies Comparison Study: No prior study on file Performing Technologist: Sherren Kerns RVS  Examination Guidelines: A complete evaluation includes B-mode imaging, spectral Doppler, color Doppler, and power Doppler as needed of all accessible portions of each vessel. Bilateral testing is considered an integral part of a complete examination. Limited examinations for reoccurring indications may be performed as noted.  Right Carotid Findings: +----------+--------+--------+--------+------------------+--------+           PSV cm/sEDV cm/sStenosisDescribe          Comments +----------+--------+--------+--------+------------------+--------+ CCA Prox  80      12              heterogenous               +----------+--------+--------+--------+------------------+--------+ CCA Distal58      10              heterogenous                +----------+--------+--------+--------+------------------+--------+ ICA Prox  63      17              calcific and focal         +----------+--------+--------+--------+------------------+--------+ ICA Mid   40      14  POP      Full           Yes      Yes                                 +---------+---------------+---------+-----------+----------+--------------+ PTV      Full                                                        +---------+---------------+---------+-----------+----------+--------------+ PERO     Full                                                         +---------+---------------+---------+-----------+----------+--------------+     Summary: BILATERAL: - No evidence of deep vein thrombosis seen in the lower extremities, bilaterally. -No evidence of popliteal cyst, bilaterally.   *See table(s) above for measurements and observations. Electronically signed by Gerarda Fraction on 01/29/2023 at 4:16:47 PM.    Final    VAS US DOPPLER PRE VAD  Result Date: 01/29/2023 PERIOPERATIVE VASCULAR EVALUATION Patient Name:  Phillip Rich  Date of Exam:   01/29/2023 Medical Rec #: [id number]         Accession #:    [id number] Date of Birth: [birth year]         Patient Gender: M Patient Age:   72 years Exam Location:  [facility] Procedure:      VAS US DOPPLER PRE VAD Referring Phys: Marca Ancona --------------------------------------------------------------------------------  Indications:      Pre operative VAD. Risk Factors:     Hypertension, hyperlipidemia, Diabetes, past history of                   smoking. Other Factors:    CHF, cardiomyopathy. Limitations:      Patient hd Echo with Definity prior to vascular studies Comparison Study: No prior study on file Performing Technologist: Sherren Kerns RVS  Examination Guidelines: A complete evaluation includes B-mode imaging, spectral Doppler, color Doppler, and power Doppler as needed of all accessible portions of each vessel. Bilateral testing is considered an integral part of a complete examination. Limited examinations for reoccurring indications may be performed as noted.  Right Carotid Findings: +----------+--------+--------+--------+------------------+--------+           PSV cm/sEDV cm/sStenosisDescribe          Comments +----------+--------+--------+--------+------------------+--------+ CCA Prox  80      12              heterogenous               +----------+--------+--------+--------+------------------+--------+ CCA Distal58      10              heterogenous                +----------+--------+--------+--------+------------------+--------+ ICA Prox  63      17              calcific and focal         +----------+--------+--------+--------+------------------+--------+ ICA Mid   40      14  POP      Full           Yes      Yes                                 +---------+---------------+---------+-----------+----------+--------------+ PTV      Full                                                        +---------+---------------+---------+-----------+----------+--------------+ PERO     Full                                                         +---------+---------------+---------+-----------+----------+--------------+     Summary: BILATERAL: - No evidence of deep vein thrombosis seen in the lower extremities, bilaterally. -No evidence of popliteal cyst, bilaterally.   *See table(s) above for measurements and observations. Electronically signed by Gerarda Fraction on 01/29/2023 at 4:16:47 PM.    Final    VAS US DOPPLER PRE VAD  Result Date: 01/29/2023 PERIOPERATIVE VASCULAR EVALUATION Patient Name:  Phillip Rich  Date of Exam:   01/29/2023 Medical Rec #: [id number]         Accession #:    [id number] Date of Birth: [birth year]         Patient Gender: M Patient Age:   72 years Exam Location:  [facility] Procedure:      VAS US DOPPLER PRE VAD Referring Phys: Marca Ancona --------------------------------------------------------------------------------  Indications:      Pre operative VAD. Risk Factors:     Hypertension, hyperlipidemia, Diabetes, past history of                   smoking. Other Factors:    CHF, cardiomyopathy. Limitations:      Patient hd Echo with Definity prior to vascular studies Comparison Study: No prior study on file Performing Technologist: Sherren Kerns RVS  Examination Guidelines: A complete evaluation includes B-mode imaging, spectral Doppler, color Doppler, and power Doppler as needed of all accessible portions of each vessel. Bilateral testing is considered an integral part of a complete examination. Limited examinations for reoccurring indications may be performed as noted.  Right Carotid Findings: +----------+--------+--------+--------+------------------+--------+           PSV cm/sEDV cm/sStenosisDescribe          Comments +----------+--------+--------+--------+------------------+--------+ CCA Prox  80      12              heterogenous               +----------+--------+--------+--------+------------------+--------+ CCA Distal58      10              heterogenous                +----------+--------+--------+--------+------------------+--------+ ICA Prox  63      17              calcific and focal         +----------+--------+--------+--------+------------------+--------+ ICA Mid   40      14  1.00 multiphasic                +---------+------------------+-----+-----------+---------------+ DP       117               1.03 multiphasic                +---------+------------------+-----+-----------+---------------+ Great Toe101                    Normal                     +---------+------------------+-----+-----------+---------------+ +---------+------------------+-----+-----------+-------+ Left     Lt Pressure (mmHg)IndexWaveform   Comment +---------+------------------+-----+-----------+-------+ Brachial 114                    triphasic          +---------+------------------+-----+-----------+-------+ PTA      110               0.96 multiphasic        +---------+------------------+-----+-----------+-------+ DP       123               1.08 multiphasic        +---------+------------------+-----+-----------+-------+ Andreas Ohm                     Normal             +---------+------------------+-----+-----------+-------+ +-------+---------------+----------------+ ABI/TBIToday's ABI/TBIPrevious ABI/TBI  +-------+---------------+----------------+ Right  1.03/0.89                       +-------+---------------+----------------+ Left   1.08/0.75                       +-------+---------------+----------------+  Summary: Right Carotid: Velocities in the right ICA are consistent with a 1-39% stenosis. Left Carotid: Velocities in the left ICA are consistent with a 1-39% stenosis. Vertebrals: Bilateral vertebral arteries demonstrate antegrade flow.  *See table(s) above for measurements and observations. Right ABI: Resting right ankle-brachial index is within normal range. The right toe-brachial index is normal. Left ABI: Resting left ankle-brachial index is within normal range. The left toe-brachial index is normal.  Electronically signed by Gerarda Fraction on 01/29/2023 at 4:15:32 PM.    Final    ECHOCARDIOGRAM COMPLETE  Result Date: 01/29/2023    ECHOCARDIOGRAM REPORT   Patient Name:   Phillip Rich Date of Exam: 01/29/2023 Medical Rec #:  [id number]        Height:       69.0 in Accession #:    [id number]       Weight:       184.4 lb Date of Birth:  [birth year]        BSA:          1.996 m Patient Age:    71 years         BP:           87/51 mmHg Patient Gender: M                HR:           92 bpm. Exam Location:  Inpatient Procedure: 2D Echo, Color Doppler, Cardiac Doppler and Intracardiac            Opacification Agent REPORT CONTAINS CRITICAL RESULT Indications:    CHF, Pre-VAD  History:        Patient has prior history of Echocardiogram examinations, most  POP      Full           Yes      Yes                                 +---------+---------------+---------+-----------+----------+--------------+ PTV      Full                                                        +---------+---------------+---------+-----------+----------+--------------+ PERO     Full                                                         +---------+---------------+---------+-----------+----------+--------------+     Summary: BILATERAL: - No evidence of deep vein thrombosis seen in the lower extremities, bilaterally. -No evidence of popliteal cyst, bilaterally.   *See table(s) above for measurements and observations. Electronically signed by Gerarda Fraction on 01/29/2023 at 4:16:47 PM.    Final    VAS US DOPPLER PRE VAD  Result Date: 01/29/2023 PERIOPERATIVE VASCULAR EVALUATION Patient Name:  Phillip Rich  Date of Exam:   01/29/2023 Medical Rec #: [id number]         Accession #:    [id number] Date of Birth: [birth year]         Patient Gender: M Patient Age:   72 years Exam Location:  [facility] Procedure:      VAS US DOPPLER PRE VAD Referring Phys: Marca Ancona --------------------------------------------------------------------------------  Indications:      Pre operative VAD. Risk Factors:     Hypertension, hyperlipidemia, Diabetes, past history of                   smoking. Other Factors:    CHF, cardiomyopathy. Limitations:      Patient hd Echo with Definity prior to vascular studies Comparison Study: No prior study on file Performing Technologist: Sherren Kerns RVS  Examination Guidelines: A complete evaluation includes B-mode imaging, spectral Doppler, color Doppler, and power Doppler as needed of all accessible portions of each vessel. Bilateral testing is considered an integral part of a complete examination. Limited examinations for reoccurring indications may be performed as noted.  Right Carotid Findings: +----------+--------+--------+--------+------------------+--------+           PSV cm/sEDV cm/sStenosisDescribe          Comments +----------+--------+--------+--------+------------------+--------+ CCA Prox  80      12              heterogenous               +----------+--------+--------+--------+------------------+--------+ CCA Distal58      10              heterogenous                +----------+--------+--------+--------+------------------+--------+ ICA Prox  63      17              calcific and focal         +----------+--------+--------+--------+------------------+--------+ ICA Mid   40      14  1.00 multiphasic                +---------+------------------+-----+-----------+---------------+ DP       117               1.03 multiphasic                +---------+------------------+-----+-----------+---------------+ Great Toe101                    Normal                     +---------+------------------+-----+-----------+---------------+ +---------+------------------+-----+-----------+-------+ Left     Lt Pressure (mmHg)IndexWaveform   Comment +---------+------------------+-----+-----------+-------+ Brachial 114                    triphasic          +---------+------------------+-----+-----------+-------+ PTA      110               0.96 multiphasic        +---------+------------------+-----+-----------+-------+ DP       123               1.08 multiphasic        +---------+------------------+-----+-----------+-------+ Andreas Ohm                     Normal             +---------+------------------+-----+-----------+-------+ +-------+---------------+----------------+ ABI/TBIToday's ABI/TBIPrevious ABI/TBI  +-------+---------------+----------------+ Right  1.03/0.89                       +-------+---------------+----------------+ Left   1.08/0.75                       +-------+---------------+----------------+  Summary: Right Carotid: Velocities in the right ICA are consistent with a 1-39% stenosis. Left Carotid: Velocities in the left ICA are consistent with a 1-39% stenosis. Vertebrals: Bilateral vertebral arteries demonstrate antegrade flow.  *See table(s) above for measurements and observations. Right ABI: Resting right ankle-brachial index is within normal range. The right toe-brachial index is normal. Left ABI: Resting left ankle-brachial index is within normal range. The left toe-brachial index is normal.  Electronically signed by Gerarda Fraction on 01/29/2023 at 4:15:32 PM.    Final    ECHOCARDIOGRAM COMPLETE  Result Date: 01/29/2023    ECHOCARDIOGRAM REPORT   Patient Name:   Phillip Rich Date of Exam: 01/29/2023 Medical Rec #:  [id number]        Height:       69.0 in Accession #:    [id number]       Weight:       184.4 lb Date of Birth:  [birth year]        BSA:          1.996 m Patient Age:    71 years         BP:           87/51 mmHg Patient Gender: M                HR:           92 bpm. Exam Location:  Inpatient Procedure: 2D Echo, Color Doppler, Cardiac Doppler and Intracardiac            Opacification Agent REPORT CONTAINS CRITICAL RESULT Indications:    CHF, Pre-VAD  History:        Patient has prior history of Echocardiogram examinations, most  POP      Full           Yes      Yes                                 +---------+---------------+---------+-----------+----------+--------------+ PTV      Full                                                        +---------+---------------+---------+-----------+----------+--------------+ PERO     Full                                                         +---------+---------------+---------+-----------+----------+--------------+     Summary: BILATERAL: - No evidence of deep vein thrombosis seen in the lower extremities, bilaterally. -No evidence of popliteal cyst, bilaterally.   *See table(s) above for measurements and observations. Electronically signed by Gerarda Fraction on 01/29/2023 at 4:16:47 PM.    Final    VAS US DOPPLER PRE VAD  Result Date: 01/29/2023 PERIOPERATIVE VASCULAR EVALUATION Patient Name:  Phillip Rich  Date of Exam:   01/29/2023 Medical Rec #: [id number]         Accession #:    [id number] Date of Birth: [birth year]         Patient Gender: M Patient Age:   72 years Exam Location:  [facility] Procedure:      VAS US DOPPLER PRE VAD Referring Phys: Marca Ancona --------------------------------------------------------------------------------  Indications:      Pre operative VAD. Risk Factors:     Hypertension, hyperlipidemia, Diabetes, past history of                   smoking. Other Factors:    CHF, cardiomyopathy. Limitations:      Patient hd Echo with Definity prior to vascular studies Comparison Study: No prior study on file Performing Technologist: Sherren Kerns RVS  Examination Guidelines: A complete evaluation includes B-mode imaging, spectral Doppler, color Doppler, and power Doppler as needed of all accessible portions of each vessel. Bilateral testing is considered an integral part of a complete examination. Limited examinations for reoccurring indications may be performed as noted.  Right Carotid Findings: +----------+--------+--------+--------+------------------+--------+           PSV cm/sEDV cm/sStenosisDescribe          Comments +----------+--------+--------+--------+------------------+--------+ CCA Prox  80      12              heterogenous               +----------+--------+--------+--------+------------------+--------+ CCA Distal58      10              heterogenous                +----------+--------+--------+--------+------------------+--------+ ICA Prox  63      17              calcific and focal         +----------+--------+--------+--------+------------------+--------+ ICA Mid   40      14  POP      Full           Yes      Yes                                 +---------+---------------+---------+-----------+----------+--------------+ PTV      Full                                                        +---------+---------------+---------+-----------+----------+--------------+ PERO     Full                                                         +---------+---------------+---------+-----------+----------+--------------+     Summary: BILATERAL: - No evidence of deep vein thrombosis seen in the lower extremities, bilaterally. -No evidence of popliteal cyst, bilaterally.   *See table(s) above for measurements and observations. Electronically signed by Gerarda Fraction on 01/29/2023 at 4:16:47 PM.    Final    VAS US DOPPLER PRE VAD  Result Date: 01/29/2023 PERIOPERATIVE VASCULAR EVALUATION Patient Name:  Phillip Rich  Date of Exam:   01/29/2023 Medical Rec #: [id number]         Accession #:    [id number] Date of Birth: [birth year]         Patient Gender: M Patient Age:   72 years Exam Location:  [facility] Procedure:      VAS US DOPPLER PRE VAD Referring Phys: Marca Ancona --------------------------------------------------------------------------------  Indications:      Pre operative VAD. Risk Factors:     Hypertension, hyperlipidemia, Diabetes, past history of                   smoking. Other Factors:    CHF, cardiomyopathy. Limitations:      Patient hd Echo with Definity prior to vascular studies Comparison Study: No prior study on file Performing Technologist: Sherren Kerns RVS  Examination Guidelines: A complete evaluation includes B-mode imaging, spectral Doppler, color Doppler, and power Doppler as needed of all accessible portions of each vessel. Bilateral testing is considered an integral part of a complete examination. Limited examinations for reoccurring indications may be performed as noted.  Right Carotid Findings: +----------+--------+--------+--------+------------------+--------+           PSV cm/sEDV cm/sStenosisDescribe          Comments +----------+--------+--------+--------+------------------+--------+ CCA Prox  80      12              heterogenous               +----------+--------+--------+--------+------------------+--------+ CCA Distal58      10              heterogenous                +----------+--------+--------+--------+------------------+--------+ ICA Prox  63      17              calcific and focal         +----------+--------+--------+--------+------------------+--------+ ICA Mid   40      14  POP      Full           Yes      Yes                                 +---------+---------------+---------+-----------+----------+--------------+ PTV      Full                                                        +---------+---------------+---------+-----------+----------+--------------+ PERO     Full                                                         +---------+---------------+---------+-----------+----------+--------------+     Summary: BILATERAL: - No evidence of deep vein thrombosis seen in the lower extremities, bilaterally. -No evidence of popliteal cyst, bilaterally.   *See table(s) above for measurements and observations. Electronically signed by Gerarda Fraction on 01/29/2023 at 4:16:47 PM.    Final    VAS US DOPPLER PRE VAD  Result Date: 01/29/2023 PERIOPERATIVE VASCULAR EVALUATION Patient Name:  Phillip Rich  Date of Exam:   01/29/2023 Medical Rec #: [id number]         Accession #:    [id number] Date of Birth: [birth year]         Patient Gender: M Patient Age:   72 years Exam Location:  [facility] Procedure:      VAS US DOPPLER PRE VAD Referring Phys: Marca Ancona --------------------------------------------------------------------------------  Indications:      Pre operative VAD. Risk Factors:     Hypertension, hyperlipidemia, Diabetes, past history of                   smoking. Other Factors:    CHF, cardiomyopathy. Limitations:      Patient hd Echo with Definity prior to vascular studies Comparison Study: No prior study on file Performing Technologist: Sherren Kerns RVS  Examination Guidelines: A complete evaluation includes B-mode imaging, spectral Doppler, color Doppler, and power Doppler as needed of all accessible portions of each vessel. Bilateral testing is considered an integral part of a complete examination. Limited examinations for reoccurring indications may be performed as noted.  Right Carotid Findings: +----------+--------+--------+--------+------------------+--------+           PSV cm/sEDV cm/sStenosisDescribe          Comments +----------+--------+--------+--------+------------------+--------+ CCA Prox  80      12              heterogenous               +----------+--------+--------+--------+------------------+--------+ CCA Distal58      10              heterogenous                +----------+--------+--------+--------+------------------+--------+ ICA Prox  63      17              calcific and focal         +----------+--------+--------+--------+------------------+--------+ ICA Mid   40      14

## 2023-01-30 NOTE — Progress Notes (Signed)
Daily Progress Note   Patient Name: Phillip Rich       Date: 01/30/2023 DOB: 1950/07/14  Age: 72 y.o. MRN#: [id number] Attending Physician: Aundra Dubin, MD Primary Care Physician: Merlene Laughter, MD (Inactive) Admit Date: 01/27/2023  Reason for Consultation/Follow-up: Establishing goals of care  Subjective: Feels well, had PFTs this morning and feels he did well, no complaints  Length of Stay: 2  Current Medications: Scheduled Meds:   (feeding supplement) PROSource Plus  30 mL Oral TID BM   aspirin  81 mg Oral Daily   atorvastatin  80 mg Oral Daily   Chlorhexidine Gluconate Cloth  6 each Topical Daily   cholecalciferol  1,000 Units Oral q morning   digoxin  0.125 mg Oral Daily   DULoxetine  60 mg Oral Daily   empagliflozin  10 mg Oral Daily   feeding supplement  237 mL Oral BID BM   fluticasone  1 spray Each Nare Once per day on Monday Thursday   insulin aspart  0-9 Units Subcutaneous TID WC   loratadine  10 mg Oral Daily   multivitamin with minerals  1 tablet Oral Daily   pantoprazole  40 mg Oral Daily   sacubitril-valsartan  1 tablet Oral BID   sodium chloride flush  10-40 mL Intracatheter Q12H   sodium chloride flush  3 mL Intravenous Q12H   spironolactone  12.5 mg Oral Daily   [START ON 01/31/2023] torsemide  40 mg Oral Daily    Continuous Infusions:  sodium chloride     heparin 1,100 Units/hr (01/30/23 1130)   milrinone 0.375 mcg/kg/min (01/30/23 1118)    PRN Meds: sodium chloride, acetaminophen, melatonin, ondansetron (ZOFRAN) IV, sodium chloride flush, sodium chloride flush  Physical Exam Constitutional:      General: He is not in acute distress. Pulmonary:     Effort: Pulmonary effort is normal.  Skin:    General: Skin is warm and dry.  Neurological:      Mental Status: He is alert and oriented to person, place, and time.  Psychiatric:        Mood and Affect: Mood normal.        Behavior: Behavior normal.             Vital Signs: BP 110/75 (BP Location: Left Arm)   Pulse 100   Temp 97.8 F (36.6 C) (Oral)   Resp 18   Wt 82 kg   SpO2 96%   BMI 26.70 kg/m  SpO2: SpO2: 96 % O2 Device: O2 Device: Room Air O2 Flow Rate:    Intake/output summary:  Intake/Output Summary (Last 24 hours) at 01/30/2023 1544 Last data filed at 01/30/2023 1302 Gross per 24 hour  Intake 1293.01 ml  Output 2325 ml  Net -1031.99 ml   LBM: Last BM Date : 01/27/23 Baseline Weight: Weight: 83.6 kg Most recent weight: Weight: 82 kg       Palliative Assessment/Data: PPS 60%      Patient Active Problem List   Diagnosis Date Noted   Malnutrition of moderate degree 01/29/2023   Acute on chronic systolic (congestive) heart failure (HCC) 01/28/2023   Cardiomyopathy (HCC)mixed 10/03/2022   Chronic systolic CHF (congestive heart failure) (  HCC) 10/03/2022   Class 1 obesity 01/21/2022   Coronary artery disease 01/17/2022   Acute pulmonary embolism (HCC) 01/15/2022   Acute systolic (congestive) heart failure (HCC) 01/09/2022   DM2 (diabetes mellitus, type 2) (HCC) 01/09/2022   HTN (hypertension) 01/09/2022   Acute renal failure (HCC) 01/09/2022   ILD (interstitial lung disease) (HCC) 12/19/2010   Tobacco abuse 12/19/2010   GERD (gastroesophageal reflux disease) 12/19/2010   Cough 10/27/2010   Anxiety 10/27/2010   SOB (shortness of breath) 10/27/2010   Lymph node enlargement 10/27/2010    Palliative Care Assessment & Plan   HPI: 72 y.o. male  with past medical history of CAD, chronic systolic HF (likely mixed ischemic and non-ischemic), T2DM, HTN, renal mass s/p nephrectomy, COPD and history of PE admitted on 01/27/2023 with shortness of breath.  Patient presented with symptoms of volume overload and NYHA functional class III symptoms. Has developed  progressive lower extremity swelling, dyspnea on exertion/conversation, weight gain, poor appetite and abdominal bloating all consistent with volume overload.  Patient is being considered for LVAD.    Assessment: Follow up today with [name] - he is feeling pleased as he feels he did well this morning on PFTs.  He remains interested in LVAD if offered.  He is hopeful to go home soon - we reviewed some barriers to that. Reviewed current continuous infusions he remains on.  He asks that I add his sisters to his demographics so that they may receive medical updates. He continues to state he would like his friend Jessee Avers to serve as HCPOA if he were unable to make decisions for himself. No other questions or concerns at this point. Awaiting HCPOA doc completion.   Recommendations/Plan: Pending HCPOA/living will completion - encouraging conversations with HCPOA Would never want long term feeding tube or HD; focused on quality of life Open to LVAD, awaiting more discussion with AHF team  Goals of Care and Additional Recommendations: Limitations on Scope of Treatment: No Hemodialysis and No Tracheostomy  Code Status: Full code  Care plan was discussed with patient  Thank you for allowing the Palliative Medicine Team to assist in the care of this patient.   Total Time 30 minutes Prolonged Time Billed  no   Time spent includes: Detailed review of medical records (labs, imaging, vital signs), medically appropriate exam, discussion with treatment team, counseling and educating patient, family and/or staff, documenting clinical information, medication management and coordination of care.     *Please note that this is a verbal dictation therefore any spelling or grammatical errors are due to the "Dragon [employer] One" system interpretation.  Gerlean Ren, DNP, [facility] Palliative Medicine Team Team Phone # [phone]  Pager [phone]

## 2023-01-30 NOTE — Consult Note (Signed)
NAME:  Phillip Rich, MRN:  [id number], DOB:  Sep 03, 1950, LOS: 2 ADMISSION DATE:  01/27/2023, CONSULTATION DATE:  9/20 REFERRING MD:  Shirlee Latch, CHIEF COMPLAINT:  abnormal CT chest    History of Present Illness:  This is a 72 year old male who was admitted 9/18 w/ cc: worsening shortness of breath, wt gain, dec appetite in spite of increased outpt diuresis. Had known h/o ICM prior EF 25-30% admitted for acute on chronic HF w/ concern for volume overload.  Seen by adv HF team. RHC completed 9/18: RA mean 17, RV 57/21, PA 58/30, mean 39 PCWP mean 28, Oxygen saturations: PA 42%, Cardiac Output (Fick) 3.37  Cardiac Index (Fick) 1.66 , PVR 3.4 WU PAPi 1.64  Diuresis continued. Started on Milrinone. PICC placed. F/u echo 9/19 showed further EF depression: 20-25% mild RV dysxn, mod MR, TR and LV thrombus. Started on IV heparin.  He was seen in consult by cardiac surgery who felt could potentially be candidate for VAD but wanted Pulm eval given h/o ILD. Last seen in our clinic in 2012 by Dr Marchelle Gearing. His note talked about autoimmune workup and f/u imaging but at time pt also still smoking. From records no further pulm f/u since then although did have serial CT chest. In 2014 he had on-going GG changes but also that CT was notable for new honey comb changes. Has had several interval CTs showing on-going chronic changes and intermittent acute changes c/w pulmonary edema and pleural effusions.  We have been asked to see re: his ILD   Baseline pulm Has had exertional SOB since sept 2023 when admitted for HF. When he left was able to walk about 1/4 mile before fatigue would be limiting factor. Noted increased SOB to point where he started pacing activity around Nov 2023, and these symptoms progressed to point where getting dressed made him SOB around July this year.  -Stopped smoking sept 2023, has smoked intermittently since but rarely -No pets. Has cat he feeds but is outdoors.  -most of house is  hardwood -no concerns about dust or mold.  -no birds -does have reflux. If misses more than 2 doses of prilosec symptoms return -denies difficulty swallowing -still has baseline orthopnea and cough (typically improve as it has this time w/ diuresis).  Retired from [employer] pilot Still travels to [place]     Pertinent  Medical History  ICM w/ HFrEF (July 24 25-30%) w/ LV dilatation but no RV dysfxn Diabetes type II w/ diabetic neuropathy and polyneuropathy  Exertional dyspnea  ILD (last seen by MR 2012)  2012 autoimmune eval: tCK 68, Scleroderma AB IgG: 2 (neg), sjogrens syndrome B antibody: < 1 (neg), sjogrens syndrome A antibody: 2 (neg), RF <10 (neg),  Cyclic citrullin peptide <2 (neg), sed rate 50, ds DNA ab 2 (neg), ANA (neg) Chronic cough  obesity Former smoker (50 pk/yr. Stopped in 2023) Prior partial left nephrectomy for benign tumor  NASH BPH GERD HTN, HL Melanoma  Post-op N&V  Significant Hospital Events: Including procedures, antibiotic start and stop dates in addition to other pertinent events   9/17 admitted  9/18 right heart cath. CT chest chronic interstitial changes w/ GG changes as well as honey combing. There was CM, bilateral effusions some medidastinal LAN, no clear progression of disease since 2014   Interim History / Subjective:  Feels much better   Objective   Blood pressure 110/75, pulse 100, temperature 97.8 F (36.6 C), temperature source Oral, resp. rate 18, weight 82 kg, SpO2  96%. CVP:  [3 mmHg-14 mmHg] 3 mmHg      Intake/Output Summary (Last 24 hours) at 01/30/2023 1344 Last data filed at 01/30/2023 1302 Gross per 24 hour  Intake 1560.01 ml  Output 2325 ml  Net -764.99 ml   Filed Weights   01/29/23 0511 01/30/23 0607  Weight: 83.6 kg 82 kg    Examination: General: 72 year old male sitting up in bed no distress HENT: NCAT no JVD MMM Lungs: some wheezing and basilar rales. No accessory use. Able to speak full sentences. Went from recumbent to sitting  up at side of bed w/out dyspnea  Cardiovascular: RRR Abdomen: soft Extremities: + LE edema brisk CR Neuro: oriented and w/out focal def  GU: voids   Resolved Hospital Problem list     Assessment & Plan:   Acute on chronic dyspnea Pulmonary edema  ILD (diagnosed 2012 & stable since 2014) HFrEF Acute heart failure LV thrombus OSA GERD Hiatal hernia HTN HLD DM     Acute on Chronic dyspnea  Multifactorial: acutely due to pulmonary edema but this is superimposed on H/o ILD  It looks like his ILD has been  fairly stable disease since 2014. As to etiology not clear.  -his autoimmune w/u was neg  -he has had recurrent episodes of edema (pretty much chronic issue) -does have reflux, hiatal hernia and had aspiration in past after narcotics post-op -has arthritis in hands/shoulders/knees but RF neg. Some of his changes are c/w UIP pattern.  -all this being said it seems like the majority of his symptom burden arises from his cardiac disease.   Plan/rec Check walking oximetry Cont to rx GERD It does not seem going forward w/ OLBX makes sense as lung disease is stable and heart limiting factor for him.  Dr Celine Mans to follow    Best Practice (right click and "Reselect all SmartList Selections" daily)   Per primary   Labs   CBC: Recent Labs  Lab 01/27/23 1835 01/28/23 1306 01/29/23 1100 01/30/23 0443  WBC 9.4  --  8.5 9.4  NEUTROABS  --   --  5.2  --   HGB 11.1* 10.9*  10.9* 9.8* 8.9*  HCT 35.2* 32.0*  32.0* 29.3* 27.8*  MCV 97.5  --  93.0 93.0  PLT 297  --  214 257    Basic Metabolic Panel: Recent Labs  Lab 01/27/23 1835 01/28/23 0349 01/28/23 1306 01/29/23 0259 01/30/23 0443  NA 135 133* 137  137 137 137  K 4.1 3.7 3.9  3.9 3.4* 4.1  CL 96* 101  --  98 96*  CO2 22 21*  --  26 30  GLUCOSE 168* 145*  --  162* 122*  BUN 23 23  --  26* 29*  CREATININE 1.32* 1.22  --  1.37* 1.42*  CALCIUM 9.0 8.6*  --  8.6* 9.0  MG  --  1.7  --   --  1.7    GFR: Estimated Creatinine Clearance: 47 mL/min (A) (by C-G formula based on SCr of 1.42 mg/dL (H)). Recent Labs  Lab 01/27/23 1835 01/28/23 0911 01/29/23 1100 01/30/23 0443  WBC 9.4  --  8.5 9.4  LATICACIDVEN  --  2.1*  --   --     Liver Function Tests: Recent Labs  Lab 01/28/23 0349  AST 98*  ALT 126*  ALKPHOS 191*  BILITOT 1.3*  PROT 6.9  ALBUMIN 2.7*   No results for input(s): "LIPASE", "AMYLASE" in the last 168 hours. No results for  input(s): "AMMONIA" in the last 168 hours.  ABG    Component Value Date/Time   PHART 7.388 01/15/2022 1054   PCO2ART 48.3 (H) 01/15/2022 1054   PO2ART 68 (L) 01/15/2022 1054   HCO3 26.6 01/28/2023 1306   HCO3 26.6 01/28/2023 1306   TCO2 28 01/28/2023 1306   TCO2 28 01/28/2023 1306   O2SAT 53.7 01/30/2023 0443     Coagulation Profile: Recent Labs  Lab 01/29/23 0259  INR 1.3*    Cardiac Enzymes: No results for input(s): "CKTOTAL", "CKMB", "CKMBINDEX", "TROPONINI" in the last 168 hours.  HbA1C: Hgb A1c MFr Bld  Date/Time Value Ref Range Status  01/29/2023 02:59 AM 6.2 (H) 4.8 - 5.6 % Final    Comment:    (NOTE) Pre diabetes:          5.7%-6.4%  Diabetes:              >6.4%  Glycemic control for   <7.0% adults with diabetes   01/09/2022 09:39 PM 8.3 (H) 4.8 - 5.6 % Final    Comment:    (NOTE) Pre diabetes:          5.7%-6.4%  Diabetes:              >6.4%  Glycemic control for   <7.0% adults with diabetes     CBG: Recent Labs  Lab 01/29/23 1327 01/29/23 1616 01/29/23 2130 01/30/23 0601 01/30/23 1122  GLUCAP 119* 138* 126* 128* 158*    Review of Systems:   Review of Systems  Constitutional:  Positive for malaise/fatigue. Negative for fever and weight loss.  HENT: Negative.    Eyes: Negative.   Respiratory:  Positive for cough and shortness of breath.   Cardiovascular:  Positive for orthopnea, leg swelling and PND.  Gastrointestinal:  Positive for heartburn.  Genitourinary: Negative.    Musculoskeletal: Negative.   Skin: Negative.   Neurological: Negative.   Endo/Heme/Allergies:  Bruises/bleeds easily.  Psychiatric/Behavioral: Negative.       Past Medical History:  He,  has a past medical history of Anxiety, BPH (benign prostatic hyperplasia), Complication of anesthesia, Diabetes mellitus, DJD (degenerative joint disease), GERD (gastroesophageal reflux disease), HTN (hypertension), Hyperlipidemia, ILD (interstitial lung disease) (HCC), Left renal mass, Melanoma (HCC), Mild sleep apnea, and PONV (postoperative nausea and vomiting).   Surgical History:   Past Surgical History:  Procedure Laterality Date   COLONOSCOPY WITH PROPOFOL N/A 04/03/2014   Procedure: COLONOSCOPY WITH PROPOFOL;  Surgeon: Charolett Bumpers, MD;  Location: [facility];  Service: Endoscopy;  Laterality: N/A;   KNEE ARTHROSCOPY Right    x2 right, x5 left   LASIK     MELANOMA EXCISION Left    NEPHRECTOMY Left 2022   partial   PROSTATE ABLATION     RIGHT HEART CATH N/A 08/05/2022   Procedure: RIGHT HEART CATH;  Surgeon: Dorthula Nettles, DO;  Location: [facility] INVASIVE CV LAB;  Service: Cardiovascular;  Laterality: N/A;   RIGHT HEART CATH N/A 01/28/2023   Procedure: RIGHT HEART CATH;  Surgeon: Laurey Morale, MD;  Location[facility] INVASIVE CV LAB;  Service: Cardiovascular;  Laterality: N/A;   RIGHT/LEFT HEART CATH AND CORONARY ANGIOGRAPHY N/A 01/15/2022   Procedure: RIGHT/LEFT HEART CATH AND CORONARY ANGIOGRAPHY;  Surgeon: Iran Ouch, MD;  Location: [facility] INVASIVE CV LAB;  Service: Cardiovascular;  Laterality: N/A;   TOTAL KNEE ARTHROPLASTY     left   VASECTOMY       Social History:   reports that he quit smoking about 15  months ago. His smoking use included cigarettes. He started smoking about 51 years ago. He has a 50 pack-year smoking history. He has never used smokeless tobacco. He reports current alcohol use. He reports current drug use. Drug: Marijuana.   Family History:  His family history  includes Asthma in his sister and sister; Colon cancer in his sister; Heart disease in his father; Lung cancer in his mother; Rheum arthritis in his sister.   Allergies Allergies  Allergen Reactions   Codeine Nausea And Vomiting   Crestor [Rosuvastatin Calcium] Other (See Comments)    Leg aches   Erythromycin Rash    All Mycin drugs     Home Medications  Prior to Admission medications   Medication Sig Start Date End Date Taking? Authorizing Provider  albuterol (VENTOLIN HFA) 108 (90 Base) MCG/ACT inhaler Inhale 1-2 puffs into the lungs every 6 (six) hours as needed for wheezing or shortness of breath. 01/05/22  Yes Elayne Snare K, DO  aspirin EC 81 MG tablet Take 81 mg by mouth daily. Swallow whole.   Yes [provider]  atorvastatin (LIPITOR) 80 MG tablet Take 1 tablet (80 mg total) by mouth daily. 09/09/22 01/28/24 Yes Sabharwal, [name], DO  carvedilol (COREG) 6.25 MG tablet Take 1 tablet (6.25 mg total) by mouth 2 (two) times daily with a meal. 09/09/22  Yes Sabharwal, [name], DO  Cholecalciferol (VITAMIN D PO) Take 1,000 Units by mouth every morning.   Yes [provider]  DULoxetine (CYMBALTA) 60 MG capsule Take 60 mg by mouth daily. 05/28/21  Yes [provider]  empagliflozin (JARDIANCE) 10 MG TABS tablet Take 1 tablet (10 mg total) by mouth daily before breakfast. 09/25/22  Yes Sabharwal, [name], DO  fluticasone (FLONASE) 50 MCG/ACT nasal spray Place 1 spray into both nostrils 2 (two) times a week.   Yes [provider]  furosemide (LASIX) 20 MG tablet Take 1 tablet (20 mg total) by mouth as needed for fluid or edema. Greater than 3lb in 24 hours or 5 lb in a week 09/09/22  Yes Sabharwal, [name], DO  levocetirizine (XYZAL) 5 MG tablet Take 5 mg by mouth daily.   Yes [provider]  losartan (COZAAR) 25 MG tablet Take 25 mg by mouth daily. 09/09/22  Yes [provider]  Melatonin 10 MG TABS Take 10 mg by mouth at bedtime as needed  (Sleep).   Yes [provider]  metFORMIN (GLUCOPHAGE) 1000 MG tablet Take 1,000 mg by mouth 2 (two) times daily.  03/06/10  Yes [provider]  omeprazole (PRILOSEC) 20 MG capsule Take 1 tablet by mouth every morning.  08/06/10  Yes [provider]  sacubitril-valsartan (ENTRESTO) 97-103 MG Take 1 tablet by mouth 2 (two) times daily. 11/25/22  Yes Sabharwal, [name], DO  Semaglutide,0.25 or 0.5MG /DOS, (OZEMPIC, 0.25 OR 0.5 MG/DOSE,) 2 MG/3ML SOPN Inject 0.5 mg into the skin once a week.   Yes [provider]  vitamin B-12 (CYANOCOBALAMIN) 100 MCG tablet Take 100 mcg by mouth daily.   Yes [provider]  zolpidem (AMBIEN) 10 MG tablet Take 10 mg by mouth at bedtime as needed for sleep.   Yes [provider]  insulin lispro (HUMALOG KWIKPEN) 100 UNIT/ML KwikPen Inject 12 Units into the skin 2 (two) times daily. Patient not taking: Reported on 01/28/2023    [provider]     Critical care time: NA

## 2023-01-30 NOTE — Progress Notes (Signed)
CSW attempted to visit patient at bedside to further discuss VAD psychosocial assessment although patient still having testing and not available at this time. Lasandra Beech, LCSW, CCSW-MCS [phone]

## 2023-01-30 NOTE — Progress Notes (Signed)
This chaplain is present for F/U spiritual care in the setting of creating the Pt. Advance Directive.   The chaplain answered the Pt. questions about the purpose of an AD and defined the differences in HCPOA and Durable POA. The chaplain understands the Pt. has chosen his friend Wendall Mola to be his healthcare agent. The Pt. prefers to wait until Monday to document HCPOA with the support of [place] by phone.  This chaplain is available for f/U spiritual care as needed.  Chaplain Stephanie Acre [phone]

## 2023-01-31 DIAGNOSIS — I5023 Acute on chronic systolic (congestive) heart failure: Secondary | ICD-10-CM | POA: Diagnosis not present

## 2023-01-31 LAB — CBC
HCT: 27.9 % — ABNORMAL LOW (ref 39.0–52.0)
Hemoglobin: 8.7 g/dL — ABNORMAL LOW (ref 13.0–17.0)
MCH: 28.8 pg (ref 26.0–34.0)
MCHC: 31.2 g/dL (ref 30.0–36.0)
MCV: 92.4 fL (ref 80.0–100.0)
Platelets: 224 10*3/uL (ref 150–400)
RBC: 3.02 MIL/uL — ABNORMAL LOW (ref 4.22–5.81)
RDW: 15.7 % — ABNORMAL HIGH (ref 11.5–15.5)
WBC: 8.1 10*3/uL (ref 4.0–10.5)
nRBC: 0 % (ref 0.0–0.2)

## 2023-01-31 LAB — GLUCOSE, CAPILLARY
Glucose-Capillary: 120 mg/dL — ABNORMAL HIGH (ref 70–99)
Glucose-Capillary: 165 mg/dL — ABNORMAL HIGH (ref 70–99)
Glucose-Capillary: 168 mg/dL — ABNORMAL HIGH (ref 70–99)
Glucose-Capillary: 147 mg/dL — ABNORMAL HIGH (ref 70–99)

## 2023-01-31 LAB — MAGNESIUM: Magnesium: 2.5 mg/dL — ABNORMAL HIGH (ref 1.7–2.4)

## 2023-01-31 LAB — COOXEMETRY PANEL
Carboxyhemoglobin: 2.5 % — ABNORMAL HIGH (ref 0.5–1.5)
Methemoglobin: 0.7 % (ref 0.0–1.5)
O2 Saturation: 62.5 %
Total hemoglobin: 11.6 g/dL — ABNORMAL LOW (ref 12.0–16.0)

## 2023-01-31 LAB — BASIC METABOLIC PANEL
Anion gap: 7 (ref 5–15)
BUN: 32 mg/dL — ABNORMAL HIGH (ref 8–23)
CO2: 30 mmol/L (ref 22–32)
Calcium: 8.8 mg/dL — ABNORMAL LOW (ref 8.9–10.3)
Chloride: 97 mmol/L — ABNORMAL LOW (ref 98–111)
Creatinine, Ser: 1.31 mg/dL — ABNORMAL HIGH (ref 0.61–1.24)
GFR, Estimated: 58 mL/min — ABNORMAL LOW (ref >60)
Glucose, Bld: 158 mg/dL — ABNORMAL HIGH (ref 70–99)
Potassium: 3.8 mmol/L (ref 3.5–5.1)
Sodium: 134 mmol/L — ABNORMAL LOW (ref 135–145)

## 2023-01-31 LAB — HEPARIN LEVEL (UNFRACTIONATED): Heparin Unfractionated: 0.34 IU/mL (ref 0.30–0.70)

## 2023-01-31 NOTE — Plan of Care (Signed)
Phillip Rich

## 2023-01-31 NOTE — Progress Notes (Signed)
Patient ID: Phillip Rich, male   DOB: [birth year], 72 y.o.   MRN: [id number]     Advanced Heart Failure Rounding Note  PCP-Cardiologist: Rollene Rotunda, MD   Subjective:    Echo EF 20-25% with thrombus on the basal inferior wall, normal RV systolic function, mild-moderate MR.   RHC 9/18 RA 17 PA 58/30 (39) PCWP 28 CI 1.6 PVR 3.4 PFTs 9/24:  FEV1 2.7 (85%) FVC 3.49 (80%) DLCO 8.6 (33%)  Post cath started on milrinone and lasix drip + VAD w/u.   Milrinone increased to 0.375 yesterday. Switched to po lasix. Co-ox 63%.CVP 3  Feels great. Excited about possible VAD. No CP or SOB   Scr 1.4 -> 1.3  Objective:   Weight Range: 80.7 kg Body mass index is 26.27 kg/m.   Vital Signs:   Temp:  [97.4 F (36.3 C)-98 F (36.7 C)] 98 F (36.7 C) (09/21 0836) Pulse Rate:  [96-101] 101 (09/21 0836) Resp:  [18-19] 19 (09/21 0836) BP: (99-129)/(53-82) 116/65 (09/21 0836) SpO2:  [92 %-98 %] 92 % (09/21 0836) Weight:  [80.7 kg] 80.7 kg (09/21 0648) Last BM Date : 01/27/23  Weight change: Filed Weights   01/29/23 0511 01/30/23 0607 01/31/23 0648  Weight: 83.6 kg 82 kg 80.7 kg    Intake/Output:   Intake/Output Summary (Last 24 hours) at 01/31/2023 1258 Last data filed at 01/31/2023 1100 Gross per 24 hour  Intake 832.09 ml  Output 1650 ml  Net -817.91 ml      Physical Exam   General:  Well appearing. No resp difficulty HEENT: normal Neck: supple. no JVD. Carotids 2+ bilat; no bruits. No lymphadenopathy or thryomegaly appreciated. Cor: Regular tachy. No rubs, gallops or murmurs. Lungs: clear Abdomen: soft, nontender, nondistended. No hepatosplenomegaly. No bruits or masses. Good bowel sounds. Extremities: no cyanosis, clubbing, rash, edema Neuro: alert & orientedx3, cranial nerves grossly intact. moves all 4 extremities w/o difficulty. Affect pleasant   Telemetry   Sinus 90-105 Personally reviewed    Labs    CBC Recent Labs    01/29/23 1100 01/30/23 0443  01/31/23 0445  WBC 8.5 9.4 8.1  NEUTROABS 5.2  --   --   HGB 9.8* 8.9* 8.7*  HCT 29.3* 27.8* 27.9*  MCV 93.0 93.0 92.4  PLT 214 257 224   Basic Metabolic Panel Recent Labs    40/98/11 0443 01/31/23 0445  NA 137 134*  K 4.1 3.8  CL 96* 97*  CO2 30 30  GLUCOSE 122* 158*  BUN 29* 32*  CREATININE 1.42* 1.31*  CALCIUM 9.0 8.8*  MG 1.7 2.5*   Liver Function Tests No results for input(s): "AST", "ALT", "ALKPHOS", "BILITOT", "PROT", "ALBUMIN" in the last 72 hours.  No results for input(s): "LIPASE", "AMYLASE" in the last 72 hours. Cardiac Enzymes No results for input(s): "CKTOTAL", "CKMB", "CKMBINDEX", "TROPONINI" in the last 72 hours.  BNP: BNP (last 3 results) Recent Labs    09/09/22 1529 11/25/22 1506 01/27/23 1835  BNP 357.6* 1,548.2* >4,500.0*    ProBNP (last 3 results) No results for input(s): "PROBNP" in the last 8760 hours.   D-Dimer No results for input(s): "DDIMER" in the last 72 hours. Hemoglobin A1C Recent Labs    01/29/23 0259  HGBA1C 6.2*   Fasting Lipid Panel Recent Labs    01/29/23 0259  CHOL 97  HDL 21*  LDLCALC 61  TRIG 76  CHOLHDL 4.6   Thyroid Function Tests Recent Labs    01/29/23 0259  TSH 1.396  Other results:   Imaging    No results found.   Medications:     Scheduled Medications:  (feeding supplement) PROSource Plus  30 mL Oral TID BM   aspirin  81 mg Oral Daily   atorvastatin  80 mg Oral Daily   Chlorhexidine Gluconate Cloth  6 each Topical Daily   cholecalciferol  1,000 Units Oral q morning   digoxin  0.125 mg Oral Daily   DULoxetine  60 mg Oral Daily   empagliflozin  10 mg Oral Daily   feeding supplement  237 mL Oral BID BM   fluticasone  1 spray Each Nare Once per day on Monday Thursday   insulin aspart  0-9 Units Subcutaneous TID WC   loratadine  10 mg Oral Daily   multivitamin with minerals  1 tablet Oral Daily   pantoprazole  40 mg Oral Daily   sacubitril-valsartan  1 tablet Oral BID   sodium  chloride flush  10-40 mL Intracatheter Q12H   sodium chloride flush  3 mL Intravenous Q12H   spironolactone  12.5 mg Oral Daily   torsemide  40 mg Oral Daily    Infusions:  sodium chloride     heparin 1,100 Units/hr (01/31/23 1117)   milrinone 0.375 mcg/kg/min (01/31/23 0927)    PRN Medications: sodium chloride, acetaminophen, melatonin, ondansetron (ZOFRAN) IV, sodium chloride flush, sodium chloride flush    Assessment/Plan   1. Acute on chronic systolic CHF: Echo in 7/24 with EF 25-30%, mild LV dilation, inferior akinesis, RV reported normal.  Echo this admit EF 20-25% with thrombus on the basal inferior wall, normal RV systolic function, mild-moderate MR. Suspect mixed ischemic/nonischemic cardiomyopathy.  Patient was admitted with volume overload.  RHC showed elevated right and left heart filling pressures with CI 1.66 F/1.58 T.  PAPi low at 1.64. Started on milrinone and lasix gtt. On Milrinone 0.375 mcg/kg/min. Co-ox 63%.CVP 3-4 - Continue milrinone - Volume low. Stop torsemide for now  - Continue Entresto 49-51 mg twice a day.  - Continue digoxin 0.125 daily.  - Off ? blocker w/ low output  - VAD work up started. Lung function will be major hurdle. Spirometry ok but DLCO quite low. Pulmonary has seen. Will discuss with TCTS at [facility] on Monday. I gave him IS today and he did nearly 2L! Suspect he will be candidate - He does not have an ICD, not CRT candidate with narrow QRS.  ICD placement will depend on +/- LVAD and timing.  2. CAD: Cath in 9/23 with occluded mRCA, 70% long LCx stenosis, LAD ok.  No s/s angina  - Continue statin and ASA 81 3. H/o PE: Remote, has not been anticoagulated.  4. Type 2 diabetes: SSI for now.  5. LV Thrombus - now on heparin gtt. No bleeding 6. ILD  - Moderate ILD on CT chest. No change - PFTs 9/24:  FEV1 2.7 (85%) FVC 3.49 (80%) DLCO 8.6 (33%) - Pulmonary has seen. - Consider anti-fibrotic therapy as outpatient   Length of Stay: 3  Arvilla Meres, MD  01/31/2023, 12:58 PM  Advanced Heart Failure Team Pager [phone] (M-F; 7a - 5p)  Please contact [facility] for night-coverage after hours (5p -7a ) and weekends on [email or web address]

## 2023-01-31 NOTE — Progress Notes (Signed)
ANTICOAGULATION CONSULT NOTE - Initial Consult  Pharmacy Consult for heparin Indication: new LV thrombus  Allergies  Allergen Reactions   Codeine Nausea And Vomiting   Crestor [Rosuvastatin Calcium] Other (See Comments)    Leg aches   Erythromycin Rash    All Mycin drugs    Patient Measurements: Weight: 80.7 kg (177 lb 14.6 oz)   Vital Signs: Temp: 97.5 F (36.4 C) (09/21 0648) Temp Source: Oral (09/21 0648) BP: 115/68 (09/21 0648) Pulse Rate: 99 (09/21 0648)  Labs: Recent Labs    01/29/23 0259 01/29/23 1100 01/30/23 0443 01/31/23 0445  HGB  --  9.8* 8.9* 8.7*  HCT  --  29.3* 27.8* 27.9*  PLT  --  214 257 224  APTT 41*  --   --   --   LABPROT 16.5*  --   --   --   INR 1.3*  --   --   --   HEPARINUNFRC  --   --  0.46 0.34  CREATININE 1.37*  --  1.42* 1.31*    Estimated Creatinine Clearance: 51 mL/min (A) (by C-G formula based on SCr of 1.31 mg/dL (H)).   Medical History: Past Medical History:  Diagnosis Date   Anxiety    BPH (benign prostatic hyperplasia)    Complication of anesthesia    Diabetes mellitus    DJD (degenerative joint disease)    GERD (gastroesophageal reflux disease)    HTN (hypertension)    Hyperlipidemia    ILD (interstitial lung disease) (HCC)    Left renal mass    Melanoma (HCC)    Mild sleep apnea    Sleep study pending   PONV (postoperative nausea and vomiting)       Assessment: 71yom with HF EF 25% admitted with volume overload and low cardiac output after RHC and started on milrinone. New LV thrombus found on ECHO. W/u for possible LVAD - will dose with heparin for anticoagulation  9/21: HL 0.34 therapeutic at 1100 u/hr. CBC ok. No signs of bleeding or issues with heparin infusion per RN  Goal of Therapy:  Heparin level 0.3-0.7 units/ml Monitor platelets by anticoagulation protocol: Yes   Plan:  Continue heparin at current rate : 1100 units/hr Daily HL , CBC, signs of bleeding F/u LVAD workup  Verdene Rio,  PharmD PGY1 Pharmacy Resident

## 2023-02-01 DIAGNOSIS — I5023 Acute on chronic systolic (congestive) heart failure: Secondary | ICD-10-CM | POA: Diagnosis not present

## 2023-02-01 LAB — BASIC METABOLIC PANEL
Anion gap: 7 (ref 5–15)
BUN: 31 mg/dL — ABNORMAL HIGH (ref 8–23)
CO2: 28 mmol/L (ref 22–32)
Calcium: 8.5 mg/dL — ABNORMAL LOW (ref 8.9–10.3)
Chloride: 98 mmol/L (ref 98–111)
Creatinine, Ser: 1.2 mg/dL (ref 0.61–1.24)
GFR, Estimated: >60 mL/min (ref >60)
Glucose, Bld: 174 mg/dL — ABNORMAL HIGH (ref 70–99)
Potassium: 4 mmol/L (ref 3.5–5.1)
Sodium: 133 mmol/L — ABNORMAL LOW (ref 135–145)

## 2023-02-01 LAB — COOXEMETRY PANEL
Carboxyhemoglobin: 2.5 % — ABNORMAL HIGH (ref 0.5–1.5)
Carboxyhemoglobin: 2.2 % — ABNORMAL HIGH (ref 0.5–1.5)
Methemoglobin: 3.2 % — ABNORMAL HIGH (ref 0.0–1.5)
Methemoglobin: 2.1 % — ABNORMAL HIGH (ref 0.0–1.5)
O2 Saturation: 47.7 %
O2 Saturation: 59.8 %
Total hemoglobin: 11.1 g/dL — ABNORMAL LOW (ref 12.0–16.0)
Total hemoglobin: 11 g/dL — ABNORMAL LOW (ref 12.0–16.0)

## 2023-02-01 LAB — CBC
HCT: 33.2 % — ABNORMAL LOW (ref 39.0–52.0)
Hemoglobin: 10.7 g/dL — ABNORMAL LOW (ref 13.0–17.0)
MCH: 30 pg (ref 26.0–34.0)
MCHC: 32.2 g/dL (ref 30.0–36.0)
MCV: 93 fL (ref 80.0–100.0)
Platelets: 224 10*3/uL (ref 150–400)
RBC: 3.57 MIL/uL — ABNORMAL LOW (ref 4.22–5.81)
RDW: 15.8 % — ABNORMAL HIGH (ref 11.5–15.5)
WBC: 7.9 10*3/uL (ref 4.0–10.5)
nRBC: 0 % (ref 0.0–0.2)

## 2023-02-01 LAB — GLUCOSE, CAPILLARY
Glucose-Capillary: 146 mg/dL — ABNORMAL HIGH (ref 70–99)
Glucose-Capillary: 135 mg/dL — ABNORMAL HIGH (ref 70–99)
Glucose-Capillary: 125 mg/dL — ABNORMAL HIGH (ref 70–99)
Glucose-Capillary: 187 mg/dL — ABNORMAL HIGH (ref 70–99)

## 2023-02-01 LAB — MAGNESIUM: Magnesium: 2 mg/dL (ref 1.7–2.4)

## 2023-02-01 LAB — HEPARIN LEVEL (UNFRACTIONATED): Heparin Unfractionated: 0.41 IU/mL (ref 0.30–0.70)

## 2023-02-01 MED ORDER — SPIRONOLACTONE 25 MG PO TABS
25.0000 mg | ORAL_TABLET | Freq: Every day | ORAL | Status: DC
  Administered 2023-02-02 – 2023-02-04 (×3): 25 mg via ORAL
  Filled 2023-02-01 (×3): qty 1

## 2023-02-01 NOTE — Progress Notes (Addendum)
Patient ID: Phillip Rich, male   DOB: 05-04-1951, 72 y.o.   MRN: [id number]     Advanced Heart Failure Rounding Note  PCP-Cardiologist: Rollene Rotunda, MD   Subjective:    Echo EF 20-25% with thrombus on the basal inferior wall, normal RV systolic function, mild-moderate MR.   RHC 9/18 RA 17 PA 58/30 (39) PCWP 28 CI 1.6 PVR 3.4 PFTs 9/24:  FEV1 2.7 (85%) FVC 3.49 (80%) DLCO 8.6 (33%)  Post cath started on milrinone and lasix drip + VAD w/u.   Milrinone 0.375.  He is on torsemide 40 mg daily with CVP < 5. Co-ox 48% early am.  He feels good, walked hall today.   Scr 1.4 -> 1.3 -> 1.2  Objective:   Weight Range: 79.5 kg Body mass index is 25.89 kg/m.   Vital Signs:   Temp:  [97.5 F (36.4 C)-97.9 F (36.6 C)] 97.9 F (36.6 C) (09/22 0700) Pulse Rate:  [99-107] 101 (09/22 0700) Resp:  [20] 20 (09/22 0545) BP: (110-125)/(56-74) 110/56 (09/22 0700) SpO2:  [92 %-97 %] 93 % (09/22 0700) Weight:  [79.5 kg] 79.5 kg (09/22 0545) Last BM Date : 01/27/23  Weight change: Filed Weights   01/30/23 0607 01/31/23 0648 02/01/23 0545  Weight: 82 kg 80.7 kg 79.5 kg    Intake/Output:   Intake/Output Summary (Last 24 hours) at 02/01/2023 1125 Last data filed at 02/01/2023 1058 Gross per 24 hour  Intake 1542.67 ml  Output 2900 ml  Net -1357.33 ml      Physical Exam   General: NAD Neck: No JVD, no thyromegaly or thyroid nodule.  Lungs: Clear to auscultation bilaterally with normal respiratory effort. CV: Lateral PMI.  Heart regular S1/S2, no S3/S4, no murmur.  No peripheral edema.    Abdomen: Soft, nontender, no hepatosplenomegaly, no distention.  Skin: Intact without lesions or rashes.  Neurologic: Alert and oriented x 3.  Psych: Normal affect. Extremities: No clubbing or cyanosis.  HEENT: Normal.    Telemetry   NSR 90s-100s   Labs    CBC Recent Labs    01/31/23 0445 02/01/23 0916  WBC 8.1 7.9  HGB 8.7* 10.7*  HCT 27.9* 33.2*  MCV 92.4 93.0  PLT 224 224    Basic Metabolic Panel Recent Labs    62/13/08 0445 02/01/23 0916  NA 134* 133*  K 3.8 4.0  CL 97* 98  CO2 30 28  GLUCOSE 158* 174*  BUN 32* 31*  CREATININE 1.31* 1.20  CALCIUM 8.8* 8.5*  MG 2.5* 2.0   Liver Function Tests No results for input(s): "AST", "ALT", "ALKPHOS", "BILITOT", "PROT", "ALBUMIN" in the last 72 hours.  No results for input(s): "LIPASE", "AMYLASE" in the last 72 hours. Cardiac Enzymes No results for input(s): "CKTOTAL", "CKMB", "CKMBINDEX", "TROPONINI" in the last 72 hours.  BNP: BNP (last 3 results) Recent Labs    09/09/22 1529 11/25/22 1506 01/27/23 1835  BNP 357.6* 1,548.2* >4,500.0*    ProBNP (last 3 results) No results for input(s): "PROBNP" in the last 8760 hours.   D-Dimer No results for input(s): "DDIMER" in the last 72 hours. Hemoglobin A1C No results for input(s): "HGBA1C" in the last 72 hours.  Fasting Lipid Panel No results for input(s): "CHOL", "HDL", "LDLCALC", "TRIG", "CHOLHDL", "LDLDIRECT" in the last 72 hours.  Thyroid Function Tests No results for input(s): "TSH", "T4TOTAL", "T3FREE", "THYROIDAB" in the last 72 hours.  Invalid input(s): "FREET3"   Other results:   Imaging    No results found.   Medications:  Scheduled Medications:  (feeding supplement) PROSource Plus  30 mL Oral TID BM   aspirin  81 mg Oral Daily   atorvastatin  80 mg Oral Daily   Chlorhexidine Gluconate Cloth  6 each Topical Daily   cholecalciferol  1,000 Units Oral q morning   digoxin  0.125 mg Oral Daily   DULoxetine  60 mg Oral Daily   empagliflozin  10 mg Oral Daily   feeding supplement  237 mL Oral BID BM   fluticasone  1 spray Each Nare Once per day on Monday Thursday   insulin aspart  0-9 Units Subcutaneous TID WC   loratadine  10 mg Oral Daily   multivitamin with minerals  1 tablet Oral Daily   pantoprazole  40 mg Oral Daily   sacubitril-valsartan  1 tablet Oral BID   sodium chloride flush  10-40 mL Intracatheter Q12H    sodium chloride flush  3 mL Intravenous Q12H   [START ON 02/02/2023] spironolactone  25 mg Oral Daily   torsemide  40 mg Oral Daily    Infusions:  sodium chloride     heparin 1,100 Units/hr (02/01/23 1034)   milrinone 0.375 mcg/kg/min (02/01/23 0636)    PRN Medications: sodium chloride, acetaminophen, melatonin, ondansetron (ZOFRAN) IV, sodium chloride flush, sodium chloride flush    Assessment/Plan   1. Acute on chronic systolic CHF: Echo in 7/24 with EF 25-30%, mild LV dilation, inferior akinesis, RV reported normal.  Echo this admit EF 20-25% with thrombus on the basal inferior wall, normal RV systolic function, mild-moderate MR. Suspect mixed ischemic/nonischemic cardiomyopathy.  Patient was admitted with volume overload.  RHC showed elevated right and left heart filling pressures with CI 1.66 F/1.58 T.  PAPi low at 1.64. Started on milrinone and lasix gtt. On Milrinone 0.375 mcg/kg/min. Co-ox early am 48%.  CVP <5.  Feels great.  - Continue milrinone at 0.375, will recheck co-ox now that he is up and awake => repeat 60%.  - Can hold torsemide for now, reassess CVP daily.   - Continue Entresto 24/26 mg twice a day.  - Increase spironolactone to 25 mg daily.  - Continue Jardiance 10 mg daily.  - Continue digoxin 0.125 daily.  - Off ? blocker w/ low output  - VAD work up started. Lung function will be major hurdle. Spirometry ok but DLCO quite low. Pulmonary has seen. Will discuss with TCTS at [facility] on Monday. IS about 1800 for me today.  - He does not have an ICD, not CRT candidate with narrow QRS.  ICD placement will depend on +/- LVAD and timing.  2. CAD: Cath in 9/23 with occluded mRCA, 70% long LCx stenosis, LAD ok.  No s/s angina  - Continue statin and ASA 81 3. H/o PE: Remote, has not been anticoagulated.  4. Type 2 diabetes: SSI for now.  5. LV Thrombus - now on heparin gtt. No bleeding 6. ILD" Moderate ILD on CT chest.  PFTs 9/24 with FEV1 2.7 (85%) FVC 3.49 (80%) DLCO 8.6  (33%) => mild restriction but very low DLCO.  - Pulmonary has seen. - Consider anti-fibrotic therapy as outpatient   Length of Stay: 4  Marca Ancona, MD  02/01/2023, 11:25 AM  Advanced Heart Failure Team Pager [phone] (M-F; 7a - 5p)  Please contact [facility] for night-coverage after hours (5p -7a ) and weekends on [email or web address]

## 2023-02-02 DIAGNOSIS — Z7189 Other specified counseling: Secondary | ICD-10-CM | POA: Diagnosis not present

## 2023-02-02 DIAGNOSIS — E440 Moderate protein-calorie malnutrition: Secondary | ICD-10-CM | POA: Diagnosis not present

## 2023-02-02 DIAGNOSIS — Z515 Encounter for palliative care: Secondary | ICD-10-CM | POA: Diagnosis not present

## 2023-02-02 DIAGNOSIS — I5023 Acute on chronic systolic (congestive) heart failure: Secondary | ICD-10-CM | POA: Diagnosis not present

## 2023-02-02 LAB — GLUCOSE, CAPILLARY
Glucose-Capillary: 132 mg/dL — ABNORMAL HIGH (ref 70–99)
Glucose-Capillary: 172 mg/dL — ABNORMAL HIGH (ref 70–99)
Glucose-Capillary: 124 mg/dL — ABNORMAL HIGH (ref 70–99)
Glucose-Capillary: 156 mg/dL — ABNORMAL HIGH (ref 70–99)

## 2023-02-02 LAB — MAGNESIUM: Magnesium: 1.8 mg/dL (ref 1.7–2.4)

## 2023-02-02 LAB — BASIC METABOLIC PANEL
Anion gap: 9 (ref 5–15)
BUN: 33 mg/dL — ABNORMAL HIGH (ref 8–23)
CO2: 29 mmol/L (ref 22–32)
Calcium: 8.7 mg/dL — ABNORMAL LOW (ref 8.9–10.3)
Chloride: 96 mmol/L — ABNORMAL LOW (ref 98–111)
Creatinine, Ser: 1.15 mg/dL (ref 0.61–1.24)
GFR, Estimated: >60 mL/min (ref >60)
Glucose, Bld: 125 mg/dL — ABNORMAL HIGH (ref 70–99)
Potassium: 3.6 mmol/L (ref 3.5–5.1)
Sodium: 134 mmol/L — ABNORMAL LOW (ref 135–145)

## 2023-02-02 LAB — CBC
HCT: 33.2 % — ABNORMAL LOW (ref 39.0–52.0)
Hemoglobin: 10.8 g/dL — ABNORMAL LOW (ref 13.0–17.0)
MCH: 30.6 pg (ref 26.0–34.0)
MCHC: 32.5 g/dL (ref 30.0–36.0)
MCV: 94.1 fL (ref 80.0–100.0)
Platelets: 227 10*3/uL (ref 150–400)
RBC: 3.53 MIL/uL — ABNORMAL LOW (ref 4.22–5.81)
RDW: 15.9 % — ABNORMAL HIGH (ref 11.5–15.5)
WBC: 9.1 10*3/uL (ref 4.0–10.5)
nRBC: 0 % (ref 0.0–0.2)

## 2023-02-02 LAB — COOXEMETRY PANEL
Carboxyhemoglobin: 2.4 % — ABNORMAL HIGH (ref 0.5–1.5)
Methemoglobin: 1.1 % (ref 0.0–1.5)
O2 Saturation: 62.4 %
Total hemoglobin: 11.2 g/dL — ABNORMAL LOW (ref 12.0–16.0)

## 2023-02-02 LAB — HEPARIN LEVEL (UNFRACTIONATED): Heparin Unfractionated: 0.46 IU/mL (ref 0.30–0.70)

## 2023-02-02 MED ORDER — MAGNESIUM SULFATE 2 GM/50ML IV SOLN
2.0000 g | Freq: Once | INTRAVENOUS | Status: AC
  Administered 2023-02-02: 2 g via INTRAVENOUS
  Filled 2023-02-02: qty 50

## 2023-02-02 MED ORDER — SENNOSIDES-DOCUSATE SODIUM 8.6-50 MG PO TABS: 1.0000 | ORAL_TABLET | Freq: Every day | ORAL | Status: DC

## 2023-02-02 MED ORDER — POLYETHYLENE GLYCOL 3350 17 G PO PACK
17.0000 g | PACK | Freq: Every day | ORAL | Status: DC
  Administered 2023-02-02 – 2023-02-03 (×2): 17 g via ORAL
  Filled 2023-02-02 (×3): qty 1

## 2023-02-02 MED ORDER — TORSEMIDE 20 MG PO TABS
40.0000 mg | ORAL_TABLET | Freq: Every day | ORAL | Status: DC
  Administered 2023-02-03 – 2023-02-04 (×2): 40 mg via ORAL
  Filled 2023-02-02 (×2): qty 2

## 2023-02-02 MED ORDER — POTASSIUM CHLORIDE CRYS ER 20 MEQ PO TBCR
40.0000 meq | EXTENDED_RELEASE_TABLET | Freq: Once | ORAL | Status: AC
  Administered 2023-02-02: 40 meq via ORAL
  Filled 2023-02-02: qty 2

## 2023-02-02 NOTE — Progress Notes (Signed)
This chaplain is present with the Pt., notary, and witnesses for the notarizing of the Pt. Advance Directive:  HCPOA and Living Will after receiving AD education.  The Pt. named Jessee Avers as his healthcare agent. If this persona is unable or unwilling to serve in this role, the Pt. next choice is [employer].  The Pt. completed a Living Will.  The chaplain gave the Pt. the original AD along with two copies. The chaplain scanned the Pt. AD into the Pt. EMR.  The chaplain is available for F/U spiritual care as needed.  Chaplain Stephanie Acre [phone]

## 2023-02-02 NOTE — Plan of Care (Signed)
  Problem: Education: Goal: Understanding of CV disease, CV risk reduction, and recovery process will improve Outcome: Progressing   Problem: Fluid Volume: Goal: Ability to maintain a balanced intake and output will improve Outcome: Progressing   Problem: Health Behavior/Discharge Planning: Goal: Ability to manage health-related needs will improve Outcome: Progressing

## 2023-02-02 NOTE — Progress Notes (Signed)
Mobility Specialist Progress Note:   02/02/23 1601  Mobility  Activity Ambulated with assistance in hallway  Level of Assistance Contact guard assist, steadying assist  Assistive Device Other (Comment) (IV Pole)  Distance Ambulated (ft) 500 ft  Activity Response Tolerated well  Mobility Referral Yes  $Mobility charge 1 Mobility  Mobility Specialist Start Time (ACUTE ONLY) 1550  Mobility Specialist Stop Time (ACUTE ONLY) 1600  Mobility Specialist Time Calculation (min) (ACUTE ONLY) 10 min    Received pt supine in bed having no complaints and agreeable to mobility. Pt was asymptomatic throughout ambulation and returned to room w/o fault. Left sitting EOB w/ call bell in reach and all needs met.  Thompson Grayer Mobility Specialist  Please contact vis Secure Chat or  Rehab Office [phone]

## 2023-02-02 NOTE — TOC Progression Note (Signed)
Transition of Care [facility]) - Progression Note    Patient Details  Name: Phillip Rich MRN: [id number] Date of Birth: Dec 06, 1950  Transition of Care [facility]) CM/SW Contact  Nicanor Bake Phone Number: [phone] 02/02/2023, 3:08 PM  Clinical Narrative:  HF CSW attempted to meet with pt at bedside. Pt had visitors in the room. CSW will follow up with pt at a later time.   TOC will continue following.        Expected Discharge Plan: Home/Self Care Barriers to Discharge: Continued Medical Work up  Expected Discharge Plan and Services       Living arrangements for the past 2 months: Single Family Home                                       Social Determinants of Health (SDOH) Interventions SDOH Screenings   Food Insecurity: No Food Insecurity (01/28/2023)  Housing: Low Risk  (01/28/2023)  Transportation Needs: No Transportation Needs (01/28/2023)  Utilities: Not At Risk (01/28/2023)  Alcohol Screen: Low Risk  (01/16/2022)  Financial Resource Strain: Medium Risk (01/16/2022)  Tobacco Use: Medium Risk (01/27/2023)    Readmission Risk Interventions     No data to display

## 2023-02-02 NOTE — Progress Notes (Signed)
ANTICOAGULATION CONSULT NOTE - Follow-up  Pharmacy Consult for heparin Indication: new LV thrombus  Allergies  Allergen Reactions   Codeine Nausea And Vomiting   Crestor [Rosuvastatin Calcium] Other (See Comments)    Leg aches   Erythromycin Rash    All Mycin drugs    Patient Measurements: Weight: 78 kg (171 lb 14.4 oz)   Vital Signs: Temp: 97.6 F (36.4 C) (09/23 0700) Temp Source: Oral (09/23 0700) BP: 116/71 (09/23 0700) Pulse Rate: 111 (09/23 0700)  Labs: Recent Labs    01/31/23 0445 02/01/23 0916 02/02/23 0454  HGB 8.7* 10.7* 10.8*  HCT 27.9* 33.2* 33.2*  PLT 224 224 227  HEPARINUNFRC 0.34 0.41 0.46  CREATININE 1.31* 1.20 1.15    CrCl cannot be calculated (Unknown ideal weight.).   Medical History: Past Medical History:  Diagnosis Date   Anxiety    BPH (benign prostatic hyperplasia)    Complication of anesthesia    Diabetes mellitus    DJD (degenerative joint disease)    GERD (gastroesophageal reflux disease)    HTN (hypertension)    Hyperlipidemia    ILD (interstitial lung disease) (HCC)    Left renal mass    Melanoma (HCC)    Mild sleep apnea    Sleep study pending   PONV (postoperative nausea and vomiting)       Assessment: 71yom with HF EF 25% admitted with volume overload and low cardiac output after RHC and started on milrinone. New LV thrombus found on ECHO. W/u for possible LVAD - will treat with heparin for anticoagulation  Heparin level is therapeutic at 0.46 on IV UFH 1100 units/hour. No signs or symptoms of bleeding or bruising at this time.   Goal of Therapy:  Heparin level 0.3-0.7 units/ml Monitor platelets by anticoagulation protocol: Yes   Plan:  Continue heparin at current rate : 1100 units/hr Daily HL , CBC, signs of bleeding F/u LVAD workup  Wilmer Floor, PharmD PGY2 Cardiology Pharmacy Resident

## 2023-02-02 NOTE — Care Management Important Message (Signed)
Important Message  Patient Details  Name: Phillip Rich MRN: [id number] Date of Birth: May 01, 1951   Important Message Given:  Yes - Medicare IM     Dorena Bodo 02/02/2023, 3:44 PM

## 2023-02-02 NOTE — Progress Notes (Signed)
SATURATION QUALIFICATIONS: (This note is used to comply with regulatory documentation for home oxygen)  Patient Saturations on Room Air at Rest = 99%  Patient Saturations on Room Air while Ambulating = 98%  Patient Saturations on 0 Liters of oxygen while Ambulating = 98%  Please briefly explain why patient needs home oxygen: ambulated without any need for supplemental oxygen. Elnita Maxwell, RN

## 2023-02-02 NOTE — Progress Notes (Addendum)
Patient ID: Phillip Rich, male   DOB: [birth year], 72 y.o.   MRN: [id number]     Advanced Heart Failure Rounding Note  PCP-Cardiologist: Rollene Rotunda, MD   Subjective:    Echo EF 20-25% with thrombus on the basal inferior wall, normal RV systolic function, mild-moderate MR.   RHC 9/18 RA 17 PA 58/30 (39) PCWP 28 CI 1.6 PVR 3.4 PFTs 9/24:  FEV1 2.7 (85%) FVC 3.49 (80%) DLCO 8.6 (33%)  Post cath started on milrinone and lasix drip + VAD w/u.   Remains on Milrinone 0.375.  Co-ox 62%  Good UOP w/ torsemide, 3.5L out yesterday. Wt down another 4 lb. CVP still < 5. SCr stable 1.15, K 3.6.  Sitting up in bed. Denies dyspnea. Appetite is good. Has been ambulating the halls ok. Only complaint is constipation. No BM in 6 days.    Objective:   Weight Range: 78 kg Body mass index is 25.39 kg/m.   Vital Signs:   Temp:  [97.4 F (36.3 C)-98 F (36.7 C)] 97.6 F (36.4 C) (09/23 0700) Pulse Rate:  [100-111] 111 (09/23 0700) Resp:  [16-20] 19 (09/23 0700) BP: (112-126)/(68-97) 116/71 (09/23 0700) SpO2:  [93 %-95 %] 94 % (09/23 0700) Weight:  [78 kg] 78 kg (09/23 0355) Last BM Date : 01/27/23  Weight change: Filed Weights   01/31/23 0648 02/01/23 0545 02/02/23 0355  Weight: 80.7 kg 79.5 kg 78 kg    Intake/Output:   Intake/Output Summary (Last 24 hours) at 02/02/2023 0836 Last data filed at 02/02/2023 0449 Gross per 24 hour  Intake 753.08 ml  Output 3485 ml  Net -2731.92 ml      Physical Exam   CVP 3  General:  Well appearing. No respiratory difficulty HEENT: normal Neck: supple. no JVD. Carotids 2+ bilat; no bruits. No lymphadenopathy or thyromegaly appreciated. Cor: PMI nondisplaced. Regular rate & rhythm. No rubs, gallops or murmurs. Lungs: decreased BS + inspirator/expiratory rhonchi bilaterally  Abdomen: soft, nontender, nondistended. No hepatosplenomegaly. No bruits or masses. Good bowel sounds. Extremities: no cyanosis, clubbing, rash, edema Neuro: alert &  oriented x 3, cranial nerves grossly intact. moves all 4 extremities w/o difficulty. Affect pleasant.   Telemetry   Sinus tach low 100s, 1 6 beat run of NSVT early this am, personally reviewed     Labs    CBC Recent Labs    02/01/23 0916 02/02/23 0454  WBC 7.9 9.1  HGB 10.7* 10.8*  HCT 33.2* 33.2*  MCV 93.0 94.1  PLT 224 227   Basic Metabolic Panel Recent Labs    21/30/86 0916 02/02/23 0454  NA 133* 134*  K 4.0 3.6  CL 98 96*  CO2 28 29  GLUCOSE 174* 125*  BUN 31* 33*  CREATININE 1.20 1.15  CALCIUM 8.5* 8.7*  MG 2.0 1.8   Liver Function Tests No results for input(s): "AST", "ALT", "ALKPHOS", "BILITOT", "PROT", "ALBUMIN" in the last 72 hours.  No results for input(s): "LIPASE", "AMYLASE" in the last 72 hours. Cardiac Enzymes No results for input(s): "CKTOTAL", "CKMB", "CKMBINDEX", "TROPONINI" in the last 72 hours.  BNP: BNP (last 3 results) Recent Labs    09/09/22 1529 11/25/22 1506 01/27/23 1835  BNP 357.6* 1,548.2* >4,500.0*    ProBNP (last 3 results) No results for input(s): "PROBNP" in the last 8760 hours.   D-Dimer No results for input(s): "DDIMER" in the last 72 hours. Hemoglobin A1C No results for input(s): "HGBA1C" in the last 72 hours.  Fasting Lipid Panel No results for  input(s): "CHOL", "HDL", "LDLCALC", "TRIG", "CHOLHDL", "LDLDIRECT" in the last 72 hours.  Thyroid Function Tests No results for input(s): "TSH", "T4TOTAL", "T3FREE", "THYROIDAB" in the last 72 hours.  Invalid input(s): "FREET3"   Other results:   Imaging    No results found.   Medications:     Scheduled Medications:  (feeding supplement) PROSource Plus  30 mL Oral TID BM   aspirin  81 mg Oral Daily   atorvastatin  80 mg Oral Daily   Chlorhexidine Gluconate Cloth  6 each Topical Daily   cholecalciferol  1,000 Units Oral q morning   digoxin  0.125 mg Oral Daily   DULoxetine  60 mg Oral Daily   empagliflozin  10 mg Oral Daily   feeding supplement  237 mL  Oral BID BM   fluticasone  1 spray Each Nare Once per day on Monday Thursday   insulin aspart  0-9 Units Subcutaneous TID WC   loratadine  10 mg Oral Daily   multivitamin with minerals  1 tablet Oral Daily   pantoprazole  40 mg Oral Daily   potassium chloride  40 mEq Oral Once   sacubitril-valsartan  1 tablet Oral BID   sodium chloride flush  10-40 mL Intracatheter Q12H   sodium chloride flush  3 mL Intravenous Q12H   spironolactone  25 mg Oral Daily   torsemide  40 mg Oral Daily    Infusions:  sodium chloride     heparin 1,100 Units/hr (02/01/23 1034)   magnesium sulfate bolus IVPB     milrinone 0.375 mcg/kg/min (02/02/23 0445)    PRN Medications: sodium chloride, acetaminophen, melatonin, ondansetron (ZOFRAN) IV, sodium chloride flush, sodium chloride flush    Assessment/Plan   1. Acute on chronic systolic CHF: Echo in 7/24 with EF 25-30%, mild LV dilation, inferior akinesis, RV reported normal.  Echo this admit EF 20-25% with thrombus on the basal inferior wall, normal RV systolic function, mild-moderate MR. Suspect mixed ischemic/nonischemic cardiomyopathy.  Patient was admitted with volume overload.  RHC showed elevated right and left heart filling pressures with CI 1.66 F/1.58 T.  PAPi low at 1.64. Started on milrinone and lasix gtt. On Milrinone 0.375 mcg/kg/min. Co-ox 62%.  CVP <5.  AKI resolved, SCr 1.15.  - Continue milrinone at 0.375 - Hold torsemide for now given low CVP, reassess CVP daily. May need to restart tomorrow.  - Continue Entresto 24/26 mg twice a day.  - Continue spironolactone to 25 mg daily.  - Continue Jardiance 10 mg daily.  - Continue digoxin 0.125 daily.  - Off ? blocker w/ low output  - VAD work up started. Lung function will be major hurdle. Spirometry ok but DLCO quite low. Pulmonary has seen. Will discuss with T[facility] at [facility] today.  - He does not have an ICD, not CRT candidate with narrow QRS.  ICD placement will depend on +/- LVAD and timing.  2.  CAD: Cath in 9/23 with occluded mRCA, 70% long LCx stenosis, LAD ok.  No s/s angina  - Continue statin and ASA 81 3. H/o PE: Remote, has not been anticoagulated.  4. Type 2 diabetes: SSI for now.  5. LV Thrombus - now on heparin gtt. No bleeding 6. ILD" Moderate ILD on CT chest.  PFTs 9/24 with FEV1 2.7 (85%) FVC 3.49 (80%) DLCO 8.6 (33%) => mild restriction but very low DLCO.  - Pulmonary has seen. - Consider anti-fibrotic therapy as outpatient 7. Constipation: start bowel regimen.   Length of Stay: 5  Robbie Lis, PA-C  02/02/2023, 8:36 AM  Advanced Heart Failure Team Pager [phone] (M-F; 7a - 5p)  Please contact [facility] for night-coverage after hours (5p -7a ) and weekends on [email or web address]  Patient seen and examined with the above-signed Advanced Practice Provider and/or Housestaff. I personally reviewed laboratory data, imaging studies and relevant notes. I independently examined the patient and formulated the important aspects of the plan. I have edited the note to reflect any of my changes or salient points. I have personally discussed the plan with the patient and/or family.  Remains on milrinone 0.375. Co-ox and CVP ok. Feels good. Denies CP or SOB   General:  Well appearing. No resp difficulty HEENT: normal Neck: supple. no JVD. Carotids 2+ bilat; no bruits. No lymphadenopathy or thryomegaly appreciated. Cor: PMI nondisplaced. Regular rate & rhythm. No rubs, gallops or murmurs. Lungs: decreased throughout Abdomen: soft, nontender, nondistended. No hepatosplenomegaly. No bruits or masses. Good bowel sounds. Extremities: no cyanosis, clubbing, rash, edema Neuro: alert & orientedx3, cranial nerves grossly intact. moves all 4 extremities w/o difficulty. Affect pleasant  Much improved on milrinone. Patient discussed at length at [facility] and felt not to be candidate for VAD due to ILD and severely depressed DLCO. Will offer home inotropes with consideration of [facility] referral  for second opinion. I will d/w [facility] Transplant/VAD team.   Arvilla Meres, MD  7:35 PM

## 2023-02-02 NOTE — Plan of Care (Signed)
  Problem: Education: Goal: Understanding of CV disease, CV risk reduction, and recovery process will improve Outcome: Progressing   Problem: Activity: Goal: Ability to return to baseline activity level will improve Outcome: Progressing   Problem: Cardiovascular: Goal: Ability to achieve and maintain adequate cardiovascular perfusion will improve Outcome: Progressing   Problem: Coping: Goal: Ability to adjust to condition or change in health will improve Outcome: Progressing

## 2023-02-02 NOTE — Progress Notes (Signed)
Daily Progress Note   Patient Name: Phillip Rich       Date: 02/02/2023 DOB: [birth year]  Age: 72 y.o. MRN#: [id number] Attending Physician: Aundra Dubin, MD Primary Care Physician: Merlene Laughter, MD (Inactive) Admit Date: 01/27/2023  Reason for Consultation/Follow-up: Establishing goals of care and Psychosocial/spiritual support  Subjective: Medical records reviewed including progress notes, labs, imaging. Patient assessed at the bedside.  He is sitting up in bedside chair, denies pain or distress.  Reports dyspnea is improving, he was able to ambulate further than before admission.  No family present during my visit.  Reoriented patient to the role of palliative care has next layer of support.  Created space and opportunity for patient's thoughts and feelings on his current illness.  He is feeling much more positive, feels good about proceeding with LVAD if deemed to be a good candidate.  He is happy with his PFT results.  He does wish he had more materials and education about the LVAD.  Provided reassurance that this will likely come after approval if the plan is to proceed.    Reviewed the previous conversation he had with my colleague and his plan for his friends to continue supporting him, as well as his sister coming up from [place] for 2 weeks to provide care after discharge.  His friends are currently helping him clean his home to make space for her.  Patient remains interested in completing advanced directives and also notes his desire for DNR form.  Provided education on advance care planning topics and assisted with filling out his paperwork in anticipation of finalization with chaplain and notary today.  He continues to desire to avoid tracheostomy, dialysis, feeding tubes, or  long-term ventilator dependency.  He would be willing to proceed with a short-term trial of mechanical ventilation.  He is appreciative of the assistance and denies other needs at this time.  Questions and concerns addressed.  Provided with hard choices for loving people booklet.  PMT will continue to support holistically.   Length of Stay: 5   Physical Exam Vitals and nursing note reviewed.  Constitutional:      General: He is not in acute distress. Pulmonary:     Effort: Pulmonary effort is normal. No respiratory distress.  Skin:    General: Skin is warm and dry.  Neurological:     Mental Status: He is alert and oriented to person, place, and time.  Psychiatric:        Mood and Affect: Mood normal.        Behavior: Behavior normal.            Vital Signs: BP 116/71 (BP Location: Left Arm)   Pulse 99   Temp 97.6 F (36.4 C) (Oral)   Resp 19   Wt 78 kg   SpO2 94%   BMI 25.39 kg/m  SpO2: SpO2: 94 % O2 Device: O2 Device: Room Air O2 Flow Rate:        Palliative Assessment/Data: 60%   Palliative Care Assessment & Plan   Patient Profile: 72 y.o. male  with past medical history of CAD, chronic systolic HF (likely mixed ischemic and non-ischemic), T2DM, HTN, renal mass s/p nephrectomy, COPD and history of PE admitted on 01/27/2023 with shortness of breath.  Patient presented with symptoms of volume overload and NYHA functional class III symptoms. Has developed progressive lower extremity swelling, dyspnea on exertion/conversation, weight gain, poor appetite and abdominal bloating all consistent with volume overload.  Patient is being considered for LVAD.      Assessment: Goals of care conversation Acute on chronic systolic CHF AKI, resolved CAD ILD  Recommendations/Plan: CODE STATUS changed to DNR/interventions desired Continue full scope treatment.  Patient remains interested in proceeding with LVAD if approved Assisted with advanced directive education, coordinating  with chaplain to finalize with notary.  Assistance appreciated Psychosocial and emotional support provided PMT will continue to follow and support intermittently   Prognosis:  Unable to determine  Discharge Planning: To Be Determined  Care plan was discussed with patient, RN, chaplain    MDM high         Karlita Lichtman Jeni Salles, PA-C  Palliative Medicine Team Team phone # [phone]  Thank you for allowing the Palliative Medicine Team to assist in the care of this patient. Please utilize secure chat with additional questions, if there is no response within 30 minutes please call the above phone number.  Palliative Medicine Team providers are available by phone from 7am to 7pm daily and can be reached through the team cell phone.  Should this patient require assistance outside of these hours, please call the patient's attending physician.

## 2023-02-03 ENCOUNTER — Other Ambulatory Visit (HOSPITAL_COMMUNITY): Payer: Self-pay

## 2023-02-03 DIAGNOSIS — I5023 Acute on chronic systolic (congestive) heart failure: Secondary | ICD-10-CM | POA: Diagnosis not present

## 2023-02-03 DIAGNOSIS — J849 Interstitial pulmonary disease, unspecified: Secondary | ICD-10-CM | POA: Diagnosis not present

## 2023-02-03 LAB — COOXEMETRY PANEL
Carboxyhemoglobin: 1.9 % — ABNORMAL HIGH (ref 0.5–1.5)
Methemoglobin: <0.7 % (ref 0.0–1.5)
O2 Saturation: 59.5 %
Total hemoglobin: 10.8 g/dL — ABNORMAL LOW (ref 12.0–16.0)

## 2023-02-03 LAB — BASIC METABOLIC PANEL
Anion gap: 9 (ref 5–15)
BUN: 35 mg/dL — ABNORMAL HIGH (ref 8–23)
CO2: 28 mmol/L (ref 22–32)
Calcium: 8.9 mg/dL (ref 8.9–10.3)
Chloride: 95 mmol/L — ABNORMAL LOW (ref 98–111)
Creatinine, Ser: 1.22 mg/dL (ref 0.61–1.24)
GFR, Estimated: >60 mL/min (ref >60)
Glucose, Bld: 133 mg/dL — ABNORMAL HIGH (ref 70–99)
Potassium: 4.1 mmol/L (ref 3.5–5.1)
Sodium: 132 mmol/L — ABNORMAL LOW (ref 135–145)

## 2023-02-03 LAB — MAGNESIUM: Magnesium: 2.4 mg/dL (ref 1.7–2.4)

## 2023-02-03 LAB — HEPARIN LEVEL (UNFRACTIONATED): Heparin Unfractionated: 0.36 IU/mL (ref 0.30–0.70)

## 2023-02-03 LAB — GLUCOSE, CAPILLARY
Glucose-Capillary: 140 mg/dL — ABNORMAL HIGH (ref 70–99)
Glucose-Capillary: 152 mg/dL — ABNORMAL HIGH (ref 70–99)
Glucose-Capillary: 151 mg/dL — ABNORMAL HIGH (ref 70–99)
Glucose-Capillary: 150 mg/dL — ABNORMAL HIGH (ref 70–99)

## 2023-02-03 NOTE — TOC Progression Note (Signed)
Transition of Care [facility]) - Progression Note    Patient Details  Name: Phillip Rich MRN: [id number] Date of Birth: [birth year]  Transition of Care [facility]) CM/SW Contact  Nicanor Bake Phone Number: [phone] 02/03/2023, 2:09 PM  Clinical Narrative:   [facility] CSW met with pt at bedside. Pt stated that he was feeling a little anxious about transferring to [facility] because he has been having a great experience her, but he trust the doctors. Pt appeared to be in good spirits and optimistic about his future.   TOC will continue following.     Expected Discharge Plan: Home/Self Care Barriers to Discharge: Continued Medical Work up  Expected Discharge Plan and Services       Living arrangements for the past 2 months: Single Family Home                                       Social Determinants of Health (SDOH) Interventions SDOH Screenings   Food Insecurity: No Food Insecurity (01/28/2023)  Housing: Low Risk  (01/28/2023)  Transportation Needs: No Transportation Needs (01/28/2023)  Utilities: Not At Risk (01/28/2023)  Alcohol Screen: Low Risk  (01/16/2022)  Financial Resource Strain: Medium Risk (01/16/2022)  Tobacco Use: Medium Risk (01/27/2023)    Readmission Risk Interventions     No data to display

## 2023-02-03 NOTE — Progress Notes (Signed)
Mobility Specialist Progress Note:    02/03/23 1014  Mobility  Activity Ambulated with assistance in hallway  Level of Assistance Standby assist, set-up cues, supervision of patient - no hands on  Assistive Device Other (Comment) (IV Pole)  Distance Ambulated (ft) 300 ft  Activity Response Tolerated well  Mobility Referral Yes  $Mobility charge 1 Mobility  Mobility Specialist Start Time (ACUTE ONLY) 0920  Mobility Specialist Stop Time (ACUTE ONLY) 0932  Mobility Specialist Time Calculation (min) (ACUTE ONLY) 12 min   Received pt supine in bed having no complaints and agreeable to mobility. Pt was asymptomatic throughout ambulation and returned to room w/o fault. Left sitting EOB w/ call bell in reach and all needs met.   Thompson Grayer Mobility Specialist  Please contact vis Secure Chat or  Rehab Office [phone]

## 2023-02-03 NOTE — TOC Benefit Eligibility Note (Signed)
Patient Product/process development scientist completed.    The patient is insured through [facility]. Patient has Medicare and is not eligible for a copay card, but may be able to apply for patient assistance, if available.    Ran test claim for Eliquis 5 mg and the current 30 day co-pay is $149.53 due to being in Coverage Gap (donut hole).   This test claim was processed through [facility]- copay amounts may vary at other pharmacies due to pharmacy/plan contracts, or as the patient moves through the different stages of their insurance plan.     Roland Earl, CPHT Pharmacy Technician III Certified Patient Advocate [facility] Patient Advocate Team Direct Number: [phone]  Fax: [phone]

## 2023-02-03 NOTE — Progress Notes (Signed)
ANTICOAGULATION CONSULT NOTE - Follow-up  Pharmacy Consult for heparin Indication: new LV thrombus  Allergies  Allergen Reactions   Codeine Nausea And Vomiting   Crestor [Rosuvastatin Calcium] Other (See Comments)    Leg aches   Erythromycin Rash    All Mycin drugs    Patient Measurements: Weight: 78.4 kg (172 lb 14.4 oz)   Vital Signs: Temp: 98.5 F (36.9 C) (09/24 0729) Temp Source: Oral (09/24 0729) BP: 122/72 (09/24 0729) Pulse Rate: 100 (09/24 0833)  Labs: Recent Labs    02/01/23 0916 02/02/23 0454 02/03/23 0450  HGB 10.7* 10.8*  --   HCT 33.2* 33.2*  --   PLT 224 227  --   HEPARINUNFRC 0.41 0.46 0.36  CREATININE 1.20 1.15 1.22    CrCl cannot be calculated (Unknown ideal weight.).   Medical History: Past Medical History:  Diagnosis Date   Anxiety    BPH (benign prostatic hyperplasia)    Complication of anesthesia    Diabetes mellitus    DJD (degenerative joint disease)    GERD (gastroesophageal reflux disease)    HTN (hypertension)    Hyperlipidemia    ILD (interstitial lung disease) (HCC)    Left renal mass    Melanoma (HCC)    Mild sleep apnea    Sleep study pending   PONV (postoperative nausea and vomiting)       Assessment: 71yom with HF EF 25% admitted with volume overload and low cardiac output after RHC and started on milrinone. New LV thrombus found on ECHO. Treating with UFH while planning HF advanced therapies.  Heparin level is therapeutic at 0.36 on IV UFH 1100 units/hour. No signs or symptoms of bleeding or bruising at this time.   Goal of Therapy:  Heparin level 0.3-0.7 units/ml Monitor platelets by anticoagulation protocol: Yes   Plan:  Continue heparin at current rate : 1100 units/hr Daily HL , CBC, signs of bleeding F/u LVAD/PICC line plans  Wilmer Floor, PharmD PGY2 Cardiology Pharmacy Resident

## 2023-02-03 NOTE — TOC Progression Note (Signed)
Transition of Care [facility]) - Progression Note    Patient Details  Name: Phillip Rich MRN: [id number] Date of Birth: February 12, 1951  Transition [facility]) CM/SW Contact  Leone Haven, RN Phone Number: 02/03/2023, 11:44 AM  Clinical Narrative:    NCM notified by Jeri Modena that patient is going home tomorrow with home milrinone, [facility] will be providing the [facility] for patient and they will see him on Thursday for soc.  Pam will hook up the milrinone tomorrow and go over the teaching with patient.     Expected Discharge Plan: Home/Self Care Barriers to Discharge: Continued Medical Work up  Expected Discharge Plan and Services       Living arrangements for the past 2 months: Single Family Home                                       Social Determinants of Health (SDOH) Interventions SDOH Screenings   Food Insecurity: No Food Insecurity (01/28/2023)  Housing: Low Risk  (01/28/2023)  Transportation Needs: No Transportation Needs (01/28/2023)  Utilities: Not At Risk (01/28/2023)  Alcohol Screen: Low Risk  (01/16/2022)  Financial Resource Strain: Medium Risk (01/16/2022)  Tobacco Use: Medium Risk (01/27/2023)    Readmission Risk Interventions     No data to display

## 2023-02-03 NOTE — Plan of Care (Signed)
Problem: Activity: Goal: Risk for activity intolerance will decrease Outcome: Progressing

## 2023-02-03 NOTE — Discharge Summary (Incomplete)
Advanced Heart Failure Team  Discharge Summary   Patient ID: Phillip Rich MRN: [id number], DOB/AGE: [birth year] 72 y.o. Admit date: 01/27/2023 D/C date:     02/04/2023   Primary Discharge Diagnoses:  Acute on Chronic Systolic Heart Failure, Stage D/ Inotrope Dependent   Secondary Discharge Diagnoses:  ILD CAD LV Thrombus H/o PE  Type 2 DM   Hospital Course:   Phillip Rich is a 72 y.o. male with T2DM, HTN, renal mass s/p nephrectomy, COPD that presented to the [facility] ER w/ acute onset SOB on 08/27 when he was diagnosed with acute PE and HFrEF w/ an LVEF of 25%-30%; during admission, coronary angiography was significant for CTO of the RCA and diffusely diseased Lcx. RHC w/ elevated pre and post capillary filling pressures. He was diuresed and discharged home and started on some GDMT. Since that time, Phillip Rich has had a CMR demonstrating persistently reduced LV function (EF 20%) and CPX with severe cardiac limitations.   On 9/18, he was direct admitted from the [facility] for worsening HF symptoms, marked volume overload and concern for low output. Echo EF 20-25% with thrombus on the basal inferior wall, normal RV systolic function, mild-moderate MR. RHC demonstrated elevated biventricular filling pressures and severely reduce cardiac output w/ CI 1.6 (RA 17 PA 58/30 (39) PCWP 28 CI 1.6 PVR 3.4, PaPi low at 1.64). Post cath started on milrinone and lasix drip. He diuresed well w/ lasix gtt back to euvolemic state and able to transition back to PO diuretics but he did not tolerate milrinone wean and was felt to need advanced therapies. He underwent w/u for LVAD.  Unfortunately, chest CT showed moderate ILD and PFTs demonstrated mild restriction but very low DLCO=>FEV1 2.7 (85%) FVC 3.49 (80%) DLCO 8.6 (33%).    Patient case was discussed at [facility] and felt to be too high risk for LVAD due to ILD and severely depressed DLCO. Decision made to offer home inotropes with consideration of [facility]  referral for second opinion. Pt in agreement w/ home inotropes. Tunneled PICC was placed by IR. Home health and home milrinone arranged. He will be discharged home on 0.375 mcg/kg/min   On 9/25, he was last seen and examined by Dr. Gala Romney and felt stable for d/c home. Hospital f/u has been arranged w/ Dr. Gasper Lloyd.     Discharge Weight Range: 168 lb  Discharge Vitals: Blood pressure 126/82, pulse (!) 108, temperature 98 F (36.7 C), temperature source Oral, resp. rate 18, weight 76.4 kg, SpO2 92%.  Labs: Lab Results  Component Value Date   WBC 9.1 02/02/2023   HGB 10.8 (L) 02/02/2023   HCT 33.2 (L) 02/02/2023   MCV 94.1 02/02/2023   PLT 227 02/02/2023    Recent Labs  Lab 02/04/23 0926  NA 136  K 4.4  CL 97*  CO2 28  BUN 38*  CREATININE 1.27*  CALCIUM 9.3  GLUCOSE 160*   Lab Results  Component Value Date   CHOL 97 01/29/2023   HDL 21 (L) 01/29/2023   LDLCALC 61 01/29/2023   TRIG 76 01/29/2023   BNP (last 3 results) Recent Labs    11/25/22 1506 01/27/23 1835 02/04/23 0540  BNP 1,548.2* >4,500.0* 1,458.8*    ProBNP (last 3 results) No results for input(s): "PROBNP" in the last 8760 hours.   Diagnostic Studies/Procedures   IR Fluoro Guide CV Line Right  Result Date: 02/04/2023 INDICATION: 72 year old male in need of durable venous access for Milrinone support. EXAM: IR RIGHT FLUORO  GUIDE CV LINE; IR ULTRASOUND GUIDANCE VASC ACCESS RIGHT MEDICATIONS: None. ANESTHESIA/SEDATION: None. FLUOROSCOPY TIME:  Radiation exposure index: 1 mGy reference air kerma COMPLICATIONS: None immediate. PROCEDURE: Informed written consent was obtained from the patient after a thorough discussion of the procedural risks, benefits and alternatives. All questions were addressed. Maximal Sterile Barrier Technique was utilized including caps, mask, sterile gowns, sterile gloves, sterile drape, hand hygiene and skin antiseptic. A timeout was performed prior to the initiation of the  procedure. The right internal jugular vein was interrogated with ultrasound and found to be widely patent. An image was obtained and stored for the medical record. Local anesthesia was attained by infiltration with 1% lidocaine. A small dermatotomy was made. Under real-time sonographic guidance, the vessel was punctured with a 21 gauge micropuncture needle. Using standard technique, the initial micro needle was exchanged over a 0.018 micro wire for a peel-away sheath. A suitable skin exit site was selected on the anterior chest inferior to the clavicle. Local anesthesia was attained by infiltration with 1% lidocaine. A small dermatotomy was made. The dual lumen power injectable tunneled central venous catheter was then tunneled from the skin exit site to the dermatotomy overlying the venous access site. The catheter was cut to length (22 cm) and advanced through the peel-away sheath. The peel-away sheath was then removed. Final positioning of the catheter tip at the cavoatrial junction was confirmed and a fluoroscopic image saved for the medical record. The catheter flushes and aspirates easily. It was flushed, capped and secured to the skin with 0 Prolene suture. Sterile bandages were applied. IMPRESSION: Successful placement of right IJ approach tunneled dual lumen power line. Catheter tip is at the cavoatrial junction and the device is ready for immediate use. Electronically Signed   By: Malachy Moan M.D.   On: 02/04/2023 11:46   IR US Guide Vasc Access Right  Result Date: 02/04/2023 INDICATION: 72 year old male in need of durable venous access for Milrinone support. EXAM: IR RIGHT FLUORO GUIDE CV LINE; IR ULTRASOUND GUIDANCE VASC ACCESS RIGHT MEDICATIONS: None. ANESTHESIA/SEDATION: None. FLUOROSCOPY TIME:  Radiation exposure index: 1 mGy reference air kerma COMPLICATIONS: None immediate. PROCEDURE: Informed written consent was obtained from the patient after a thorough discussion of the procedural  risks, benefits and alternatives. All questions were addressed. Maximal Sterile Barrier Technique was utilized including caps, mask, sterile gowns, sterile gloves, sterile drape, hand hygiene and skin antiseptic. A timeout was performed prior to the initiation of the procedure. The right internal jugular vein was interrogated with ultrasound and found to be widely patent. An image was obtained and stored for the medical record. Local anesthesia was attained by infiltration with 1% lidocaine. A small dermatotomy was made. Under real-time sonographic guidance, the vessel was punctured with a 21 gauge micropuncture needle. Using standard technique, the initial micro needle was exchanged over a 0.018 micro wire for a peel-away sheath. A suitable skin exit site was selected on the anterior chest inferior to the clavicle. Local anesthesia was attained by infiltration with 1% lidocaine. A small dermatotomy was made. The dual lumen power injectable tunneled central venous catheter was then tunneled from the skin exit site to the dermatotomy overlying the venous access site. The catheter was cut to length (22 cm) and advanced through the peel-away sheath. The peel-away sheath was then removed. Final positioning of the catheter tip at the cavoatrial junction was confirmed and a fluoroscopic image saved for the medical record. The catheter flushes and aspirates easily. It was flushed, capped  and secured to the skin with 0 Prolene suture. Sterile bandages were applied. IMPRESSION: Successful placement of right IJ approach tunneled dual lumen power line. Catheter tip is at the cavoatrial junction and the device is ready for immediate use. Electronically Signed   By: Malachy Moan M.D.   On: 02/04/2023 11:46    Discharge Medications   Allergies as of 02/04/2023       Reactions   Codeine Nausea And Vomiting   Crestor [rosuvastatin Calcium] Other (See Comments)   Leg aches   Erythromycin Rash   All Mycin drugs         Medication List     STOP taking these medications    carvedilol 6.25 MG tablet Commonly known as: COREG   Entresto 97-103 MG Generic drug: sacubitril-valsartan Replaced by: Sherryll Burger 24-26 MG   furosemide 20 MG tablet Commonly known as: LASIX   losartan 25 MG tablet Commonly known as: COZAAR       TAKE these medications    albuterol 108 (90 Base) MCG/ACT inhaler Commonly known as: VENTOLIN HFA Inhale 1-2 puffs into the lungs every 6 (six) hours as needed for wheezing or shortness of breath.   aspirin EC 81 MG tablet Take 81 mg by mouth daily. Swallow whole.   atorvastatin 80 MG tablet Commonly known as: LIPITOR Take 1 tablet (80 mg total) by mouth daily.   digoxin 0.125 MG tablet Commonly known as: LANOXIN Take 1 tablet (0.125 mg total) by mouth daily. Start taking on: February 05, 2023   DULoxetine 60 MG capsule Commonly known as: CYMBALTA Take 60 mg by mouth daily.   Eliquis 5 MG Tabs tablet Generic drug: apixaban Take 1 tablet (5 mg total) by mouth 2 (two) times daily.   Entresto 24-26 MG Generic drug: sacubitril-valsartan Take 1 tablet by mouth 2 (two) times daily. Replaces: Entresto 97-103 MG   fluticasone 50 MCG/ACT nasal spray Commonly known as: FLONASE Place 1 spray into both nostrils 2 (two) times a week.   HumaLOG KwikPen 100 UNIT/ML KwikPen Generic drug: insulin lispro Inject 12 Units into the skin 2 (two) times daily.   Jardiance 10 MG Tabs tablet Generic drug: empagliflozin Take 1 tablet (10 mg total) by mouth daily before breakfast.   levocetirizine 5 MG tablet Commonly known as: XYZAL Take 5 mg by mouth daily.   Melatonin 10 MG Tabs Take 10 mg by mouth at bedtime as needed (Sleep).   metFORMIN 1000 MG tablet Commonly known as: GLUCOPHAGE Take 1,000 mg by mouth 2 (two) times daily.   milrinone 20 MG/100 ML Soln infusion Commonly known as: PRIMACOR Inject 0.0306 mg/min into the vein continuous.   omeprazole 20 MG  capsule Commonly known as: PRILOSEC Take 1 tablet by mouth every morning.   Ozempic (0.25 or 0.5 MG/DOSE) 2 MG/3ML Sopn Generic drug: Semaglutide(0.25 or 0.5MG /DOS) Inject 0.5 mg into the skin once a week.   spironolactone 25 MG tablet Commonly known as: ALDACTONE Take 1 tablet (25 mg total) by mouth daily. Start taking on: February 05, 2023   torsemide 20 MG tablet Commonly known as: DEMADEX Take 2 tablets (40 mg total) by mouth daily. Start taking on: February 05, 2023   vitamin B-12 100 MCG tablet Commonly known as: CYANOCOBALAMIN Take 100 mcg by mouth daily.   VITAMIN D PO Take 1,000 Units by mouth every morning.   zolpidem 10 MG tablet Commonly known as: AMBIEN Take 10 mg by mouth at bedtime as needed for sleep.  Disposition   The patient will be discharged in stable condition to home.   Follow-up Information     [facility] Follow up.   Why: HHRN for Home IV milrinone Contact information: [phone]        [facility] Follow up.   Why: Will provide IV medication [phone]        Stoneking, Hal, MD Follow up in 12 day(s).   Specialty: Internal Medicine Why: Follow up hospital appointment scheduled for Monday, February 16, 2023 @ 11:00 AM.  PLEASE ARRIVE 10-15 minutes early.  PLEASE call and cancel if you CANNOT make appointment. Contact information: [address] [place] [address] [phone]         Dorthula Nettles, DO Follow up.   Specialty: Cardiology Why: 02/20/23 at 3:40 PM   Hospital Follow-up in the Advanced [facility] Failure Clinic, Entrance C Contact information: [address] [place] [address] [phone]                   Duration of Discharge Encounter: Greater than 35 minutes   Signed, Robbie Lis, PA-C  02/04/2023, 2:12 PM   Agree with above. Ok for d/c today on home milrinone. HHRN arranged.   Arvilla Meres, MD  4:01 PM

## 2023-02-03 NOTE — Progress Notes (Addendum)
Patient ID: EYTHAN BEBEAU, male   DOB: June 26, 1950, 72 y.o.   MRN: [id number]     Advanced Heart Failure Rounding Note  PCP-Cardiologist: Rollene Rotunda, MD   Subjective:    Echo EF 20-25% with thrombus on the basal inferior wall, normal RV systolic function, mild-moderate MR.   RHC 9/18 RA 17 PA 58/30 (39) PCWP 28 CI 1.6 PVR 3.4 PFTs 9/24:  FEV1 2.7 (85%) FVC 3.49 (80%) DLCO 8.6 (33%)  Post cath started on milrinone and lasix drip  Remains on Milrinone 0.375.  Co-ox 60%. CVP 4-5   Feels well today. Denies dyspnea. No CP. Had BM yesterday.    Objective:   Weight Range: 78.4 kg Body mass index is 25.53 kg/m.   Vital Signs:   Temp:  [97.7 F (36.5 C)-98.5 F (36.9 C)] 98.5 F (36.9 C) (09/24 0729) Pulse Rate:  [95-110] 100 (09/24 0833) Resp:  [16-18] 17 (09/24 0729) BP: (113-127)/(72-78) 122/72 (09/24 0729) SpO2:  [94 %-98 %] 95 % (09/24 0729) Weight:  [78.4 kg] 78.4 kg (09/24 0426) Last BM Date : 02/02/23  Weight change: Filed Weights   02/01/23 0545 02/02/23 0355 02/03/23 0426  Weight: 79.5 kg 78 kg 78.4 kg    Intake/Output:   Intake/Output Summary (Last 24 hours) at 02/03/2023 0935 Last data filed at 02/03/2023 0800 Gross per 24 hour  Intake 741.67 ml  Output 1350 ml  Net -608.33 ml      Physical Exam   CVP 4-5  General:  Well appearing. No respiratory difficulty HEENT: normal Neck: supple. JVD 5 cm. Carotids 2+ bilat; no bruits. No lymphadenopathy or thyromegaly appreciated. Cor: PMI nondisplaced. Regular rate & rhythm. No rubs, gallops or murmurs. Lungs: clear Abdomen: soft, nontender, nondistended. No hepatosplenomegaly. No bruits or masses. Good bowel sounds. Extremities: no cyanosis, clubbing, rash, edema + RUE PICC  Neuro: alert & oriented x 3, cranial nerves grossly intact. moves all 4 extremities w/o difficulty. Affect pleasant.    Telemetry   NSR 90s personally reviewed     Labs    CBC Recent Labs    02/01/23 0916 02/02/23 0454   WBC 7.9 9.1  HGB 10.7* 10.8*  HCT 33.2* 33.2*  MCV 93.0 94.1  PLT 224 227   Basic Metabolic Panel Recent Labs    78/29/56 0454 02/03/23 0450  NA 134* 132*  K 3.6 4.1  CL 96* 95*  CO2 29 28  GLUCOSE 125* 133*  BUN 33* 35*  CREATININE 1.15 1.22  CALCIUM 8.7* 8.9  MG 1.8 2.4   Liver Function Tests No results for input(s): "AST", "ALT", "ALKPHOS", "BILITOT", "PROT", "ALBUMIN" in the last 72 hours.  No results for input(s): "LIPASE", "AMYLASE" in the last 72 hours. Cardiac Enzymes No results for input(s): "CKTOTAL", "CKMB", "CKMBINDEX", "TROPONINI" in the last 72 hours.  BNP: BNP (last 3 results) Recent Labs    09/09/22 1529 11/25/22 1506 01/27/23 1835  BNP 357.6* 1,548.2* >4,500.0*    ProBNP (last 3 results) No results for input(s): "PROBNP" in the last 8760 hours.   D-Dimer No results for input(s): "DDIMER" in the last 72 hours. Hemoglobin A1C No results for input(s): "HGBA1C" in the last 72 hours.  Fasting Lipid Panel No results for input(s): "CHOL", "HDL", "LDLCALC", "TRIG", "CHOLHDL", "LDLDIRECT" in the last 72 hours.  Thyroid Function Tests No results for input(s): "TSH", "T4TOTAL", "T3FREE", "THYROIDAB" in the last 72 hours.  Invalid input(s): "FREET3"   Other results:   Imaging    No results found.  Medications:     Scheduled Medications:  (feeding supplement) PROSource Plus  30 mL Oral TID BM   aspirin  81 mg Oral Daily   atorvastatin  80 mg Oral Daily   Chlorhexidine Gluconate Cloth  6 each Topical Daily   cholecalciferol  1,000 Units Oral q morning   digoxin  0.125 mg Oral Daily   DULoxetine  60 mg Oral Daily   empagliflozin  10 mg Oral Daily   feeding supplement  237 mL Oral BID BM   fluticasone  1 spray Each Nare Once per day on Monday Thursday   insulin aspart  0-9 Units Subcutaneous TID WC   loratadine  10 mg Oral Daily   multivitamin with minerals  1 tablet Oral Daily   pantoprazole  40 mg Oral Daily   polyethylene  glycol  17 g Oral Daily   sacubitril-valsartan  1 tablet Oral BID   senna-docusate  1 tablet Oral QHS   sodium chloride flush  10-40 mL Intracatheter Q12H   sodium chloride flush  3 mL Intravenous Q12H   spironolactone  25 mg Oral Daily   torsemide  40 mg Oral Daily    Infusions:  sodium chloride     heparin 1,100 Units/hr (02/02/23 0935)   milrinone 0.375 mcg/kg/min (02/03/23 0046)    PRN Medications: sodium chloride, acetaminophen, melatonin, ondansetron (ZOFRAN) IV, sodium chloride flush, sodium chloride flush    Assessment/Plan   1. Acute on chronic systolic CHF: Echo in 7/24 with EF 25-30%, mild LV dilation, inferior akinesis, RV reported normal.  Echo this admit EF 20-25% with thrombus on the basal inferior wall, normal RV systolic function, mild-moderate MR. Suspect mixed ischemic/nonischemic cardiomyopathy.  Patient was admitted with volume overload.  RHC showed elevated right and left heart filling pressures with CI 1.66 F/1.58 T.  PAPi low at 1.64. Started on milrinone and lasix gtt. On Milrinone 0.375 mcg/kg/min. Co-ox 60%.  CVP 4-5  - Discussed at [facility] and felt not to be candidate for VAD due to ILD and severely depressed DLCO. Will offer home inotropes with consideration of [facility] referral for second opinion. Discussed w/ patient and sister by phone today.  - Continue milrinone at 0.375 - Continue Torsemide 40 mg daily  - Continue Entresto 24/26 mg twice a day.  - Continue spironolactone 25 mg daily.  - Continue Jardiance 10 mg daily.  - Continue digoxin 0.125 daily.  - Off ? blocker w/ low output  - He does not have an ICD, not CRT candidate with narrow QRS.  ICD placement will depend on +/- LVAD and timing. Referring to [facility] 2. CAD: Cath in 9/23 with occluded mRCA, 70% long LCx stenosis, LAD ok.  No s/s angina  - Continue statin and ASA 81 3. H/o PE: Remote, has not been anticoagulated.  4. Type 2 diabetes: SSI for now.  5. LV Thrombus - now on heparin gtt. No  bleeding. No plans for VAD this admit, transition to Eliquis  6. ILD" Moderate ILD on CT chest.  PFTs 9/24 with FEV1 2.7 (85%) FVC 3.49 (80%) DLCO 8.6 (33%) => mild restriction but very low DLCO.  - Pulmonary has seen. - Consider anti-fibrotic therapy as outpatient 7. Constipation: resolved w/ bowel regimen.  Will reach out to [facility] VAD team. Will arrange plans for home milrinone. Jeri Modena notified.    Length of Stay: [address] Cordelia Poche  02/03/2023, 9:35 AM  Advanced Heart Failure Team Pager [phone] (M-F; 7a - 5p)  Please contact  [facility] for night-coverage after hours (5p -7a ) and weekends on [email or web address]  Patient seen and examined with the above-signed Advanced Practice Provider and/or Housestaff. I personally reviewed laboratory data, imaging studies and relevant notes. I independently examined the patient and formulated the important aspects of the plan. I have edited the note to reflect any of my changes or salient points. I have personally discussed the plan with the patient and/or family.  Remains on milrinone 0.375. Co-ox and CVP ok.  Feels great. No CP or SOB.    Patient discussed at length at [facility] and felt not to be candidate for VAD due to ILD and severely depressed DLCO. Will offer home inotropes with consideration of [facility] referral for second opinion.  I discussed case with Dr. Marchelle Gearing today and he says corrected DLCO is 52%  General:  Sitting up in bed No resp difficulty HEENT: normal Neck: supple. no JVD. Carotids 2+ bilat; no bruits. No lymphadenopathy or thryomegaly appreciated. Cor: Regular rate & rhythm. No rubs, gallops or murmurs. Lungs: clear Abdomen: soft, nontender, nondistended. No hepatosplenomegaly. No bruits or masses. Good bowel sounds. Extremities: no cyanosis, clubbing, rash, edema Neuro: alert & orientedx3, cranial nerves grossly intact. moves all 4 extremities w/o difficulty. Affect pleasant  Continue milrinone. Will discuss again  with [facility] regarding Dr. Jane Canary comments. Suspect still too high risk for VAD. Will arrange for home inotropes.   Arvilla Meres, MD  3:33 PM

## 2023-02-03 NOTE — Plan of Care (Signed)
Problem: Education: Goal: Understanding of CV disease, CV risk reduction, and recovery process will improve Outcome: Progressing   Problem: Activity: Goal: Ability to return to baseline activity level will improve Outcome: Progressing   Problem: Cardiovascular: Goal: Ability to achieve and maintain adequate cardiovascular perfusion will improve Outcome: Progressing   Problem: Cardiovascular: Goal: Vascular access site(s) Level 0-1 will be maintained Outcome: Progressing   Problem: Health Behavior/Discharge Planning: Goal: Ability to safely manage health-related needs after discharge will improve Outcome: Progressing

## 2023-02-03 NOTE — Progress Notes (Signed)
NAME:  Phillip Rich, MRN:  [id number], DOB:  [birth year], LOS: 6 ADMISSION DATE:  01/27/2023, CONSULTATION DATE:  9/20 REFERRING MD:  Shirlee Latch, CHIEF COMPLAINT:  abnormal CT chest    History of Present Illness:  This is a 72 year old male who was admitted 9/18 w/ cc: worsening shortness of breath, wt gain, dec appetite in spite of increased outpt diuresis. Had known h/o ICM prior EF 25-30% admitted for acute on chronic HF w/ concern for volume overload.  Seen by adv HF team. RHC completed 9/18: RA mean 17, RV 57/21, PA 58/30, mean 39 PCWP mean 28, Oxygen saturations: PA 42%, Cardiac Output (Fick) 3.37  Cardiac Index (Fick) 1.66 , PVR 3.4 WU PAPi 1.64  Diuresis continued. Started on Milrinone. PICC placed. F/u echo 9/19 showed further EF depression: 20-25% mild RV dysxn, mod MR, TR and LV thrombus. Started on IV heparin.  He was seen in consult by cardiac surgery who felt could potentially be candidate for VAD but wanted Pulm eval given h/o ILD. Last seen in our clinic in 2012 by Dr Marchelle Gearing. His note talked about autoimmune workup and f/u imaging but at time pt also still smoking. From records no further pulm f/u since then although did have serial CT chest. In 2014 he had on-going GG changes but also that CT was notable for new honey comb changes. Has had several interval CTs showing on-going chronic changes and intermittent acute changes c/w pulmonary edema and pleural effusions.  We have been asked to see re: his ILD   Baseline pulm Has had exertional SOB since sept 2023 when admitted for HF. When he left was able to walk about 1/4 mile before fatigue would be limiting factor. Noted increased SOB to point where he started pacing activity around Nov 2023, and these symptoms progressed to point where getting dressed made him SOB around July this year.  -Stopped smoking sept 2023, has smoked intermittently since but rarely -No pets. Has cat he feeds but is outdoors.  -most of house is  hardwood -no concerns about dust or mold.  -no birds -does have reflux. If misses more than 2 doses of prilosec symptoms return -denies difficulty swallowing -still has baseline orthopnea and cough (typically improve as it has this time w/ diuresis).  Retired from [employer] pilot Still travels to [place]     Pertinent  Medical History  ICM w/ HFrEF (July 24 25-30%) w/ LV dilatation but no RV dysfxn Diabetes type II w/ diabetic neuropathy and polyneuropathy  Exertional dyspnea  ILD (last seen by MR 2012)  2012 autoimmune eval: tCK 68, Scleroderma AB IgG: 2 (neg), sjogrens syndrome B antibody: < 1 (neg), sjogrens syndrome A antibody: 2 (neg), RF <10 (neg),  Cyclic citrullin peptide <2 (neg), sed rate 50, ds DNA ab 2 (neg), ANA (neg) Chronic cough  obesity Former smoker (50 pk/yr. Stopped in 2023) Prior partial left nephrectomy for benign tumor  NASH BPH GERD HTN, HL Melanoma  Post-op N&V  Significant Hospital Events: Including procedures, antibiotic start and stop dates in addition to other pertinent events   9/17 admitted  9/18 right heart cath. CT chest chronic interstitial changes w/ GG changes as well as honey combing. There was CM, bilateral effusions some medidastinal LAN, no clear progression of disease since 2014   Interim History / Subjective:  Feels much better   Objective   Blood pressure 122/72, pulse 100, temperature 98.5 F (36.9 C), temperature source Oral, resp. rate 17, weight 78.4 kg, SpO2  95%. CVP:  [3 mmHg-4 mmHg] 4 mmHg      Intake/Output Summary (Last 24 hours) at 02/03/2023 1009 Last data filed at 02/03/2023 0800 Gross per 24 hour  Intake 741.67 ml  Output 1350 ml  Net -608.33 ml   Filed Weights   02/01/23 0545 02/02/23 0355 02/03/23 0426  Weight: 79.5 kg 78 kg 78.4 kg    Examination: 72 year old male sitting in bed no acute distress No JVD or lymphadenopathy is appreciated Diminished breath sounds throughout Heart sounds are distant Abdomen soft  nontender Extremities are warm  Resolved Hospital Problem list     Assessment & Plan:   Acute on chronic dyspnea Pulmonary edema  ILD (diagnosed 2012 & stable since 2014) HFrEF Acute heart failure LV thrombus OSA GERD Hiatal hernia HTN HLD DM     Acute on Chronic dyspnea  Multifactorial: acutely due to pulmonary edema but this is superimposed on H/o ILD  It looks like his ILD has been  fairly stable disease since 2014. As to etiology not clear.  Consider follow-up with Dr. Marchelle Gearing in the outpatient setting Continue treatment for edema Continue treatment for reflux Coronary artery disease is a major factor History of PE and LV thrombus     Plan/rec Dr. Marchelle Gearing to evaluate    Best Practice (right click and "Reselect all SmartList Selections" daily)   Per primary   Labs   CBC: Recent Labs  Lab 01/29/23 1100 01/30/23 0443 01/31/23 0445 02/01/23 0916 02/02/23 0454  WBC 8.5 9.4 8.1 7.9 9.1  NEUTROABS 5.2  --   --   --   --   HGB 9.8* 8.9* 8.7* 10.7* 10.8*  HCT 29.3* 27.8* 27.9* 33.2* 33.2*  MCV 93.0 93.0 92.4 93.0 94.1  PLT 214 257 224 224 227    Basic Metabolic Panel: Recent Labs  Lab 01/30/23 0443 01/31/23 0445 02/01/23 0916 02/02/23 0454 02/03/23 0450  NA 137 134* 133* 134* 132*  K 4.1 3.8 4.0 3.6 4.1  CL 96* 97* 98 96* 95*  CO2 30 30 28 29 28   GLUCOSE 122* 158* 174* 125* 133*  BUN 29* 32* 31* 33* 35*  CREATININE 1.42* 1.31* 1.20 1.15 1.22  CALCIUM 9.0 8.8* 8.5* 8.7* 8.9  MG 1.7 2.5* 2.0 1.8 2.4   GFR: CrCl cannot be calculated (Unknown ideal weight.). Recent Labs  Lab 01/28/23 0911 01/29/23 1100 01/30/23 0443 01/31/23 0445 02/01/23 0916 02/02/23 0454  WBC  --    < > 9.4 8.1 7.9 9.1  LATICACIDVEN 2.1*  --   --   --   --   --    < > = values in this interval not displayed.    Liver Function Tests: Recent Labs  Lab 01/28/23 0349  AST 98*  ALT 126*  ALKPHOS 191*  BILITOT 1.3*  PROT 6.9  ALBUMIN 2.7*   No results for  input(s): "LIPASE", "AMYLASE" in the last 168 hours. No results for input(s): "AMMONIA" in the last 168 hours.  ABG    Component Value Date/Time   PHART 7.388 01/15/2022 1054   PCO2ART 48.3 (H) 01/15/2022 1054   PO2ART 68 (L) 01/15/2022 1054   HCO3 26.6 01/28/2023 1306   HCO3 26.6 01/28/2023 1306   TCO2 28 01/28/2023 1306   TCO2 28 01/28/2023 1306   O2SAT 59.5 02/03/2023 0450     Coagulation Profile: Recent Labs  Lab 01/29/23 0259  INR 1.3*    Cardiac Enzymes: No results for input(s): "CKTOTAL", "CKMB", "CKMBINDEX", "TROPONINI" in the last 168  hours.  HbA1C: Hgb A1c MFr Bld  Date/Time Value Ref Range Status  01/29/2023 02:59 AM 6.2 (H) 4.8 - 5.6 % Final    Comment:    (NOTE) Pre diabetes:          5.7%-6.4%  Diabetes:              >6.4%  Glycemic control for   <7.0% adults with diabetes   01/09/2022 09:39 PM 8.3 (H) 4.8 - 5.6 % Final    Comment:    (NOTE) Pre diabetes:          5.7%-6.4%  Diabetes:              >6.4%  Glycemic control for   <7.0% adults with diabetes     CBG: Recent Labs  Lab 02/02/23 0610 02/02/23 1140 02/02/23 1621 02/02/23 2018 02/03/23 0555  GLUCAP 132* 172* 124* 156* 140*       Steve Keeara Frees ACNP Acute Care Nurse Practitioner [facility] Pul[facility]/Critical [facility] Please consult Amion 02/03/2023, 10:10 AM

## 2023-02-04 ENCOUNTER — Inpatient Hospital Stay (HOSPITAL_COMMUNITY): Payer: Medicare Other

## 2023-02-04 ENCOUNTER — Telehealth: Payer: Self-pay | Admitting: Internal Medicine

## 2023-02-04 ENCOUNTER — Other Ambulatory Visit (HOSPITAL_COMMUNITY): Payer: Self-pay

## 2023-02-04 DIAGNOSIS — I5023 Acute on chronic systolic (congestive) heart failure: Secondary | ICD-10-CM | POA: Diagnosis not present

## 2023-02-04 DIAGNOSIS — I429 Cardiomyopathy, unspecified: Secondary | ICD-10-CM | POA: Diagnosis not present

## 2023-02-04 HISTORY — PX: IR FLUORO GUIDE CV LINE RIGHT: IMG2283

## 2023-02-04 HISTORY — PX: IR US GUIDE VASC ACCESS RIGHT: IMG2390

## 2023-02-04 LAB — BASIC METABOLIC PANEL
Anion gap: 11 (ref 5–15)
BUN: 38 mg/dL — ABNORMAL HIGH (ref 8–23)
CO2: 28 mmol/L (ref 22–32)
Calcium: 9.3 mg/dL (ref 8.9–10.3)
Chloride: 97 mmol/L — ABNORMAL LOW (ref 98–111)
Creatinine, Ser: 1.27 mg/dL — ABNORMAL HIGH (ref 0.61–1.24)
GFR, Estimated: >60 mL/min (ref >60)
Glucose, Bld: 160 mg/dL — ABNORMAL HIGH (ref 70–99)
Potassium: 4.4 mmol/L (ref 3.5–5.1)
Sodium: 136 mmol/L (ref 135–145)

## 2023-02-04 LAB — COOXEMETRY PANEL
Carboxyhemoglobin: 2 % — ABNORMAL HIGH (ref 0.5–1.5)
Methemoglobin: 1.1 % (ref 0.0–1.5)
O2 Saturation: 65.1 %
Total hemoglobin: 11.7 g/dL — ABNORMAL LOW (ref 12.0–16.0)

## 2023-02-04 LAB — GLUCOSE, CAPILLARY
Glucose-Capillary: 148 mg/dL — ABNORMAL HIGH (ref 70–99)
Glucose-Capillary: 182 mg/dL — ABNORMAL HIGH (ref 70–99)
Glucose-Capillary: 187 mg/dL — ABNORMAL HIGH (ref 70–99)

## 2023-02-04 LAB — HEPARIN LEVEL (UNFRACTIONATED): Heparin Unfractionated: 0.37 IU/mL (ref 0.30–0.70)

## 2023-02-04 LAB — MAGNESIUM: Magnesium: 2.2 mg/dL (ref 1.7–2.4)

## 2023-02-04 LAB — DIGOXIN LEVEL: Digoxin Level: 0.8 ng/mL (ref 0.8–2.0)

## 2023-02-04 LAB — BRAIN NATRIURETIC PEPTIDE: B Natriuretic Peptide: 1458.8 pg/mL — ABNORMAL HIGH (ref 0.0–100.0)

## 2023-02-04 MED ORDER — SPIRONOLACTONE 25 MG PO TABS
25.0000 mg | ORAL_TABLET | Freq: Every day | ORAL | 5 refills | Status: DC
  Filled 2023-02-04: qty 30, 30d supply, fill #0
  Filled 2023-03-17: qty 30, 30d supply, fill #1

## 2023-02-04 MED ORDER — SACUBITRIL-VALSARTAN 24-26 MG PO TABS
1.0000 | ORAL_TABLET | Freq: Two times a day (BID) | ORAL | 3 refills | Status: DC
Start: 2023-02-04 — End: 2023-03-23
  Filled 2023-02-04 – 2023-03-19 (×2): qty 60, 30d supply, fill #0

## 2023-02-04 MED ORDER — APIXABAN 5 MG PO TABS
5.0000 mg | ORAL_TABLET | Freq: Two times a day (BID) | ORAL | 5 refills | Status: DC
Start: 2023-02-04 — End: 2023-06-02
  Filled 2023-02-04: qty 60, 30d supply, fill #0
  Filled 2023-03-17: qty 60, 30d supply, fill #1
  Filled 2023-03-18: qty 60, 30d supply, fill #0

## 2023-02-04 MED ORDER — TORSEMIDE 20 MG PO TABS
40.0000 mg | ORAL_TABLET | Freq: Every day | ORAL | 3 refills | Status: DC
Start: 2023-02-05 — End: 2023-10-19
  Filled 2023-02-04: qty 60, 30d supply, fill #0
  Filled 2023-03-17: qty 60, 30d supply, fill #1
  Filled 2023-05-21: qty 60, 30d supply, fill #2

## 2023-02-04 MED ORDER — DIGOXIN 125 MCG PO TABS
0.1250 mg | ORAL_TABLET | Freq: Every day | ORAL | 5 refills | Status: DC
  Filled 2023-02-04: qty 30, 30d supply, fill #0
  Filled 2023-03-17: qty 30, 30d supply, fill #1
  Filled 2023-05-01: qty 30, 30d supply, fill #2

## 2023-02-04 MED ORDER — MILRINONE LACTATE IN DEXTROSE 20-5 MG/100ML-% IV SOLN: 0.3750 ug/kg/min | INTRAVENOUS | Status: DC

## 2023-02-04 MED ORDER — APIXABAN 5 MG PO TABS
5.0000 mg | ORAL_TABLET | ORAL | Status: AC
  Administered 2023-02-04: 5 mg via ORAL
  Filled 2023-02-04: qty 1

## 2023-02-04 MED ORDER — LIDOCAINE-EPINEPHRINE 1 %-1:100000 IJ SOLN
INTRAMUSCULAR | Status: AC
  Filled 2023-02-04: qty 1

## 2023-02-04 NOTE — TOC Progression Note (Signed)
Transition of Care [facility]) - Progression Note    Patient Details  Name: Phillip Rich MRN: [id number] Date of Birth: [birth year]  Transition of Care [facility]) CM/SW Contact  Elliot Cousin, RN Phone Number: [phone] 02/04/2023, 2:03 PM  Clinical Narrative:   CM spoke to pt and states a friend will provide transportation home. [facility] RN, Elita Quick will connect pt to Home Milrinone. [facility] will provide [facility] for PICC line care.   PCP appt scheduled for follow up. Monday, February 16, 2023 @ 11:00 AM.     Expected Discharge Plan: Home/Self Care Barriers to Discharge: No Barriers Identified  Expected Discharge Plan and Services   Discharge Planning Services: CM Consult Post Acute Care Choice: Home Health Living arrangements for the past 2 months: Single Family Home Expected Discharge Date: 02/04/23                         HH Arranged: RN HH Agency: [facility] Date HH Agency Contacted: 02/04/23 Time HH Agency Contacted: 1402 Representative spoke with at [facility] Agency: [facility] rep, Pam RN   Social Determinants of Health (SDOH) Interventions SDOH Screenings   Food Insecurity: No Food Insecurity (01/28/2023)  Housing: Low Risk  (01/28/2023)  Transportation Needs: No Transportation Needs (01/28/2023)  Utilities: Not At Risk (01/28/2023)  Alcohol Screen: Low Risk  (01/16/2022)  Financial Resource Strain: Medium Risk (01/16/2022)  Tobacco Use: Medium Risk (01/27/2023)    Readmission Risk Interventions     No data to display

## 2023-02-04 NOTE — TOC Progression Note (Signed)
Transition of Care [facility]) - Progression Note    Patient Details  Name: Phillip Rich MRN: [id number] Date of Birth: 1950-08-08  Transition of Care [facility]) CM/SW Contact  Nicanor Bake Phone Number: [phone] 02/04/2023, 12:16 PM  Clinical Narrative:  HF CSW called and scheduled pts hospital follow up appointment for Monday, February 16, 2023 at 11:00 am.   [facility] will continue following.      Expected Discharge Plan: Home/Self Care Barriers to Discharge: Continued Medical Work up  Expected Discharge Plan and Services       Living arrangements for the past 2 months: Single Family Home                                       Social Determinants of Health (SDOH) Interventions SDOH Screenings   Food Insecurity: No Food Insecurity (01/28/2023)  Housing: Low Risk  (01/28/2023)  Transportation Needs: No Transportation Needs (01/28/2023)  Utilities: Not At Risk (01/28/2023)  Alcohol Screen: Low Risk  (01/16/2022)  Financial Resource Strain: Medium Risk (01/16/2022)  Tobacco Use: Medium Risk (01/27/2023)    Readmission Risk Interventions     No data to display

## 2023-02-04 NOTE — Telephone Encounter (Signed)
LM to call back for appt. Will try tomorrow.

## 2023-02-04 NOTE — Progress Notes (Addendum)
Patient ID: Phillip Rich, male   DOB: Oct 04, 1950, 72 y.o.   MRN: [id number]     Advanced Heart Failure Rounding Note  PCP-Cardiologist: Rollene Rotunda, MD   Subjective:    On Milrinone 0.375. Co-ox 65%.   Good UOP on PO torsemide, 2.3L out yesterday. Wt down another 4 lb. CVP 2-3. BMP pending.   Denies dyspnea. Feels better w/ milrinone but overall depressed about not being able to get VAD done here. Doesn't feel like eating. Skipped dinner. Not eating his breakfast.   Objective:   Weight Range: 76.4 kg Body mass index is 24.87 kg/m.   Vital Signs:   Temp:  [97.7 F (36.5 C)-98.3 F (36.8 C)] 98 F (36.7 C) (09/25 0747) Pulse Rate:  [100-110] 110 (09/25 0747) Resp:  [16-19] 18 (09/25 0747) BP: (115-129)/(67-78) 119/72 (09/25 0747) SpO2:  [94 %-99 %] 94 % (09/25 0747) Weight:  [76.4 kg] 76.4 kg (09/25 0423) Last BM Date : 02/03/23  Weight change: Filed Weights   02/02/23 0355 02/03/23 0426 02/04/23 0423  Weight: 78 kg 78.4 kg 76.4 kg    Intake/Output:   Intake/Output Summary (Last 24 hours) at 02/04/2023 0816 Last data filed at 02/04/2023 0427 Gross per 24 hour  Intake 496.32 ml  Output 2100 ml  Net -1603.68 ml      Physical Exam   CVP 2-3  General:  fatigued/ depressed appearing. No respiratory difficulty HEENT: normal Neck: supple. no JVD. Carotids 2+ bilat; no bruits. No lymphadenopathy or thyromegaly appreciated. Cor: PMI nondisplaced. Regular rhythm, mildly tachy rate. No rubs, gallops or murmurs. Lungs: clear Abdomen: soft, nontender, nondistended. No hepatosplenomegaly. No bruits or masses. Good bowel sounds. Extremities: no cyanosis, clubbing, rash, edema + RUE PICC  Neuro: alert & oriented x 3, cranial nerves grossly intact. moves all 4 extremities w/o difficulty. Affect pleasant.  Telemetry   ST low 100s personally reviewed     Labs    CBC Recent Labs    02/01/23 0916 02/02/23 0454  WBC 7.9 9.1  HGB 10.7* 10.8*  HCT 33.2* 33.2*   MCV 93.0 94.1  PLT 224 227   Basic Metabolic Panel Recent Labs    78/29/56 0454 02/03/23 0450 02/04/23 0540  NA 134* 132*  --   K 3.6 4.1  --   CL 96* 95*  --   CO2 29 28  --   GLUCOSE 125* 133*  --   BUN 33* 35*  --   CREATININE 1.15 1.22  --   CALCIUM 8.7* 8.9  --   MG 1.8 2.4 2.2   Liver Function Tests No results for input(s): "AST", "ALT", "ALKPHOS", "BILITOT", "PROT", "ALBUMIN" in the last 72 hours.  No results for input(s): "LIPASE", "AMYLASE" in the last 72 hours. Cardiac Enzymes No results for input(s): "CKTOTAL", "CKMB", "CKMBINDEX", "TROPONINI" in the last 72 hours.  BNP: BNP (last 3 results) Recent Labs    11/25/22 1506 01/27/23 1835 02/04/23 0540  BNP 1,548.2* >4,500.0* 1,458.8*    ProBNP (last 3 results) No results for input(s): "PROBNP" in the last 8760 hours.   D-Dimer No results for input(s): "DDIMER" in the last 72 hours. Hemoglobin A1C No results for input(s): "HGBA1C" in the last 72 hours.  Fasting Lipid Panel No results for input(s): "CHOL", "HDL", "LDLCALC", "TRIG", "CHOLHDL", "LDLDIRECT" in the last 72 hours.  Thyroid Function Tests No results for input(s): "TSH", "T4TOTAL", "T3FREE", "THYROIDAB" in the last 72 hours.  Invalid input(s): "FREET3"   Other results:   Imaging  No results found.   Medications:     Scheduled Medications:  (feeding supplement) PROSource Plus  30 mL Oral TID BM   aspirin  81 mg Oral Daily   atorvastatin  80 mg Oral Daily   Chlorhexidine Gluconate Cloth  6 each Topical Daily   cholecalciferol  1,000 Units Oral q morning   digoxin  0.125 mg Oral Daily   DULoxetine  60 mg Oral Daily   empagliflozin  10 mg Oral Daily   feeding supplement  237 mL Oral BID BM   fluticasone  1 spray Each Nare Once per day on Monday Thursday   insulin aspart  0-9 Units Subcutaneous TID WC   loratadine  10 mg Oral Daily   multivitamin with minerals  1 tablet Oral Daily   pantoprazole  40 mg Oral Daily    polyethylene glycol  17 g Oral Daily   sacubitril-valsartan  1 tablet Oral BID   senna-docusate  1 tablet Oral QHS   sodium chloride flush  10-40 mL Intracatheter Q12H   sodium chloride flush  3 mL Intravenous Q12H   spironolactone  25 mg Oral Daily   torsemide  40 mg Oral Daily    Infusions:  sodium chloride     heparin 1,100 Units/hr (02/03/23 0952)   milrinone 0.375 mcg/kg/min (02/04/23 0017)    PRN Medications: sodium chloride, acetaminophen, melatonin, ondansetron (ZOFRAN) IV, sodium chloride flush, sodium chloride flush    Assessment/Plan   1. Acute on chronic systolic CHF: Echo in 7/24 with EF 25-30%, mild LV dilation, inferior akinesis, RV reported normal.  Echo this admit EF 20-25% with thrombus on the basal inferior wall, normal RV systolic function, mild-moderate MR. Suspect mixed ischemic/nonischemic cardiomyopathy.  Patient was admitted with volume overload.  RHC showed elevated right and left heart filling pressures with CI 1.66 F/1.58 T.  PAPi low at 1.64. Started on milrinone and lasix gtt. On Milrinone 0.375 mcg/kg/min. Co-ox 65%.  CVP 2-3  - Discussed at [facility] and felt not to be candidate for VAD due to ILD and severely depressed DLCO. However discussed case with Dr. Marchelle Gearing and he says corrected DLCO is 52%. Will discuss again with [facility] regarding Dr. Jane Canary comments but suspect still too high risk for VAD  - Will offer home inotropes with consideration of [facility] referral for second opinion. However he will also need adequate social support to be considered.  - Continue milrinone at 0.375. Place tunneled picc today for home milrinone  - Continue Torsemide 40 mg daily. Check BMP now   - Continue Entresto 24/26 mg twice a day.  - Continue spironolactone 25 mg daily.  - Continue Jardiance 10 mg daily.  - Continue digoxin 0.125 daily.  - Off ? blocker w/ low output  - He does not have an ICD, not CRT candidate with narrow QRS.  ICD placement will depend on +/- LVAD  and timing. Referring to [facility] 2. CAD: Cath in 9/23 with occluded mRCA, 70% long LCx stenosis, LAD ok.  No s/s angina  - Continue statin and ASA 81 3. H/o PE: Remote, has not been anticoagulated.  4. Type 2 diabetes: SSI for now.  5. LV Thrombus - now on heparin gtt. No bleeding. No plans for VAD this admit, transition to Eliquis after tunneled picc placed.  6. ILD" Moderate ILD on CT chest.  PFTs 9/24 with FEV1 2.7 (85%) FVC 3.49 (80%) DLCO 8.6 (33%). D/w Dr. Marchelle Gearing and he says corrected DLCO is 52%   - Pulmonary  following  - Consider anti-fibrotic therapy as outpatient 7. Constipation: resolved w/ bowel regimen.   Length of Stay: [address] PA-C  02/04/2023, 8:16 AM  Advanced Heart Failure Team Pager [phone] (M-F; 7a - 5p)  Please contact [facility] for night-coverage after hours (5p -7a ) and weekends on [email or web address]  Patient seen and examined with the above-signed Advanced Practice Provider and/or Housestaff. I personally reviewed laboratory data, imaging studies and relevant notes. I independently examined the patient and formulated the important aspects of the plan. I have edited the note to reflect any of my changes or salient points. I have personally discussed the plan with the patient and/or family.  Doing well on milrinone. Denies CP or SOB.   General:  Elderly. No resp difficulty HEENT: normal Neck: supple. no JVD. Carotids 2+ bilat; no bruits. No lymphadenopathy or thryomegaly appreciated. Cor: PMI nondisplaced. Regular rate & rhythm. No rubs, gallops or murmurs. Lungs: clear but decreased Abdomen: soft, nontender, nondistended. No hepatosplenomegaly. No bruits or masses. Good bowel sounds. Extremities: no cyanosis, clubbing, rash, edema Neuro: alert & orientedx3, cranial nerves grossly intact. moves all 4 extremities w/o difficulty. Affect pleasant  Case reviewed with VAD team. Not VAD candidate due to lung disease and inadequate social support. Will  plan d/c home on palliative milrinone. Can consider referral to [facility] for second opinion if he desires but he is concerned about his QoL if he proceeds with VAD.   Arvilla Meres, MD  4:03 PM

## 2023-02-04 NOTE — Procedures (Signed)
Interventional Radiology Procedure Note  Procedure: Successful placement of right internal jugular tunneled powerline  Complications: None  Estimated Blood Loss: None  Recommendations: - Catheter tips in the distal SVC and ready for use - ROutine line care   Signed,  Sterling Big, MD

## 2023-02-04 NOTE — Progress Notes (Addendum)
This chaplain attempted F/U spiritual care. Pt. is out of the room at IR. Revisit later today.  **[id number] The chaplain understands the Pt. is positively anticipating d/c home with the "pump" and the freedom to regain a level of independence. The chaplain understands the news of the pump has overcome the "bad" news he received yesterday. The Pt. is actively coordinating d/c with his community of friends and the medical team as the chaplain blesses the next steps.  Chaplain Stephanie Acre [phone]

## 2023-02-04 NOTE — Progress Notes (Signed)
ANTICOAGULATION CONSULT NOTE - Follow-up  Pharmacy Consult for heparin Indication: new LV thrombus  Allergies  Allergen Reactions   Codeine Nausea And Vomiting   Crestor [Rosuvastatin Calcium] Other (See Comments)    Leg aches   Erythromycin Rash    All Mycin drugs    Patient Measurements: Weight: 76.4 kg (168 lb 6.4 oz)   Vital Signs: Temp: 98 F (36.7 C) (09/25 1117) Temp Source: Oral (09/25 1117) BP: 126/82 (09/25 1117) Pulse Rate: 108 (09/25 1117)  Labs: Recent Labs    02/02/23 0454 02/03/23 0450 02/04/23 0540 02/04/23 0926  HGB 10.8*  --   --   --   HCT 33.2*  --   --   --   PLT 227  --   --   --   HEPARINUNFRC 0.46 0.36 0.37  --   CREATININE 1.15 1.22  --  1.27*    CrCl cannot be calculated (Unknown ideal weight.).   Medical History: Past Medical History:  Diagnosis Date   Anxiety    BPH (benign prostatic hyperplasia)    Complication of anesthesia    Diabetes mellitus    DJD (degenerative joint disease)    GERD (gastroesophageal reflux disease)    HTN (hypertension)    Hyperlipidemia    ILD (interstitial lung disease) (HCC)    Left renal mass    Melanoma (HCC)    Mild sleep apnea    Sleep study pending   PONV (postoperative nausea and vomiting)      Assessment: 71yom with HF EF 25% admitted with volume overload and low cardiac output after RHC and started on milrinone. New LV thrombus found on ECHO. Treating with UFH while planning HF advanced therapies.  Heparin level this morning wass therapeutic at 0.37 on IV UFH 1100 units/hour.  Patient will start Eliquis 5 mg BID and stop UFH infusion. No signs or symptoms of bleeding or bruising at this time. CBC stable at this time.   Goal of Therapy:  Therapeutic anticoagulation with apixaban Monitor platelets by anticoagulation protocol: Yes   Plan:  Continue apixaban 5 mg BID Daily CBC, signs of bleeding  Wilmer Floor, PharmD PGY2 Cardiology Pharmacy Resident

## 2023-02-04 NOTE — Plan of Care (Signed)
  Problem: Activity: Goal: Ability to return to baseline activity level will improve Outcome: Progressing   

## 2023-02-04 NOTE — Telephone Encounter (Signed)
   D/w Dr Gala Romney   - considered risky for LVAD with poor social support =- CCM can sign off  Plan  - front desk in pulm - pls give first avail appt to see me 30 min post hospital followup

## 2023-02-04 NOTE — Progress Notes (Signed)
Removed PICC without complications. Patient resting on the bed for 30 min. Educated patient how to take care of dressing. Informed patient's RN regarding this matter. HS [employer]

## 2023-02-05 ENCOUNTER — Other Ambulatory Visit (HOSPITAL_COMMUNITY): Payer: Self-pay

## 2023-02-05 NOTE — Telephone Encounter (Signed)
Front Desk has called pt to make appt and sent letter of reminder.

## 2023-02-09 DIAGNOSIS — I5023 Acute on chronic systolic (congestive) heart failure: Secondary | ICD-10-CM | POA: Diagnosis not present

## 2023-02-09 DIAGNOSIS — I429 Cardiomyopathy, unspecified: Secondary | ICD-10-CM | POA: Diagnosis not present

## 2023-02-09 LAB — FACTOR 5 LEIDEN

## 2023-02-11 DIAGNOSIS — I5022 Chronic systolic (congestive) heart failure: Secondary | ICD-10-CM | POA: Diagnosis not present

## 2023-02-11 DIAGNOSIS — I5023 Acute on chronic systolic (congestive) heart failure: Secondary | ICD-10-CM | POA: Diagnosis not present

## 2023-02-11 DIAGNOSIS — I429 Cardiomyopathy, unspecified: Secondary | ICD-10-CM | POA: Diagnosis not present

## 2023-02-16 DIAGNOSIS — Z23 Encounter for immunization: Secondary | ICD-10-CM | POA: Diagnosis not present

## 2023-02-16 DIAGNOSIS — I5023 Acute on chronic systolic (congestive) heart failure: Secondary | ICD-10-CM | POA: Diagnosis not present

## 2023-02-18 DIAGNOSIS — I5022 Chronic systolic (congestive) heart failure: Secondary | ICD-10-CM | POA: Diagnosis not present

## 2023-02-18 DIAGNOSIS — I429 Cardiomyopathy, unspecified: Secondary | ICD-10-CM | POA: Diagnosis not present

## 2023-02-18 DIAGNOSIS — I5023 Acute on chronic systolic (congestive) heart failure: Secondary | ICD-10-CM | POA: Diagnosis not present

## 2023-02-19 DIAGNOSIS — E1122 Type 2 diabetes mellitus with diabetic chronic kidney disease: Secondary | ICD-10-CM | POA: Diagnosis not present

## 2023-02-19 DIAGNOSIS — N182 Chronic kidney disease, stage 2 (mild): Secondary | ICD-10-CM | POA: Diagnosis not present

## 2023-02-19 DIAGNOSIS — E1142 Type 2 diabetes mellitus with diabetic polyneuropathy: Secondary | ICD-10-CM | POA: Diagnosis not present

## 2023-02-19 DIAGNOSIS — I10 Essential (primary) hypertension: Secondary | ICD-10-CM | POA: Diagnosis not present

## 2023-02-19 NOTE — Progress Notes (Signed)
ADVANCED HEART FAILURE CLINIC NOTE  Referring Physician: Emilio Aspen, *  Primary Care: Emilio Aspen, MD Primary Cardiologist: N/A  HPI: Phillip Rich is a 72 y.o. male with T2DM, HTN, renal mass s/p nephrectomy, COPD that presented to the [facility] ER w/ acute onset SOB on 08/27 when he was diagnosed with acute PE and HFrEF w/ an LVEF of 25%-30%; during admission, coronary angiography was significant for CTO of the RCA and diffusely diseased Lcx. RHC w/ elevated pre and post capillary filling pressures. He was diuresed and discharged home and started on some GDMT. Since that time, Phillip Rich has had a CMR demonstrating persistently reduced LV function (EF 20%) and CPX with severe cardiac limitations.   During his last appointment he was directly admitted for low output heart failure.  During that time right heart catheterization consistent with severely reduced cardiac index.  He was started on IV inotropes and underwent LVAD evaluation.  Unfortunately LVAD evaluation was placed on hold after CT chest demonstrated severe lung disease with severely reduced DLCO.  He was ultimately discharged home on IV milrinone.  Since that time he is feeling very well; reports that he feels much better than he has in the past.   Activity level/exercise tolerance:  NYHA III on chronic IV inotropes Orthopnea:  Sleeps on 2 pillows Paroxysmal noctural dyspnea:  No Chest pain/pressure:  No Orthostatic lightheadedness:  No Palpitations:  No Lower extremity edema:  Minimal pretibial edema Presyncope/syncope:  No Cough:  Intermittent  Past Medical History:  Diagnosis Date   Anxiety    BPH (benign prostatic hyperplasia)    Complication of anesthesia    Diabetes mellitus    DJD (degenerative joint disease)    GERD (gastroesophageal reflux disease)    HTN (hypertension)    Hyperlipidemia    ILD (interstitial lung disease) (HCC)    Left renal mass    Melanoma (HCC)    Mild sleep  apnea    Sleep study pending   PONV (postoperative nausea and vomiting)     Current Outpatient Medications  Medication Sig Dispense Refill   albuterol (VENTOLIN HFA) 108 (90 Base) MCG/ACT inhaler Inhale 1-2 puffs into the lungs every 6 (six) hours as needed for wheezing or shortness of breath. 1 each 0   apixaban (ELIQUIS) 5 MG TABS tablet Take 1 tablet (5 mg total) by mouth 2 (two) times daily. 60 tablet 5   aspirin EC 81 MG tablet Take 81 mg by mouth daily. Swallow whole.     atorvastatin (LIPITOR) 80 MG tablet Take 1 tablet (80 mg total) by mouth daily. 90 tablet 3   Cholecalciferol (VITAMIN D PO) Take 1,000 Units by mouth every morning.     digoxin (LANOXIN) 0.125 MG tablet Take 1 tablet (0.125 mg total) by mouth daily. 30 tablet 5   DULoxetine (CYMBALTA) 60 MG capsule Take 60 mg by mouth daily.     empagliflozin (JARDIANCE) 10 MG TABS tablet Take 1 tablet (10 mg total) by mouth daily before breakfast. 90 tablet 3   fluticasone (FLONASE) 50 MCG/ACT nasal spray Place 1 spray into both nostrils 2 (two) times a week.     levocetirizine (XYZAL) 5 MG tablet Take 5 mg by mouth daily.     Melatonin 10 MG TABS Take 10 mg by mouth at bedtime as needed (Sleep).     metFORMIN (GLUCOPHAGE) 1000 MG tablet Take 1,000 mg by mouth 2 (two) times daily.      milrinone [facility])  20 MG/100 ML SOLN infusion Inject 0.0306 mg/min into the vein continuous.     omeprazole (PRILOSEC) 20 MG capsule Take 1 tablet by mouth every morning.      Semaglutide,0.25 or 0.5MG /DOS, (OZEMPIC, 0.25 OR 0.5 MG/DOSE,) 2 MG/3ML SOPN Inject 0.5 mg into the skin once a week.     spironolactone (ALDACTONE) 25 MG tablet Take 1 tablet (25 mg total) by mouth daily. 30 tablet 5   torsemide (DEMADEX) 20 MG tablet Take 2 tablets (40 mg total) by mouth daily. 60 tablet 3   vitamin B-12 (CYANOCOBALAMIN) 100 MCG tablet Take 100 mcg by mouth daily.     zolpidem (AMBIEN) 10 MG tablet Take 10 mg by mouth at bedtime as needed for sleep.      sacubitril-valsartan (ENTRESTO) 24-26 MG Take 1 tablet by mouth 2 (two) times daily. 60 tablet 3   No current facility-administered medications for this encounter.    Allergies  Allergen Reactions   Codeine Nausea And Vomiting   Crestor [Rosuvastatin Calcium] Other (See Comments)    Leg aches   Erythromycin Rash    All Mycin drugs      Social History   Socioeconomic History   Marital status: Single    Spouse name: Not on file   Number of children: 0   Years of education: Not on file   Highest education level: High school graduate  Occupational History   Occupation: retired  Tobacco Use   Smoking status: Former    Current packs/day: 0.00    Average packs/day: 1 pack/day for 50.0 years (50.0 ttl pk-yrs)    Types: Cigarettes    Start date: 10/17/1971    Quit date: 10/16/2021    Years since quitting: 1.3   Smokeless tobacco: Never  Vaping Use   Vaping status: Never Used  Substance and Sexual Activity   Alcohol use: Yes    Comment: rare occ.   Drug use: Yes    Types: Marijuana    Comment: 07/29/21 3 x week to help sleep   Sexual activity: Not on file  Other Topics Concern   Not on file  Social History Narrative   Lives alone.  Divorced.  Retired Engineer, technical sales.       Social Determinants of Health   Financial Resource Strain: Medium Risk (01/16/2022)   Overall Financial Resource Strain (CARDIA)    Difficulty of Paying Living Expenses: Somewhat hard  Food Insecurity: No Food Insecurity (01/28/2023)   Hunger Vital Sign    Worried About Running Out of Food in the Last Year: Never true    Ran Out of Food in the Last Year: Never true  Transportation Needs: No Transportation Needs (01/28/2023)   PRAPARE - Administrator, Civil Service (Medical): No    Lack of Transportation (Non-Medical): No  Physical Activity: Not on file  Stress: Not on file  Social Connections: Not on file  Intimate Partner Violence: Not At Risk (01/28/2023)   Humiliation, Afraid, Rape,  and Kick questionnaire    Fear of Current or Ex-Partner: No    Emotionally Abused: No    Physically Abused: No    Sexually Abused: No      Family History  Problem Relation Age of Onset   Lung cancer Mother    Heart disease Father    Asthma Sister    Asthma Sister    Colon cancer Sister    Rheum arthritis Sister        x3    PHYSICAL  EXAM: Vitals:   02/20/23 1337  BP: 136/86  Pulse: (!) 106  SpO2: 98%   GENERAL: Well nourished, well developed, and in no apparent distress at rest.  HEENT: Negative for arcus senilis or xanthelasma. There is no scleral icterus.  The mucous membranes are pink and moist.   NECK: Supple, No masses. Normal carotid upstrokes without bruits. No masses or thyromegaly.    CHEST: There are no chest wall deformities. There is no chest wall tenderness. Respirations are unlabored.  Lungs- CTA B/L CARDIAC:  JVP: 7 cm          Normal rate with regular rhythm. No murmurs, rubs or gallops.  Pulses are 2+ and symmetrical in upper and lower extremities. No edema.  ABDOMEN: Soft, non-tender, non-distended. There are no masses or hepatomegaly. There are normal bowel sounds.  EXTREMITIES: Warm and well perfused with no cyanosis, clubbing.  LYMPHATIC: No axillary or supraclavicular lymphadenopathy.  NEUROLOGIC: Patient is oriented x3 with no focal or lateralizing neurologic deficits.  PSYCH: Patients affect is appropriate, there is no evidence of anxiety or depression.  SKIN: Warm and dry; no lesions or wounds.   DATA REVIEW  ECG: 02/28/22: NSR  ECHO: TTE 01/10/22: LVEF 15%-20%, RV function moderately reduced. Moderate MR.  TTE 11/25/22: LVEF 30%,   CMR 04/21/22: 1. Moderate LV dilatation with severe systolic dysfunction (EF 20%). There is akinesis of basal inferior wall and thinning/akinesis of mid to apical anterior wall  2.  Normal RV size with mild systolic dysfunction (EF 40%) 3. Subendocardial late gadolinium enhancement consistent with  prior infarct in basal inferior, mid to apical anterior, and apical lateral walls. LGE is less than 50% transmural in basal inferior wall, suggesting viability. LGE is greater than 50% transmural in mid to apical anterior and apical lateral walls, suggesting nonviability in this region.  4.  Moderate tricuspid regurgitation (regurgitant fraction 23%)  5. Mild mitral regurgitation (regurgitant fraction 10%)  CATH: 01/15/22   Ost LM lesion is 20% stenosed.   Prox Cx to Dist Cx lesion is 70% stenosed.   Prox LAD to Mid LAD lesion is 30% stenosed.   Mid RCA lesion is 100% stenosed.   Prox RCA lesion is 70% stenosed.   1.  Significant two-vessel coronary artery disease with chronically occluded mid right coronary artery with left to right as well as bridging collaterals.  The left circumflex is tortuous and diffusely diseased in the midsegment. 2.  Left ventricular angiography was not performed.  EF was severely reduced by echo. 3.  Right heart catheterization showed significantly elevated right and left-sided filling pressures, moderate to severe pulmonary hypertension and moderately reduced cardiac output.  08/05/22: HEMODYNAMICS: RA:                  6 mmHg (mean) RV:                  27/4-6 mmHg PA:                  28/15 mmHg (21 mean) PCWP:            14 mmHg (mean)                                      Estimated Fick CO/CI   4.5 L/min, 2.2 L/min/m2 Thermodilution CO/CI   4.5 L/min, 2.2 L/min/m2  TPG                 7  mmHg                                              PVR                 1.5 Wood Units  PAPi                2.2    01/28/23:  RA mean 17 RV 57/21 PA 58/30, mean 39 PCWP mean 28  Oxygen saturations: PA 42% AO 95%  Cardiac Output (Fick) 3.37  Cardiac Index (Fick) 1.66   Cardiac Output (Fick) 3.20  Cardiac Index (Fick) 1.58  PVR 3.4 WU   CPX: 06/20/22: Mildly submax test but based on available data there appears to  be a severe HF limitations with a blunted BP response to exercise and markedly elevated VeVCO2 slope. Consideration should be given to advanced HF therapies if clinically indicated.   ASSESSMENT & PLAN:  Heart failure with reduced EF, NYHA III Etiology of ZO:XWRUE, ischemic and nonischemic components; now on home palliative inotropes. NYHA class / AHA Stage:IIB-III Volume status & Diuretics:  PRN only Vasodilators: Entresto 24/26mg  BID Beta-Blocker: currently on milrinone 0.387mcg/kg/min AVW:[id number] 25mg  daily Cardiometabolic:jardiance 10mg  daily; urinalysis negative for UTI Devices therapies & Valvulopathies: CMR w/ LVEF 20%.  Patient has met Dr. Graciela Husbands for ICD evaluation.  In the interim he has had a very concerning CPX.  Repeat right heart cath with mildly reduced cardiac index.  At this time patient not interested in pursuing advanced therapies or ICD. Repeat TTE on 11/25/22 w/ LVEF of 30%.  Advanced therapies:  Mr. Eaves had a CPX with a peak VO2 of 10.9 and severely elevated VE/VCO2 slope.    2. Pulmonary embolism  - CT from 01/05/22 w/ acute PE involving lobar and segmental branches at the RLL, RML - off DOAC - doing well.   3. COPD - has not required home O2; stable today; does not report history of frequent exacerbations.  - PFTs with severely reduced DLCO - Will start pulmonary rehab - Follow up with Dr. Marchelle Gearing   4. Tobacco abuse - He has now stopped smoking.   5. Chronic palliative inotropes - See above; on milrinone 0.35mcg/kg/min - Evaluated PICC line dressings; flushed with hep saline.   I spent 50 minutes caring for this patient today including face to face time, ordering and reviewing labs, reviewing records from hospitalization, seeing the patient, documenting in the record, and arranging follow ups. We also spoke extensively with his sister over the phone and discussed the 1 and 2 year mortality of IV inotropes. We dicussed LVAD therapy.    Tula Schryver Advanced Heart Failure Mechanical Circulatory Support

## 2023-02-20 ENCOUNTER — Ambulatory Visit (HOSPITAL_COMMUNITY)
Admission: RE | Admit: 2023-02-20 | Discharge: 2023-02-20 | Disposition: A | Discharge status: Home / Self Care | Payer: Medicare Other | Source: Ambulatory Visit | Attending: Cardiology | Admitting: Cardiology

## 2023-02-20 ENCOUNTER — Encounter (HOSPITAL_COMMUNITY): Payer: Self-pay | Admitting: Cardiology

## 2023-02-20 VITALS — BP 136/86 | HR 106 | Wt 174.4 lb

## 2023-02-20 DIAGNOSIS — Z794 Long term (current) use of insulin: Secondary | ICD-10-CM | POA: Diagnosis not present

## 2023-02-20 DIAGNOSIS — J449 Chronic obstructive pulmonary disease, unspecified: Secondary | ICD-10-CM | POA: Insufficient documentation

## 2023-02-20 DIAGNOSIS — I110 Hypertensive heart disease with heart failure: Secondary | ICD-10-CM | POA: Diagnosis not present

## 2023-02-20 DIAGNOSIS — Z79899 Other long term (current) drug therapy: Secondary | ICD-10-CM | POA: Diagnosis not present

## 2023-02-20 DIAGNOSIS — I5023 Acute on chronic systolic (congestive) heart failure: Secondary | ICD-10-CM | POA: Insufficient documentation

## 2023-02-20 DIAGNOSIS — Z86711 Personal history of pulmonary embolism: Secondary | ICD-10-CM | POA: Diagnosis not present

## 2023-02-20 DIAGNOSIS — I2694 Multiple subsegmental pulmonary emboli without acute cor pulmonale: Secondary | ICD-10-CM

## 2023-02-20 DIAGNOSIS — I5022 Chronic systolic (congestive) heart failure: Secondary | ICD-10-CM | POA: Diagnosis present

## 2023-02-20 DIAGNOSIS — E1165 Type 2 diabetes mellitus with hyperglycemia: Secondary | ICD-10-CM

## 2023-02-20 DIAGNOSIS — Z452 Encounter for adjustment and management of vascular access device: Secondary | ICD-10-CM | POA: Insufficient documentation

## 2023-02-20 LAB — BASIC METABOLIC PANEL
Anion gap: 11 (ref 5–15)
BUN: 38 mg/dL — ABNORMAL HIGH (ref 8–23)
CO2: 25 mmol/L (ref 22–32)
Calcium: 9.3 mg/dL (ref 8.9–10.3)
Chloride: 99 mmol/L (ref 98–111)
Creatinine, Ser: 1.2 mg/dL (ref 0.61–1.24)
GFR, Estimated: >60 mL/min (ref >60)
Glucose, Bld: 136 mg/dL — ABNORMAL HIGH (ref 70–99)
Potassium: 4.7 mmol/L (ref 3.5–5.1)
Sodium: 135 mmol/L (ref 135–145)

## 2023-02-20 LAB — BRAIN NATRIURETIC PEPTIDE: B Natriuretic Peptide: 491 pg/mL — ABNORMAL HIGH (ref 0.0–100.0)

## 2023-02-20 LAB — DIGOXIN LEVEL: Digoxin Level: 0.3 ng/mL — ABNORMAL LOW (ref 0.8–2.0)

## 2023-02-20 NOTE — Patient Instructions (Signed)
Medication Changes:  PLEASE TAKE ENTRESTO 24-26MG  TWICE DAILY   Lab Work:  Labs done today, your results will be available in MyChart, we will contact you for abnormal readings.  Referrals:  YOU HAVE BEEN REFERRED TO CARDIAC REHAB AND PULMONARY REHAB THEY WILL REACH OUT TO YOU OR CALL TO ARRANGE THIS. PLEASE CALL us WITH ANY CONCERNS   Follow-Up in: 1 MONTH AS SCHEDULED   At the [facility], you and your health needs are our priority. We have a designated team specialized in the treatment of Heart Failure. This Care Team includes your primary Heart Failure Specialized Cardiologist (physician), Advanced Practice Providers (APPs- Physician Assistants and Nurse Practitioners), and Pharmacist who all work together to provide you with the care you need, when you need it.   You may see any of the following providers on your designated Care Team at your next follow up:  Dr. Arvilla Meres Dr. Marca Ancona Dr. Dorthula Nettles Dr. Theresia Bough Tonye Becket, NP Robbie Lis, [place] [facility] [place], [place] Brynda Peon, NP [place] Lee, NP Karle Plumber, PharmD   Please be sure to bring in all your medications bottles to every appointment.   Need to Contact us:  If you have any questions or concerns before your next appointment please send [place] a message through [place] or call our office at [phone].    TO LEAVE A MESSAGE FOR THE NURSE SELECT OPTION 2, PLEASE LEAVE A MESSAGE INCLUDING: YOUR NAME DATE OF BIRTH CALL BACK NUMBER REASON FOR CALL**this is important as we prioritize the call backs  YOU WILL RECEIVE A CALL BACK THE SAME DAY AS LONG AS YOU CALL BEFORE 4:00 PM

## 2023-02-23 ENCOUNTER — Telehealth (HOSPITAL_COMMUNITY): Payer: Self-pay

## 2023-02-23 ENCOUNTER — Encounter (HOSPITAL_COMMUNITY): Payer: Self-pay

## 2023-02-23 NOTE — Telephone Encounter (Signed)
Attempted to call pt in regards to Cardiac rehab. LM on VM   Mailed letter

## 2023-02-23 NOTE — Telephone Encounter (Signed)
Patient returned my call and he is interested in the Cardiac Rehab Program. Patient expressed interest. Explained scheduling process and went over insurance, patient verbalized understanding. Someone from our cardiac rehab staff will contact pt at a later time.

## 2023-02-23 NOTE — Telephone Encounter (Signed)
Pt insurance is active and benefits verified through [facility] Medicare Co-pay $0, DED $0/$0 met, out of pocket $3600/$570 met, co-insurance 0%. no pre-authorization required. Passport, 02/23/2023@809 , REF# [id number]    How many CR sessions are covered? (36 visits for TCR, 72 visits for ICR)72 Is this a lifetime maximum or an annual maximum? Lifetime Has the member used any of these services to date? no Is there a time limit (weeks/months) on start of program and/or program completion? no

## 2023-02-23 NOTE — Telephone Encounter (Signed)
Office referral recv'ed, printed and given to RN for review.

## 2023-02-25 DIAGNOSIS — E7800 Pure hypercholesterolemia, unspecified: Secondary | ICD-10-CM | POA: Diagnosis not present

## 2023-02-25 DIAGNOSIS — I429 Cardiomyopathy, unspecified: Secondary | ICD-10-CM | POA: Diagnosis not present

## 2023-02-25 DIAGNOSIS — D225 Melanocytic nevi of trunk: Secondary | ICD-10-CM | POA: Diagnosis not present

## 2023-02-25 DIAGNOSIS — D692 Other nonthrombocytopenic purpura: Secondary | ICD-10-CM | POA: Diagnosis not present

## 2023-02-25 DIAGNOSIS — K7581 Nonalcoholic steatohepatitis (NASH): Secondary | ICD-10-CM | POA: Diagnosis not present

## 2023-02-25 DIAGNOSIS — I5023 Acute on chronic systolic (congestive) heart failure: Secondary | ICD-10-CM | POA: Diagnosis not present

## 2023-02-25 DIAGNOSIS — Z23 Encounter for immunization: Secondary | ICD-10-CM | POA: Diagnosis not present

## 2023-02-25 DIAGNOSIS — I130 Hypertensive heart and chronic kidney disease with heart failure and stage 1 through stage 4 chronic kidney disease, or unspecified chronic kidney disease: Secondary | ICD-10-CM | POA: Diagnosis not present

## 2023-02-25 DIAGNOSIS — D2371 Other benign neoplasm of skin of right lower limb, including hip: Secondary | ICD-10-CM | POA: Diagnosis not present

## 2023-02-25 DIAGNOSIS — L821 Other seborrheic keratosis: Secondary | ICD-10-CM | POA: Diagnosis not present

## 2023-02-25 DIAGNOSIS — E1122 Type 2 diabetes mellitus with diabetic chronic kidney disease: Secondary | ICD-10-CM | POA: Diagnosis not present

## 2023-02-25 DIAGNOSIS — I5022 Chronic systolic (congestive) heart failure: Secondary | ICD-10-CM | POA: Diagnosis not present

## 2023-02-25 DIAGNOSIS — I700 Atherosclerosis of aorta: Secondary | ICD-10-CM | POA: Diagnosis not present

## 2023-02-25 DIAGNOSIS — L814 Other melanin hyperpigmentation: Secondary | ICD-10-CM | POA: Diagnosis not present

## 2023-02-25 DIAGNOSIS — Z85820 Personal history of malignant melanoma of skin: Secondary | ICD-10-CM | POA: Diagnosis not present

## 2023-02-25 DIAGNOSIS — E1142 Type 2 diabetes mellitus with diabetic polyneuropathy: Secondary | ICD-10-CM | POA: Diagnosis not present

## 2023-02-25 DIAGNOSIS — J8410 Pulmonary fibrosis, unspecified: Secondary | ICD-10-CM | POA: Diagnosis not present

## 2023-02-25 DIAGNOSIS — D1801 Hemangioma of skin and subcutaneous tissue: Secondary | ICD-10-CM | POA: Diagnosis not present

## 2023-02-27 ENCOUNTER — Telehealth (HOSPITAL_COMMUNITY): Payer: Self-pay | Admitting: *Deleted

## 2023-02-27 DIAGNOSIS — I5023 Acute on chronic systolic (congestive) heart failure: Secondary | ICD-10-CM | POA: Diagnosis not present

## 2023-02-27 DIAGNOSIS — I429 Cardiomyopathy, unspecified: Secondary | ICD-10-CM | POA: Diagnosis not present

## 2023-02-27 NOTE — Telephone Encounter (Signed)
  The following message sent to the referring provider, Dr. Gasper Lloyd concerning this pt participating in CR.  "Thank you for this referral.  Upon review of his medical history, pt is on IV inotrope therapy.  We are not able of care for a patient who has a continuous IV infusion safely and appropriately. Consider other options in the community.  It is my understanding that [facility] cardiac rehab do accept IV infusions as they are in a hospital setting with residents readily available should emergencies arise.  Karlene Lineman RN, BSN Cardiac and Pulmonary Rehab Nurse Navigator"  Called and spoke to [place] to inform him that we are unable to accommodate pt who are on a continuous IV infusion. Pt verbalized understanding.  Advised pt that should he be able to come off of the infusion and he remain medically stable he then could proceed with CR.  Has upcoming appt in November, plan to discuss further for his understanding as he is not clear on the plan  for the length of time regarding inotrope IV therapy. Provided pt with general exercise therapy - walking on level surface 5 minutes 3 x day gradually build up as able with goal of 30 total minutes of exercise a day.  Reviewed stop signs for exercise as well as keeping his cell phone with him when walking alone.  Appreciate the information.  Alanson Aly, BSN Cardiac and Emergency planning/management officer

## 2023-03-03 ENCOUNTER — Other Ambulatory Visit (HOSPITAL_COMMUNITY): Payer: Self-pay | Admitting: Cardiology

## 2023-03-04 DIAGNOSIS — I429 Cardiomyopathy, unspecified: Secondary | ICD-10-CM | POA: Diagnosis not present

## 2023-03-04 DIAGNOSIS — I5023 Acute on chronic systolic (congestive) heart failure: Secondary | ICD-10-CM | POA: Diagnosis not present

## 2023-03-11 DIAGNOSIS — I5023 Acute on chronic systolic (congestive) heart failure: Secondary | ICD-10-CM | POA: Diagnosis not present

## 2023-03-11 DIAGNOSIS — I429 Cardiomyopathy, unspecified: Secondary | ICD-10-CM | POA: Diagnosis not present

## 2023-03-11 DIAGNOSIS — I5022 Chronic systolic (congestive) heart failure: Secondary | ICD-10-CM | POA: Diagnosis not present

## 2023-03-17 ENCOUNTER — Other Ambulatory Visit (HOSPITAL_COMMUNITY): Payer: Self-pay

## 2023-03-18 ENCOUNTER — Other Ambulatory Visit (HOSPITAL_COMMUNITY): Payer: Self-pay

## 2023-03-18 DIAGNOSIS — I5023 Acute on chronic systolic (congestive) heart failure: Secondary | ICD-10-CM | POA: Diagnosis not present

## 2023-03-18 DIAGNOSIS — I429 Cardiomyopathy, unspecified: Secondary | ICD-10-CM | POA: Diagnosis not present

## 2023-03-19 ENCOUNTER — Other Ambulatory Visit (HOSPITAL_COMMUNITY): Payer: Self-pay

## 2023-03-20 ENCOUNTER — Other Ambulatory Visit (HOSPITAL_COMMUNITY): Payer: Self-pay

## 2023-03-23 ENCOUNTER — Ambulatory Visit (HOSPITAL_COMMUNITY)
Admission: RE | Admit: 2023-03-23 | Discharge: 2023-03-23 | Disposition: A | Discharge status: Home / Self Care | Payer: Medicare Other | Source: Ambulatory Visit | Attending: Cardiology | Admitting: Cardiology

## 2023-03-23 ENCOUNTER — Other Ambulatory Visit (HOSPITAL_COMMUNITY): Payer: Self-pay

## 2023-03-23 ENCOUNTER — Encounter (HOSPITAL_COMMUNITY): Payer: Self-pay | Admitting: Cardiology

## 2023-03-23 VITALS — BP 132/80 | HR 115 | Wt 175.0 lb

## 2023-03-23 DIAGNOSIS — Z79899 Other long term (current) drug therapy: Secondary | ICD-10-CM | POA: Diagnosis not present

## 2023-03-23 DIAGNOSIS — I5022 Chronic systolic (congestive) heart failure: Secondary | ICD-10-CM | POA: Diagnosis not present

## 2023-03-23 DIAGNOSIS — I2694 Multiple subsegmental pulmonary emboli without acute cor pulmonale: Secondary | ICD-10-CM | POA: Diagnosis not present

## 2023-03-23 DIAGNOSIS — J849 Interstitial pulmonary disease, unspecified: Secondary | ICD-10-CM

## 2023-03-23 LAB — BASIC METABOLIC PANEL
Anion gap: 10 (ref 5–15)
BUN: 24 mg/dL — ABNORMAL HIGH (ref 8–23)
CO2: 28 mmol/L (ref 22–32)
Calcium: 9.2 mg/dL (ref 8.9–10.3)
Chloride: 100 mmol/L (ref 98–111)
Creatinine, Ser: 1.28 mg/dL — ABNORMAL HIGH (ref 0.61–1.24)
GFR, Estimated: 59 mL/min — ABNORMAL LOW (ref >60)
Glucose, Bld: 147 mg/dL — ABNORMAL HIGH (ref 70–99)
Potassium: 4.7 mmol/L (ref 3.5–5.1)
Sodium: 138 mmol/L (ref 135–145)

## 2023-03-23 LAB — BRAIN NATRIURETIC PEPTIDE: B Natriuretic Peptide: 355.7 pg/mL — ABNORMAL HIGH (ref 0.0–100.0)

## 2023-03-23 MED ORDER — SACUBITRIL-VALSARTAN 24-26 MG PO TABS: ORAL_TABLET | ORAL | Status: DC

## 2023-03-23 NOTE — Patient Instructions (Addendum)
Medication Changes:  INCREASE ENTRESTO TO 24/26MG  IN THE MORNING AND 2 TABLETS (49/51) IN THE EVENING   2 WEEKS OF ELIQUIS SAMPLES GIVEN TODAY- PLEASE CALL us 1 WEEK BEFORE YOU RUN OUT TO SEE IF WE HAVE MORE SAMPLES.   Lab Work:  Labs done today, your results will be available in MyChart, we will contact you for abnormal readings.  Follow-Up in: 1 MONTH AS SCHEDULED   At the [facility], you and your health needs are our priority. We have a designated team specialized in the treatment of Heart Failure. This Care Team includes your primary Heart Failure Specialized Cardiologist (physician), Advanced Practice Providers (APPs- Physician Assistants and Nurse Practitioners), and Pharmacist who all work together to provide you with the care you need, when you need it.   You may see any of the following providers on your designated Care Team at your next follow up:  Dr. Arvilla Meres Dr. Marca Ancona Dr. Dorthula Nettles Dr. Theresia Bough Tonye Becket, NP Robbie Lis, [place] [facility] [place], [place] Brynda Peon, NP [place] Lee, NP Karle Plumber, PharmD   Please be sure to bring in all your medications bottles to every appointment.   Need to Contact us:  If you have any questions or concerns before your next appointment please send [place] a message through [place] or call our office at [phone].    TO LEAVE A MESSAGE FOR THE NURSE SELECT OPTION 2, PLEASE LEAVE A MESSAGE INCLUDING: YOUR NAME DATE OF BIRTH CALL BACK NUMBER REASON FOR CALL**this is important as we prioritize the call backs  YOU WILL RECEIVE A CALL BACK THE SAME DAY AS LONG AS YOU CALL BEFORE 4:00 PM

## 2023-03-23 NOTE — Progress Notes (Signed)
ADVANCED HEART FAILURE CLINIC NOTE  Referring Physician: Emilio Aspen, *  Primary Care: Emilio Aspen, MD Primary Cardiologist: N/A  HPI: Phillip Rich is a 72 y.o. male with T2DM, HTN, renal mass s/p nephrectomy, COPD that presented to the [facility] ER w/ acute onset SOB on 08/27 when he was diagnosed with acute PE and HFrEF w/ an LVEF of 25%-30%; during admission, coronary angiography was significant for CTO of the RCA and diffusely diseased Lcx. RHC w/ elevated pre and post capillary filling pressures. He was diuresed and discharged home and started on some GDMT. Since that time, Phillip Rich has had a CMR demonstrating persistently reduced LV function (EF 20%) and CPX with severe cardiac limitations.   He was directly admitted for low output heart failure.  During that time right heart catheterization consistent with severely reduced cardiac index.  He was started on IV inotropes and underwent LVAD evaluation.  Unfortunately LVAD evaluation was placed on hold after CT chest demonstrated severe lung disease with severely reduced DLCO.  He was ultimately discharged home on IV milrinone.    He has been doing fairly well since his last appointment. Now wishing to start more aggressive therapy now. Wishes to go to go to the gym. No longer having PND; Euvolemic on torsemide 40mg  BID.   Activity level/exercise tolerance:  NYHA III on chronic IV inotropes Orthopnea:  Sleeps on 2 pillows Paroxysmal noctural dyspnea:  No Chest pain/pressure:  No Orthostatic lightheadedness:  No Palpitations:  No Lower extremity edema:  Minimal pretibial edema Presyncope/syncope:  No Cough:  Intermittent  Past Medical History:  Diagnosis Date   Anxiety    BPH (benign prostatic hyperplasia)    Complication of anesthesia    Diabetes mellitus    DJD (degenerative joint disease)    GERD (gastroesophageal reflux disease)    HTN (hypertension)    Hyperlipidemia    ILD (interstitial lung  disease) (HCC)    Left renal mass    Melanoma (HCC)    Mild sleep apnea    Sleep study pending   PONV (postoperative nausea and vomiting)     Current Outpatient Medications  Medication Sig Dispense Refill   albuterol (VENTOLIN HFA) 108 (90 Base) MCG/ACT inhaler Inhale 1-2 puffs into the lungs every 6 (six) hours as needed for wheezing or shortness of breath. 1 each 0   apixaban (ELIQUIS) 5 MG TABS tablet Take 1 tablet (5 mg total) by mouth 2 (two) times daily. 60 tablet 5   aspirin EC 81 MG tablet Take 81 mg by mouth daily. Swallow whole.     atorvastatin (LIPITOR) 80 MG tablet Take 1 tablet (80 mg total) by mouth daily. 90 tablet 3   Cholecalciferol (VITAMIN D PO) Take 1,000 Units by mouth every morning.     digoxin (LANOXIN) 0.125 MG tablet Take 1 tablet (0.125 mg total) by mouth daily. 30 tablet 5   DULoxetine (CYMBALTA) 60 MG capsule Take 60 mg by mouth daily.     empagliflozin (JARDIANCE) 10 MG TABS tablet Take 1 tablet (10 mg total) by mouth daily before breakfast. 90 tablet 3   fluticasone (FLONASE) 50 MCG/ACT nasal spray Place 1 spray into both nostrils 2 (two) times a week.     levocetirizine (XYZAL) 5 MG tablet Take 5 mg by mouth daily.     Melatonin 10 MG TABS Take 10 mg by mouth at bedtime as needed (Sleep).     metFORMIN (GLUCOPHAGE) 1000 MG tablet Take 1,000 mg  by mouth 2 (two) times daily.      milrinone (PRIMACOR) 20 MG/100 ML SOLN infusion Inject 0.0306 mg/min into the vein continuous.     omeprazole (PRILOSEC) 20 MG capsule Take 1 tablet by mouth every morning.      sacubitril-valsartan (ENTRESTO) 24-26 MG Take 1 tablet by mouth 2 (two) times daily. 60 tablet 3   Semaglutide,0.25 or 0.5MG /DOS, (OZEMPIC, 0.25 OR 0.5 MG/DOSE,) 2 MG/3ML SOPN Inject 0.5 mg into the skin once a week.     spironolactone (ALDACTONE) 25 MG tablet Take 1 tablet (25 mg total) by mouth daily. 30 tablet 5   torsemide (DEMADEX) 20 MG tablet Take 2 tablets (40 mg total) by mouth daily. 60 tablet 3    vitamin B-12 (CYANOCOBALAMIN) 100 MCG tablet Take 100 mcg by mouth daily.     zolpidem (AMBIEN) 10 MG tablet Take 10 mg by mouth at bedtime as needed for sleep.     No current facility-administered medications for this encounter.    Allergies  Allergen Reactions   Codeine Nausea And Vomiting   Crestor [Rosuvastatin Calcium] Other (See Comments)    Leg aches   Erythromycin Rash    All Mycin drugs      Social History   Socioeconomic History   Marital status: Single    Spouse name: Not on file   Number of children: 0   Years of education: Not on file   Highest education level: High school graduate  Occupational History   Occupation: retired  Tobacco Use   Smoking status: Former    Current packs/day: 0.00    Average packs/day: 1 pack/day for 50.0 years (50.0 ttl pk-yrs)    Types: Cigarettes    Start date: 10/17/1971    Quit date: 10/16/2021    Years since quitting: 1.4   Smokeless tobacco: Never  Vaping Use   Vaping status: Never Used  Substance and Sexual Activity   Alcohol use: Yes    Comment: rare occ.   Drug use: Yes    Types: Marijuana    Comment: 07/29/21 3 x week to help sleep   Sexual activity: Not on file  Other Topics Concern   Not on file  Social History Narrative   Lives alone.  Divorced.  Retired Engineer, technical sales.       Social Determinants of Health   Financial Resource Strain: Medium Risk (01/16/2022)   Overall Financial Resource Strain (CARDIA)    Difficulty of Paying Living Expenses: Somewhat hard  Food Insecurity: No Food Insecurity (01/28/2023)   Hunger Vital Sign    Worried About Running Out of Food in the Last Year: Never true    Ran Out of Food in the Last Year: Never true  Transportation Needs: No Transportation Needs (01/28/2023)   PRAPARE - Administrator, Civil Service (Medical): No    Lack of Transportation (Non-Medical): No  Physical Activity: Not on file  Stress: Not on file  Social Connections: Not on file  Intimate  Partner Violence: Not At Risk (01/28/2023)   Humiliation, Afraid, Rape, and Kick questionnaire    Fear of Current or Ex-Partner: No    Emotionally Abused: No    Physically Abused: No    Sexually Abused: No      Family History  Problem Relation Age of Onset   Lung cancer Mother    Heart disease Father    Asthma Sister    Asthma Sister    Colon cancer Sister    Rheum arthritis  Sister        x3    PHYSICAL EXAM: Vitals:   03/23/23 1425  BP: 132/80  Pulse: (!) 115  SpO2: 98%   GENERAL: Well nourished, well developed, and in no apparent distress at rest.  HEENT: Negative for arcus senilis or xanthelasma. There is no scleral icterus.  The mucous membranes are pink and moist.   NECK: Supple, No masses. Normal carotid upstrokes without bruits. No masses or thyromegaly.    CHEST: There are no chest wall deformities. There is no chest wall tenderness. Respirations are unlabored.  Lungs- CTA B/L CARDIAC:  JVP: 8 cm          Normal rate with regular rhythm. No murmurs, rubs or gallops.  Pulses are 2+ and symmetrical in upper and lower extremities. No edema.  ABDOMEN: Soft, non-tender, non-distended. There are no masses or hepatomegaly. There are normal bowel sounds.  EXTREMITIES: Warm and well perfused with no cyanosis, clubbing.  LYMPHATIC: No axillary or supraclavicular lymphadenopathy.  NEUROLOGIC: Patient is oriented x3 with no focal or lateralizing neurologic deficits.  PSYCH: Patients affect is appropriate, there is no evidence of anxiety or depression.  SKIN: Warm and dry; no lesions or wounds.    DATA REVIEW  ECG: 02/28/22: NSR  ECHO: TTE 01/10/22: LVEF 15%-20%, RV function moderately reduced. Moderate MR.  TTE 11/25/22: LVEF 30%,   CMR 04/21/22: 1. Moderate LV dilatation with severe systolic dysfunction (EF 20%). There is akinesis of basal inferior wall and thinning/akinesis of mid to apical anterior wall  2.  Normal RV size with mild systolic dysfunction (EF  40%) 3. Subendocardial late gadolinium enhancement consistent with prior infarct in basal inferior, mid to apical anterior, and apical lateral walls. LGE is less than 50% transmural in basal inferior wall, suggesting viability. LGE is greater than 50% transmural in mid to apical anterior and apical lateral walls, suggesting nonviability in this region.  4.  Moderate tricuspid regurgitation (regurgitant fraction 23%)  5. Mild mitral regurgitation (regurgitant fraction 10%)  CATH: 01/15/22   Ost LM lesion is 20% stenosed.   Prox Cx to Dist Cx lesion is 70% stenosed.   Prox LAD to Mid LAD lesion is 30% stenosed.   Mid RCA lesion is 100% stenosed.   Prox RCA lesion is 70% stenosed.   1.  Significant two-vessel coronary artery disease with chronically occluded mid right coronary artery with left to right as well as bridging collaterals.  The left circumflex is tortuous and diffusely diseased in the midsegment. 2.  Left ventricular angiography was not performed.  EF was severely reduced by echo. 3.  Right heart catheterization showed significantly elevated right and left-sided filling pressures, moderate to severe pulmonary hypertension and moderately reduced cardiac output.  08/05/22: HEMODYNAMICS: RA:                  6 mmHg (mean) RV:                  27/4-6 mmHg PA:                  28/15 mmHg (21 mean) PCWP:            14 mmHg (mean)                                      Estimated Fick CO/CI   4.5 L/min, 2.2 L/min/m2 Thermodilution CO/CI  4.5 L/min, 2.2 L/min/m2                                              TPG                 7  mmHg                                              PVR                 1.5 Wood Units  PAPi                2.2    01/28/23:  RA mean 17 RV 57/21 PA 58/30, mean 39 PCWP mean 28  Oxygen saturations: PA 42% AO 95%  Cardiac Output (Fick) 3.37  Cardiac Index (Fick) 1.66   Cardiac Output (Fick) 3.20  Cardiac Index (Fick) 1.58  PVR 3.4 WU   CPX:  06/20/22: Mildly submax test but based on available data there appears to be a severe HF limitations with a blunted BP response to exercise and markedly elevated VeVCO2 slope. Consideration should be given to advanced HF therapies if clinically indicated.   ASSESSMENT & PLAN:  Heart failure with reduced EF, NYHA III Etiology of XB:MWUXL, ischemic and nonischemic components; now on home palliative inotropes. NYHA class / AHA Stage:IIB-III Volume status & Diuretics:  PRN only Vasodilators: Entresto 24/26mg  BID; will try to increase to 49/51mg  BID.  Beta-Blocker: currently on milrinone 0.360mcg/kg/min KGM:[id number] 25mg  daily Cardiometabolic:jardiance 10mg  daily; urinalysis negative for UTI Devices therapies & Valvulopathies: CMR w/ LVEF 20%.  Patient has met Dr. Graciela Husbands for ICD evaluation.  In the interim he has had a very concerning CPX.  Repeat right heart cath with mildly reduced cardiac index.  At this time patient not interested in pursuing advanced therapies or ICD. Repeat TTE on 11/25/22 w/ LVEF of 30%.  Advanced therapies:  Mr. Fergen had a CPX with a peak VO2 of 10.9 and severely elevated VE/VCO2 slope.  Now on milrinone 0.342mcg/kg/min. He is not interested in pursuing LVAD therapy and understands that he is high risk due to underlying lung disease. Recent DLCO less than 50% however not corrected for Hgb. Will follow up with Dr. Marchelle Gearing soon. At this time he wishes to try to wean milrinone off over the next few weeks to months.   2. Pulmonary embolism  - CT from 01/05/22 w/ acute PE involving lobar and segmental branches at the RLL, RML - off DOAC - No dyspnea; doing well.   3. COPD - has not required home O2; stable today; does not report history of frequent exacerbations.  - PFTs with severely reduced DLCO - Will start pulmonary rehab; remains pending; reports that he is unable to start due to PICC line placement. Will look into this.  - Follow up with Dr. Marchelle Gearing next month;  fairly stable from a COPD standpoint.   4. Tobacco abuse - He has now stopped smoking.   5. Chronic palliative inotropes - continue milrinone 0.355mcg/kg/min - will attempt to wean to 0.67mcg/kg/min at follow up visit.  - discussed possibility of CCM therapy; he is too high risk at this point but may be a candidate in the future.  I spent 50 minutes caring for this patient today including face to face time, ordering and reviewing labs, discussing LVAD, CCM, seeing the patient, documenting in the record, and arranging follow ups.    Rayburn Mundis Advanced Heart Failure Mechanical Circulatory Support

## 2023-03-23 NOTE — Progress Notes (Signed)
Medication Samples have been provided to the patient.  Drug name: ELIQUIS        Strength: 5MG         Qty: 2 BOXES  LOT: [id number]  Exp.Date: 06/2024  Dosing instructions: TAKE 1 TABLET TWICE DAILY   The patient has been instructed regarding the correct time, dose, and frequency of taking this medication, including desired effects and most common side effects.   Phillip Rich B Brinklee Cisse 3:27 PM 03/23/2023;

## 2023-03-25 ENCOUNTER — Telehealth (HOSPITAL_COMMUNITY): Payer: Self-pay

## 2023-03-25 DIAGNOSIS — I5023 Acute on chronic systolic (congestive) heart failure: Secondary | ICD-10-CM | POA: Diagnosis not present

## 2023-03-25 DIAGNOSIS — I429 Cardiomyopathy, unspecified: Secondary | ICD-10-CM | POA: Diagnosis not present

## 2023-03-25 DIAGNOSIS — I5022 Chronic systolic (congestive) heart failure: Secondary | ICD-10-CM | POA: Diagnosis not present

## 2023-03-25 NOTE — Telephone Encounter (Signed)
error 

## 2023-04-01 ENCOUNTER — Ambulatory Visit: Payer: Medicare Other | Attending: Cardiology | Admitting: Cardiology

## 2023-04-01 ENCOUNTER — Telehealth (HOSPITAL_COMMUNITY): Payer: Self-pay | Admitting: Cardiology

## 2023-04-01 VITALS — BP 113/77 | HR 124 | Wt 177.2 lb

## 2023-04-01 DIAGNOSIS — J849 Interstitial pulmonary disease, unspecified: Secondary | ICD-10-CM

## 2023-04-01 DIAGNOSIS — I5023 Acute on chronic systolic (congestive) heart failure: Secondary | ICD-10-CM | POA: Diagnosis not present

## 2023-04-01 DIAGNOSIS — I5022 Chronic systolic (congestive) heart failure: Secondary | ICD-10-CM

## 2023-04-01 DIAGNOSIS — Z79899 Other long term (current) drug therapy: Secondary | ICD-10-CM | POA: Diagnosis not present

## 2023-04-01 DIAGNOSIS — J449 Chronic obstructive pulmonary disease, unspecified: Secondary | ICD-10-CM

## 2023-04-01 DIAGNOSIS — I429 Cardiomyopathy, unspecified: Secondary | ICD-10-CM | POA: Diagnosis not present

## 2023-04-01 MED ORDER — SPIRONOLACTONE 25 MG PO TABS: 12.5000 mg | ORAL_TABLET | Freq: Every day | ORAL | 5 refills | Status: DC

## 2023-04-01 NOTE — Progress Notes (Signed)
ADVANCED HEART FAILURE CLINIC NOTE  Referring Physician: Emilio Aspen, *  Primary Care: Emilio Aspen, MD Primary Cardiologist: N/A  HPI: Phillip Rich is a 72 y.o. male with T2DM, HTN, renal mass s/p nephrectomy, COPD that presented to the [facility] w/ acute onset SOB on 08/27 when he was diagnosed with acute PE and HFrEF w/ an LVEF of 25%-30%; during admission, coronary angiography was significant for CTO of the RCA and diffusely diseased Lcx. RHC w/ elevated pre and post capillary filling pressures. He was diuresed and discharged home and started on some GDMT. Since that time, Phillip Rich has had a CMR demonstrating persistently reduced LV function (EF 20%) and CPX with severe cardiac limitations.   He was directly admitted for low output heart failure.  During that time right heart catheterization consistent with severely reduced cardiac index.  He was started on IV inotropes and underwent LVAD evaluation.  Unfortunately LVAD evaluation was placed on hold after CT chest demonstrated severe lung disease with severely reduced DLCO.  He was ultimately discharged home on IV milrinone.    He has been doing fairly well since his last appointment. Now wishing to start more aggressive therapy now. Wishes to go to go to the gym. No longer having PND; Euvolemic on torsemide 40mg  BID.   Activity level/exercise tolerance:  NYHA III on chronic IV inotropes Orthopnea:  Sleeps on 2 pillows Paroxysmal noctural dyspnea:  No Chest pain/pressure:  No Orthostatic lightheadedness:  No Palpitations:  No Lower extremity edema:  Minimal pretibial edema Presyncope/syncope:  No Cough:  Intermittent  Past Medical History:  Diagnosis Date   Anxiety    BPH (benign prostatic hyperplasia)    Complication of anesthesia    Diabetes mellitus    DJD (degenerative joint disease)    GERD (gastroesophageal reflux disease)    HTN (hypertension)    Hyperlipidemia    ILD (interstitial lung  disease) (HCC)    Left renal mass    Melanoma (HCC)    Mild sleep apnea    Sleep study pending   PONV (postoperative nausea and vomiting)     Current Outpatient Medications  Medication Sig Dispense Refill   albuterol (VENTOLIN HFA) 108 (90 Base) MCG/ACT inhaler Inhale 1-2 puffs into the lungs every 6 (six) hours as needed for wheezing or shortness of breath. 1 each 0   apixaban (ELIQUIS) 5 MG TABS tablet Take 1 tablet (5 mg total) by mouth 2 (two) times daily. 60 tablet 5   aspirin EC 81 MG tablet Take 81 mg by mouth daily. Swallow whole.     atorvastatin (LIPITOR) 80 MG tablet Take 1 tablet (80 mg total) by mouth daily. 90 tablet 3   Cholecalciferol (VITAMIN D PO) Take 1,000 Units by mouth every morning.     digoxin (LANOXIN) 0.125 MG tablet Take 1 tablet (0.125 mg total) by mouth daily. 30 tablet 5   DULoxetine (CYMBALTA) 60 MG capsule Take 60 mg by mouth daily.     empagliflozin (JARDIANCE) 10 MG TABS tablet Take 1 tablet (10 mg total) by mouth daily before breakfast. 90 tablet 3   fluticasone (FLONASE) 50 MCG/ACT nasal spray Place 1 spray into both nostrils 2 (two) times a week.     levocetirizine (XYZAL) 5 MG tablet Take 5 mg by mouth daily.     Melatonin 10 MG TABS Take 10 mg by mouth at bedtime as needed (Sleep).     metFORMIN (GLUCOPHAGE) 1000 MG tablet Take 1,000 mg  by mouth 2 (two) times daily.      milrinone (PRIMACOR) 20 MG/100 ML SOLN infusion Inject 0.0306 mg/min into the vein continuous.     omeprazole (PRILOSEC) 20 MG capsule Take 1 tablet by mouth every morning.      sacubitril-valsartan (ENTRESTO) 24-26 MG Take 1 tablet in the morning and take 2 tablets in the evening.     Semaglutide,0.25 or 0.5MG /DOS, (OZEMPIC, 0.25 OR 0.5 MG/DOSE,) 2 MG/3ML SOPN Inject 0.5 mg into the skin once a week.     spironolactone (ALDACTONE) 25 MG tablet Take 1 tablet (25 mg total) by mouth daily. 30 tablet 5   torsemide (DEMADEX) 20 MG tablet Take 2 tablets (40 mg total) by mouth daily. 60  tablet 3   vitamin B-12 (CYANOCOBALAMIN) 100 MCG tablet Take 100 mcg by mouth daily.     zolpidem (AMBIEN) 10 MG tablet Take 10 mg by mouth at bedtime as needed for sleep.     No current facility-administered medications for this visit.    Allergies  Allergen Reactions   Codeine Nausea And Vomiting   Crestor [Rosuvastatin Calcium] Other (See Comments)    Leg aches   Erythromycin Rash    All Mycin drugs      Social History   Socioeconomic History   Marital status: Single    Spouse name: Not on file   Number of children: 0   Years of education: Not on file   Highest education level: High school graduate  Occupational History   Occupation: retired  Tobacco Use   Smoking status: Former    Current packs/day: 0.00    Average packs/day: 1 pack/day for 50.0 years (50.0 ttl pk-yrs)    Types: Cigarettes    Start date: 10/17/1971    Quit date: 10/16/2021    Years since quitting: 1.4   Smokeless tobacco: Never  Vaping Use   Vaping status: Never Used  Substance and Sexual Activity   Alcohol use: Yes    Comment: rare occ.   Drug use: Yes    Types: Marijuana    Comment: 07/29/21 3 x week to help sleep   Sexual activity: Not on file  Other Topics Concern   Not on file  Social History Narrative   Lives alone.  Divorced.  Retired Engineer, technical sales.       Social Determinants of Health   Financial Resource Strain: Medium Risk (01/16/2022)   Overall Financial Resource Strain (CARDIA)    Difficulty of Paying Living Expenses: Somewhat hard  Food Insecurity: No Food Insecurity (01/28/2023)   Hunger Vital Sign    Worried About Running Out of Food in the Last Year: Never true    Ran Out of Food in the Last Year: Never true  Transportation Needs: No Transportation Needs (01/28/2023)   PRAPARE - Administrator, Civil Service (Medical): No    Lack of Transportation (Non-Medical): No  Physical Activity: Not on file  Stress: Not on file  Social Connections: Not on file   Intimate Partner Violence: Not At Risk (01/28/2023)   Humiliation, Afraid, Rape, and Kick questionnaire    Fear of Current or Ex-Partner: No    Emotionally Abused: No    Physically Abused: No    Sexually Abused: No      Family History  Problem Relation Age of Onset   Lung cancer Mother    Heart disease Father    Asthma Sister    Asthma Sister    Colon cancer Sister  Rheum arthritis Sister        x3    PHYSICAL EXAM: Vitals:   04/01/23 1439  BP: 113/77  Pulse: (!) 124  SpO2: 98%   GENERAL: Well nourished, well developed, and in no apparent distress at rest.  HEENT: Negative for arcus senilis or xanthelasma. There is no scleral icterus.  The mucous membranes are pink and moist.   NECK: Supple, No masses. Normal carotid upstrokes without bruits. No masses or thyromegaly.    CHEST: There are no chest wall deformities. There is no chest wall tenderness. Respirations are unlabored.  Lungs- CTA B/L CARDIAC:  JVP: 8 cm          Normal rate with regular rhythm. No murmurs, rubs or gallops.  Pulses are 2+ and symmetrical in upper and lower extremities. No edema.  ABDOMEN: Soft, non-tender, non-distended. There are no masses or hepatomegaly. There are normal bowel sounds.  EXTREMITIES: Warm and well perfused with no cyanosis, clubbing.  LYMPHATIC: No axillary or supraclavicular lymphadenopathy.  NEUROLOGIC: Patient is oriented x3 with no focal or lateralizing neurologic deficits.  PSYCH: Patients affect is appropriate, there is no evidence of anxiety or depression.  SKIN: Warm and dry; no lesions or wounds.    DATA REVIEW  ECG: 02/28/22: NSR  ECHO: TTE 01/10/22: LVEF 15%-20%, RV function moderately reduced. Moderate MR.  TTE 11/25/22: LVEF 30%,   CMR 04/21/22: 1. Moderate LV dilatation with severe systolic dysfunction (EF 20%). There is akinesis of basal inferior wall and thinning/akinesis of mid to apical anterior wall  2.  Normal RV size with mild systolic dysfunction  (EF 40%) 3. Subendocardial late gadolinium enhancement consistent with prior infarct in basal inferior, mid to apical anterior, and apical lateral walls. LGE is less than 50% transmural in basal inferior wall, suggesting viability. LGE is greater than 50% transmural in mid to apical anterior and apical lateral walls, suggesting nonviability in this region.  4.  Moderate tricuspid regurgitation (regurgitant fraction 23%)  5. Mild mitral regurgitation (regurgitant fraction 10%)  CATH: 01/15/22   Ost LM lesion is 20% stenosed.   Prox Cx to Dist Cx lesion is 70% stenosed.   Prox LAD to Mid LAD lesion is 30% stenosed.   Mid RCA lesion is 100% stenosed.   Prox RCA lesion is 70% stenosed.   1.  Significant two-vessel coronary artery disease with chronically occluded mid right coronary artery with left to right as well as bridging collaterals.  The left circumflex is tortuous and diffusely diseased in the midsegment. 2.  Left ventricular angiography was not performed.  EF was severely reduced by echo. 3.  Right heart catheterization showed significantly elevated right and left-sided filling pressures, moderate to severe pulmonary hypertension and moderately reduced cardiac output.  08/05/22: HEMODYNAMICS: RA:                  6 mmHg (mean) RV:                  27/4-6 mmHg PA:                  28/15 mmHg (21 mean) PCWP:            14 mmHg (mean)                                      Estimated Fick CO/CI   4.5 L/min, 2.2 L/min/m2 Thermodilution CO/CI  4.5 L/min, 2.2 L/min/m2                                              TPG                 7  mmHg                                              PVR                 1.5 Wood Units  PAPi                2.2    01/28/23:  RA mean 17 RV 57/21 PA 58/30, mean 39 PCWP mean 28  Oxygen saturations: PA 42% AO 95%  Cardiac Output (Fick) 3.37  Cardiac Index (Fick) 1.66   Cardiac Output (Fick) 3.20  Cardiac Index (Fick) 1.58  PVR 3.4 WU   CPX:  06/20/22: Mildly submax test but based on available data there appears to be a severe HF limitations with a blunted BP response to exercise and markedly elevated VeVCO2 slope. Consideration should be given to advanced HF therapies if clinically indicated.   ASSESSMENT & PLAN:  Heart failure with reduced EF, NYHA III Etiology of VW:[id number], ischemic and nonischemic components; now on home palliative inotropes. NYHA class / AHA Stage:IIB-III Volume status & Diuretics:  PRN only Vasodilators: currently taking Entresto 24/26mg  BID.  Beta-Blocker: currently on milrinone 0.372mcg/kg/min MRA: decrease spironolactone 12.5mg  daily.  Cardiometabolic: hold jardiance for the next 5 days due to hypovolemia.  Devices therapies & Valvulopathies: CMR w/ LVEF 20%.  Patient has met Dr. Graciela Husbands for ICD evaluation.  In the interim he has had a very concerning CPX.  Repeat right heart cath with mildly reduced cardiac index.  At this time patient not interested in pursuing advanced therapies or ICD. Repeat TTE on 11/25/22 w/ LVEF of 30%.  Advanced therapies:  Phillip Rich had a CPX with a peak VO2 of 10.9 and severely elevated VE/VCO2 slope.  Now on milrinone 0.349mcg/kg/min. He is not interested in pursuing LVAD therapy and understands that he is high risk due to underlying lung disease. Recent DLCO less than 50% however not corrected for Hgb. Will follow up with Dr. Marchelle Gearing soon. At this time he wishes to try to wean milrinone off over the next few weeks to months.   2. Pulmonary embolism  - CT from 01/05/22 w/ acute PE involving lobar and segmental branches at the RLL, RML - off DOAC - No dyspnea; doing well.   3. COPD - has not required home O2; stable today; does not report history of frequent exacerbations.  - PFTs with severely reduced DLCO - Will start pulmonary rehab; remains pending; reports that he is unable to start due to PICC line placement. Will look into this.  - Follow up with Dr. Marchelle Gearing next month;  fairly stable from a COPD standpoint.   4. Tobacco abuse - He has now stopped smoking.   5. Chronic palliative inotropes - continue milrinone 0.337mcg/kg/min - will attempt to wean to 0.67mcg/kg/min at follow up visit.  - discussed possibility of CCM therapy; he is too high risk at this point but may be a candidate in the  future.   I spent 50 minutes caring for this patient today including face to face time, ordering and reviewing labs, discussing LVAD, CCM, seeing the patient, documenting in the record, and arranging follow ups.    Gatlin Kittell Advanced Heart Failure Mechanical Circulatory Support

## 2023-04-01 NOTE — Telephone Encounter (Signed)
Pt called with HHRN in the home to report elevated HR 109 (last OV 11/11 HR116). Pt reports he has felt poorly, increase in fatigue, increase in SOB-worsens with exertion, reports feeling woozy. Weight stable at 174 (168 lbs at the time of call)  No recent med changes   Pt cannot attribute above symptoms to anything  new, just feels poorly   Requests an appt with Dr Gasper Lloyd  Add on 11/20 @ 315

## 2023-04-01 NOTE — Patient Instructions (Signed)
DECREASE Spironolactone to 12.5mg  daily  Hold jardiance for 5 days  Routine lab work today. Will notify you of abnormal results  Do the following things EVERYDAY: Weigh yourself in the morning before breakfast. Write it down and keep it in a log. Take your medicines as prescribed Eat low salt foods--Limit salt (sodium) to 2000 mg per day.  Stay as active as you can everyday Limit all fluids for the day to less than 2 liters

## 2023-04-02 DIAGNOSIS — I429 Cardiomyopathy, unspecified: Secondary | ICD-10-CM | POA: Diagnosis not present

## 2023-04-02 DIAGNOSIS — I5023 Acute on chronic systolic (congestive) heart failure: Secondary | ICD-10-CM | POA: Diagnosis not present

## 2023-04-02 LAB — BASIC METABOLIC PANEL
BUN/Creatinine Ratio: 21 (ref 10–24)
BUN: 34 mg/dL — ABNORMAL HIGH (ref 8–27)
CO2: 20 mmol/L (ref 20–29)
Calcium: 9.9 mg/dL (ref 8.6–10.2)
Chloride: 96 mmol/L (ref 96–106)
Creatinine, Ser: 1.59 mg/dL — ABNORMAL HIGH (ref 0.76–1.27)
Glucose: 141 mg/dL — ABNORMAL HIGH (ref 70–99)
Potassium: 4.6 mmol/L (ref 3.5–5.2)
Sodium: 138 mmol/L (ref 134–144)
eGFR: 46 mL/min/{1.73_m2} — ABNORMAL LOW (ref >59)

## 2023-04-02 LAB — BRAIN NATRIURETIC PEPTIDE: BNP: 224.1 pg/mL — ABNORMAL HIGH (ref 0.0–100.0)

## 2023-04-08 ENCOUNTER — Other Ambulatory Visit (HOSPITAL_COMMUNITY): Payer: Self-pay

## 2023-04-08 DIAGNOSIS — I5023 Acute on chronic systolic (congestive) heart failure: Secondary | ICD-10-CM | POA: Diagnosis not present

## 2023-04-08 DIAGNOSIS — I429 Cardiomyopathy, unspecified: Secondary | ICD-10-CM | POA: Diagnosis not present

## 2023-04-13 ENCOUNTER — Ambulatory Visit: Payer: Medicare Other | Admitting: Internal Medicine

## 2023-04-15 ENCOUNTER — Other Ambulatory Visit (HOSPITAL_COMMUNITY): Payer: Self-pay

## 2023-04-15 ENCOUNTER — Telehealth (HOSPITAL_COMMUNITY): Payer: Self-pay

## 2023-04-15 DIAGNOSIS — I5023 Acute on chronic systolic (congestive) heart failure: Secondary | ICD-10-CM | POA: Diagnosis not present

## 2023-04-15 DIAGNOSIS — I429 Cardiomyopathy, unspecified: Secondary | ICD-10-CM | POA: Diagnosis not present

## 2023-04-15 NOTE — Telephone Encounter (Signed)
Advanced Heart Failure Patient Advocate Encounter  The patient was approved for a [employer] grant that will help cover the cost of Digoxin, Entresto, Jardiance, Spironolactone.  Total amount awarded, $10,000.  Effective: 03/23/2023 - 03/21/2024.  BIN [id number] PCN [id number] [id number] ID [id number]  Pharmacy provided with approval and processing information. Patient informed via phone.  Burnell Blanks, CPhT Rx Patient Advocate Phone: [phone]

## 2023-04-16 ENCOUNTER — Telehealth (HOSPITAL_COMMUNITY): Payer: Self-pay

## 2023-04-16 NOTE — Telephone Encounter (Signed)
Medication Samples have been left at registration desk for patient pick up. Drug name: Eliquis 5MG  Qty: 4x 14 ct packages LOT: [id number] Exp.: 05/2024 SIG: Take 1 tablet by mouth twice daily   The patient has been instructed regarding the correct time, dose, and frequency of taking this medication, including desired effects and most common side effects.   Burnell Blanks, CPhT Rx Patient Advocate Phone: [phone]

## 2023-04-21 NOTE — Progress Notes (Incomplete)
ADVANCED HEART FAILURE CLINIC NOTE  Referring Physician: Emilio Aspen, *  Primary Care: Emilio Aspen, MD Primary Cardiologist: N/A  HPI: Phillip Rich is a 72 y.o. male with T2DM, HTN, renal mass s/p nephrectomy, COPD that presented to the [facility] ER w/ acute onset SOB on 08/27 when he was diagnosed with acute PE and HFrEF w/ an LVEF of 25%-30%; during admission, coronary angiography was significant for CTO of the RCA and diffusely diseased Lcx. RHC w/ elevated pre and post capillary filling pressures. He was diuresed and discharged home and started on some GDMT. Since that time, Phillip Rich has had a CMR demonstrating persistently reduced LV function (EF 20%) and CPX with severe cardiac limitations.   He was directly admitted for low output heart failure.  During that time right heart catheterization consistent with severely reduced cardiac index.  He was started on IV inotropes and underwent LVAD evaluation.  Unfortunately LVAD evaluation was placed on hold after CT chest demonstrated severe lung disease with severely reduced DLCO.  He was ultimately discharged home on IV milrinone.    Since his last appointment, he has been doing very well. He has minimal LE edema, no PND, orthopnea or lightheadedness. His only issue is 1/2 ports on his PICC line is now occluded.   Activity level/exercise tolerance:  NYHA IIB-III on chronic IV inotropes Orthopnea:  Sleeps on 2 pillows Paroxysmal noctural dyspnea:  No Chest pain/pressure:  No Orthostatic lightheadedness:  No Palpitations:  No Lower extremity edema:  Minimal pretibial edema Presyncope/syncope:  No Cough:  Intermittent  Past Medical History:  Diagnosis Date   Anxiety    BPH (benign prostatic hyperplasia)    Complication of anesthesia    Diabetes mellitus    DJD (degenerative joint disease)    GERD (gastroesophageal reflux disease)    HTN (hypertension)    Hyperlipidemia    ILD (interstitial lung disease)  (HCC)    Left renal mass    Melanoma (HCC)    Mild sleep apnea    Sleep study pending   PONV (postoperative nausea and vomiting)     Current Outpatient Medications  Medication Sig Dispense Refill   albuterol (VENTOLIN HFA) 108 (90 Base) MCG/ACT inhaler Inhale 1-2 puffs into the lungs every 6 (six) hours as needed for wheezing or shortness of breath. 1 each 0   apixaban (ELIQUIS) 5 MG TABS tablet Take 1 tablet (5 mg total) by mouth 2 (two) times daily. 60 tablet 5   aspirin EC 81 MG tablet Take 81 mg by mouth daily. Swallow whole.     atorvastatin (LIPITOR) 80 MG tablet Take 1 tablet (80 mg total) by mouth daily. 90 tablet 3   Cholecalciferol (VITAMIN D PO) Take 1,000 Units by mouth every morning.     digoxin (LANOXIN) 0.125 MG tablet Take 1 tablet (0.125 mg total) by mouth daily. 30 tablet 5   DULoxetine (CYMBALTA) 60 MG capsule Take 60 mg by mouth daily.     empagliflozin (JARDIANCE) 10 MG TABS tablet Take 1 tablet (10 mg total) by mouth daily before breakfast. 90 tablet 3   fluticasone (FLONASE) 50 MCG/ACT nasal spray Place 1 spray into both nostrils 2 (two) times a week.     levocetirizine (XYZAL) 5 MG tablet Take 5 mg by mouth daily.     Melatonin 10 MG TABS Take 10 mg by mouth at bedtime as needed (Sleep).     metFORMIN (GLUCOPHAGE) 1000 MG tablet Take 1,000 mg by mouth  2 (two) times daily.      milrinone (PRIMACOR) 20 MG/100 ML SOLN infusion Inject 0.0306 mg/min into the vein continuous.     omeprazole (PRILOSEC) 20 MG capsule Take 1 tablet by mouth every morning.      sacubitril-valsartan (ENTRESTO) 24-26 MG Take 1 tablet by mouth 2 (two) times daily. 1 tablet in the am and 1/2 tablet in the evening     Semaglutide,0.25 or 0.5MG /DOS, (OZEMPIC, 0.25 OR 0.5 MG/DOSE,) 2 MG/3ML SOPN Inject 0.5 mg into the skin once a week.     spironolactone (ALDACTONE) 25 MG tablet Take 0.5 tablets (12.5 mg total) by mouth daily. 45 tablet 5   torsemide (DEMADEX) 20 MG tablet Take 2 tablets (40 mg  total) by mouth daily. 60 tablet 3   vitamin B-12 (CYANOCOBALAMIN) 100 MCG tablet Take 100 mcg by mouth daily.     zolpidem (AMBIEN) 10 MG tablet Take 10 mg by mouth at bedtime as needed for sleep.     No current facility-administered medications for this encounter.    Allergies  Allergen Reactions   Codeine Nausea And Vomiting   Crestor [Rosuvastatin Calcium] Other (See Comments)    Leg aches   Erythromycin Rash    All Mycin drugs      Social History   Socioeconomic History   Marital status: Single    Spouse name: Not on file   Number of children: 0   Years of education: Not on file   Highest education level: High school graduate  Occupational History   Occupation: retired  Tobacco Use   Smoking status: Former    Current packs/day: 0.00    Average packs/day: 1 pack/day for 50.0 years (50.0 ttl pk-yrs)    Types: Cigarettes    Start date: 10/17/1971    Quit date: 10/16/2021    Years since quitting: 1.5   Smokeless tobacco: Never  Vaping Use   Vaping status: Never Used  Substance and Sexual Activity   Alcohol use: Yes    Comment: rare occ.   Drug use: Yes    Types: Marijuana    Comment: 07/29/21 3 x week to help sleep   Sexual activity: Not on file  Other Topics Concern   Not on file  Social History Narrative   Lives alone.  Divorced.  Retired Engineer, technical sales.       Social Determinants of Health   Financial Resource Strain: Medium Risk (01/16/2022)   Overall Financial Resource Strain (CARDIA)    Difficulty of Paying Living Expenses: Somewhat hard  Food Insecurity: No Food Insecurity (01/28/2023)   Hunger Vital Sign    Worried About Running Out of Food in the Last Year: Never true    Ran Out of Food in the Last Year: Never true  Transportation Needs: No Transportation Needs (01/28/2023)   PRAPARE - Administrator, Civil Service (Medical): No    Lack of Transportation (Non-Medical): No  Physical Activity: Not on file  Stress: Not on file  Social  Connections: Not on file  Intimate Partner Violence: Not At Risk (01/28/2023)   Humiliation, Afraid, Rape, and Kick questionnaire    Fear of Current or Ex-Partner: No    Emotionally Abused: No    Physically Abused: No    Sexually Abused: No      Family History  Problem Relation Age of Onset   Lung cancer Mother    Heart disease Father    Asthma Sister    Asthma Sister  Colon cancer Sister    Rheum arthritis Sister        x3    PHYSICAL EXAM: Vitals:   04/22/23 1340  BP: 104/60  Pulse: (!) 105  SpO2: 97%   GENERAL: Well nourished, well developed, and in no apparent distress at rest.  HEENT: Negative for arcus senilis or xanthelasma. There is no scleral icterus.  The mucous membranes are pink and moist.   NECK: Supple, No masses. Normal carotid upstrokes without bruits. No masses or thyromegaly.    CHEST: There are no chest wall deformities. There is no chest wall tenderness. Respirations are unlabored.  Lungs- CTA B/L CARDIAC:  JVP: 7 cm          Normal rate with regular rhythm. No murmurs, rubs or gallops.  Pulses are 2+ and symmetrical in upper and lower extremities. Trace pretibial edema.  ABDOMEN: Soft, non-tender, non-distended. There are no masses or hepatomegaly. There are normal bowel sounds.  EXTREMITIES: Warm and well perfused with no cyanosis, clubbing.  LYMPHATIC: No axillary or supraclavicular lymphadenopathy.  NEUROLOGIC: Patient is oriented x3 with no focal or lateralizing neurologic deficits.  PSYCH: Patients affect is appropriate, there is no evidence of anxiety or depression.  SKIN: Warm and dry; no lesions or wounds.    DATA REVIEW  ECG: 02/28/22: NSR  ECHO: TTE 01/10/22: LVEF 15%-20%, RV function moderately reduced. Moderate MR.  TTE 11/25/22: LVEF 30%,   CMR 04/21/22: 1. Moderate LV dilatation with severe systolic dysfunction (EF 20%). There is akinesis of basal inferior wall and thinning/akinesis of mid to apical anterior wall  2.  Normal  RV size with mild systolic dysfunction (EF 40%) 3. Subendocardial late gadolinium enhancement consistent with prior infarct in basal inferior, mid to apical anterior, and apical lateral walls. LGE is less than 50% transmural in basal inferior wall, suggesting viability. LGE is greater than 50% transmural in mid to apical anterior and apical lateral walls, suggesting nonviability in this region.  4.  Moderate tricuspid regurgitation (regurgitant fraction 23%)  5. Mild mitral regurgitation (regurgitant fraction 10%)  CATH: 01/15/22   Ost LM lesion is 20% stenosed.   Prox Cx to Dist Cx lesion is 70% stenosed.   Prox LAD to Mid LAD lesion is 30% stenosed.   Mid RCA lesion is 100% stenosed.   Prox RCA lesion is 70% stenosed.   1.  Significant two-vessel coronary artery disease with chronically occluded mid right coronary artery with left to right as well as bridging collaterals.  The left circumflex is tortuous and diffusely diseased in the midsegment. 2.  Left ventricular angiography was not performed.  EF was severely reduced by echo. 3.  Right heart catheterization showed significantly elevated right and left-sided filling pressures, moderate to severe pulmonary hypertension and moderately reduced cardiac output.  08/05/22: HEMODYNAMICS: RA:                  6 mmHg (mean) RV:                  27/4-6 mmHg PA:                  28/15 mmHg (21 mean) PCWP:            14 mmHg (mean)                                      Estimated Fick CO/CI  4.5 L/min, 2.2 L/min/m2 Thermodilution CO/CI   4.5 L/min, 2.2 L/min/m2                                              TPG                 7  mmHg                                              PVR                 1.5 Wood Units  PAPi                2.2    01/28/23:  RA mean 17 RV 57/21 PA 58/30, mean 39 PCWP mean 28  Oxygen saturations: PA 42% AO 95%  Cardiac Output (Fick) 3.37  Cardiac Index (Fick) 1.66   Cardiac Output (Fick) 3.20  Cardiac  Index (Fick) 1.58  PVR 3.4 WU   CPX: 06/20/22: Mildly submax test but based on available data there appears to be a severe HF limitations with a blunted BP response to exercise and markedly elevated VeVCO2 slope. Consideration should be given to advanced HF therapies if clinically indicated.   ASSESSMENT & PLAN:  Heart failure with reduced EF, NYHA III Etiology of XL:KGMWN, ischemic and nonischemic components; now on home palliative inotropes. NYHA class / AHA Stage:IIB-III Volume status & Diuretics:  PRN only Vasodilators: currently taking Entresto 24/26mg  BID.  Beta-Blocker: currently on milrinone 0.392mcg/kg/min MRA: spironolactone 12.5mg  daily.  Cardiometabolic: restarted jardiance 10mg  daily Devices therapies & Valvulopathies: CMR w/ LVEF 20%.  Patient has met Dr. Graciela Husbands for ICD evaluation.  In the interim he has had a very concerning CPX.  Repeat right heart cath with mildly reduced cardiac index.  At this time patient not interested in pursuing advanced therapies or ICD. Repeat TTE on 11/25/22 w/ LVEF of 30%.  Advanced therapies:  Mr. Degner had a CPX with a peak VO2 of 10.9 and severely elevated VE/VCO2 slope.  Now on milrinone 0.34mcg/kg/min. He is not interested in pursuing LVAD therapy and understands that he is high risk due to underlying lung disease. Recent DLCO less than 50% however not corrected for Hgb. Will follow up with Dr. Marchelle Gearing soon. He is going on a trip to [place]; we will start weaning milrinone at his next follow up.   2. Pulmonary embolism  - CT from 01/05/22 w/ acute PE involving lobar and segmental branches at the RLL, RML - off DOAC - No dyspnea; off AC.   3. COPD - has not required home O2; stable today; does not report history of frequent exacerbations.  - PFTs with severely reduced DLCO - Will start pulmonary rehab; remains pending; reports that he is unable to start due to PICC line placement. Will look into this.  - Follow up with Dr. Marchelle Gearing next  month.  - stable; no wheezing on exam  4. Tobacco abuse - He has now stopped smoking.   5. Chronic palliative inotropes - continue milrinone 0.350mcg/kg/min - will attempt to wean to 0.3mcg/kg/min at follow up visit.  - discussed possibility of CCM therapy; he is too high risk at this point but may be a candidate in the  future.  - BMP/BNP today.  6. PICC line occlusion - Discussed with IV team and nursing staff today.  - Now s/p TPA with resolution of low flow / occlusion.    I spent 45 minutes caring for this patient today including face to face time, ordering and reviewing labs, discussing risks/benefits of milrinone wean, contacting IV team and assessment of PICC line occlusion, seeing the patient, documenting in the record, and arranging follow ups.    Phillip Rich Advanced Heart Failure Mechanical Circulatory Support

## 2023-04-22 ENCOUNTER — Encounter (HOSPITAL_COMMUNITY): Payer: Self-pay | Admitting: Cardiology

## 2023-04-22 ENCOUNTER — Ambulatory Visit (HOSPITAL_COMMUNITY)
Admission: RE | Admit: 2023-04-22 | Discharge: 2023-04-22 | Disposition: A | Discharge status: Home / Self Care | Payer: Medicare Other | Source: Ambulatory Visit | Attending: Cardiology | Admitting: Cardiology

## 2023-04-22 VITALS — BP 104/60 | HR 105 | Wt 178.3 lb

## 2023-04-22 DIAGNOSIS — Z79899 Other long term (current) drug therapy: Secondary | ICD-10-CM | POA: Insufficient documentation

## 2023-04-22 DIAGNOSIS — I2694 Multiple subsegmental pulmonary emboli without acute cor pulmonale: Secondary | ICD-10-CM

## 2023-04-22 DIAGNOSIS — Z87891 Personal history of nicotine dependence: Secondary | ICD-10-CM | POA: Diagnosis not present

## 2023-04-22 DIAGNOSIS — Z86711 Personal history of pulmonary embolism: Secondary | ICD-10-CM | POA: Insufficient documentation

## 2023-04-22 DIAGNOSIS — Z7984 Long term (current) use of oral hypoglycemic drugs: Secondary | ICD-10-CM | POA: Diagnosis not present

## 2023-04-22 DIAGNOSIS — Z7901 Long term (current) use of anticoagulants: Secondary | ICD-10-CM | POA: Insufficient documentation

## 2023-04-22 DIAGNOSIS — J449 Chronic obstructive pulmonary disease, unspecified: Secondary | ICD-10-CM | POA: Insufficient documentation

## 2023-04-22 DIAGNOSIS — E119 Type 2 diabetes mellitus without complications: Secondary | ICD-10-CM | POA: Insufficient documentation

## 2023-04-22 DIAGNOSIS — I429 Cardiomyopathy, unspecified: Secondary | ICD-10-CM | POA: Diagnosis not present

## 2023-04-22 DIAGNOSIS — I110 Hypertensive heart disease with heart failure: Secondary | ICD-10-CM | POA: Insufficient documentation

## 2023-04-22 DIAGNOSIS — J849 Interstitial pulmonary disease, unspecified: Secondary | ICD-10-CM

## 2023-04-22 DIAGNOSIS — I5022 Chronic systolic (congestive) heart failure: Secondary | ICD-10-CM | POA: Insufficient documentation

## 2023-04-22 DIAGNOSIS — Z905 Acquired absence of kidney: Secondary | ICD-10-CM | POA: Insufficient documentation

## 2023-04-22 DIAGNOSIS — I428 Other cardiomyopathies: Secondary | ICD-10-CM | POA: Diagnosis not present

## 2023-04-22 DIAGNOSIS — Z452 Encounter for adjustment and management of vascular access device: Secondary | ICD-10-CM | POA: Diagnosis not present

## 2023-04-22 DIAGNOSIS — I255 Ischemic cardiomyopathy: Secondary | ICD-10-CM | POA: Insufficient documentation

## 2023-04-22 DIAGNOSIS — I10 Essential (primary) hypertension: Secondary | ICD-10-CM | POA: Diagnosis present

## 2023-04-22 DIAGNOSIS — I5023 Acute on chronic systolic (congestive) heart failure: Secondary | ICD-10-CM | POA: Diagnosis not present

## 2023-04-22 LAB — BASIC METABOLIC PANEL
Anion gap: 8 (ref 5–15)
BUN: 19 mg/dL (ref 8–23)
CO2: 25 mmol/L (ref 22–32)
Calcium: 8.9 mg/dL (ref 8.9–10.3)
Chloride: 103 mmol/L (ref 98–111)
Creatinine, Ser: 1.2 mg/dL (ref 0.61–1.24)
GFR, Estimated: >60 mL/min (ref >60)
Glucose, Bld: 166 mg/dL — ABNORMAL HIGH (ref 70–99)
Potassium: 3.9 mmol/L (ref 3.5–5.1)
Sodium: 136 mmol/L (ref 135–145)

## 2023-04-22 LAB — BRAIN NATRIURETIC PEPTIDE: B Natriuretic Peptide: 369.7 pg/mL — ABNORMAL HIGH (ref 0.0–100.0)

## 2023-04-22 NOTE — Patient Instructions (Signed)
There has been no changes to your medications.  Labs done today, your results will be available in MyChart, we will contact you for abnormal readings.  Your physician recommends that you schedule a follow-up appointment in: 1 month  If you have any questions or concerns before your next appointment please send [place] a message through [place] or call our office at [phone].    TO LEAVE A MESSAGE FOR THE NURSE SELECT OPTION 2, PLEASE LEAVE A MESSAGE INCLUDING: YOUR NAME DATE OF BIRTH CALL BACK NUMBER REASON FOR CALL**this is important as we prioritize the call backs  YOU WILL RECEIVE A CALL BACK THE SAME DAY AS LONG AS YOU CALL BEFORE 4:00 PM  At the [facility], you and your health needs are our priority. As part of our continuing mission to provide you with exceptional heart care, we have created designated Provider Care Teams. These Care Teams include your primary Cardiologist (physician) and Advanced Practice Providers (APPs- Physician Assistants and Nurse Practitioners) who all work together to provide you with the care you need, when you need it.   You may see any of the following providers on your designated Care Team at your next follow up: Dr Arvilla Meres Dr Marca Ancona Dr. Dorthula Nettles Dr. Clearnce Hasten Amy Filbert Schilder, NP Robbie Lis, [place] [facility] [place], [place] Brynda Peon, NP [place] Lee, NP Karle Plumber, PharmD   Please be sure to bring in all your medications bottles to every appointment.    Thank you for choosing [facility]

## 2023-04-29 DIAGNOSIS — I429 Cardiomyopathy, unspecified: Secondary | ICD-10-CM | POA: Diagnosis not present

## 2023-04-29 DIAGNOSIS — I5023 Acute on chronic systolic (congestive) heart failure: Secondary | ICD-10-CM | POA: Diagnosis not present

## 2023-05-01 ENCOUNTER — Other Ambulatory Visit (HOSPITAL_COMMUNITY): Payer: Self-pay

## 2023-05-01 ENCOUNTER — Other Ambulatory Visit (HOSPITAL_COMMUNITY): Payer: Self-pay | Admitting: Cardiology

## 2023-05-01 MED ORDER — DIGOXIN 125 MCG PO TABS: 0.1250 mg | ORAL_TABLET | Freq: Every day | ORAL | 6 refills | Status: DC

## 2023-05-04 DIAGNOSIS — I5022 Chronic systolic (congestive) heart failure: Secondary | ICD-10-CM | POA: Diagnosis not present

## 2023-05-04 DIAGNOSIS — I429 Cardiomyopathy, unspecified: Secondary | ICD-10-CM | POA: Diagnosis not present

## 2023-05-04 DIAGNOSIS — I5023 Acute on chronic systolic (congestive) heart failure: Secondary | ICD-10-CM | POA: Diagnosis not present

## 2023-05-06 DIAGNOSIS — I429 Cardiomyopathy, unspecified: Secondary | ICD-10-CM | POA: Diagnosis not present

## 2023-05-06 DIAGNOSIS — I5023 Acute on chronic systolic (congestive) heart failure: Secondary | ICD-10-CM | POA: Diagnosis not present

## 2023-05-11 DIAGNOSIS — I429 Cardiomyopathy, unspecified: Secondary | ICD-10-CM | POA: Diagnosis not present

## 2023-05-11 DIAGNOSIS — I5023 Acute on chronic systolic (congestive) heart failure: Secondary | ICD-10-CM | POA: Diagnosis not present

## 2023-05-13 DIAGNOSIS — I5023 Acute on chronic systolic (congestive) heart failure: Secondary | ICD-10-CM | POA: Diagnosis not present

## 2023-05-13 DIAGNOSIS — I429 Cardiomyopathy, unspecified: Secondary | ICD-10-CM | POA: Diagnosis not present

## 2023-05-18 DIAGNOSIS — Z0189 Encounter for other specified special examinations: Secondary | ICD-10-CM | POA: Diagnosis not present

## 2023-05-18 DIAGNOSIS — I5023 Acute on chronic systolic (congestive) heart failure: Secondary | ICD-10-CM | POA: Diagnosis not present

## 2023-05-18 DIAGNOSIS — I429 Cardiomyopathy, unspecified: Secondary | ICD-10-CM | POA: Diagnosis not present

## 2023-05-21 ENCOUNTER — Other Ambulatory Visit (HOSPITAL_COMMUNITY): Payer: Self-pay

## 2023-05-21 ENCOUNTER — Other Ambulatory Visit: Payer: Self-pay | Admitting: Cardiology

## 2023-05-22 ENCOUNTER — Other Ambulatory Visit (HOSPITAL_COMMUNITY): Payer: Self-pay

## 2023-05-22 ENCOUNTER — Other Ambulatory Visit: Payer: Self-pay

## 2023-05-22 MED ORDER — DIGOXIN 125 MCG PO TABS: 125.0000 ug | ORAL_TABLET | Freq: Every day | ORAL | 0 refills | Status: DC

## 2023-05-25 DIAGNOSIS — I429 Cardiomyopathy, unspecified: Secondary | ICD-10-CM | POA: Diagnosis not present

## 2023-05-25 DIAGNOSIS — I5023 Acute on chronic systolic (congestive) heart failure: Secondary | ICD-10-CM | POA: Diagnosis not present

## 2023-06-01 ENCOUNTER — Other Ambulatory Visit (HOSPITAL_COMMUNITY): Payer: Self-pay

## 2023-06-01 DIAGNOSIS — I5023 Acute on chronic systolic (congestive) heart failure: Secondary | ICD-10-CM | POA: Diagnosis not present

## 2023-06-01 DIAGNOSIS — I509 Heart failure, unspecified: Secondary | ICD-10-CM | POA: Diagnosis not present

## 2023-06-01 DIAGNOSIS — I429 Cardiomyopathy, unspecified: Secondary | ICD-10-CM | POA: Diagnosis not present

## 2023-06-01 NOTE — Progress Notes (Signed)
ADVANCED HEART FAILURE CLINIC NOTE  Referring Physician: Emilio Aspen, *  Primary Care: Emilio Aspen, MD Primary Cardiologist: N/A  HPI: Phillip Rich is a 73 y.o. male with T2DM, HTN, renal mass s/p nephrectomy, COPD that presented to the [facility] w/ acute onset SOB on 08/27 when he was diagnosed with acute PE and HFrEF w/ an LVEF of 25%-30%; during admission, coronary angiography was significant for CTO of the RCA and diffusely diseased Lcx. RHC w/ elevated pre and post capillary filling pressures. He was diuresed and discharged home and started on some GDMT. Since that time, Phillip Rich has had a CMR demonstrating persistently reduced LV function (EF 20%) and CPX with severe cardiac limitations.   He was directly admitted for low output heart failure.  During that time right heart catheterization consistent with severely reduced cardiac index.  He was started on IV inotropes and underwent LVAD evaluation.  Unfortunately LVAD evaluation was placed on hold after CT chest demonstrated severe lung disease with severely reduced DLCO.  He was ultimately discharged home on IV milrinone.    Since his last appointment, he has been doing very well. He has minimal LE edema, no PND, orthopnea or lightheadedness. His only issue is 1/2 ports on his PICC line is now occluded.   Activity level/exercise tolerance:  NYHA IIB-III on chronic IV inotropes Orthopnea:  Sleeps on 2 pillows Paroxysmal noctural dyspnea:  No Chest pain/pressure:  No Orthostatic lightheadedness:  No Palpitations:  No Lower extremity edema:  Minimal pretibial edema Presyncope/syncope:  No Cough:  Intermittent  Past Medical History:  Diagnosis Date   Anxiety    BPH (benign prostatic hyperplasia)    Complication of anesthesia    Diabetes mellitus    DJD (degenerative joint disease)    GERD (gastroesophageal reflux disease)    HTN (hypertension)    Hyperlipidemia    ILD (interstitial lung disease)  (HCC)    Left renal mass    Melanoma (HCC)    Mild sleep apnea    Sleep study pending   PONV (postoperative nausea and vomiting)     Current Outpatient Medications  Medication Sig Dispense Refill   albuterol (VENTOLIN HFA) 108 (90 Base) MCG/ACT inhaler Inhale 1-2 puffs into the lungs every 6 (six) hours as needed for wheezing or shortness of breath. 1 each 0   apixaban (ELIQUIS) 5 MG TABS tablet Take 1 tablet (5 mg total) by mouth 2 (two) times daily. 60 tablet 5   aspirin EC 81 MG tablet Take 81 mg by mouth daily. Swallow whole.     atorvastatin (LIPITOR) 80 MG tablet Take 1 tablet (80 mg total) by mouth daily. 90 tablet 3   Cholecalciferol (VITAMIN D PO) Take 1,000 Units by mouth every morning.     digoxin (LANOXIN) 0.125 MG tablet Take 1 tablet (0.125 mg total) by mouth daily. 30 tablet 6   digoxin (LANOXIN) 0.125 MG tablet Take 1 tablet (125 mcg total) by mouth daily. 90 tablet 0   DULoxetine (CYMBALTA) 60 MG capsule Take 60 mg by mouth daily.     empagliflozin (JARDIANCE) 10 MG TABS tablet Take 1 tablet (10 mg total) by mouth daily before breakfast. 90 tablet 3   fluticasone (FLONASE) 50 MCG/ACT nasal spray Place 1 spray into both nostrils 2 (two) times a week.     levocetirizine (XYZAL) 5 MG tablet Take 5 mg by mouth daily.     Melatonin 10 MG TABS Take 10 mg by mouth  at bedtime as needed (Sleep).     metFORMIN (GLUCOPHAGE) 1000 MG tablet Take 1,000 mg by mouth 2 (two) times daily.      milrinone (PRIMACOR) 20 MG/100 ML SOLN infusion Inject 0.0306 mg/min into the vein continuous.     omeprazole (PRILOSEC) 20 MG capsule Take 1 tablet by mouth every morning.      sacubitril-valsartan (ENTRESTO) 24-26 MG Take 1 tablet by mouth 2 (two) times daily. 1 tablet in the am and 1/2 tablet in the evening     Semaglutide,0.25 or 0.5MG /DOS, (OZEMPIC, 0.25 OR 0.5 MG/DOSE,) 2 MG/3ML SOPN Inject 0.5 mg into the skin once a week.     spironolactone (ALDACTONE) 25 MG tablet Take 0.5 tablets (12.5 mg  total) by mouth daily. 45 tablet 5   torsemide (DEMADEX) 20 MG tablet Take 2 tablets (40 mg total) by mouth daily. 60 tablet 3   vitamin B-12 (CYANOCOBALAMIN) 100 MCG tablet Take 100 mcg by mouth daily.     zolpidem (AMBIEN) 10 MG tablet Take 10 mg by mouth at bedtime as needed for sleep.     No current facility-administered medications for this visit.    Allergies  Allergen Reactions   Codeine Nausea And Vomiting   Crestor [Rosuvastatin Calcium] Other (See Comments)    Leg aches   Erythromycin Rash    All Mycin drugs      Social History   Socioeconomic History   Marital status: Single    Spouse name: Not on file   Number of children: 0   Years of education: Not on file   Highest education level: High school graduate  Occupational History   Occupation: retired  Tobacco Use   Smoking status: Former    Current packs/day: 0.00    Average packs/day: 1 pack/day for 50.0 years (50.0 ttl pk-yrs)    Types: Cigarettes    Start date: 10/17/1971    Quit date: 10/16/2021    Years since quitting: 1.6   Smokeless tobacco: Never  Vaping Use   Vaping status: Never Used  Substance and Sexual Activity   Alcohol use: Yes    Comment: rare occ.   Drug use: Yes    Types: Marijuana    Comment: 07/29/21 3 x week to help sleep   Sexual activity: Not on file  Other Topics Concern   Not on file  Social History Narrative   Lives alone.  Divorced.  Retired Engineer, technical sales.       Social Drivers of Health   Financial Resource Strain: Medium Risk (01/16/2022)   Overall Financial Resource Strain (CARDIA)    Difficulty of Paying Living Expenses: Somewhat hard  Food Insecurity: No Food Insecurity (01/28/2023)   Hunger Vital Sign    Worried About Running Out of Food in the Last Year: Never true    Ran Out of Food in the Last Year: Never true  Transportation Needs: No Transportation Needs (01/28/2023)   PRAPARE - Administrator, Civil Service (Medical): No    Lack of Transportation  (Non-Medical): No  Physical Activity: Not on file  Stress: Not on file  Social Connections: Not on file  Intimate Partner Violence: Not At Risk (01/28/2023)   Humiliation, Afraid, Rape, and Kick questionnaire    Fear of Current or Ex-Partner: No    Emotionally Abused: No    Physically Abused: No    Sexually Abused: No      Family History  Problem Relation Age of Onset   Lung cancer Mother  Heart disease Father    Asthma Sister    Asthma Sister    Colon cancer Sister    Rheum arthritis Sister        x3    PHYSICAL EXAM: There were no vitals filed for this visit. GENERAL: Well nourished, well developed, and in no apparent distress at rest.  HEENT: There is no scleral icterus.  The mucous membranes are pink and moist.   CHEST: There are no chest wall deformities. There is no chest wall tenderness. Respirations are unlabored.  Lungs- *** CARDIAC:  JVP: *** cm          Normal rate with regular rhythm. *** murmur.  Pulses are 2+ and symmetrical in upper and lower extremities. *** edema.  ABDOMEN: Soft, non-tender, non-distended. There are normal bowel sounds.  EXTREMITIES: Warm and well perfused.  NEUROLOGIC: Patient is oriented x3 with no obvious focal neurologic deficits.  PSYCH: Patients affect is appropriate SKIN: Warm and dry; no lesions or wounds.     DATA REVIEW  ECG: 02/28/22: NSR  ECHO: TTE 01/10/22: LVEF 15%-20%, RV function moderately reduced. Moderate MR.  TTE 11/25/22: LVEF 30%,   CMR 04/21/22: 1. Moderate LV dilatation with severe systolic dysfunction (EF 20%). There is akinesis of basal inferior wall and thinning/akinesis of mid to apical anterior wall  2.  Normal RV size with mild systolic dysfunction (EF 40%) 3. Subendocardial late gadolinium enhancement consistent with prior infarct in basal inferior, mid to apical anterior, and apical lateral walls. LGE is less than 50% transmural in basal inferior wall, suggesting viability. LGE is greater than  50% transmural in mid to apical anterior and apical lateral walls, suggesting nonviability in this region.  4.  Moderate tricuspid regurgitation (regurgitant fraction 23%)  5. Mild mitral regurgitation (regurgitant fraction 10%)  CATH: 01/15/22   Ost LM lesion is 20% stenosed.   Prox Cx to Dist Cx lesion is 70% stenosed.   Prox LAD to Mid LAD lesion is 30% stenosed.   Mid RCA lesion is 100% stenosed.   Prox RCA lesion is 70% stenosed.   1.  Significant two-vessel coronary artery disease with chronically occluded mid right coronary artery with left to right as well as bridging collaterals.  The left circumflex is tortuous and diffusely diseased in the midsegment. 2.  Left ventricular angiography was not performed.  EF was severely reduced by echo. 3.  Right heart catheterization showed significantly elevated right and left-sided filling pressures, moderate to severe pulmonary hypertension and moderately reduced cardiac output.  08/05/22: HEMODYNAMICS: RA:                  6 mmHg (mean) RV:                  27/4-6 mmHg PA:                  28/15 mmHg (21 mean) PCWP:            14 mmHg (mean)                                      Estimated Fick CO/CI   4.5 L/min, 2.2 L/min/m2 Thermodilution CO/CI   4.5 L/min, 2.2 L/min/m2  TPG                 7  mmHg                                              PVR                 1.5 Wood Units  PAPi                2.2    01/28/23:  RA mean 17 RV 57/21 PA 58/30, mean 39 PCWP mean 28  Oxygen saturations: PA 42% AO 95%  Cardiac Output (Fick) 3.37  Cardiac Index (Fick) 1.66   Cardiac Output (Fick) 3.20  Cardiac Index (Fick) 1.58  PVR 3.4 WU   CPX: 06/20/22: Mildly submax test but based on available data there appears to be a severe HF limitations with a blunted BP response to exercise and markedly elevated VeVCO2 slope. Consideration should be given to advanced HF therapies if clinically indicated.    ASSESSMENT & PLAN:  Heart failure with reduced EF, NYHA III Etiology of WJ:XBJYN, ischemic and nonischemic components; now on home palliative inotropes. NYHA class / AHA Stage:IIB-III Volume status & Diuretics:  PRN only Vasodilators: currently taking Entresto 24/26mg  BID.  Beta-Blocker: currently on milrinone 0.365mcg/kg/min MRA: spironolactone 12.5mg  daily.  Cardiometabolic: restarted jardiance 10mg  daily Devices therapies & Valvulopathies: CMR w/ LVEF 20%.  Patient has met Dr. Graciela Husbands for ICD evaluation.  In the interim he has had a very concerning CPX.  Repeat right heart cath with mildly reduced cardiac index.  At this time patient not interested in pursuing advanced therapies or ICD. Repeat TTE on 11/25/22 w/ LVEF of 30%.  Advanced therapies:  Mr. Shindledecker had a CPX with a peak VO2 of 10.9 and severely elevated VE/VCO2 slope.  Now on milrinone 0.328mcg/kg/min. He is not interested in pursuing LVAD therapy and understands that he is high risk due to underlying lung disease. Recent DLCO less than 50% however not corrected for Hgb. Will follow up with Dr. Marchelle Gearing soon. He is going on a trip to [place]; we will start weaning milrinone at his next follow up.   2. Pulmonary embolism  - CT from 01/05/22 w/ acute PE involving lobar and segmental branches at the RLL, RML - off DOAC - No dyspnea; off AC.   3. COPD - has not required home O2; stable today; does not report history of frequent exacerbations.  - PFTs with severely reduced DLCO - Will start pulmonary rehab; remains pending; reports that he is unable to start due to PICC line placement. Will look into this.  - Follow up with Dr. Marchelle Gearing next month.  - stable; no wheezing on exam  4. Tobacco abuse - He has now stopped smoking.   5. Chronic palliative inotropes - continue milrinone 0.373mcg/kg/min - will attempt to wean to 0.74mcg/kg/min at follow up visit.  - discussed possibility of CCM therapy; he is too high risk at this  point but may be a candidate in the future.  - BMP/BNP today.  6. PICC line occlusion - Discussed with IV team and nursing staff today.  - Now s/p TPA with resolution of low flow / occlusion.    I spent 45 minutes caring for this patient today including face to face time, ordering and reviewing labs, discussing milrinone wean & CCM  therapy, seeing the patient, documenting in the record, and arranging follow ups.     Marce Schartz Advanced Heart Failure Mechanical Circulatory Support

## 2023-06-02 ENCOUNTER — Ambulatory Visit (HOSPITAL_COMMUNITY)
Admission: RE | Admit: 2023-06-02 | Discharge: 2023-06-02 | Disposition: A | Discharge status: Home / Self Care | Payer: Medicare Other | Source: Ambulatory Visit | Attending: Cardiology | Admitting: Cardiology

## 2023-06-02 ENCOUNTER — Encounter (HOSPITAL_COMMUNITY): Payer: Self-pay | Admitting: Cardiology

## 2023-06-02 VITALS — BP 124/82 | HR 64 | Ht 69.5 in | Wt 163.0 lb

## 2023-06-02 DIAGNOSIS — J849 Interstitial pulmonary disease, unspecified: Secondary | ICD-10-CM

## 2023-06-02 DIAGNOSIS — Z905 Acquired absence of kidney: Secondary | ICD-10-CM | POA: Insufficient documentation

## 2023-06-02 DIAGNOSIS — I110 Hypertensive heart disease with heart failure: Secondary | ICD-10-CM | POA: Diagnosis not present

## 2023-06-02 DIAGNOSIS — Z86711 Personal history of pulmonary embolism: Secondary | ICD-10-CM | POA: Diagnosis not present

## 2023-06-02 DIAGNOSIS — I5022 Chronic systolic (congestive) heart failure: Secondary | ICD-10-CM | POA: Diagnosis not present

## 2023-06-02 DIAGNOSIS — R7889 Finding of other specified substances, not normally found in blood: Secondary | ICD-10-CM

## 2023-06-02 DIAGNOSIS — I5084 End stage heart failure: Secondary | ICD-10-CM | POA: Insufficient documentation

## 2023-06-02 DIAGNOSIS — I2694 Multiple subsegmental pulmonary emboli without acute cor pulmonale: Secondary | ICD-10-CM | POA: Diagnosis not present

## 2023-06-02 DIAGNOSIS — Z7984 Long term (current) use of oral hypoglycemic drugs: Secondary | ICD-10-CM | POA: Insufficient documentation

## 2023-06-02 DIAGNOSIS — J449 Chronic obstructive pulmonary disease, unspecified: Secondary | ICD-10-CM | POA: Insufficient documentation

## 2023-06-02 DIAGNOSIS — Z87891 Personal history of nicotine dependence: Secondary | ICD-10-CM | POA: Insufficient documentation

## 2023-06-02 DIAGNOSIS — Z79899 Other long term (current) drug therapy: Secondary | ICD-10-CM | POA: Diagnosis not present

## 2023-06-02 DIAGNOSIS — Z7901 Long term (current) use of anticoagulants: Secondary | ICD-10-CM | POA: Diagnosis not present

## 2023-06-02 DIAGNOSIS — E119 Type 2 diabetes mellitus without complications: Secondary | ICD-10-CM | POA: Diagnosis not present

## 2023-06-02 LAB — BASIC METABOLIC PANEL
Anion gap: 14 (ref 5–15)
BUN: 28 mg/dL — ABNORMAL HIGH (ref 8–23)
CO2: 25 mmol/L (ref 22–32)
Calcium: 9.9 mg/dL (ref 8.9–10.3)
Chloride: 97 mmol/L — ABNORMAL LOW (ref 98–111)
Creatinine, Ser: 1.43 mg/dL — ABNORMAL HIGH (ref 0.61–1.24)
GFR, Estimated: 52 mL/min — ABNORMAL LOW (ref >60)
Glucose, Bld: 179 mg/dL — ABNORMAL HIGH (ref 70–99)
Potassium: 3.8 mmol/L (ref 3.5–5.1)
Sodium: 136 mmol/L (ref 135–145)

## 2023-06-02 LAB — DIGOXIN LEVEL: Digoxin Level: 0.5 ng/mL — ABNORMAL LOW (ref 0.8–2.0)

## 2023-06-02 LAB — BRAIN NATRIURETIC PEPTIDE: B Natriuretic Peptide: 254.4 pg/mL — ABNORMAL HIGH (ref 0.0–100.0)

## 2023-06-02 NOTE — Patient Instructions (Addendum)
Medication Changes:  STOP ELIQUIS (APIXABAN)   Lab Work:  Labs done today, your results will be available in MyChart, we will contact you for abnormal readings.  Referrals:  YOU HAVE BEEN REFERRED TO ELECTROPHYSIOLOGY THEY WILL REACH OUT TO YOU OR CALL TO ARRANGE THIS. PLEASE CALL us WITH ANY CONCERNS   Follow-Up in: 1 MONTH AS SCHEDULED WITH DR. Gasper Lloyd   At the [facility], you and your health needs are our priority. We have a designated team specialized in the treatment of Heart Failure. This Care Team includes your primary Heart Failure Specialized Cardiologist (physician), Advanced Practice Providers (APPs- Physician Assistants and Nurse Practitioners), and Pharmacist who all work together to provide you with the care you need, when you need it.   You may see any of the following providers on your designated Care Team at your next follow up:  Dr. Arvilla Meres Dr. Marca Ancona Dr. Dorthula Nettles Dr. Theresia Bough Tonye Becket, NP Robbie Lis, [place] [facility] [place], [place] Brynda Peon, NP [place] Lee, NP Karle Plumber, PharmD   Please be sure to bring in all your medications bottles to every appointment.   Need to Contact us:  If you have any questions or concerns before your next appointment please send [place] a message through [place] or call our office at [phone].    TO LEAVE A MESSAGE FOR THE NURSE SELECT OPTION 2, PLEASE LEAVE A MESSAGE INCLUDING: YOUR NAME DATE OF BIRTH CALL BACK NUMBER REASON FOR CALL**this is important as we prioritize the call backs  YOU WILL RECEIVE A CALL BACK THE SAME DAY AS LONG AS YOU CALL BEFORE 4:00 PM

## 2023-06-04 ENCOUNTER — Encounter: Payer: Self-pay | Admitting: Internal Medicine

## 2023-06-04 ENCOUNTER — Ambulatory Visit: Payer: Medicare Other | Admitting: Internal Medicine

## 2023-06-04 VITALS — BP 136/83 | HR 105 | Ht 69.0 in | Wt 175.0 lb

## 2023-06-04 DIAGNOSIS — R053 Chronic cough: Secondary | ICD-10-CM | POA: Diagnosis not present

## 2023-06-04 DIAGNOSIS — Z86711 Personal history of pulmonary embolism: Secondary | ICD-10-CM

## 2023-06-04 DIAGNOSIS — J849 Interstitial pulmonary disease, unspecified: Secondary | ICD-10-CM | POA: Diagnosis not present

## 2023-06-04 LAB — CBC WITH DIFFERENTIAL/PLATELET
Basophils Absolute: 0.1 10*3/uL (ref 0.0–0.1)
Basophils Relative: 1 % (ref 0.0–3.0)
Eosinophils Absolute: 0.4 10*3/uL (ref 0.0–0.7)
Eosinophils Relative: 5.3 % — ABNORMAL HIGH (ref 0.0–5.0)
HCT: 37.2 % — ABNORMAL LOW (ref 39.0–52.0)
Hemoglobin: 12.4 g/dL — ABNORMAL LOW (ref 13.0–17.0)
Lymphocytes Relative: 21.8 % (ref 12.0–46.0)
Lymphs Abs: 1.8 10*3/uL (ref 0.7–4.0)
MCHC: 33.3 g/dL (ref 30.0–36.0)
MCV: 94.2 fL (ref 78.0–100.0)
Monocytes Absolute: 0.4 10*3/uL (ref 0.1–1.0)
Monocytes Relative: 5.3 % (ref 3.0–12.0)
Neutro Abs: 5.5 10*3/uL (ref 1.4–7.7)
Neutrophils Relative %: 66.6 % (ref 43.0–77.0)
Platelets: 248 10*3/uL (ref 150.0–400.0)
RBC: 3.95 Mil/uL — ABNORMAL LOW (ref 4.22–5.81)
RDW: 15.1 % (ref 11.5–15.5)
WBC: 8.2 10*3/uL (ref 4.0–10.5)

## 2023-06-04 LAB — D-DIMER, QUANTITATIVE: D-Dimer, Quant: 0.68 ug{FEU}/mL — ABNORMAL HIGH (ref <0.50)

## 2023-06-04 MED ORDER — ARNUITY ELLIPTA 100 MCG/ACT IN AEPB: 1.0000 | INHALATION_SPRAY | Freq: Every day | RESPIRATORY_TRACT | 11 refills | Status: DC

## 2023-06-04 NOTE — Patient Instructions (Addendum)
ILD (interstitial lung disease) (HCC)  - clinically stable  Plan  - given advanced CHF recommend medicines (anti fibrotics) if there is progression  -= do HRCT and spiro/dlci on  3months  Chronic cough  - need to rule out asthma  Plan  - check cbc with diff, IgE and RAST - try inhaler arnuity for a month and see if it hel;ps   History of pulmonary embolism summer 2023  Seems off DOAC per cardas  Plan  - check d-dimer to asess recurrence risk  Follwuop 3 months or sooner if needed; 15 min

## 2023-06-04 NOTE — Progress Notes (Signed)
Patient ID: Phillip Rich, male    DOB: [birth year], 73 y.o.   MRN: [id number]  HPI  Problem list 1. Dyspnea  - multifactorial due to obesity, new dx of chronic ILD, and acute worsening June 2012 due to narcs/benzo in setting post op knee replacment. Did not desaturate 185 feet x 3 laps mid June 2012 2. Chronic cough - since feb 2012. Multifactorial - ACe inhibitor, GERD, ILD 3. Mediastinal node - 2.8cm R Hilar mass n CT 10/15/2010 4. ILD - noted during dyspnea eval in June 2012 5. Tobacco abuse  OV 12/10/2010: Followup for all of above. Dyspnea is much improved after he stopped narcs/benzo. In fact, resolved. Cough still present though possibly better - ? Still on ace inhibitor. GERD is active per patient and diet not great. STill smoking too; knows he has to quit but addicted and struggles. Prior has quit but relapsed due to significant other. CT chest (reviewed) followup now shows resolution of hilar mass but persistence of GGO without honeycombing. PFTs, correlate with pulmonary infiltrates with isolated low DLCO 54% (otherwise normal). He has lot of questions about ILD  Past, Family, Social reviewed: no changes noted and documented below      OV 06/04/2023  Subjective:  Patient ID: Phillip Rich, male , DOB: 09/07/1950 , age 57 y.o. , MRN: [id number] , ADDRESS: [address] [place] [address] PCP Emilio Aspen, MD Patient Care Team: Emilio Aspen, MD as PCP - General (Internal Medicine) Rollene Rotunda, MD as PCP - Cardiology (Cardiology)  This Provider for this visit: Treatment Team:  Attending Provider: Kalman Shan, MD    06/04/2023 -   Chief Complaint  Patient presents with   Hospitalization Follow-up    Hospital f/u 01/27/23.  F/u ILD CT 9/18  Pt denies any concerns, has albuterol for prn but does not use it. Using      HPI [name] 73 y.o. -returns for follow-up.  I personally saw him in 2012 for chronic cough and early ILD.   Then after that he was lost to follow-up I saw him in the hospital September 2024 for CHF and elevated evaluation.  Since then he has not undergone LVAD and decided against it because of lifestyle issues [he lives alone] and also relative contraindication given his comorbidities.  He is now stable on milrinone infusion with the PICC line.  CCM therapy is being considered based on external record review of heart failure that he just visited.  He feels stable.  He gets short of breath sometimes with changing clothes but definitely with heavy exertion.  No orthopnea no paroxysmal nocturnal dyspnea no edema weight is stable.  He is reporting chronic cough with yellow sputum.  Recent blood eosinophil is elevated.  Not on asthma therapy but he says his mom has asthma.  He is agreeable to get blood allergy workup done.  He is also willing to try an inhaled steroid.  I will hold off on long-acting beta agonist for him given his cardiac issues.  For his ILD I noted that he is not had c high-res CT in over a year.    Simple office walk 224 (66+46 x 2) feet Pod A at [facility] x  3 laps goal with forehead probe 06/04/2023    O2 used ra   Number laps completed Sist and at x 15   Comments about pace Normal on mlirinone   Resting Pulse Ox/HR 98% and 105/min   Final Pulse  Ox/HR 94% and 121/min   Desaturated </= 88% no   Desaturated <= 3% points no   Got Tachycardic >/= 90/min yes   Symptoms at end of test NO   Miscellaneous comments Baseline tachy in chf patient     PFT    Latest Ref Rng & Units 01/29/2023   12:01 PM  PFT Results  FVC-Pre L 3.49   FVC-Predicted Pre % 80   FVC-Post L 3.46   FVC-Predicted Post % 79   Pre FEV1/FVC % % 78   Post FEV1/FCV % % 50   FEV1-Pre L 2.71   FEV1-Predicted Pre % 85   FEV1-Post L 1.72   DLCO uncorrected ml/min/mmHg 8.58   DLCO UNC% % 33   DLVA Predicted % 45   TLC L 5.19   TLC % Predicted % 73   RV % Predicted % 66        LAB RESULTS last 96 hours No  results found.  LAB RESULTS last 90 days Recent Results (from the past 2160 hours)  Basic Metabolic Panel (BMET)     Status: Abnormal   Collection Time: 03/23/23  3:30 PM  Result Value Ref Range   Sodium 138 135 - 145 mmol/L   Potassium 4.7 3.5 - 5.1 mmol/L    Comment: HEMOLYSIS AT THIS LEVEL MAY AFFECT RESULT   Chloride 100 98 - 111 mmol/L   CO2 28 22 - 32 mmol/L   Glucose, Bld 147 (H) 70 - 99 mg/dL    Comment: Glucose reference range applies only to samples taken after fasting for at least 8 hours.   BUN 24 (H) 8 - 23 mg/dL   Creatinine, Ser 1.47 (H) 0.61 - 1.24 mg/dL   Calcium 9.2 8.9 - 82.9 mg/dL   GFR, Estimated 59 (L) >60 mL/min    Comment: (NOTE) Calculated using the CKD-EPI Creatinine Equation (2021)    Anion gap 10 5 - 15    Comment: Performed at [facility], [address] [place], [place] [address]  B Nat Peptide     Status: Abnormal   Collection Time: 03/23/23  3:30 PM  Result Value Ref Range   B Natriuretic Peptide 355.7 (H) 0.0 - 100.0 pg/mL    Comment: Performed at [facility], [address], [place], [place] [address]  B Nat Peptide     Status: Abnormal   Collection Time: 04/01/23  3:37 PM  Result Value Ref Range   BNP 224.1 (H) 0.0 - 100.0 pg/mL    Comment: Siemens ADVIA Centaur XP methodology  Basic Metabolic Panel (BMET)     Status: Abnormal   Collection Time: 04/01/23  3:38 PM  Result Value Ref Range   Glucose 141 (H) 70 - 99 mg/dL   BUN 34 (H) 8 - 27 mg/dL   Creatinine, Ser 8.46 (H) 0.76 - 1.27 mg/dL   eGFR 46 (L) >96 EX/BMW/4.13   BUN/Creatinine Ratio 21 10 - 24   Sodium 138 134 - 144 mmol/L   Potassium 4.6 3.5 - 5.2 mmol/L   Chloride 96 96 - 106 mmol/L   CO2 20 20 - 29 mmol/L   Calcium 9.9 8.6 - 10.2 mg/dL  Basic Metabolic Panel (BMET)     Status: Abnormal   Collection Time: 04/22/23  2:45 PM  Result Value Ref Range   Sodium 136 135 - 145 mmol/L   Potassium 3.9 3.5 - 5.1 mmol/L   Chloride 103 98 - 111 mmol/L   CO2 25 22 -  32  mmol/L   Glucose, Bld 166 (H) 70 - 99 mg/dL    Comment: Glucose reference range applies only to samples taken after fasting for at least 8 hours.   BUN 19 8 - 23 mg/dL   Creatinine, Ser 1.61 0.61 - 1.24 mg/dL   Calcium 8.9 8.9 - 09.6 mg/dL   GFR, Estimated >04 >54 mL/min    Comment: (NOTE) Calculated using the CKD-EPI Creatinine Equation (2021)    Anion gap 8 5 - 15    Comment: Performed at [facility], [address], [place], [place] [address]  B Nat Peptide     Status: Abnormal   Collection Time: 04/22/23  2:45 PM  Result Value Ref Range   B Natriuretic Peptide 369.7 (H) 0.0 - 100.0 pg/mL    Comment: Performed at [facility], [address], [place], [place] [address]  Basic Metabolic Panel (BMET)     Status: Abnormal   Collection Time: 06/02/23  2:19 PM  Result Value Ref Range   Sodium 136 135 - 145 mmol/L   Potassium 3.8 3.5 - 5.1 mmol/L   Chloride 97 (L) 98 - 111 mmol/L   CO2 25 22 - 32 mmol/L   Glucose, Bld 179 (H) 70 - 99 mg/dL    Comment: Glucose reference range applies only to samples taken after fasting for at least 8 hours.   BUN 28 (H) 8 - 23 mg/dL   Creatinine, Ser 2.95 (H) 0.61 - 1.24 mg/dL   Calcium 9.9 8.9 - 62.1 mg/dL   GFR, Estimated 52 (L) >60 mL/min    Comment: (NOTE) Calculated using the CKD-EPI Creatinine Equation (2021)    Anion gap 14 5 - 15    Comment: Performed at [facility], [address], [place], [place] [address]  B Nat Peptide     Status: Abnormal   Collection Time: 06/02/23  2:19 PM  Result Value Ref Range   B Natriuretic Peptide 254.4 (H) 0.0 - 100.0 pg/mL    Comment: Performed at [facility], [address] [place], [place] [address]  Digoxin level     Status: Abnormal   Collection Time: 06/02/23  2:19 PM  Result Value Ref Range   Digoxin Level 0.5 (L) 0.8 - 2.0 ng/mL    Comment: Performed at [facility], [address], [place], [place] [address]         has a past medical history of Anxiety,  BPH (benign prostatic hyperplasia), Complication of anesthesia, Diabetes mellitus, DJD (degenerative joint disease), GERD (gastroesophageal reflux disease), HTN (hypertension), Hyperlipidemia, ILD (interstitial lung disease) (HCC), Left renal mass, Melanoma (HCC), Mild sleep apnea, and PONV (postoperative nausea and vomiting).   reports that he quit smoking about 19 months ago. His smoking use included cigarettes. He started smoking about 51 years ago. He has a 50 pack-year smoking history. He has never used smokeless tobacco.  Past Surgical History:  Procedure Laterality Date   COLONOSCOPY WITH PROPOFOL N/A 04/03/2014   Procedure: COLONOSCOPY WITH PROPOFOL;  Surgeon: Charolett Bumpers, MD;  Location: [facility] ENDOSCOPY;  Service: Endoscopy;  Laterality: N/A;   IR FLUORO GUIDE CV LINE RIGHT  02/04/2023   IR US GUIDE VASC ACCESS RIGHT  02/04/2023   KNEE ARTHROSCOPY Right    x2 right, x5 left   LASIK     MELANOMA EXCISION Left    NEPHRECTOMY Left 2022   partial   PROSTATE ABLATION     RIGHT  HEART CATH N/A 08/05/2022   Procedure: RIGHT HEART CATH;  Surgeon: Dorthula Nettles, DO;  Location: [facility] INVASIVE CV LAB;  Service: Cardiovascular;  Laterality: N/A;   RIGHT HEART CATH N/A 01/28/2023   Procedure: RIGHT HEART CATH;  Surgeon: Laurey Morale, MD;  Location: [facility] INVASIVE CV LAB;  Service: Cardiovascular;  Laterality: N/A;   RIGHT/LEFT HEART CATH AND CORONARY ANGIOGRAPHY N/A 01/15/2022   Procedure: RIGHT/LEFT HEART CATH AND CORONARY ANGIOGRAPHY;  Surgeon: Iran Ouch, MD;  Location: [facility] INVASIVE CV LAB;  Service: Cardiovascular;  Laterality: N/A;   TOTAL KNEE ARTHROPLASTY     left   VASECTOMY      Allergies  Allergen Reactions   Codeine Nausea And Vomiting   Crestor [Rosuvastatin Calcium] Other (See Comments)    Leg aches   Erythromycin Rash    All Mycin drugs    Immunization History  Administered Date(s) Administered   Fluad Quad(high Dose 65+) 01/20/2022   Influenza,inj,Quad PF,6+ Mos  01/15/2019   PFIZER(Purple Top)SARS-COV-2 Vaccination 07/08/2019, 08/05/2019, 05/07/2020   PNEUMOCOCCAL CONJUGATE-20 08/22/2022    Family History  Problem Relation Age of Onset   Lung cancer Mother    Heart disease Father    Asthma Sister    Asthma Sister    Colon cancer Sister    Rheum arthritis Sister        x3     Current Outpatient Medications:    albuterol (VENTOLIN HFA) 108 (90 Base) MCG/ACT inhaler, Inhale 1-2 puffs into the lungs every 6 (six) hours as needed for wheezing or shortness of breath., Disp: 1 each, Rfl: 0   aspirin EC 81 MG tablet, Take 81 mg by mouth daily. Swallow whole., Disp: , Rfl:    atorvastatin (LIPITOR) 80 MG tablet, Take 1 tablet (80 mg total) by mouth daily., Disp: 90 tablet, Rfl: 3   Cholecalciferol (VITAMIN D PO), Take 1,000 Units by mouth every morning., Disp: , Rfl:    digoxin (LANOXIN) 0.125 MG tablet, Take 1 tablet (0.125 mg total) by mouth daily., Disp: 30 tablet, Rfl: 6   digoxin (LANOXIN) 0.125 MG tablet, Take 1 tablet (125 mcg total) by mouth daily., Disp: 90 tablet, Rfl: 0   DULoxetine (CYMBALTA) 60 MG capsule, Take 60 mg by mouth daily., Disp: , Rfl:    empagliflozin (JARDIANCE) 10 MG TABS tablet, Take 1 tablet (10 mg total) by mouth daily before breakfast., Disp: 90 tablet, Rfl: 3   fluticasone (FLONASE) 50 MCG/ACT nasal spray, Place 1 spray into both nostrils 2 (two) times a week., Disp: , Rfl:    Fluticasone Furoate (ARNUITY ELLIPTA) 100 MCG/ACT AEPB, Inhale 1 puff into the lungs daily., Disp: 30 each, Rfl: 11   levocetirizine (XYZAL) 5 MG tablet, Take 5 mg by mouth daily., Disp: , Rfl:    Melatonin 10 MG TABS, Take 10 mg by mouth at bedtime as needed (Sleep)., Disp: , Rfl:    metFORMIN (GLUCOPHAGE) 1000 MG tablet, Take 1,000 mg by mouth 2 (two) times daily. , Disp: , Rfl:    milrinone (PRIMACOR) 20 MG/100 ML SOLN infusion, Inject 0.0306 mg/min into the vein continuous., Disp: , Rfl:    omeprazole (PRILOSEC) 20 MG capsule, Take 1 tablet by  mouth every morning. , Disp: , Rfl:    sacubitril-valsartan (ENTRESTO) 24-26 MG, Take 1 tablet by mouth 2 (two) times daily. 1 tablet in the am and 1/2 tablet in the evening, Disp: , Rfl:    Semaglutide,0.25 or 0.5MG /DOS, (OZEMPIC, 0.25 OR 0.5 MG/DOSE,) 2 MG/3ML SOPN,  Inject 0.5 mg into the skin once a week., Disp: , Rfl:    spironolactone (ALDACTONE) 25 MG tablet, Take 0.5 tablets (12.5 mg total) by mouth daily., Disp: 45 tablet, Rfl: 5   torsemide (DEMADEX) 20 MG tablet, Take 2 tablets (40 mg total) by mouth daily., Disp: 60 tablet, Rfl: 3   vitamin B-12 (CYANOCOBALAMIN) 100 MCG tablet, Take 100 mcg by mouth daily., Disp: , Rfl:    zolpidem (AMBIEN) 10 MG tablet, Take 10 mg by mouth at bedtime as needed for sleep., Disp: , Rfl:       Objective:   Vitals:   06/04/23 1328  BP: 136/83  Pulse: (!) 105  SpO2: 97%  Weight: 175 lb (79.4 kg)  Height: 5\' 9"  (1.753 m)    Estimated body mass index is 25.84 kg/m as calculated from the following:   Height as of this encounter: 5\' 9"  (1.753 m).   Weight as of this encounter: 175 lb (79.4 kg).  @WEIGHTCHANGE @  Filed Weights   06/04/23 1328  Weight: 175 lb (79.4 kg)     Physical Exam   General: No distress. Looks well O2 at rest: no Cane present: no Sitting in wheel chair: no Frail: no Obese: no Neuro: Alert and Oriented x 3. GCS 15. Speech normal Psych: Pleasant Resp:  Barrel Chest - no.  Wheeze - no, Crackles - MILD FAINT, No overt respiratory distress CVS: Normal heart sounds. Murmurs - no Ext: Stigmata of Connective Tissue Disease - no HEENT: Normal upper airway. PEERL +. No post nasal drip        Assessment:       ICD-10-CM   1. ILD (interstitial lung disease) (HCC)  J84.9 Perennial allergen profile IgE    CBC w/Diff    CT Chest High Resolution    Pulmonary function test    D-Dimer, Quantitative    2. Chronic cough  R05.3 Perennial allergen profile IgE    CBC w/Diff    CT Chest High Resolution    Pulmonary  function test    D-Dimer, Quantitative    3. History of pulmonary embolism  Z86.711 Perennial allergen profile IgE    CBC w/Diff    CT Chest High Resolution    Pulmonary function test         Plan:     Patient Instructions  ILD (interstitial lung disease) (HCC)  - clinically stable  Plan  - given advanced CHF recommend medicines (anti fibrotics) if there is progression  -= do HRCT and spiro/dlci on  3months  Chronic cough  - need to rule out asthma  Plan  - check cbc with diff, IgE and RAST - try inhaler arnuity for a month and see if it hel;ps   History of pulmonary embolism summer 2023  Seems off DOAC per cardas  Plan  - check d-dimer to asess recurrence risk  Follwuop 3 months or sooner if needed; 15 min    FOLLOWUP Return in about 3 months (around 09/02/2023) for 15 min visit, after Cleda Daub and DLCO, after HRCT chest, ILD.    ( Level 05 visit E&M 2024: Estb >= 40 min   in  visit type: on-site physical face to visit  in total care time and counseling or/and coordination of care by this undersigned MD - Dr Kalman Shan. This includes one or more of the following on this same day 06/04/2023: pre-charting, chart review, note writing, documentation discussion of test results, diagnostic or treatment recommendations, prognosis, risks and benefits of  management options, instructions, education, compliance or risk-factor reduction. It excludes time spent by the CMA or office staff in the care of the patient. Actual time 74 min);e  SIGNATURE    Dr. Kalman Shan, M.D., F.C.C.P,  Pulmonary and Critical Care Medicine Staff Physician, [facility] Center Director - Interstitial Lung Disease  Program  Pulmonary Fibrosis [facility] [place], [place], [address]  Pager: [phone], If no answer or between  15:00h - 7:00h: call [phone] Telephone: [phone]  2:05 PM 06/04/2023

## 2023-06-06 LAB — ALLERGEN PROFILE, PERENNIAL ALLERGEN IGE
Alternaria Alternata IgE: <0.1 kU/L
Aspergillus Fumigatus IgE: <0.1 kU/L
Aureobasidi Pullulans IgE: <0.1 kU/L
Candida Albicans IgE: <0.1 kU/L
Cat Dander IgE: <0.1 kU/L
Chicken Feathers IgE: <0.1 kU/L
Cladosporium Herbarum IgE: <0.1 kU/L
Cow Dander IgE: <0.1 kU/L
D Farinae IgE: 0.1 kU/L — AB
D Pteronyssinus IgE: 0.12 kU/L — AB
Dog Dander IgE: <0.1 kU/L
Duck Feathers IgE: <0.1 kU/L
Goose Feathers IgE: <0.1 kU/L
Mouse Urine IgE: <0.1 kU/L
Mucor Racemosus IgE: <0.1 kU/L
Penicillium Chrysogen IgE: <0.1 kU/L
Phoma Betae IgE: <0.1 kU/L
Setomelanomma Rostrat: <0.1 kU/L
Stemphylium Herbarum IgE: <0.1 kU/L

## 2023-06-08 DIAGNOSIS — I5023 Acute on chronic systolic (congestive) heart failure: Secondary | ICD-10-CM | POA: Diagnosis not present

## 2023-06-08 DIAGNOSIS — I429 Cardiomyopathy, unspecified: Secondary | ICD-10-CM | POA: Diagnosis not present

## 2023-06-10 ENCOUNTER — Other Ambulatory Visit (HOSPITAL_COMMUNITY): Payer: Self-pay | Admitting: Cardiology

## 2023-06-15 DIAGNOSIS — I429 Cardiomyopathy, unspecified: Secondary | ICD-10-CM | POA: Diagnosis not present

## 2023-06-15 DIAGNOSIS — I5021 Acute systolic (congestive) heart failure: Secondary | ICD-10-CM | POA: Diagnosis not present

## 2023-06-15 DIAGNOSIS — I5023 Acute on chronic systolic (congestive) heart failure: Secondary | ICD-10-CM | POA: Diagnosis not present

## 2023-06-22 DIAGNOSIS — I429 Cardiomyopathy, unspecified: Secondary | ICD-10-CM | POA: Diagnosis not present

## 2023-06-22 DIAGNOSIS — I5023 Acute on chronic systolic (congestive) heart failure: Secondary | ICD-10-CM | POA: Diagnosis not present

## 2023-06-29 DIAGNOSIS — I5023 Acute on chronic systolic (congestive) heart failure: Secondary | ICD-10-CM | POA: Diagnosis not present

## 2023-06-29 DIAGNOSIS — I429 Cardiomyopathy, unspecified: Secondary | ICD-10-CM | POA: Diagnosis not present

## 2023-07-02 NOTE — Progress Notes (Signed)
 ADVANCED HEART FAILURE CLINIC NOTE  Referring Physician: Emilio Aspen, *  Primary Care: Emilio Aspen, MD Primary Cardiologist: N/A  HPI: JAKORY MATSUO is a 73 y.o. male with T2DM, HTN, renal mass s/p nephrectomy, COPD that presented to the [facility] ER w/ acute onset SOB on 08/27 when he was diagnosed with acute PE and HFrEF w/ an LVEF of 25%-30%; during admission, coronary angiography was significant for CTO of the RCA and diffusely diseased Lcx. RHC w/ elevated pre and post capillary filling pressures. He was diuresed and discharged home and started on some GDMT. Since that time, Mr. Schippers has had a CMR demonstrating persistently reduced LV function (EF 20%) and CPX with severe cardiac limitations.   He was directly admitted for low output heart failure in late 024.  During that time right heart catheterization consistent with severely reduced cardiac index.  He was started on IV inotropes and underwent LVAD evaluation.  Unfortunately LVAD evaluation was placed on hold after CT chest demonstrated severe lung disease with severely reduced DLCO.  He was ultimately discharged home on IV milrinone.  Since initiation of milrinone he has had significant improvement in quality of life.   Interval hx:  - Currently on milrinone 0.359mcg/kg/min; reports that he has been doing fairly well on this dose.  - Remains very motivated to come off of milrinone; he has been traveling with minimal limitations; he does need to stop after walking long distance. No lightheadedness, LE edema, PND at this time.   Activity level/exercise tolerance:  NYHA IIB-III on chronic IV inotropes Orthopnea:  Sleeps on 2 pillows Paroxysmal noctural dyspnea:  No Chest pain/pressure:  No Orthostatic lightheadedness:  No Palpitations:  No Lower extremity edema:  Minimal pretibial edema Presyncope/syncope:  No Cough:  No    Current Outpatient Medications  Medication Sig Dispense Refill   albuterol  (VENTOLIN HFA) 108 (90 Base) MCG/ACT inhaler Inhale 1-2 puffs into the lungs every 6 (six) hours as needed for wheezing or shortness of breath. 1 each 0   aspirin EC 81 MG tablet Take 81 mg by mouth daily. Swallow whole.     atorvastatin (LIPITOR) 80 MG tablet Take 1 tablet (80 mg total) by mouth daily. 90 tablet 3   Cholecalciferol (VITAMIN D PO) Take 1,000 Units by mouth every morning.     digoxin (LANOXIN) 0.125 MG tablet Take 1 tablet (0.125 mg total) by mouth daily. 30 tablet 6   digoxin (LANOXIN) 0.125 MG tablet Take 1 tablet (125 mcg total) by mouth daily. 90 tablet 0   DULoxetine (CYMBALTA) 60 MG capsule Take 60 mg by mouth daily.     empagliflozin (JARDIANCE) 10 MG TABS tablet Take 1 tablet (10 mg total) by mouth daily before breakfast. 90 tablet 3   fluticasone (FLONASE) 50 MCG/ACT nasal spray Place 1 spray into both nostrils 2 (two) times a week.     Fluticasone Furoate (ARNUITY ELLIPTA) 100 MCG/ACT AEPB Inhale 1 puff into the lungs daily. 30 each 11   levocetirizine (XYZAL) 5 MG tablet Take 5 mg by mouth daily.     Melatonin 10 MG TABS Take 10 mg by mouth at bedtime as needed (Sleep).     metFORMIN (GLUCOPHAGE) 1000 MG tablet Take 1,000 mg by mouth 2 (two) times daily.      milrinone (PRIMACOR) 20 MG/100 ML SOLN infusion Inject 0.0306 mg/min into the vein continuous.     omeprazole (PRILOSEC) 20 MG capsule Take 1 tablet by mouth every morning.  sacubitril-valsartan (ENTRESTO) 24-26 MG Take 1 tablet by mouth 2 (two) times daily. 1 tablet in the am and 1/2 tablet in the evening     Semaglutide,0.25 or 0.5MG /DOS, (OZEMPIC, 0.25 OR 0.5 MG/DOSE,) 2 MG/3ML SOPN Inject 0.5 mg into the skin once a week.     spironolactone (ALDACTONE) 25 MG tablet Take 0.5 tablets (12.5 mg total) by mouth daily. 45 tablet 5   torsemide (DEMADEX) 20 MG tablet Take 2 tablets (40 mg total) by mouth daily. 60 tablet 3   vitamin B-12 (CYANOCOBALAMIN) 100 MCG tablet Take 100 mcg by mouth daily.     zolpidem  (AMBIEN) 10 MG tablet Take 10 mg by mouth at bedtime as needed for sleep.     No current facility-administered medications for this visit.     PHYSICAL EXAM: Vitals:   07/03/23 1353  BP: (!) 92/56  Pulse: (!) 112  SpO2: 98%   GENERAL: NAD Lungs- cta CARDIAC:  JVP: 6 cm          Normal rate with regular rhythm. no murmur.  Pulses 2+. no edema.  ABDOMEN: Soft, non-tender, non-distended.  EXTREMITIES: Warm and well perfused.  NEUROLOGIC: No obvious FND    DATA REVIEW  ECG: 02/28/22: NSR  ECHO: TTE 01/10/22: LVEF 15%-20%, RV function moderately reduced. Moderate MR.  TTE 11/25/22: LVEF 30%,   CMR 04/21/22: 1. Moderate LV dilatation with severe systolic dysfunction (EF 20%). There is akinesis of basal inferior wall and thinning/akinesis of mid to apical anterior wall  2.  Normal RV size with mild systolic dysfunction (EF 40%) 3. Subendocardial late gadolinium enhancement consistent with prior infarct in basal inferior, mid to apical anterior, and apical lateral walls. LGE is less than 50% transmural in basal inferior wall, suggesting viability. LGE is greater than 50% transmural in mid to apical anterior and apical lateral walls, suggesting nonviability in this region.  4.  Moderate tricuspid regurgitation (regurgitant fraction 23%)  5. Mild mitral regurgitation (regurgitant fraction 10%)  CATH: 01/15/22   Ost LM lesion is 20% stenosed.   Prox Cx to Dist Cx lesion is 70% stenosed.   Prox LAD to Mid LAD lesion is 30% stenosed.   Mid RCA lesion is 100% stenosed.   Prox RCA lesion is 70% stenosed.   1.  Significant two-vessel coronary artery disease with chronically occluded mid right coronary artery with left to right as well as bridging collaterals.  The left circumflex is tortuous and diffusely diseased in the midsegment. 2.  Left ventricular angiography was not performed.  EF was severely reduced by echo. 3.  Right heart catheterization showed significantly elevated  right and left-sided filling pressures, moderate to severe pulmonary hypertension and moderately reduced cardiac output.  08/05/22: HEMODYNAMICS: RA:                  6 mmHg (mean) RV:                  27/4-6 mmHg PA:                  28/15 mmHg (21 mean) PCWP:            14 mmHg (mean)                                      Estimated Fick CO/CI   4.5 L/min, 2.2 L/min/m2 Thermodilution CO/CI   4.5 L/min, 2.2 L/min/m2  TPG                 7  mmHg                                              PVR                 1.5 Wood Units  PAPi                2.2    01/28/23:  RA mean 17 RV 57/21 PA 58/30, mean 39 PCWP mean 28  Oxygen saturations: PA 42% AO 95%  Cardiac Output (Fick) 3.37  Cardiac Index (Fick) 1.66   Cardiac Output (Fick) 3.20  Cardiac Index (Fick) 1.58  PVR 3.4 WU   CPX: 06/20/22: Mildly submax test but based on available data there appears to be a severe HF limitations with a blunted BP response to exercise and markedly elevated VeVCO2 slope. Consideration should be given to advanced HF therapies if clinically indicated.   ASSESSMENT & PLAN:  End-stage, Stage D, Heart failure with reduced EF, NYHA III Etiology of WU:XLKGM, ischemic and nonischemic components; now on home palliative inotropes. NYHA class / AHA Stage:IIB-III Volume status & Diuretics:  PRN only Vasodilators: currently taking Entresto 24/26mg  BID.  Beta-Blocker: currently on milrinone 0.350mcg/kg/min; we will reduce to 0.86mcg/kg/min today.  MRA: epleronone 12.5mg  daily (reports gynecomastia).  Cardiometabolic: continue jardiance 10mg  daily Devices therapies & Valvulopathies: CMR w/ LVEF 20%.  Patient has met Dr. Graciela Husbands for ICD evaluation.  In the interim he has had a very concerning CPX.  Repeat right heart cath with mildly reduced cardiac index.  At this time patient not interested in pursuing advanced therapies or ICD.  Advanced therapies:  Mr. Nicol had a  CPX with a peak VO2 of 10.9 and severely elevated VE/VCO2 slope.  Now on milrinone 0.390mcg/kg/min. He is not interested in pursuing LVAD therapy and understands that he is high risk due to underlying lung disease. Recent DLCO less than 50% however not corrected for Hgb. Reducing milrinone 0.54mcg/kg/min.  06/01/22: We had a lengthy discussion today regarding CCM. He is very interested in pursuing therapy. There are small single center publications with use of CCM to help wean off milrinone. Planning to see Dr. Lalla Brothers next month to discuss CCM 07/03/23: Remains motivated to come off milrinone; today we will turn down milrinone to 0.29mcg/kg/min. Discussed risks/benefits extensively today.   2. Pulmonary embolism  - CT from 01/05/22 w/ acute PE involving lobar and segmental branches at the RLL, RML - No dyspnea; off apixaban now  3. COPD - has not required home O2; stable today; does not report history of frequent exacerbations.  - PFTs with severely reduced DLCO - Will start pulmonary rehab; remains pending; reports that he is unable to start due to PICC line placement. Will look into this.  - Follow up with Dr. Marchelle Gearing on 06/04/23. Doing very well.   4. Tobacco abuse - He has now stopped smoking.  - Continued discussing cessation.   5. Chronic palliative inotropes - continue milrinone 0.39mcg/kg/min - will attempt to wean to 0.42mcg/kg/min at follow up after he sees Dr. Lalla Brothers.   I spent 40 minutes caring for this patient today including face to face time, ordering and reviewing labs, discussing attempt at weaning off milrinone, reviewing notes from Dr. Marchelle Gearing (06/04/23), seeing the  patient, documenting in the record, and arranging follow ups.   Cammi Consalvo Advanced Heart Failure Mechanical Circulatory Support

## 2023-07-03 ENCOUNTER — Telehealth: Payer: Self-pay | Admitting: Internal Medicine

## 2023-07-03 ENCOUNTER — Ambulatory Visit (HOSPITAL_COMMUNITY)
Admission: RE | Admit: 2023-07-03 | Discharge: 2023-07-03 | Disposition: A | Discharge status: Home / Self Care | Payer: Medicare Other | Source: Ambulatory Visit | Attending: Cardiology | Admitting: Cardiology

## 2023-07-03 VITALS — BP 92/56 | HR 112 | Wt 173.4 lb

## 2023-07-03 DIAGNOSIS — I2699 Other pulmonary embolism without acute cor pulmonale: Secondary | ICD-10-CM | POA: Diagnosis not present

## 2023-07-03 DIAGNOSIS — I5084 End stage heart failure: Secondary | ICD-10-CM

## 2023-07-03 DIAGNOSIS — N644 Mastodynia: Secondary | ICD-10-CM | POA: Insufficient documentation

## 2023-07-03 DIAGNOSIS — Z7984 Long term (current) use of oral hypoglycemic drugs: Secondary | ICD-10-CM | POA: Insufficient documentation

## 2023-07-03 DIAGNOSIS — I5022 Chronic systolic (congestive) heart failure: Secondary | ICD-10-CM | POA: Insufficient documentation

## 2023-07-03 DIAGNOSIS — E119 Type 2 diabetes mellitus without complications: Secondary | ICD-10-CM | POA: Diagnosis not present

## 2023-07-03 DIAGNOSIS — I110 Hypertensive heart disease with heart failure: Secondary | ICD-10-CM | POA: Diagnosis not present

## 2023-07-03 DIAGNOSIS — Z79899 Other long term (current) drug therapy: Secondary | ICD-10-CM | POA: Diagnosis not present

## 2023-07-03 DIAGNOSIS — Z7985 Long-term (current) use of injectable non-insulin antidiabetic drugs: Secondary | ICD-10-CM | POA: Insufficient documentation

## 2023-07-03 DIAGNOSIS — Z716 Tobacco abuse counseling: Secondary | ICD-10-CM | POA: Insufficient documentation

## 2023-07-03 DIAGNOSIS — Z87891 Personal history of nicotine dependence: Secondary | ICD-10-CM | POA: Insufficient documentation

## 2023-07-03 DIAGNOSIS — I2510 Atherosclerotic heart disease of native coronary artery without angina pectoris: Secondary | ICD-10-CM | POA: Diagnosis not present

## 2023-07-03 DIAGNOSIS — J449 Chronic obstructive pulmonary disease, unspecified: Secondary | ICD-10-CM | POA: Diagnosis not present

## 2023-07-03 LAB — BRAIN NATRIURETIC PEPTIDE: B Natriuretic Peptide: 276.3 pg/mL — ABNORMAL HIGH (ref 0.0–100.0)

## 2023-07-03 LAB — BASIC METABOLIC PANEL
Anion gap: 14 (ref 5–15)
BUN: 39 mg/dL — ABNORMAL HIGH (ref 8–23)
CO2: 24 mmol/L (ref 22–32)
Calcium: 9.8 mg/dL (ref 8.9–10.3)
Chloride: 99 mmol/L (ref 98–111)
Creatinine, Ser: 1.55 mg/dL — ABNORMAL HIGH (ref 0.61–1.24)
GFR, Estimated: 47 mL/min — ABNORMAL LOW (ref >60)
Glucose, Bld: 204 mg/dL — ABNORMAL HIGH (ref 70–99)
Potassium: 4.2 mmol/L (ref 3.5–5.1)
Sodium: 137 mmol/L (ref 135–145)

## 2023-07-03 LAB — DIGOXIN LEVEL: Digoxin Level: 0.7 ng/mL — ABNORMAL LOW (ref 0.8–2.0)

## 2023-07-03 MED ORDER — EPLERENONE 25 MG PO TABS: 12.5000 mg | ORAL_TABLET | Freq: Every day | ORAL | 3 refills | Status: DC

## 2023-07-03 MED ORDER — EMPAGLIFLOZIN 10 MG PO TABS: 10.0000 mg | ORAL_TABLET | Freq: Every day | ORAL | 3 refills | Status: DC

## 2023-07-03 MED ORDER — MILRINONE LACTATE IN DEXTROSE 20-5 MG/100ML-% IV SOLN: 0.2500 ug/kg/min | INTRAVENOUS | Status: DC

## 2023-07-03 NOTE — Telephone Encounter (Signed)
 Hi Phillip Rich -had a pulm embolism in the summer 2023.  I stopped his Eliquis.  He said he had stopped it based on advice.  I checked a D-dimer slightly high.  Usually after the first year I from low-dose Eliquis in patients but obviously this is a risk versus benefit issue but I wanted to share these results with you.  Thanks    SIGNATURE    Dr. Kalman Shan, M.D., F.C.C.P,  Pulmonary and Critical Care Medicine Staff Physician, [facility] Director - Interstitial Lung Disease  Program  Pulmonary Fibrosis [facility] at [facility], [place], [address]   Pager: [phone], If no answer  -> Check AMION or Try [phone] Telephone (clinical office): [phone] Telephone (research): [phone]  4:27 PM 07/03/2023    Latest Reference Range & Units 01/05/22 09:01 06/04/23 14:10  D-Dimer, Quant <0.50 mcg/mL FEU 1.30 (H) 0.68 (H)  (H): Data is abnormally high

## 2023-07-03 NOTE — Patient Instructions (Signed)
 Medication Changes:  MILRINONE DOSAGE HAS BEEN DECREASED TO 0.25MCG/KG/HR- HOME HEALTH IS AWARE TO MAKE THIS CHANGE  STOP SPIRONOLACTONE   START: EPLERENONE 12.5MG  ONCE DAILY   Lab Work:  Labs done today, your results will be available in MyChart, we will contact you for abnormal readings.  Testing/Procedures:  LEFT BREAST ULTRASOUND- SOMEONE WILL CALL YOU TO GET THIS SCHEDULED   Follow-Up in: 1 month as scheduled   At the Advanced [facility] Failure Clinic, you and your health needs are our priority. We have a designated team specialized in the treatment of Heart Failure. This Care Team includes your primary Heart Failure Specialized Cardiologist (physician), Advanced Practice Providers (APPs- Physician Assistants and Nurse Practitioners), and Pharmacist who all work together to provide you with the care you need, when you need it.   You may see any of the following providers on your designated Care Team at your next follow up:  Dr. Arvilla Meres Dr. Marca Ancona Dr. Dorthula Nettles Dr. Theresia Bough Tonye Becket, NP Robbie Lis, [place] [facility] [place], [place] Brynda Peon, NP [place] Lee, NP Karle Plumber, PharmD   Please be sure to bring in all your medications bottles to every appointment.   Need to Contact us:  If you have any questions or concerns before your next appointment please send [place] a message through [place] or call our office at [phone].    TO LEAVE A MESSAGE FOR THE NURSE SELECT OPTION 2, PLEASE LEAVE A MESSAGE INCLUDING: YOUR NAME DATE OF BIRTH CALL BACK NUMBER REASON FOR CALL**this is important as we prioritize the call backs  YOU WILL RECEIVE A CALL BACK THE SAME DAY AS LONG AS YOU CALL BEFORE 4:00 PM

## 2023-07-06 ENCOUNTER — Telehealth: Payer: Self-pay

## 2023-07-06 DIAGNOSIS — I429 Cardiomyopathy, unspecified: Secondary | ICD-10-CM | POA: Diagnosis not present

## 2023-07-06 DIAGNOSIS — I5023 Acute on chronic systolic (congestive) heart failure: Secondary | ICD-10-CM | POA: Diagnosis not present

## 2023-07-06 NOTE — Telephone Encounter (Signed)
 Called HH infusion center to decrease dose of home Milrinone dose to 0.21mcg/kg/min.   Per Dr. Nyoka Cowden office visit note on 2/21   "currently on milrinone 0.379mcg/kg/min; we will reduce to 0.94mcg/kg/min today"  Verbal order provided- medication list updated in Epic, and Jeri Modena aware.

## 2023-07-07 DIAGNOSIS — I429 Cardiomyopathy, unspecified: Secondary | ICD-10-CM | POA: Diagnosis not present

## 2023-07-07 DIAGNOSIS — I5023 Acute on chronic systolic (congestive) heart failure: Secondary | ICD-10-CM | POA: Diagnosis not present

## 2023-07-13 ENCOUNTER — Telehealth (HOSPITAL_COMMUNITY): Payer: Self-pay

## 2023-07-13 DIAGNOSIS — I5023 Acute on chronic systolic (congestive) heart failure: Secondary | ICD-10-CM | POA: Diagnosis not present

## 2023-07-13 DIAGNOSIS — I429 Cardiomyopathy, unspecified: Secondary | ICD-10-CM | POA: Diagnosis not present

## 2023-07-13 NOTE — Telephone Encounter (Signed)
 Cindy with [facility] called to report that one port of his Pic line is occluded. She said the last time this happened we had him come into the office. Please advise on how she should proceed.   Callback#[phone]

## 2023-07-15 ENCOUNTER — Other Ambulatory Visit (HOSPITAL_COMMUNITY): Payer: Self-pay

## 2023-07-15 DIAGNOSIS — I5022 Chronic systolic (congestive) heart failure: Secondary | ICD-10-CM

## 2023-07-15 NOTE — Progress Notes (Signed)
 Orders placed for Cathflow- for occluded picc line.

## 2023-07-15 NOTE — Telephone Encounter (Signed)
 Called and spoke with patient- advised him that he would need to come to the infusion clinic to have this done.   Scheduled patient to go to infusion clinic on Friday 3/7 to have cathflow administered for occluded picc line.    Patient aware of appointment time and date. Orders placed.

## 2023-07-16 ENCOUNTER — Other Ambulatory Visit (HOSPITAL_COMMUNITY): Payer: Self-pay | Admitting: *Deleted

## 2023-07-16 MED ORDER — ALTEPLASE 2 MG IJ SOLR: 2.0000 mg | Freq: Once | INTRAMUSCULAR | Status: AC

## 2023-07-16 NOTE — Addendum Note (Signed)
 Addended by: Noralee Space on: 07/16/2023 03:56 PM   Modules accepted: Orders

## 2023-07-17 ENCOUNTER — Other Ambulatory Visit (HOSPITAL_COMMUNITY): Payer: Self-pay | Admitting: *Deleted

## 2023-07-17 ENCOUNTER — Telehealth (HOSPITAL_COMMUNITY): Payer: Self-pay

## 2023-07-17 ENCOUNTER — Ambulatory Visit (HOSPITAL_COMMUNITY)
Admission: RE | Admit: 2023-07-17 | Discharge: 2023-07-17 | Disposition: A | Discharge status: Home / Self Care | Source: Ambulatory Visit | Attending: Cardiology | Admitting: Cardiology

## 2023-07-17 MED ORDER — MAGNESIUM OXIDE -MG SUPPLEMENT 200 MG PO TABS: 200.0000 mg | ORAL_TABLET | Freq: Every day | ORAL | 11 refills | Status: DC

## 2023-07-17 MED ORDER — ALTEPLASE 2 MG IJ SOLR: 2.0000 mg | Freq: Once | INTRAMUSCULAR | Status: DC

## 2023-07-17 MED ORDER — ALTEPLASE 2 MG IJ SOLR
INTRAMUSCULAR | Status: AC
  Filled 2023-07-17: qty 2

## 2023-07-17 NOTE — Addendum Note (Signed)
 Addended by: Geneal Huebert, Milagros Reap on: 07/17/2023 03:07 PM   Modules accepted: Orders

## 2023-07-17 NOTE — Telephone Encounter (Signed)
 Pt aware.

## 2023-07-17 NOTE — Telephone Encounter (Signed)
 Received lab results from [facility]   Magnesium was 1.8.   Per Tonye Becket, NP have patient start magnesium 200mg  every day. No answer, Left message to return call.

## 2023-07-20 DIAGNOSIS — I5023 Acute on chronic systolic (congestive) heart failure: Secondary | ICD-10-CM | POA: Diagnosis not present

## 2023-07-20 DIAGNOSIS — I429 Cardiomyopathy, unspecified: Secondary | ICD-10-CM | POA: Diagnosis not present

## 2023-07-21 DIAGNOSIS — I10 Essential (primary) hypertension: Secondary | ICD-10-CM | POA: Diagnosis not present

## 2023-07-21 DIAGNOSIS — E1122 Type 2 diabetes mellitus with diabetic chronic kidney disease: Secondary | ICD-10-CM | POA: Diagnosis not present

## 2023-07-21 DIAGNOSIS — E1142 Type 2 diabetes mellitus with diabetic polyneuropathy: Secondary | ICD-10-CM | POA: Diagnosis not present

## 2023-07-27 DIAGNOSIS — I429 Cardiomyopathy, unspecified: Secondary | ICD-10-CM | POA: Diagnosis not present

## 2023-07-27 DIAGNOSIS — I5023 Acute on chronic systolic (congestive) heart failure: Secondary | ICD-10-CM | POA: Diagnosis not present

## 2023-07-27 DIAGNOSIS — I5021 Acute systolic (congestive) heart failure: Secondary | ICD-10-CM | POA: Diagnosis not present

## 2023-07-28 NOTE — Progress Notes (Signed)
 ADVANCED HEART FAILURE CLINIC NOTE  Referring Physician: Emilio Aspen, *  Primary Care: Emilio Aspen, MD Primary Cardiologist: N/A  CC: End stage heart failure, Stage D on home palliative inotropes  HPI: Phillip Rich is a 73 y.o. male with T2DM, HTN, renal mass s/p nephrectomy, COPD that presented to the [facility] ER w/ acute onset SOB on 08/27 when he was diagnosed with acute PE and HFrEF w/ an LVEF of 25%-30%; during admission, coronary angiography was significant for CTO of the RCA and diffusely diseased Lcx. RHC w/ elevated pre and post capillary filling pressures. He was diuresed and discharged home and started on some GDMT. Since that time, [name] has had a CMR demonstrating persistently reduced LV function (EF 20%) and CPX with severe cardiac limitations.   He was directly admitted for low output heart failure in late 024.  During that time right heart catheterization consistent with severely reduced cardiac index.  He was started on IV inotropes and underwent LVAD evaluation.  Unfortunately LVAD evaluation was placed on hold after CT chest demonstrated severe lung disease with severely reduced DLCO.  He was ultimately discharged home on IV milrinone.  Since initiation of milrinone he has had significant improvement in quality of life.   Interval hx:  - Currently on milrinone 0.89mcg/kg/min; tolerating it very well. Has not noticed any symptomatic changes since decreasing the dose.  - EP appt canceled today.   Activity level/exercise tolerance:  NYHA IIB-III on chronic IV inotropes Orthopnea:  Sleeps on 2 pillows Paroxysmal noctural dyspnea:  No Chest pain/pressure:  No Orthostatic lightheadedness:  No Palpitations:  No Lower extremity edema:  Minimal pretibial edema Presyncope/syncope:  No Cough:  No    Current Outpatient Medications  Medication Sig Dispense Refill   albuterol (VENTOLIN HFA) 108 (90 Base) MCG/ACT inhaler Inhale 1-2 puffs into the  lungs every 6 (six) hours as needed for wheezing or shortness of breath. 1 each 0   atorvastatin (LIPITOR) 80 MG tablet Take 1 tablet (80 mg total) by mouth daily. 90 tablet 3   Cholecalciferol (VITAMIN D PO) Take 1,000 Units by mouth every morning.     digoxin (LANOXIN) 0.125 MG tablet Take 1 tablet (125 mcg total) by mouth daily. 90 tablet 0   DULoxetine (CYMBALTA) 60 MG capsule Take 60 mg by mouth daily.     empagliflozin (JARDIANCE) 10 MG TABS tablet Take 1 tablet (10 mg total) by mouth daily before breakfast. 90 tablet 3   eplerenone (INSPRA) 25 MG tablet Take 0.5 tablets (12.5 mg total) by mouth daily. 45 tablet 3   fluticasone (FLONASE) 50 MCG/ACT nasal spray Place 1 spray into both nostrils 2 (two) times a week.     Fluticasone Furoate (ARNUITY ELLIPTA) 100 MCG/ACT AEPB Inhale 1 puff into the lungs daily. 30 each 11   levocetirizine (XYZAL) 5 MG tablet Take 5 mg by mouth daily.     Melatonin 10 MG TABS Take 10 mg by mouth at bedtime as needed (Sleep).     metFORMIN (GLUCOPHAGE) 1000 MG tablet Take 1,000 mg by mouth 2 (two) times daily.      omeprazole (PRILOSEC) 20 MG capsule Take 1 tablet by mouth every morning.      sacubitril-valsartan (ENTRESTO) 24-26 MG Take 1 tablet by mouth 2 (two) times daily. 1 tablet in the am and 1/2 tablet in the evening     Semaglutide,0.25 or 0.5MG /DOS, (OZEMPIC, 0.25 OR 0.5 MG/DOSE,) 2 MG/3ML SOPN Inject 0.5 mg into  the skin once a week. On Mondays     torsemide (DEMADEX) 20 MG tablet Take 2 tablets (40 mg total) by mouth daily. 60 tablet 3   vitamin B-12 (CYANOCOBALAMIN) 100 MCG tablet Take 100 mcg by mouth daily.     zolpidem (AMBIEN) 10 MG tablet Take 10 mg by mouth at bedtime as needed for sleep.     Magnesium Oxide -Mg Supplement 200 MG TABS Take 1 tablet (200 mg total) by mouth daily at 12 noon. 30 tablet 11   milrinone (PRIMACOR) 20 MG/100 ML SOLN infusion Inject 0.0098 mg/min into the vein continuous.     No current facility-administered  medications for this encounter.   Facility-Administered Medications Ordered in Other Encounters  Medication Dose Route Frequency Provider Last Rate Last Admin   alteplase (CATHFLO ACTIVASE) injection 2 mg  2 mg Intracatheter Once Nihira Puello, DO         PHYSICAL EXAM: Vitals:   07/30/23 1249  BP: 102/70  Pulse: (!) 101  SpO2: 96%   GENERAL: NAD Lungs- CTA CARDIAC:  JVP: 6 cm          Normal rate with regular rhythm. No murmur.  Pulses 2+. No edema.  ABDOMEN: Soft, non-tender, non-distended.  EXTREMITIES: Warm and well perfused.  NEUROLOGIC: No obvious FND     DATA REVIEW  ECG: 02/28/22: NSR  ECHO: TTE 01/10/22: LVEF 15%-20%, RV function moderately reduced. Moderate MR.  TTE 11/25/22: LVEF 30%,   CMR 04/21/22: 1. Moderate LV dilatation with severe systolic dysfunction (EF 20%). There is akinesis of basal inferior wall and thinning/akinesis of mid to apical anterior wall  2.  Normal RV size with mild systolic dysfunction (EF 40%) 3. Subendocardial late gadolinium enhancement consistent with prior infarct in basal inferior, mid to apical anterior, and apical lateral walls. LGE is less than 50% transmural in basal inferior wall, suggesting viability. LGE is greater than 50% transmural in mid to apical anterior and apical lateral walls, suggesting nonviability in this region.  4.  Moderate tricuspid regurgitation (regurgitant fraction 23%)  5. Mild mitral regurgitation (regurgitant fraction 10%)  CATH: 01/15/22   Ost LM lesion is 20% stenosed.   Prox Cx to Dist Cx lesion is 70% stenosed.   Prox LAD to Mid LAD lesion is 30% stenosed.   Mid RCA lesion is 100% stenosed.   Prox RCA lesion is 70% stenosed.   1.  Significant two-vessel coronary artery disease with chronically occluded mid right coronary artery with left to right as well as bridging collaterals.  The left circumflex is tortuous and diffusely diseased in the midsegment. 2.  Left ventricular angiography  was not performed.  EF was severely reduced by echo. 3.  Right heart catheterization showed significantly elevated right and left-sided filling pressures, moderate to severe pulmonary hypertension and moderately reduced cardiac output.  08/05/22: HEMODYNAMICS: RA:                  6 mmHg (mean) RV:                  27/4-6 mmHg PA:                  28/15 mmHg (21 mean) PCWP:            14 mmHg (mean)  Estimated Fick CO/CI   4.5 L/min, 2.2 L/min/m2 Thermodilution CO/CI   4.5 L/min, 2.2 L/min/m2                                              TPG                 7  mmHg                                              PVR                 1.5 Wood Units  PAPi                2.2    01/28/23:  RA mean 17 RV 57/21 PA 58/30, mean 39 PCWP mean 28  Oxygen saturations: PA 42% AO 95%  Cardiac Output (Fick) 3.37  Cardiac Index (Fick) 1.66   Cardiac Output (Fick) 3.20  Cardiac Index (Fick) 1.58  PVR 3.4 WU   CPX: 06/20/22: Mildly submax test but based on available data there appears to be a severe HF limitations with a blunted BP response to exercise and markedly elevated VeVCO2 slope. Consideration should be given to advanced HF therapies if clinically indicated.   ASSESSMENT & PLAN:  End-stage, Stage D, Heart failure with reduced EF, NYHA III Etiology of NW:GNFAO, ischemic and nonischemic components; now on home palliative inotropes. NYHA class / AHA Stage:IIB-III Volume status & Diuretics:  PRN only Vasodilators: currently taking Entresto 24/26mg  BID.  Beta-Blocker: currently on milrinone 0.337mcg/kg/min; we will reduce to 0.75mcg/kg/min today.  MRA: epleronone 12.5mg  daily (reports gynecomastia).  Cardiometabolic: continue jardiance 10mg  daily Devices therapies & Valvulopathies: CMR w/ LVEF 20%.  Patient has met Dr. Graciela Husbands for ICD evaluation.  In the interim he has had a very concerning CPX.  Repeat right heart cath with mildly reduced cardiac index.  At  this time patient not interested in pursuing advanced therapies or ICD.  Advanced therapies:  Mr. Cina had a CPX with a peak VO2 of 10.9 and severely elevated VE/VCO2 slope.  Now on milrinone 0.33mcg/kg/min. He is not interested in pursuing LVAD therapy and understands that he is high risk due to underlying lung disease. Recent DLCO less than 50% however not corrected for Hgb. Reducing milrinone 0.49mcg/kg/min.  06/01/22: We had a lengthy discussion today regarding CCM. He is very interested in pursuing therapy. There are small single center publications with use of CCM to help wean off milrinone. Planning to see Dr. Lalla Brothers next month to discuss CCM 07/03/23: Remains motivated to come off milrinone; today we will turn down milrinone to 0.22mcg/kg/min. Discussed risks/benefits extensively today.  3/25: Tolerated milrinone wean very well; will wean down to 0.136mcg/kg/min. I reached out to Dr. Graciela Husbands to rearrange expedited appointment for CCM placement. He is agreeable to discuss with Mr. Mcclenny. Appreciate his input.   2. Pulmonary embolism  - CT from 01/05/22 w/ acute PE involving lobar and segmental branches at the RLL, RML - Doing well; has been off [facility] now.   3. COPD - has not required home O2; stable today; does not report history of frequent exacerbations.  - PFTs with severely reduced DLCO - Will start pulmonary rehab; remains pending; reports that  he is unable to start due to PICC line placement. Will look into this.  - Follow up with Dr. Marchelle Gearing on 06/04/23. No significant dyspnea; doing well.   4. Tobacco abuse - He has now stopped smoking.  - Continued discussing cessation.   5. Chronic palliative inotropes - Decreasing to 0.142mcg/kg/min with plan to transition to CCM from this. Reviewed several single center studies that were able to successfully wean patients off low dose milrinone with CCM. Discussed with Dr. Graciela Husbands.   I spent 40 minutes caring for this patient today including  face to face time, ordering and reviewing labs, reviewing CCM with patient, contact EP for CCM placement, seeing the patient, documenting in the record, and arranging follow ups.    Karlisha Mathena Advanced Heart Failure Mechanical Circulatory Support

## 2023-07-29 ENCOUNTER — Telehealth (HOSPITAL_COMMUNITY): Payer: Self-pay | Admitting: Cardiology

## 2023-07-29 ENCOUNTER — Other Ambulatory Visit (HOSPITAL_COMMUNITY): Payer: Self-pay | Admitting: Cardiology

## 2023-07-30 ENCOUNTER — Ambulatory Visit (HOSPITAL_COMMUNITY)
Admission: RE | Admit: 2023-07-30 | Discharge: 2023-07-30 | Disposition: A | Discharge status: Home / Self Care | Payer: Medicare Other | Source: Ambulatory Visit | Attending: Cardiology | Admitting: Cardiology

## 2023-07-30 ENCOUNTER — Ambulatory Visit: Payer: Medicare Other | Admitting: Cardiology

## 2023-07-30 VITALS — BP 102/70 | HR 101 | Wt 173.0 lb

## 2023-07-30 DIAGNOSIS — I5084 End stage heart failure: Secondary | ICD-10-CM | POA: Insufficient documentation

## 2023-07-30 DIAGNOSIS — I5022 Chronic systolic (congestive) heart failure: Secondary | ICD-10-CM | POA: Diagnosis not present

## 2023-07-30 DIAGNOSIS — I2694 Multiple subsegmental pulmonary emboli without acute cor pulmonale: Secondary | ICD-10-CM | POA: Diagnosis not present

## 2023-07-30 DIAGNOSIS — Z7984 Long term (current) use of oral hypoglycemic drugs: Secondary | ICD-10-CM | POA: Insufficient documentation

## 2023-07-30 DIAGNOSIS — I5023 Acute on chronic systolic (congestive) heart failure: Secondary | ICD-10-CM | POA: Diagnosis not present

## 2023-07-30 DIAGNOSIS — J449 Chronic obstructive pulmonary disease, unspecified: Secondary | ICD-10-CM | POA: Diagnosis not present

## 2023-07-30 DIAGNOSIS — I2510 Atherosclerotic heart disease of native coronary artery without angina pectoris: Secondary | ICD-10-CM | POA: Diagnosis not present

## 2023-07-30 DIAGNOSIS — Z905 Acquired absence of kidney: Secondary | ICD-10-CM | POA: Diagnosis not present

## 2023-07-30 DIAGNOSIS — I429 Cardiomyopathy, unspecified: Secondary | ICD-10-CM | POA: Diagnosis not present

## 2023-07-30 DIAGNOSIS — Z87891 Personal history of nicotine dependence: Secondary | ICD-10-CM | POA: Insufficient documentation

## 2023-07-30 DIAGNOSIS — Z79899 Other long term (current) drug therapy: Secondary | ICD-10-CM | POA: Insufficient documentation

## 2023-07-30 DIAGNOSIS — E119 Type 2 diabetes mellitus without complications: Secondary | ICD-10-CM | POA: Insufficient documentation

## 2023-07-30 DIAGNOSIS — Z7985 Long-term (current) use of injectable non-insulin antidiabetic drugs: Secondary | ICD-10-CM | POA: Insufficient documentation

## 2023-07-30 DIAGNOSIS — N62 Hypertrophy of breast: Secondary | ICD-10-CM | POA: Diagnosis not present

## 2023-07-30 DIAGNOSIS — Z86711 Personal history of pulmonary embolism: Secondary | ICD-10-CM | POA: Diagnosis not present

## 2023-07-30 DIAGNOSIS — Z95828 Presence of other vascular implants and grafts: Secondary | ICD-10-CM | POA: Diagnosis not present

## 2023-07-30 DIAGNOSIS — I110 Hypertensive heart disease with heart failure: Secondary | ICD-10-CM | POA: Insufficient documentation

## 2023-07-30 LAB — BASIC METABOLIC PANEL
Anion gap: 9 (ref 5–15)
BUN: 27 mg/dL — ABNORMAL HIGH (ref 8–23)
CO2: 23 mmol/L (ref 22–32)
Calcium: 8.8 mg/dL — ABNORMAL LOW (ref 8.9–10.3)
Chloride: 103 mmol/L (ref 98–111)
Creatinine, Ser: 1.27 mg/dL — ABNORMAL HIGH (ref 0.61–1.24)
GFR, Estimated: 60 mL/min — ABNORMAL LOW (ref >60)
Glucose, Bld: 166 mg/dL — ABNORMAL HIGH (ref 70–99)
Potassium: 4 mmol/L (ref 3.5–5.1)
Sodium: 135 mmol/L (ref 135–145)

## 2023-07-30 LAB — BRAIN NATRIURETIC PEPTIDE: B Natriuretic Peptide: 502.7 pg/mL — ABNORMAL HIGH (ref 0.0–100.0)

## 2023-07-30 MED ORDER — MILRINONE LACTATE IN DEXTROSE 20-5 MG/100ML-% IV SOLN: 0.1250 ug/kg/min | INTRAVENOUS | Status: DC

## 2023-07-30 NOTE — Progress Notes (Deleted)
  Electrophysiology Office Note:    Date:  07/30/2023   ID:  Phillip, [name] [birth year], MRN [id number]  [facility] Cardiologist:  Rollene Rotunda, MD  [facility] Electrophysiologist:  Lanier Prude, MD   Referring MD: Dorthula Nettles, DO   Chief Complaint: Chronic systolic heart failure  History of Present Illness:    [name] is a 73 year old man who I am seeing today for an evaluation of chronic systolic heart failure at the request of Dr. Gasper Lloyd.  The patient has a history of diabetes, hypertension, nephrectomy, COPD and chronic systolic heart failure.  He has severe coronary artery disease that is the culprit for his systolic heart failure.  He has seen Dr. Graciela Husbands for ICD evaluation in February 2024.  Unclear exactly what the conversation was about ICD at that appointment but final decision was ultimately deferred..  There is discussion in the chart about possible CTO revascularization.  He is being referred specifically for evaluation for CCM therapy.  He currently does not have an ICD.     Their past medical, social and family history was reviewed.   ROS:   Please see the history of present illness.    All other systems reviewed and are negative.  EKGs/Labs/Other Studies Reviewed:    The following studies were reviewed today:         Physical Exam:    VS:  There were no vitals taken for this visit.    Wt Readings from Last 3 Encounters:  07/03/23 173 lb 6.4 oz (78.7 kg)  06/04/23 175 lb (79.4 kg)  06/02/23 163 lb (73.9 kg)     GEN: no distress CARD: RRR, No MRG RESP: No IWOB. CTAB.        ASSESSMENT AND PLAN:    No diagnosis found.  #Chronic systolic heart failure EF 20 to 25%.  NYHA class IV.  On home milrinone.  Could be mixed etiology although he has severe, unrevascularized coronary artery disease. I been asked to weigh in about CCM therapy.  I am concerned about the patient's advanced heart failure symptoms.  I have  discussed the CCM implant procedure in detail.  I discussed ICD therapy during today's clinic appointment.  He is obviously at very high risk for sudden cardiac death related to his chronic systolic heart failure given the persistently reduced ejection fraction and scar on cardiac MRI.  ***CTO revascularization?  ***Dr. Graciela Husbands follow-up?      Signed, Rossie Muskrat. Lalla Brothers, MD, [facility], [facility] 07/30/2023 5:26 AM    Electrophysiology [facility]

## 2023-07-30 NOTE — Patient Instructions (Addendum)
 Medication Changes:  WE WILL SEND A MESSAGE TO HOME HEALTH TO DECREASE MILRINONE DOSE TO 0.125MCG/KG/MIN  Lab Work:  Labs done today, your results will be available in MyChart, we will contact you for abnormal readings.  Follow-Up in: 6 WEEKS AS SCHEDULED   At the [facility], you and your health needs are our priority. We have a designated team specialized in the treatment of Heart Failure. This Care Team includes your primary Heart Failure Specialized Cardiologist (physician), Advanced Practice Providers (APPs- Physician Assistants and Nurse Practitioners), and Pharmacist who all work together to provide you with the care you need, when you need it.   You may see any of the following providers on your designated Care Team at your next follow up:  Dr. Arvilla Meres Dr. Marca Ancona Dr. Dorthula Nettles Dr. Theresia Bough Tonye Becket, NP Robbie Lis, [place] [facility] [place], [place] Brynda Peon, NP [place] Lee, NP Karle Plumber, PharmD   Please be sure to bring in all your medications bottles to every appointment.   Need to Contact us:  If you have any questions or concerns before your next appointment please send [place] a message through [place] or call our office at [phone].    TO LEAVE A MESSAGE FOR THE NURSE SELECT OPTION 2, PLEASE LEAVE A MESSAGE INCLUDING: YOUR NAME DATE OF BIRTH CALL BACK NUMBER REASON FOR CALL**this is important as we prioritize the call backs  YOU WILL RECEIVE A CALL BACK THE SAME DAY AS LONG AS YOU CALL BEFORE 4:00 PM

## 2023-08-03 DIAGNOSIS — I429 Cardiomyopathy, unspecified: Secondary | ICD-10-CM | POA: Diagnosis not present

## 2023-08-03 DIAGNOSIS — I5023 Acute on chronic systolic (congestive) heart failure: Secondary | ICD-10-CM | POA: Diagnosis not present

## 2023-08-05 DIAGNOSIS — K7581 Nonalcoholic steatohepatitis (NASH): Secondary | ICD-10-CM | POA: Diagnosis not present

## 2023-08-05 DIAGNOSIS — I5022 Chronic systolic (congestive) heart failure: Secondary | ICD-10-CM | POA: Diagnosis not present

## 2023-08-05 DIAGNOSIS — N6342 Unspecified lump in left breast, subareolar: Secondary | ICD-10-CM | POA: Diagnosis not present

## 2023-08-05 DIAGNOSIS — J8410 Pulmonary fibrosis, unspecified: Secondary | ICD-10-CM | POA: Diagnosis not present

## 2023-08-05 DIAGNOSIS — N182 Chronic kidney disease, stage 2 (mild): Secondary | ICD-10-CM | POA: Diagnosis not present

## 2023-08-05 DIAGNOSIS — E1142 Type 2 diabetes mellitus with diabetic polyneuropathy: Secondary | ICD-10-CM | POA: Diagnosis not present

## 2023-08-05 DIAGNOSIS — I10 Essential (primary) hypertension: Secondary | ICD-10-CM | POA: Diagnosis not present

## 2023-08-05 DIAGNOSIS — E1122 Type 2 diabetes mellitus with diabetic chronic kidney disease: Secondary | ICD-10-CM | POA: Diagnosis not present

## 2023-08-06 ENCOUNTER — Other Ambulatory Visit: Payer: Self-pay | Admitting: Internal Medicine

## 2023-08-06 DIAGNOSIS — N6342 Unspecified lump in left breast, subareolar: Secondary | ICD-10-CM

## 2023-08-10 ENCOUNTER — Other Ambulatory Visit: Payer: Medicare Other

## 2023-08-10 DIAGNOSIS — I5022 Chronic systolic (congestive) heart failure: Secondary | ICD-10-CM | POA: Diagnosis not present

## 2023-08-10 DIAGNOSIS — I429 Cardiomyopathy, unspecified: Secondary | ICD-10-CM | POA: Diagnosis not present

## 2023-08-10 DIAGNOSIS — I5023 Acute on chronic systolic (congestive) heart failure: Secondary | ICD-10-CM | POA: Diagnosis not present

## 2023-08-17 ENCOUNTER — Telehealth (HOSPITAL_COMMUNITY): Payer: Self-pay | Admitting: Cardiology

## 2023-08-17 DIAGNOSIS — I5023 Acute on chronic systolic (congestive) heart failure: Secondary | ICD-10-CM | POA: Diagnosis not present

## 2023-08-17 DIAGNOSIS — I429 Cardiomyopathy, unspecified: Secondary | ICD-10-CM | POA: Diagnosis not present

## 2023-08-17 NOTE — Telephone Encounter (Signed)
 Cindy with HH called to report pts c/o increased fatigue and decreased energy since decreasing milrinone dosage.  Please advise further if additional changes are needed otherwise message sent as FYI      I called and left a message to call back regarding symptoms. Asked to return call.  Amy Clegg NP-C  /11:20 AM

## 2023-08-18 NOTE — Telephone Encounter (Signed)
 Patient reports increase in fatigue Reports all he wants to do is sleep Reports increase in fatigue started after decrease in milrinone   Denies SOB, swelling,weight gain, or CP Denies dizziness

## 2023-08-19 ENCOUNTER — Ambulatory Visit
Admission: RE | Admit: 2023-08-19 | Discharge: 2023-08-19 | Disposition: A | Discharge status: Home / Self Care | Source: Ambulatory Visit | Attending: Internal Medicine | Admitting: Internal Medicine

## 2023-08-19 DIAGNOSIS — J849 Interstitial pulmonary disease, unspecified: Secondary | ICD-10-CM

## 2023-08-19 DIAGNOSIS — I700 Atherosclerosis of aorta: Secondary | ICD-10-CM | POA: Diagnosis not present

## 2023-08-19 DIAGNOSIS — J439 Emphysema, unspecified: Secondary | ICD-10-CM | POA: Diagnosis not present

## 2023-08-19 DIAGNOSIS — R918 Other nonspecific abnormal finding of lung field: Secondary | ICD-10-CM | POA: Diagnosis not present

## 2023-08-19 DIAGNOSIS — Z86711 Personal history of pulmonary embolism: Secondary | ICD-10-CM

## 2023-08-19 DIAGNOSIS — J8410 Pulmonary fibrosis, unspecified: Secondary | ICD-10-CM | POA: Diagnosis not present

## 2023-08-19 DIAGNOSIS — R053 Chronic cough: Secondary | ICD-10-CM

## 2023-08-20 NOTE — Telephone Encounter (Signed)
 Please set up for a visit as soon as we can. Thanks  Ilya Neely NP-C  12:58 PM

## 2023-08-24 DIAGNOSIS — I429 Cardiomyopathy, unspecified: Secondary | ICD-10-CM | POA: Diagnosis not present

## 2023-08-24 DIAGNOSIS — I5023 Acute on chronic systolic (congestive) heart failure: Secondary | ICD-10-CM | POA: Diagnosis not present

## 2023-08-26 DIAGNOSIS — J8410 Pulmonary fibrosis, unspecified: Secondary | ICD-10-CM | POA: Diagnosis not present

## 2023-08-26 DIAGNOSIS — Z0000 Encounter for general adult medical examination without abnormal findings: Secondary | ICD-10-CM | POA: Diagnosis not present

## 2023-08-26 DIAGNOSIS — Z86718 Personal history of other venous thrombosis and embolism: Secondary | ICD-10-CM | POA: Diagnosis not present

## 2023-08-26 DIAGNOSIS — F17210 Nicotine dependence, cigarettes, uncomplicated: Secondary | ICD-10-CM | POA: Diagnosis not present

## 2023-08-26 DIAGNOSIS — N182 Chronic kidney disease, stage 2 (mild): Secondary | ICD-10-CM | POA: Diagnosis not present

## 2023-08-26 DIAGNOSIS — E1140 Type 2 diabetes mellitus with diabetic neuropathy, unspecified: Secondary | ICD-10-CM | POA: Diagnosis not present

## 2023-08-26 DIAGNOSIS — I5022 Chronic systolic (congestive) heart failure: Secondary | ICD-10-CM | POA: Diagnosis not present

## 2023-08-26 DIAGNOSIS — E1142 Type 2 diabetes mellitus with diabetic polyneuropathy: Secondary | ICD-10-CM | POA: Diagnosis not present

## 2023-08-26 DIAGNOSIS — I10 Essential (primary) hypertension: Secondary | ICD-10-CM | POA: Diagnosis not present

## 2023-08-26 DIAGNOSIS — E1122 Type 2 diabetes mellitus with diabetic chronic kidney disease: Secondary | ICD-10-CM | POA: Diagnosis not present

## 2023-08-26 DIAGNOSIS — K7581 Nonalcoholic steatohepatitis (NASH): Secondary | ICD-10-CM | POA: Diagnosis not present

## 2023-08-26 DIAGNOSIS — E7800 Pure hypercholesterolemia, unspecified: Secondary | ICD-10-CM | POA: Diagnosis not present

## 2023-08-26 DIAGNOSIS — Z1389 Encounter for screening for other disorder: Secondary | ICD-10-CM | POA: Diagnosis not present

## 2023-08-28 ENCOUNTER — Encounter: Payer: Self-pay | Admitting: Student

## 2023-08-28 ENCOUNTER — Ambulatory Visit: Attending: Cardiology | Admitting: Student

## 2023-08-28 VITALS — BP 110/66 | HR 93 | Ht 69.0 in | Wt 169.5 lb

## 2023-08-28 DIAGNOSIS — I2510 Atherosclerotic heart disease of native coronary artery without angina pectoris: Secondary | ICD-10-CM | POA: Diagnosis not present

## 2023-08-28 DIAGNOSIS — I5022 Chronic systolic (congestive) heart failure: Secondary | ICD-10-CM

## 2023-08-28 NOTE — Patient Instructions (Signed)
 Medication Instructions:  Your physician recommends that you continue on your current medications as directed. Please refer to the Current Medication list given to you today.  *If you need a refill on your cardiac medications before your next appointment, please call your pharmacy*  Lab Work: ProBNP-TODAY If you have labs (blood work) drawn today and your tests are completely normal, you will receive your results only by: MyChart Message (if you have MyChart) OR A paper copy in the mail If you have any lab test that is abnormal or we need to change your treatment, we will call you to review the results.  Follow-Up: At [facility], you and your health needs are our priority.  As part of our continuing mission to provide you with exceptional heart care, our providers are all part of one team.  This team includes your primary Cardiologist (physician) and Advanced Practice Providers or APPs (Physician Assistants and Nurse Practitioners) who all work together to provide you with the care you need, when you need it.  Your next appointment:   3 month(s)  Provider:   Ozell Jodie Passey, PA-C    We recommend signing up for the patient portal called MyChart.  Sign up information is provided on this After Visit Summary.  MyChart is used to connect with patients for Virtual Visits (Telemedicine).  Patients are able to view lab/test results, encounter notes, upcoming appointments, etc.  Non-urgent messages can be sent to your provider as well.   To learn more about what you can do with MyChart, go to [email or web address].      1st Floor: - Lobby - Registration  - Pharmacy  - Lab - Cafe  2nd Floor: - PV Lab - Diagnostic Testing (echo, CT, nuclear med)  3rd Floor: - Vacant  4th Floor: - TCTS (cardiothoracic surgery) - AFib Clinic - Structural Heart Clinic - Vascular Surgery  - Vascular Ultrasound  5th Floor: - HeartCare Cardiology (general and EP) - Clinical  Pharmacy for coumadin, hypertension, lipid, weight-loss medications, and med management appointments    Valet parking services will be available as well.

## 2023-08-28 NOTE — Progress Notes (Signed)
 Electrophysiology Office Note:    Date:  08/28/2023   ID:  Phillip Rich, DOB 1951-05-12, MRN [id number]  [facility] Cardiologist:  Eilleen Grates, MD  [facility] Electrophysiologist:  Boyce Byes, MD   Referring MD: Alwin Baars, DO   Chief Complaint: Chronic systolic heart failure  History of Present Illness:    Phillip Rich is a 73 year old man who we are seeing today for an evaluation of chronic systolic heart failure at the request of Dr. Bruce Caper.  The patient has a history of diabetes, hypertension, nephrectomy, COPD and chronic systolic heart failure.  He has severe coronary artery disease that is the culprit for his systolic heart failure.  He has seen Dr. Rodolfo Clan for ICD evaluation in February 2024.  Unclear exactly what the conversation was about ICD at that appointment but final decision was ultimately deferred.  There is discussion in the chart about possible CTO revascularization.  He is being referred specifically for evaluation for CCM therapy.  He currently does not have an ICD.  Pt is feeling OK today. Is anxious about the discussion. He has watched interviews and read about CCM at length. He is very hopeful about the possible benefit. He is mostly limited by fatigue, which he has noted more of since a down-titration of his milrinone .  He has SOB and fatigue limitations at least once a day.  He is otherwise limited physically from a history of arthritis with orthopedic surgery. He understands that his HF is advanced and that his options are limited, but states he was more optimistic prior to our discussion today of possible limitations.   Their past medical, social and family history was reviewed.  ROS:   Please see the history of present illness.    All other systems reviewed and are negative.      Studies reviewed   CMR 04/21/22: 1. Moderate LV dilatation with severe systolic dysfunction (EF 20%). There is akinesis of basal inferior wall  and thinning/akinesis of mid to apical anterior wall  2.  Normal RV size with mild systolic dysfunction (EF 40%) 3. Subendocardial late gadolinium enhancement consistent with prior infarct in basal inferior, mid to apical anterior, and apical lateral walls. LGE is less than 50% transmural in basal inferior wall, suggesting viability. LGE is greater than 50% transmural in mid to apical anterior and apical lateral walls, suggesting nonviability in this region.  4.  Moderate tricuspid regurgitation (regurgitant fraction 23%)  5. Mild mitral regurgitation (regurgitant fraction 10%)   RHC 08/05/22: HEMODYNAMICS: RA:                  6 mmHg (mean) RV:                  27/4-6 mmHg PA:                  28/15 mmHg (21 mean) PCWP:            14 mmHg (mean)                                      Estimated Fick CO/CI   4.5 L/min, 2.2 L/min/m2 Thermodilution CO/CI   4.5 L/min, 2.2 L/min/m2  TPG                 7  mmHg                                              PVR                 1.5 Wood Units  PAPi                2.2     RHC 01/28/23:  RA mean 17 RV 57/21 PA 58/30, mean 39 PCWP mean 28 Oxygen saturations: PA 42% AO 95% Cardiac Output (Fick) 3.37  Cardiac Index (Fick) 1.66  Cardiac Output (Fick) 3.20  Cardiac Index (Fick) 1.58 PVR 3.4 WU     CPX: 06/20/22: Mildly submax test but based on available data there appears to be a severe HF limitations with a blunted BP response to exercise and markedly elevated VeVCO2 slope. Consideration should be given to advanced HF therapies if clinically indicated.   Physical Exam:    VS:  BP 110/66   Pulse 93   Ht 5\' 9"  (1.753 m)   Wt 169 lb 8 oz (76.9 kg)   SpO2 95%   BMI 25.03 kg/m     Wt Readings from Last 3 Encounters:  08/28/23 169 lb 8 oz (76.9 kg)  07/30/23 173 lb (78.5 kg)  07/03/23 173 lb 6.4 oz (78.7 kg)   GEN: Well nourished, well developed in no acute distress NECK: No JVD; No carotid  bruits CARDIAC: Regular rate and rhythm, no murmurs, rubs, gallops RESPIRATORY:  Clear to auscultation without rales, wheezing or rhonchi  ABDOMEN: Soft, non-tender, non-distended EXTREMITIES:  No edema; No deformity       ASSESSMENT AND PLAN:    Chronic systolic heart failure, Stage D EF 20 to 25% by Echo 01/2023. NYHA class III symptoms on home milrinone  since 01/2023. Has had increased fatigue since down-titration of his  Could be mixed etiology although he has severe, unrevascularized coronary artery disease. We have been asked to weigh in about CCM therapy.  There is concern about the patient's advanced heart failure symptoms. Pt is very surprised to hear that this is a newer device indication, with only a handful implanted locally. Re-assurance given that the PROCEDURE is the same as a standard pacemaker or defibrillator, but the device itself is still relatively novel.   We also discussed ICD therapy during today's clinic appointment.  He is obviously at very high risk for sudden cardiac death related to his chronic systolic heart failure given the persistently reduced ejection fraction and scar on cardiac MRI. This discussion will be further deferred for now. He is most interested in device options that are likely to improve his symptoms.   The pts case will be discussed by the EP MDs as a group next week whether it is felt appropriate to proceed in this case, which would likely be for the purpose of palliation.    CAD The question of CTO revascularization has also been asked. Can revisit pending EP discussion.  We reviewed CCM and BAT at length today. He understands that the question of his candidacy is not a light or simple question given his co-morbidities and the advanced stage of his heart failure, and any potential (but not definite) benefits, need to be weighed against the risks.  [name] [address] [place], PA-C  08/28/2023 12:52 PM

## 2023-08-29 LAB — PRO B NATRIURETIC PEPTIDE: NT-Pro BNP: 3315 pg/mL — ABNORMAL HIGH (ref 0–376)

## 2023-08-31 DIAGNOSIS — I429 Cardiomyopathy, unspecified: Secondary | ICD-10-CM | POA: Diagnosis not present

## 2023-08-31 DIAGNOSIS — I5023 Acute on chronic systolic (congestive) heart failure: Secondary | ICD-10-CM | POA: Diagnosis not present

## 2023-09-02 ENCOUNTER — Ambulatory Visit (HOSPITAL_COMMUNITY)
Admission: RE | Admit: 2023-09-02 | Discharge: 2023-09-02 | Disposition: A | Discharge status: Home / Self Care | Source: Ambulatory Visit | Attending: Adult Health | Admitting: Adult Health

## 2023-09-02 ENCOUNTER — Encounter (HOSPITAL_COMMUNITY): Payer: Self-pay

## 2023-09-02 VITALS — BP 110/74 | HR 101 | Ht 69.5 in | Wt 167.6 lb

## 2023-09-02 DIAGNOSIS — Z86711 Personal history of pulmonary embolism: Secondary | ICD-10-CM | POA: Diagnosis not present

## 2023-09-02 DIAGNOSIS — I5022 Chronic systolic (congestive) heart failure: Secondary | ICD-10-CM | POA: Insufficient documentation

## 2023-09-02 DIAGNOSIS — I2510 Atherosclerotic heart disease of native coronary artery without angina pectoris: Secondary | ICD-10-CM | POA: Diagnosis not present

## 2023-09-02 DIAGNOSIS — E119 Type 2 diabetes mellitus without complications: Secondary | ICD-10-CM | POA: Insufficient documentation

## 2023-09-02 DIAGNOSIS — Z716 Tobacco abuse counseling: Secondary | ICD-10-CM | POA: Diagnosis not present

## 2023-09-02 DIAGNOSIS — I5084 End stage heart failure: Secondary | ICD-10-CM | POA: Insufficient documentation

## 2023-09-02 DIAGNOSIS — I110 Hypertensive heart disease with heart failure: Secondary | ICD-10-CM | POA: Insufficient documentation

## 2023-09-02 DIAGNOSIS — Z79899 Other long term (current) drug therapy: Secondary | ICD-10-CM | POA: Insufficient documentation

## 2023-09-02 DIAGNOSIS — N62 Hypertrophy of breast: Secondary | ICD-10-CM | POA: Diagnosis not present

## 2023-09-02 DIAGNOSIS — J449 Chronic obstructive pulmonary disease, unspecified: Secondary | ICD-10-CM | POA: Diagnosis not present

## 2023-09-02 DIAGNOSIS — Z7985 Long-term (current) use of injectable non-insulin antidiabetic drugs: Secondary | ICD-10-CM | POA: Insufficient documentation

## 2023-09-02 DIAGNOSIS — Z7984 Long term (current) use of oral hypoglycemic drugs: Secondary | ICD-10-CM | POA: Diagnosis not present

## 2023-09-02 DIAGNOSIS — Z87891 Personal history of nicotine dependence: Secondary | ICD-10-CM | POA: Insufficient documentation

## 2023-09-02 LAB — COOXEMETRY PANEL
Carboxyhemoglobin: 2.9 % — ABNORMAL HIGH (ref 0.5–1.5)
Methemoglobin: <0.7 % (ref 0.0–1.5)
O2 Saturation: 54.6 %
Total hemoglobin: 10.4 g/dL — ABNORMAL LOW (ref 12.0–16.0)

## 2023-09-02 NOTE — Progress Notes (Signed)
 ADVANCED HEART FAILURE CLINIC NOTE  Referring Physician: Benedetta Bradley, *  Primary Care: Benedetta Bradley, MD Primary Cardiologist: N/A  CC: End stage heart failure, Stage D on home palliative inotropes  HPI: Phillip Rich is a 73 y.o. male with T2DM, HTN, renal mass s/p nephrectomy, COPD that presented to [facility] ER w/ acute onset SOB on 08/27 when he was diagnosed with acute PE and HFrEF w/ an LVEF of 25%-30%; during admission, coronary angiography was significant for CTO of the RCA and diffusely diseased Lcx. RHC w/ elevated pre and post capillary filling pressures. He was diuresed and discharged home and started on some GDMT. Since that time, [name] has had a CMR demonstrating persistently reduced LV function (EF 20%) and CPX with severe cardiac limitations.   He was directly admitted for low output heart failure in late 024.  During that time right heart catheterization consistent with severely reduced cardiac index.  He was started on IV inotropes and underwent LVAD evaluation.  Unfortunately LVAD evaluation was placed on hold after CT chest demonstrated severe lung disease with severely reduced DLCO.  He was ultimately discharged home on IV milrinone .  Since initiation of milrinone  he has had significant improvement in quality of life.   Interval hx:  - March 20 th Milrinone  cut back milrinone  0.128mcg/kg/min  -08/28/23 Saw EP for possible CCM or BAT.   Today he returns for HF follow up. Complaining of fatigue. He noticed more fatigue after milrinone  was cut back 0.125 mcg mcg. Usually tired in the morning but feels better as the days goes on.  He sits around his house or stays in bed. Having some SOB with exertion. Denies PND/Orthopnea. Appetite ok. No fever or chills. Weight at home has been stable.  Taking all medications. He is not interested in LVAD. Lives alone.    Current Outpatient Medications  Medication Sig Dispense Refill   albuterol  (VENTOLIN   HFA) 108 (90 Base) MCG/ACT inhaler Inhale 1-2 puffs into the lungs every 6 (six) hours as needed for wheezing or shortness of breath. 1 each 0   atorvastatin  (LIPITOR ) 80 MG tablet Take 1 tablet (80 mg total) by mouth daily. 90 tablet 3   Blood Glucose Monitoring Suppl (ONETOUCH VERIO FLEX SYSTEM) w/Device KIT Use to check blood sugar for 365 days twice a day DX: E11.65     Cholecalciferol  (VITAMIN D  PO) Take 1,000 Units by mouth every morning.     digoxin  (LANOXIN ) 0.125 MG tablet Take 1 tablet (125 mcg total) by mouth daily. 90 tablet 0   DULoxetine  (CYMBALTA ) 60 MG capsule Take 60 mg by mouth daily.     empagliflozin  (JARDIANCE ) 10 MG TABS tablet Take 1 tablet (10 mg total) by mouth daily before breakfast. 90 tablet 3   eplerenone  (INSPRA ) 25 MG tablet Take 0.5 tablets (12.5 mg total) by mouth daily. 45 tablet 3   fluticasone  (FLONASE ) 50 MCG/ACT nasal spray Place 1 spray into both nostrils 2 (two) times a week.     Fluticasone  Furoate (ARNUITY ELLIPTA ) 100 MCG/ACT AEPB Inhale 1 puff into the lungs daily. 30 each 11   Lancets (ONETOUCH DELICA PLUS LANCET33G) MISC Apply topically 2 (two) times daily.     levocetirizine (XYZAL) 5 MG tablet Take 5 mg by mouth daily.     Magnesium  Oxide -Mg Supplement 200 MG TABS Take 1 tablet (200 mg total) by mouth daily at 12 noon. 30 tablet 11   Melatonin 10 MG TABS Take 10  mg by mouth at bedtime as needed (Sleep).     metFORMIN (GLUCOPHAGE) 1000 MG tablet Take 1,000 mg by mouth 2 (two) times daily.      milrinone  (PRIMACOR ) 20 MG/100 ML SOLN infusion Inject 0.0098 mg/min into the vein continuous.     omeprazole (PRILOSEC) 20 MG capsule Take 1 tablet by mouth every morning.      ONETOUCH VERIO test strip Check blood sugar In Vitro twice a day DX: E11.65 for 100 days     sacubitril -valsartan  (ENTRESTO ) 24-26 MG Take 1 tablet by mouth 2 (two) times daily. 1 tablet in the am and 1/2 tablet in the evening     Semaglutide,0.25 or 0.5MG /DOS, (OZEMPIC, 0.25 OR 0.5  MG/DOSE,) 2 MG/3ML SOPN Inject 0.5 mg into the skin once a week. On Mondays     torsemide  (DEMADEX ) 20 MG tablet Take 2 tablets (40 mg total) by mouth daily. 60 tablet 3   vitamin B-12 (CYANOCOBALAMIN) 100 MCG tablet Take 100 mcg by mouth daily.     zolpidem  (AMBIEN ) 10 MG tablet Take 10 mg by mouth at bedtime as needed for sleep.     No current facility-administered medications for this encounter.   Facility-Administered Medications Ordered in Other Encounters  Medication Dose Route Frequency Provider Last Rate Last Admin   alteplase  (CATHFLO ACTIVASE ) injection 2 mg  2 mg Intracatheter Once Sabharwal, [name], DO         PHYSICAL EXAM: Vitals:   09/02/23 1052  BP: 110/74  Pulse: (!) 101  SpO2: 95%   General:   No resp difficulty Neck: supple. no JVD.  Cor: PMI nondisplaced. Regular rate & rhythm. No rubs, gallops or murmurs. R upper chest tunneled PICC site ok.  Lungs: clear Abdomen: soft, nontender, nondistended.  Extremities: no cyanosis, clubbing, rash, edema Neuro: alert & oriented x3  DATA REVIEW  ECG: 02/28/22: NSR  ECHO: TTE 01/10/22: LVEF 15%-20%, RV function moderately reduced. Moderate MR.  TTE 11/25/22: LVEF 30%,   CMR 04/21/22: 1. Moderate LV dilatation with severe systolic dysfunction (EF 20%). There is akinesis of basal inferior wall and thinning/akinesis of mid to apical anterior wall  2.  Normal RV size with mild systolic dysfunction (EF 40%) 3. Subendocardial late gadolinium enhancement consistent with prior infarct in basal inferior, mid to apical anterior, and apical lateral walls. LGE is less than 50% transmural in basal inferior wall, suggesting viability. LGE is greater than 50% transmural in mid to apical anterior and apical lateral walls, suggesting nonviability in this region.  4.  Moderate tricuspid regurgitation (regurgitant fraction 23%)  5. Mild mitral regurgitation (regurgitant fraction 10%)  CATH: 01/15/22   Ost LM lesion is 20%  stenosed.   Prox Cx to Dist Cx lesion is 70% stenosed.   Prox LAD to Mid LAD lesion is 30% stenosed.   Mid RCA lesion is 100% stenosed.   Prox RCA lesion is 70% stenosed.   1.  Significant two-vessel coronary artery disease with chronically occluded mid right coronary artery with left to right as well as bridging collaterals.  The left circumflex is tortuous and diffusely diseased in the midsegment. 2.  Left ventricular angiography was not performed.  EF was severely reduced by echo. 3.  Right heart catheterization showed significantly elevated right and left-sided filling pressures, moderate to severe pulmonary hypertension and moderately reduced cardiac output.  08/05/22: HEMODYNAMICS: RA:                  6 mmHg (mean) RV:  27/4-6 mmHg PA:                  28/15 mmHg (21 mean) PCWP:            14 mmHg (mean)                                      Estimated Fick CO/CI   4.5 L/min, 2.2 L/min/m2 Thermodilution CO/CI   4.5 L/min, 2.2 L/min/m2                                              TPG                 7  mmHg                                              PVR                 1.5 Wood Units  PAPi                2.2    01/28/23:  RA mean 17 RV 57/21 PA 58/30, mean 39 PCWP mean 28  Oxygen saturations: PA 42% AO 95%  Cardiac Output (Fick) 3.37  Cardiac Index (Fick) 1.66   Cardiac Output (Fick) 3.20  Cardiac Index (Fick) 1.58  PVR 3.4 WU   CPX: 06/20/22: Mildly submax test but based on available data there appears to be a severe HF limitations with a blunted BP response to exercise and markedly elevated VeVCO2 slope. Consideration should be given to advanced HF therapies if clinically indicated.   ASSESSMENT & PLAN:  End-stage, Stage D, Heart failure with reduced EF, NYHA III Etiology of ZO:XWRUE, ischemic and nonischemic components; now on home palliative inotropes. NYHA class / AHA Stage: III D on Palliative milrinone  0.125 mcg. Check CO-OX . Volume status  & Diuretics:  Appears euvolemic. Continue torsemide  40 mg daily.  Vasodilators: currently taking Entresto  24/26mg  BID.  Beta-Blocker: No on milrinone .  MRA: Continue eplerenone  12.5mg  daily (reports gynecomastia).  Cardiometabolic: continue jardiance  10mg  daily Devices therapies & Valvulopathies: CMR w/ LVEF 20%.  Patient has met Dr. Rodolfo Clan for ICD evaluation.  In the interim he has had a very concerning CPX.  Repeat right heart cath with mildly reduced cardiac index.  At this time patient not interested in pursuing advanced therapies or ICD.  Advanced therapies:  Mr. Seawood had a CPX with a peak VO2 of 10.9 and severely elevated VE/VCO2 slope.  He does not want to pursue LVAD.  Dr Bruce Caper has been weaning Milrinone  in an effort to hopefully try CCM or BAT. He has been seen by EP.   2. Pulmonary embolism  - CT from 01/05/22 w/ acute PE involving lobar and segmental branches at the RLL, RML - Doing well; has been off [facility] now.   3. COPD - has not required home O2; stable today; does not report history of frequent exacerbations.  - PFTs with severely reduced DLCO - Unable to start  pulmonary rehab due to inotropes.  - Follow up with Dr. Bertrum Brodie on 06/04/23. No significant dyspnea; doing well.   4. Tobacco  abuse - He has now stopped smoking.  - Continued discussing cessation.   5. Chronic palliative inotropes - He has been on palliative Milrinone    Follow up with Dr Bruce Caper next month. EP evaluating for eligibility for CCM versus BAT.  Traylen Eckels NP-C  11:08 AM

## 2023-09-02 NOTE — Patient Instructions (Signed)
 We will call you about lab that was drawn   Follow-Up in: as scheduled with Dr. Bruce Caper   At the [facility], you and your health needs are our priority. We have a designated team specialized in the treatment of Heart Failure. This Care Team includes your primary Heart Failure Specialized Cardiologist (physician), Advanced Practice Providers (APPs- Physician Assistants and Nurse Practitioners), and Pharmacist who all work together to provide you with the care you need, when you need it.   You may see any of the following providers on your designated Care Team at your next follow up:  Dr. Jules Oar Dr. Peder Bourdon Dr. Alwin Baars Dr. Judyth Nunnery Nieves Bars, NP Ruddy Corral, [place] [facility] [place], [place] Dennise Fitz, NP [place] Lee, NP Luster Salters, PharmD   Please be sure to bring in all your medications bottles to every appointment.   Need to Contact Us :  If you have any questions or concerns before your next appointment please send us  a message through [place] or call our office at [phone].    TO LEAVE A MESSAGE FOR THE NURSE SELECT OPTION 2, PLEASE LEAVE A MESSAGE INCLUDING: YOUR NAME DATE OF BIRTH CALL BACK NUMBER REASON FOR CALL**this is important as we prioritize the call backs  YOU WILL RECEIVE A CALL BACK THE SAME DAY AS LONG AS YOU CALL BEFORE 4:00 PM

## 2023-09-04 ENCOUNTER — Ambulatory Visit: Payer: Self-pay

## 2023-09-04 NOTE — Telephone Encounter (Signed)
 FYI

## 2023-09-04 NOTE — Telephone Encounter (Signed)
 Copied from CRM [id number]. Topic: Clinical - Lab/Test Results >> Sep 04, 2023 11:56 AM Juliana Ocean wrote: Reason for CRM: stacy w/ [facility] to give call report   Stacy with [facility] called stating that the patient's recent CT ha resulted and that the radiologist recommends a 3 month follow up CT chest without contrast due to lung nodules and interstitial pneumonitis    Reason for Disposition  Lab or radiology calling with test results  Answer Assessment - Initial Assessment Questions 1. REASON FOR CALL or QUESTION: "What is your reason for calling today?" or "How can I best help you?" or "What question do you have that I can help answer?"     Calling with CT results  2. CALLER: Document the source of call. (e.g., laboratory, patient).     [facility]  Protocols used: PCP Call - No Triage-A-AH

## 2023-09-07 DIAGNOSIS — I429 Cardiomyopathy, unspecified: Secondary | ICD-10-CM | POA: Diagnosis not present

## 2023-09-07 DIAGNOSIS — I5023 Acute on chronic systolic (congestive) heart failure: Secondary | ICD-10-CM | POA: Diagnosis not present

## 2023-09-08 ENCOUNTER — Ambulatory Visit
Admission: RE | Admit: 2023-09-08 | Discharge: 2023-09-08 | Disposition: A | Discharge status: Home / Self Care | Source: Ambulatory Visit | Attending: Internal Medicine | Admitting: Internal Medicine

## 2023-09-08 ENCOUNTER — Ambulatory Visit

## 2023-09-08 ENCOUNTER — Ambulatory Visit: Payer: Medicare Other | Admitting: Internal Medicine

## 2023-09-08 DIAGNOSIS — N6342 Unspecified lump in left breast, subareolar: Secondary | ICD-10-CM

## 2023-09-08 DIAGNOSIS — N62 Hypertrophy of breast: Secondary | ICD-10-CM | POA: Diagnosis not present

## 2023-09-14 DIAGNOSIS — I5023 Acute on chronic systolic (congestive) heart failure: Secondary | ICD-10-CM | POA: Diagnosis not present

## 2023-09-14 DIAGNOSIS — I429 Cardiomyopathy, unspecified: Secondary | ICD-10-CM | POA: Diagnosis not present

## 2023-09-16 NOTE — Telephone Encounter (Signed)
  Phillip Rich please let him know that there were some new nodules in CT is recommended in 3 months.  I can discuss this with him at follow-up.  Please ensure follow-up  IMPRESSION: 1. Pulmonary parenchymal pattern of fibrosis, as detailed above, minimally progressive from 01/28/2023. Findings may be due to fibrotic hypersensitivity pneumonitis or fibrotic nonspecific interstitial pneumonitis. Findings are indeterminate for UIP per consensus guidelines: Diagnosis of Idiopathic Pulmonary Fibrosis: An Official ATS/ERS/JRS/ALAT Clinical Practice Guideline. Am Annie Barton Crit Care Med Vol 198, Iss 5, [phone], Jan 10 2017. 2. New areas of nodular ground-glass, nodular consolidation and pulmonary nodules possibly infectious/inflammatory in etiology. Consider follow-up CT chest without contrast in 3 months from today's study date to ensure resolution. These results will be called to the ordering clinician or representative by the Radiologist Assistant, and communication documented in the PACS or [employer]. 3. Aortic atherosclerosis (ICD10-I70.0). Coronary artery calcification. 4. Enlarged pulmonic trunk, indicative of pulmonary arterial hypertension. 5. Emphysema (ICD10-J43.9). 6. Low-dose CT lung cancer screening is recommended for patients who are 57-44 years of age with a 20+ pack-year history of smoking and who are currently smoking or quit <=15 years ago.     Electronically Signed   By: Shearon Denis M.D.   On: 09/04/2023 11:35

## 2023-09-17 NOTE — Telephone Encounter (Signed)
 Called the pt and there was no answer- LMTCB

## 2023-09-21 ENCOUNTER — Ambulatory Visit (HOSPITAL_COMMUNITY)
Admission: RE | Admit: 2023-09-21 | Discharge: 2023-09-21 | Disposition: A | Discharge status: Home / Self Care | Source: Ambulatory Visit | Attending: Cardiology | Admitting: Cardiology

## 2023-09-21 VITALS — BP 110/76 | HR 105 | Wt 166.0 lb

## 2023-09-21 DIAGNOSIS — I5084 End stage heart failure: Secondary | ICD-10-CM | POA: Diagnosis not present

## 2023-09-21 DIAGNOSIS — Z720 Tobacco use: Secondary | ICD-10-CM

## 2023-09-21 DIAGNOSIS — J449 Chronic obstructive pulmonary disease, unspecified: Secondary | ICD-10-CM | POA: Insufficient documentation

## 2023-09-21 DIAGNOSIS — Z79899 Other long term (current) drug therapy: Secondary | ICD-10-CM | POA: Diagnosis not present

## 2023-09-21 DIAGNOSIS — I5023 Acute on chronic systolic (congestive) heart failure: Secondary | ICD-10-CM | POA: Diagnosis not present

## 2023-09-21 DIAGNOSIS — F17210 Nicotine dependence, cigarettes, uncomplicated: Secondary | ICD-10-CM | POA: Insufficient documentation

## 2023-09-21 DIAGNOSIS — Z7984 Long term (current) use of oral hypoglycemic drugs: Secondary | ICD-10-CM | POA: Insufficient documentation

## 2023-09-21 DIAGNOSIS — I110 Hypertensive heart disease with heart failure: Secondary | ICD-10-CM | POA: Insufficient documentation

## 2023-09-21 DIAGNOSIS — I5022 Chronic systolic (congestive) heart failure: Secondary | ICD-10-CM | POA: Diagnosis not present

## 2023-09-21 DIAGNOSIS — Z7985 Long-term (current) use of injectable non-insulin antidiabetic drugs: Secondary | ICD-10-CM | POA: Insufficient documentation

## 2023-09-21 DIAGNOSIS — I2510 Atherosclerotic heart disease of native coronary artery without angina pectoris: Secondary | ICD-10-CM | POA: Insufficient documentation

## 2023-09-21 DIAGNOSIS — Z716 Tobacco abuse counseling: Secondary | ICD-10-CM | POA: Insufficient documentation

## 2023-09-21 DIAGNOSIS — E119 Type 2 diabetes mellitus without complications: Secondary | ICD-10-CM | POA: Diagnosis not present

## 2023-09-21 DIAGNOSIS — I429 Cardiomyopathy, unspecified: Secondary | ICD-10-CM | POA: Diagnosis not present

## 2023-09-21 NOTE — Patient Instructions (Signed)
  Follow-Up in: 4 WEEKS AS SCHEDULED   At the [facility], you and your health needs are our priority. We have a designated team specialized in the treatment of Heart Failure. This Care Team includes your primary Heart Failure Specialized Cardiologist (physician), Advanced Practice Providers (APPs- Physician Assistants and Nurse Practitioners), and Pharmacist who all work together to provide you with the care you need, when you need it.   You may see any of the following providers on your designated Care Team at your next follow up:  Dr. Jules Oar Dr. Peder Bourdon Dr. Alwin Baars Dr. Judyth Nunnery Nieves Bars, NP Ruddy Corral, [place] [facility] [place], [place] Dennise Fitz, NP [place] Lee, NP Luster Salters, PharmD   Please be sure to bring in all your medications bottles to every appointment.   Need to Contact Us :  If you have any questions or concerns before your next appointment please send us  a message through [facility] or call our office at [phone].    TO LEAVE A MESSAGE FOR THE NURSE SELECT OPTION 2, PLEASE LEAVE A MESSAGE INCLUDING: YOUR NAME DATE OF BIRTH CALL BACK NUMBER REASON FOR CALL**this is important as we prioritize the call backs  YOU WILL RECEIVE A CALL BACK THE SAME DAY AS LONG AS YOU CALL BEFORE 4:00 PM

## 2023-09-21 NOTE — Progress Notes (Signed)
 [facility] NOTE  Referring Physician: Benedetta Bradley, *  Primary Care: Benedetta Bradley, MD Primary Cardiologist: N/A  CC: End stage heart failure, Stage D on home palliative inotropes  HPI: Phillip Rich is a 73 y.o. male with T2DM, HTN, renal mass s/p nephrectomy, COPD that presented to the [facility] ER w/ acute onset SOB on 08/27 when he was diagnosed with acute PE and HFrEF w/ an LVEF of 25%-30%; during admission, coronary angiography was significant for CTO of the RCA and diffusely diseased Lcx. RHC w/ elevated pre and post capillary filling pressures. He was diuresed and discharged home and started on some GDMT. Since that time, Phillip Rich has had a CMR demonstrating persistently reduced LV function (EF 20%) and CPX with severe cardiac limitations.   He was directly admitted for low output heart failure in late 2024.  During that time right heart catheterization consistent with severely reduced cardiac index.  He was started on IV inotropes and underwent LVAD evaluation.  Unfortunately LVAD evaluation was placed on hold after CT chest demonstrated severe lung disease with severely reduced DLCO.  He was ultimately discharged home on IV milrinone .  Since initiation of milrinone  he has had significant improvement in quality of life.   CT chest- with some new nodules. He will need another scan in 3 months.   Interval hx:  - March 20 th Milrinone  cut back milrinone  0.125mcg/kg/min  -08/28/23 Saw EP for possible CCM or BAT.   -09/02/23 CO-OX 55% on milrinone  0.125 mcg.   Per EP 09/21/23 Phillip Rich.  We aren't able to offer him anything. We've tried calling the pt a bunch but weren't able to get through.  BNP excludes him from [place], and the EP MDs didn't think he would be likely to benefit from CCM, with high risk.   Today he returns for HF follow up. He has good days and bad days. Gets a SOB exertion.  DeniesPND/Orthopnea. Appetite ok. No fever or  chills. Weight at home 165-168 pounds. Taking all medications. No nausea or diarrhea. Remains on Milrinone  0.125 mcg. He is concerned about monthly cost 0.125 mcg. He does not want LVAD. Lives alone. Continues to smoke every now and then.    Current Outpatient Medications  Medication Sig Dispense Refill   albuterol  (VENTOLIN  HFA) 108 (90 Base) MCG/ACT inhaler Inhale 1-2 puffs into the lungs every 6 (six) hours as needed for wheezing or shortness of breath. 1 each 0   atorvastatin  (LIPITOR ) 80 MG tablet Take 1 tablet (80 mg total) by mouth daily. 90 tablet 3   Blood Glucose Monitoring Suppl (ONETOUCH VERIO FLEX SYSTEM) w/Device KIT Use to check blood sugar for 365 days twice a day DX: E11.65     Cholecalciferol  (VITAMIN D  PO) Take 1,000 Units by mouth every morning.     digoxin  (LANOXIN ) 0.125 MG tablet Take 1 tablet (125 mcg total) by mouth daily. 90 tablet 0   DULoxetine  (CYMBALTA ) 60 MG capsule Take 60 mg by mouth daily.     empagliflozin  (JARDIANCE ) 10 MG TABS tablet Take 1 tablet (10 mg total) by mouth daily before breakfast. 90 tablet 3   eplerenone  (INSPRA ) 25 MG tablet Take 0.5 tablets (12.5 mg total) by mouth daily. 45 tablet 3   fluticasone  (FLONASE ) 50 MCG/ACT nasal spray Place 1 spray into both nostrils 2 (two) times a week.     Fluticasone  Furoate (ARNUITY ELLIPTA ) 100 MCG/ACT AEPB Inhale 1 puff into the lungs daily. 30  each 11   Lancets (ONETOUCH DELICA PLUS LANCET33G) MISC Apply topically 2 (two) times daily.     levocetirizine (XYZAL) 5 MG tablet Take 5 mg by mouth daily.     Magnesium  Oxide -Mg Supplement 200 MG TABS Take 1 tablet (200 mg total) by mouth daily at 12 noon. 30 tablet 11   Melatonin 10 MG TABS Take 10 mg by mouth at bedtime as needed (Sleep).     metFORMIN (GLUCOPHAGE) 1000 MG tablet Take 1,000 mg by mouth 2 (two) times daily.      milrinone  (PRIMACOR ) 20 MG/100 ML SOLN infusion Inject 0.0098 mg/min into the vein continuous.     omeprazole (PRILOSEC) 20 MG capsule  Take 1 tablet by mouth every morning.      ONETOUCH VERIO test strip Check blood sugar In Vitro twice a day DX: E11.65 for 100 days     sacubitril -valsartan  (ENTRESTO ) 24-26 MG Take 1 tablet by mouth 2 (two) times daily. 1 tablet in the am and 1/2 tablet in the evening     Semaglutide,0.25 or 0.5MG /DOS, (OZEMPIC, 0.25 OR 0.5 MG/DOSE,) 2 MG/3ML SOPN Inject 0.5 mg into the skin once a week. On Mondays     torsemide  (DEMADEX ) 20 MG tablet Take 2 tablets (40 mg total) by mouth daily. 60 tablet 3   vitamin B-12 (CYANOCOBALAMIN) 100 MCG tablet Take 100 mcg by mouth daily.     zolpidem  (AMBIEN ) 10 MG tablet Take 10 mg by mouth at bedtime as needed for sleep.     No current facility-administered medications for this encounter.   Facility-Administered Medications Ordered in Other Encounters  Medication Dose Route Frequency Provider Last Rate Last Admin   alteplase  (CATHFLO ACTIVASE ) injection 2 mg  2 mg Intracatheter Once Sabharwal, [name], DO         PHYSICAL EXAM: Vitals:   09/21/23 1355  BP: 110/76  Pulse: (!) 105  SpO2: 98%   Wt Readings from Last 3 Encounters:  09/21/23 75.3 kg (166 lb)  09/02/23 76 kg (167 lb 9.6 oz)  08/28/23 76.9 kg (169 lb 8 oz)    General:   No resp difficulty Neck: supple. no JVD.  Cor: PMI nondisplaced. Regular rate & rhythm. No rubs, gallops or murmurs. R upper chest tunneled PICC.  Lungs: clear Abdomen: soft, nontender, nondistended.  Extremities: no cyanosis, clubbing, rash, edema Neuro: alert & oriented x3  DATA REVIEW  ECG: 02/28/22: NSR  ECHO: TTE 01/10/22: LVEF 15%-20%, RV function moderately reduced. Moderate MR.  TTE 11/25/22: LVEF 30%,   CMR 04/21/22: 1. Moderate LV dilatation with severe systolic dysfunction (EF 20%). There is akinesis of basal inferior wall and thinning/akinesis of mid to apical anterior wall  2.  Normal RV size with mild systolic dysfunction (EF 40%) 3. Subendocardial late gadolinium enhancement consistent with  prior infarct in basal inferior, mid to apical anterior, and apical lateral walls. LGE is less than 50% transmural in basal inferior wall, suggesting viability. LGE is greater than 50% transmural in mid to apical anterior and apical lateral walls, suggesting nonviability in this region.  4.  Moderate tricuspid regurgitation (regurgitant fraction 23%)  5. Mild mitral regurgitation (regurgitant fraction 10%)  CATH: 01/15/22   Ost LM lesion is 20% stenosed.   Prox Cx to Dist Cx lesion is 70% stenosed.   Prox LAD to Mid LAD lesion is 30% stenosed.   Mid RCA lesion is 100% stenosed.   Prox RCA lesion is 70% stenosed.   1.  Significant two-vessel coronary artery  disease with chronically occluded mid right coronary artery with left to right as well as bridging collaterals.  The left circumflex is tortuous and diffusely diseased in the midsegment. 2.  Left ventricular angiography was not performed.  EF was severely reduced by echo. 3.  Right heart catheterization showed significantly elevated right and left-sided filling pressures, moderate to severe pulmonary hypertension and moderately reduced cardiac output.  08/05/22: HEMODYNAMICS: RA:                  6 mmHg (mean) RV:                  27/4-6 mmHg PA:                  28/15 mmHg (21 mean) PCWP:            14 mmHg (mean)                                      Estimated Fick CO/CI   4.5 L/min, 2.2 L/min/m2 Thermodilution CO/CI   4.5 L/min, 2.2 L/min/m2                                              TPG                 7  mmHg                                              PVR                 1.5 Wood Units  PAPi                2.2    01/28/23:  RA mean 17 RV 57/21 PA 58/30, mean 39 PCWP mean 28  Oxygen saturations: PA 42% AO 95%  Cardiac Output (Fick) 3.37  Cardiac Index (Fick) 1.66   Cardiac Output (Fick) 3.20  Cardiac Index (Fick) 1.58  PVR 3.4 WU   CPX: 06/20/22: Mildly submax test but based on available data there appears to  be a severe HF limitations with a blunted BP response to exercise and markedly elevated VeVCO2 slope. Consideration should be given to advanced HF therapies if clinically indicated.   ASSESSMENT & PLAN:  End-stage, Stage D, Heart failure with reduced EF, NYHA III Etiology of GN:FAOZH, ischemic and nonischemic components; now on home palliative inotropes. NYHA class / AHA Stage: III D on Palliative milrinone  0.125 mcg.   Volume status & Diuretics: Appears euvolemic. Continue torsemide  40 mg daily.  Vasodilators: currently taking Entresto  24/26mg  BID.  Beta-Blocker: No on milrinone .  MRA: Continue eplerenone  12.5mg  daily (reports gynecomastia).  Cardiometabolic: continue jardiance  10mg  daily Devices therapies & Valvulopathies: CMR w/ LVEF 20%.  Patient has met Dr. Rodolfo Clan for ICD evaluation.  In the interim he has had a very concerning CPX.  Repeat right heart cath with mildly reduced cardiac index.  He does not want to pursue ICD.  Advanced therapies:  Mr. Lapp had a CPX with a peak VO2 of 10.9 and severely elevated VE/VCO2 slope.  He does not want to pursue LVAD.  I reached out  to EP about CCM. He is not a candidate for CCM per EP and felt to be to high risk. He was not a candidate for Barostim due to elevated pro BNP.   2. Pulmonary embolism  - CT from 01/05/22 w/ acute PE involving lobar and segmental branches at the RLL, RML - off AC now.  -CT Chest with new nodules. He has follow up with Dr Bertrum Brodie.   3. COPD - has not required home O2;  - PFTs with severely reduced DLCO - Unable to start  pulmonary rehab due to inotropes.  - Follow up with Dr. Bertrum Brodie next month.    4. Tobacco abuse - Smoking every now and then.   - Continued discussing cessation.   5. Chronic palliative inotropes - He has been on palliative Milrinone . Needs ongoing GOC conversations.  He tells me he has had a wonderful life and will continue milrinone . I explained that Milrinone  is not a cure but for  quality of life. He appreciated the honest conversation and understands he has not other cardiac options at this time. We did discuss increasing milrinone  if needed but no other procedures are an option.   Follow up with Dr Bruce Caper in 4 weeks.   Matison Nuccio NP-C  2:00 PM

## 2023-09-23 ENCOUNTER — Ambulatory Visit: Payer: Self-pay | Admitting: *Deleted

## 2023-09-25 NOTE — Telephone Encounter (Signed)
 I called and spoke with the pt and notified of results/recs per MR  He verbalized understanding  Will keep 11/05/23 appt

## 2023-09-28 DIAGNOSIS — I429 Cardiomyopathy, unspecified: Secondary | ICD-10-CM | POA: Diagnosis not present

## 2023-09-28 DIAGNOSIS — I5023 Acute on chronic systolic (congestive) heart failure: Secondary | ICD-10-CM | POA: Diagnosis not present

## 2023-10-02 DIAGNOSIS — I429 Cardiomyopathy, unspecified: Secondary | ICD-10-CM | POA: Diagnosis not present

## 2023-10-02 DIAGNOSIS — I5023 Acute on chronic systolic (congestive) heart failure: Secondary | ICD-10-CM | POA: Diagnosis not present

## 2023-10-02 DIAGNOSIS — I509 Heart failure, unspecified: Secondary | ICD-10-CM | POA: Diagnosis not present

## 2023-10-12 DIAGNOSIS — I5023 Acute on chronic systolic (congestive) heart failure: Secondary | ICD-10-CM | POA: Diagnosis not present

## 2023-10-12 DIAGNOSIS — I429 Cardiomyopathy, unspecified: Secondary | ICD-10-CM | POA: Diagnosis not present

## 2023-10-16 ENCOUNTER — Telehealth (HOSPITAL_COMMUNITY): Payer: Self-pay | Admitting: Cardiology

## 2023-10-16 NOTE — Telephone Encounter (Signed)
 Called to confirm/remind patient of their appointment at the [facility] on 10/16/23.   Appointment:   [] Confirmed  [x] Left mess   [] No answer/No voice mail  [] VM Full/unable to leave message  [] Phone not in service  Patient reminded to bring all medications and/or complete list.  Confirmed patient has transportation. Gave directions, instructed to utilize valet parking.

## 2023-10-18 IMAGING — CT CT CHEST HIGH RESOLUTION
2 of 7 series · 14 of 36 positions shown, 17 images · non-contrast
Comparison: 05/27/2019.

CLINICAL DATA: Pulmonary fibrosis, current smoker.



[Series 4: chest 2.00 br36 s3 cor soft · coronal · 0.57mm/px · 3 of 168 slices shown]
[im 34/168  lung]
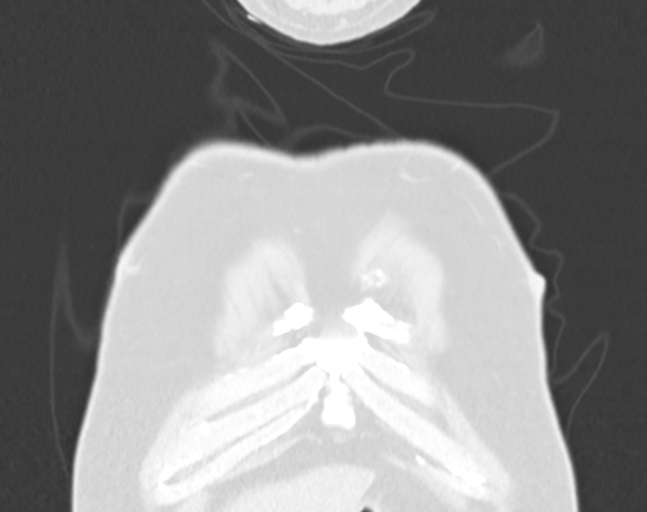
[im 67/168  lung]
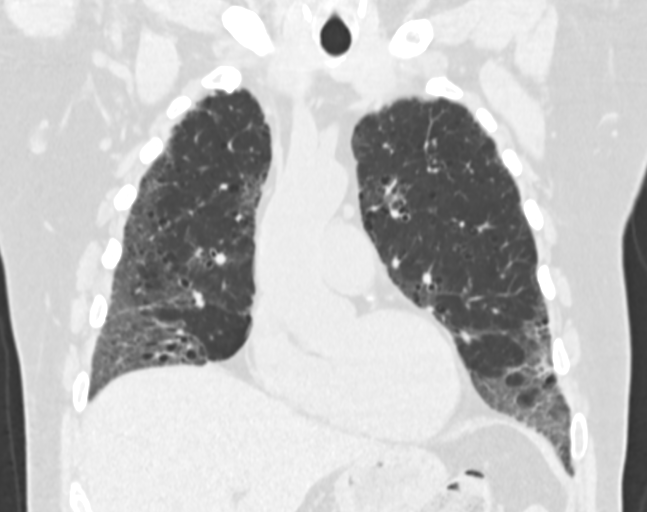
[im 101/168  lung]
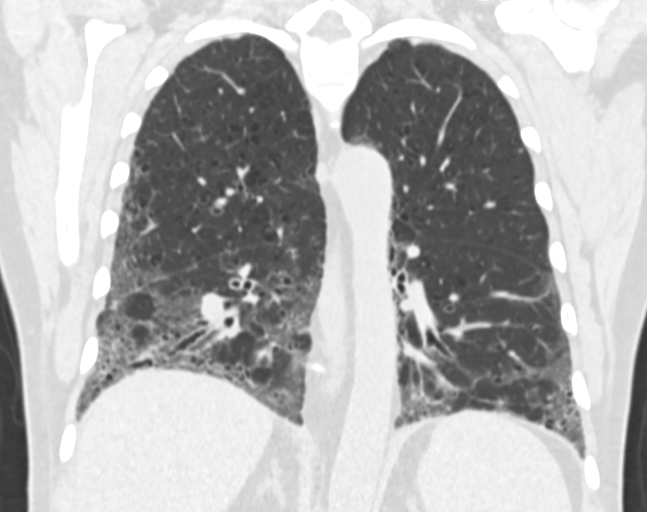

[Series 11: chest 1.00 br60 s3 high res thins 1x1 mm · axial · 0.72mm/px · z∈[+1517,+1765]mm · 11 of 294 slices shown, 14 images]
[im 23/294  mediastinal]
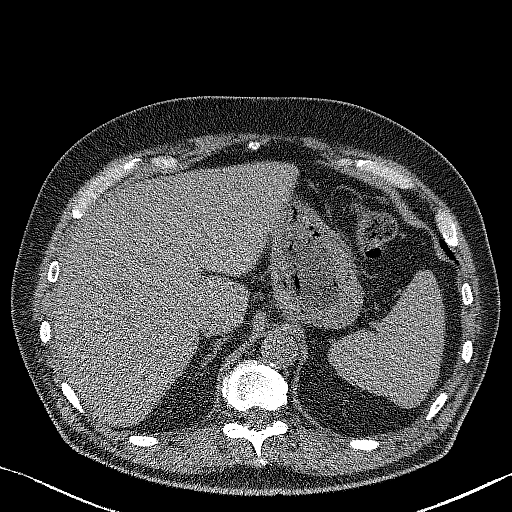
[im 23/294  lung]
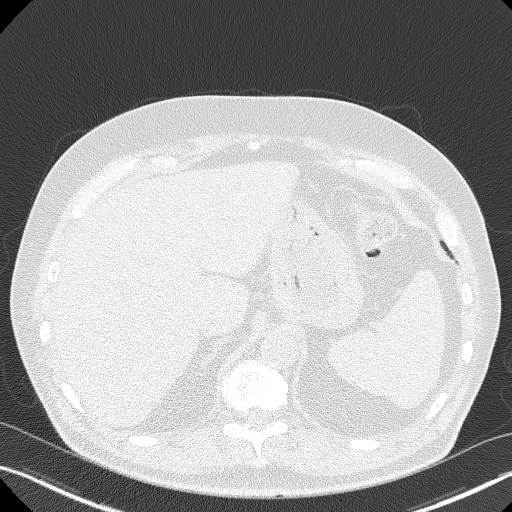
[im 46/294  lung]
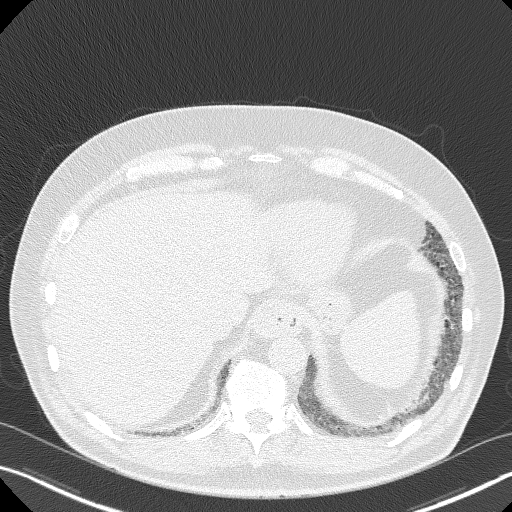
[im 68/294  lung]
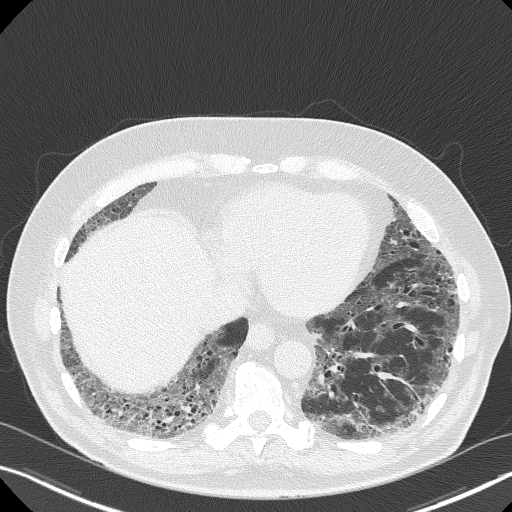
[im 91/294  lung]
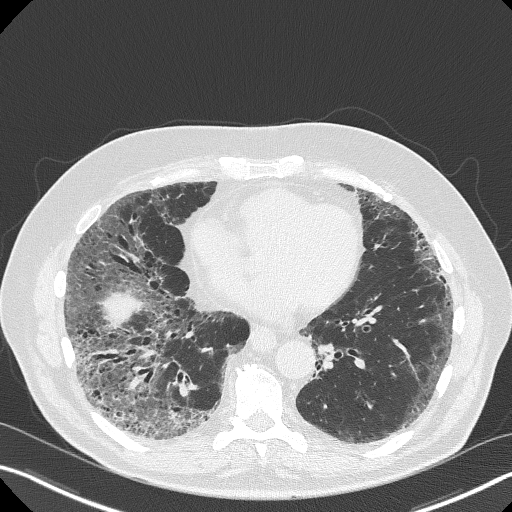
[im 113/294  mediastinal]
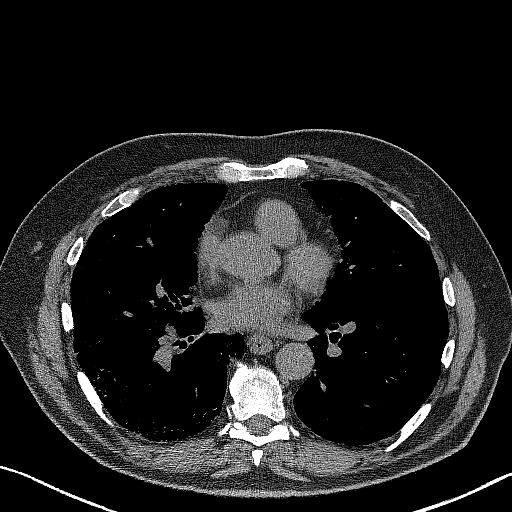
[im 113/294  lung]
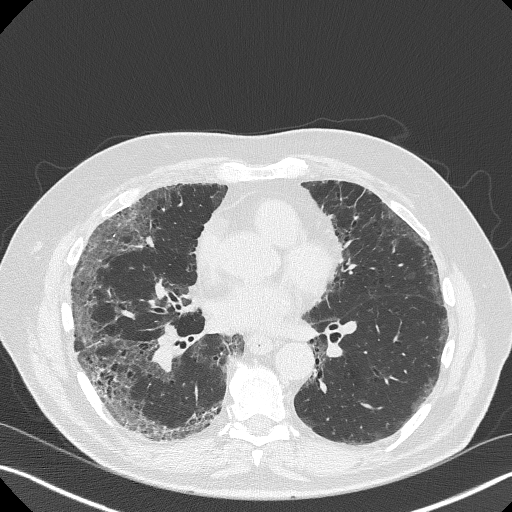
[im 158/294  lung]
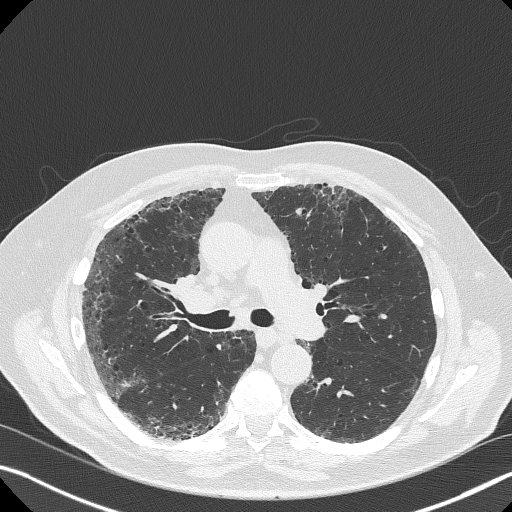
[im 181/294  lung]
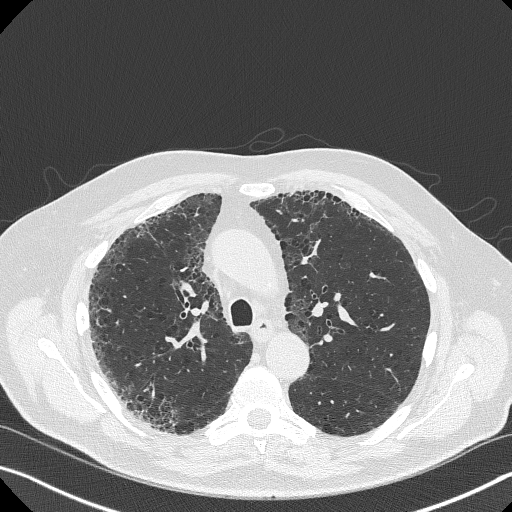
[im 203/294  lung]
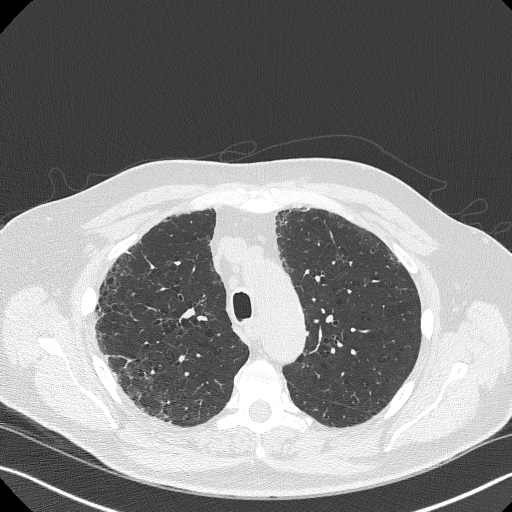
[im 226/294  mediastinal]
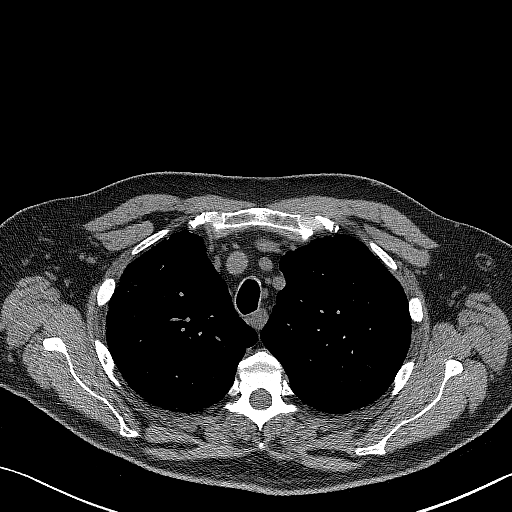
[im 226/294  lung]
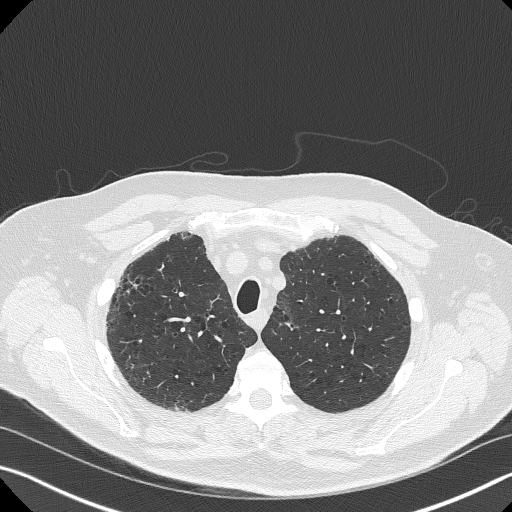
[im 248/294  lung]
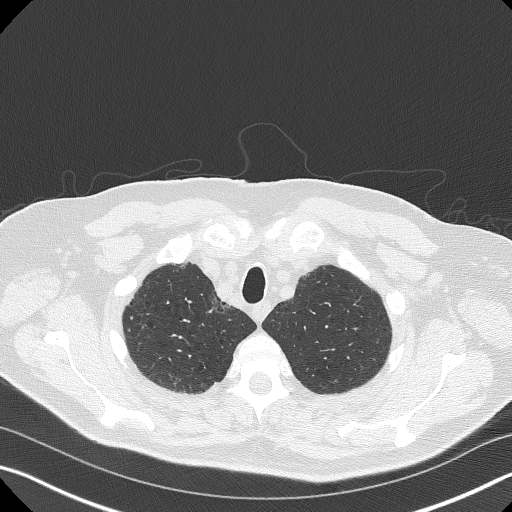
[im 271/294  lung]
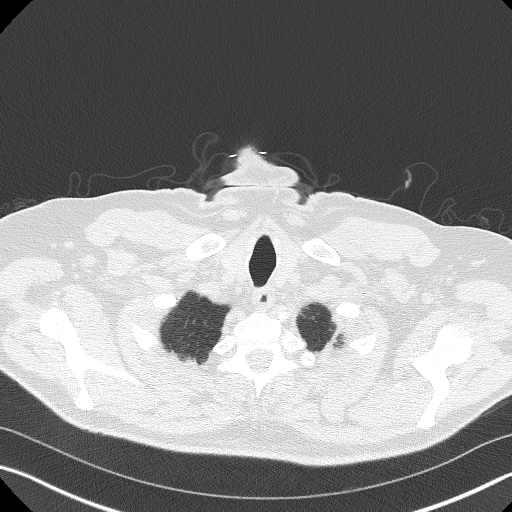

[14 of 36 positions shown; findings below may reference images not displayed]

FINDINGS: Cardiovascular: Atherosclerotic calcification of the aorta, aortic
valve and coronary arteries. Enlarged pulmonic trunk. Heart size
normal. No pericardial effusion.

Mediastinum/Nodes: Mediastinal lymph nodes measure up to 1.1 cm in
the right paratracheal station, as before. Hilar regions are
difficult to definitively evaluate without IV contrast. No axillary
adenopathy. Esophagus is grossly unremarkable. Small hiatal hernia.

Lungs/Pleura: Centrilobular and paraseptal emphysema. Superimposed
peripheral and basilar predominant ground-glass with traction
bronchiectasis/bronchiolectasis, similar to 05/27/2019. No
honeycombing. Scattered subpleural reticular densities. No pleural
fluid. Debris is seen in the airway. No air trapping.

Upper Abdomen: Visualized portions of the liver, adrenal glands,
spleen stomach and bowel are unremarkable with the exception an
aforementioned small hiatal hernia.

Musculoskeletal: Degenerative changes in the spine. No worrisome
lytic or sclerotic lesions.
IMPRESSION: 1. Pulmonary parenchymal pattern of fibrosis, as detailed above,
similar to 05/27/2019. Findings are categorized as probable UIP per
consensus guidelines: Diagnosis of Idiopathic Pulmonary Fibrosis: An
Official ATS/ERS/JRS/ALAT Clinical Practice Guideline. Am J Respir
Crit Care Med Vol 198, Syka 5, [id number][DATE].
2. Small to borderline enlarged mediastinal lymph nodes can be seen
in the setting of interstitial lung disease.
3. Small hiatal hernia.
4. Aortic atherosclerosis ([id number]). Coronary artery
calcification.
5. Enlarged pulmonic trunk, indicative of pulmonary arterial
hypertension.
6.  Emphysema ([id number]).

## 2023-10-18 NOTE — Progress Notes (Signed)
 ADVANCED HEART FAILURE CLINIC NOTE  Referring Physician: Benedetta Bradley, *  Primary Care: Benedetta Bradley, MD Primary Cardiologist: N/A  CC: End stage heart failure, Stage D on home palliative inotropes  HPI: Phillip Rich is a 73 y.o. male with T2DM, HTN, renal mass s/p nephrectomy, COPD that presented to the [facility] w/ acute onset SOB on 08/27 when he was diagnosed with acute PE and HFrEF w/ an LVEF of 25%-30%; during admission, coronary angiography was significant for CTO of the RCA and diffusely diseased Lcx. RHC w/ elevated pre and post capillary filling pressures. He was diuresed and discharged home and started on some GDMT. Since that time, Phillip Rich has had a CMR demonstrating persistently reduced LV function (EF 20%) and CPX with severe cardiac limitations.   He was directly admitted for low output heart failure in late 2024.  During that time right heart catheterization consistent with severely reduced cardiac index.  He was started on IV inotropes and underwent LVAD evaluation.  Unfortunately LVAD evaluation was placed on hold after CT chest demonstrated severe lung disease with severely reduced DLCO.  He was ultimately discharged home on IV milrinone .  Since initiation of milrinone  he has had significant improvement in quality of life.   CT chest- with some new nodules. He will need another scan in 3 months.   Interval hx:  - We decreased milrinone  to 0.125mcg/kg/min on 07/30/23. Since that time he has had a decline in functional status. He has become increasingly tired. He uses the motorized cart at the grocery store due to fatigue, however, can walk > 52ft. He was seen by EP for CCM however was denied.    Current Outpatient Medications  Medication Sig Dispense Refill   albuterol  (VENTOLIN  HFA) 108 (90 Base) MCG/ACT inhaler Inhale 1-2 puffs into the lungs every 6 (six) hours as needed for wheezing or shortness of breath. 1 each 0   atorvastatin  (LIPITOR )  80 MG tablet Take 1 tablet (80 mg total) by mouth daily. 90 tablet 3   Blood Glucose Monitoring Suppl (ONETOUCH VERIO FLEX SYSTEM) w/Device KIT Use to check blood sugar for 365 days twice a day DX: E11.65     Cholecalciferol  (VITAMIN D  PO) Take 1,000 Units by mouth every morning.     digoxin  (LANOXIN ) 0.125 MG tablet Take 1 tablet (125 mcg total) by mouth daily. 90 tablet 0   DULoxetine  (CYMBALTA ) 60 MG capsule Take 60 mg by mouth daily.     empagliflozin  (JARDIANCE ) 10 MG TABS tablet Take 1 tablet (10 mg total) by mouth daily before breakfast. 90 tablet 3   eplerenone  (INSPRA ) 25 MG tablet Take 0.5 tablets (12.5 mg total) by mouth daily. 45 tablet 3   fluticasone  (FLONASE ) 50 MCG/ACT nasal spray Place 1 spray into both nostrils 2 (two) times a week.     Fluticasone  Furoate (ARNUITY ELLIPTA ) 100 MCG/ACT AEPB Inhale 1 puff into the lungs daily. 30 each 11   Lancets (ONETOUCH DELICA PLUS LANCET33G) MISC Apply topically 2 (two) times daily.     levocetirizine (XYZAL) 5 MG tablet Take 5 mg by mouth daily.     Magnesium  Oxide -Mg Supplement 200 MG TABS Take 1 tablet (200 mg total) by mouth daily at 12 noon. 30 tablet 11   Melatonin 10 MG TABS Take 10 mg by mouth at bedtime as needed (Sleep).     metFORMIN (GLUCOPHAGE) 1000 MG tablet Take 1,000 mg by mouth 2 (two) times daily.  milrinone  (PRIMACOR ) 20 MG/100 ML SOLN infusion Inject 0.0098 mg/min into the vein continuous.     omeprazole (PRILOSEC) 20 MG capsule Take 1 tablet by mouth every morning.      ONETOUCH VERIO test strip Check blood sugar In Vitro twice a day DX: E11.65 for 100 days     sacubitril -valsartan  (ENTRESTO ) 24-26 MG Take 1 tablet by mouth 2 (two) times daily. 1 tablet in the am and 1/2 tablet in the evening     Semaglutide,0.25 or 0.5MG /DOS, (OZEMPIC, 0.25 OR 0.5 MG/DOSE,) 2 MG/3ML SOPN Inject 0.5 mg into the skin once a week. On Mondays     torsemide  (DEMADEX ) 20 MG tablet Take 2 tablets (40 mg total) by mouth daily. 60 tablet 3    vitamin B-12 (CYANOCOBALAMIN) 100 MCG tablet Take 100 mcg by mouth daily.     zolpidem  (AMBIEN ) 10 MG tablet Take 10 mg by mouth at bedtime as needed for sleep.     No current facility-administered medications for this encounter.   Facility-Administered Medications Ordered in Other Encounters  Medication Dose Route Frequency Provider Last Rate Last Admin   alteplase  (CATHFLO ACTIVASE ) injection 2 mg  2 mg Intracatheter Once Jerren Flinchbaugh, DO         PHYSICAL EXAM:  Wt Readings from Last 3 Encounters:  10/19/23 76.1 kg (167 lb 12.8 oz)  09/21/23 75.3 kg (166 lb)  09/02/23 76 kg (167 lb 9.6 oz)    Vitals:   10/19/23 1456  BP: 104/66  Pulse: 99  SpO2: 96%   GENERAL: fatigued Lungs- CTA CARDIAC:  JVP: 5 cm          Normal rate with regular rhythm. no murmur.  Pulses 1+. no edema.  ABDOMEN: Soft, non-tender, non-distended.  EXTREMITIES: Warm and well perfused.  NEUROLOGIC: No obvious FND   DATA REVIEW  ECG: 02/28/22: NSR  ECHO: TTE 01/10/22: LVEF 15%-20%, RV function moderately reduced. Moderate MR.  TTE 11/25/22: LVEF 30%,   CMR 04/21/22: 1. Moderate LV dilatation with severe systolic dysfunction (EF 20%). There is akinesis of basal inferior wall and thinning/akinesis of mid to apical anterior wall  2.  Normal RV size with mild systolic dysfunction (EF 40%) 3. Subendocardial late gadolinium enhancement consistent with prior infarct in basal inferior, mid to apical anterior, and apical lateral walls. LGE is less than 50% transmural in basal inferior wall, suggesting viability. LGE is greater than 50% transmural in mid to apical anterior and apical lateral walls, suggesting nonviability in this region.  4.  Moderate tricuspid regurgitation (regurgitant fraction 23%)  5. Mild mitral regurgitation (regurgitant fraction 10%)  CATH: 01/15/22   Ost LM lesion is 20% stenosed.   Prox Cx to Dist Cx lesion is 70% stenosed.   Prox LAD to Mid LAD lesion is 30%  stenosed.   Mid RCA lesion is 100% stenosed.   Prox RCA lesion is 70% stenosed.   1.  Significant two-vessel coronary artery disease with chronically occluded mid right coronary artery with left to right as well as bridging collaterals.  The left circumflex is tortuous and diffusely diseased in the midsegment. 2.  Left ventricular angiography was not performed.  EF was severely reduced by echo. 3.  Right heart catheterization showed significantly elevated right and left-sided filling pressures, moderate to severe pulmonary hypertension and moderately reduced cardiac output.  08/05/22: HEMODYNAMICS: RA:                  6 mmHg (mean) RV:  27/4-6 mmHg PA:                  28/15 mmHg (21 mean) PCWP:            14 mmHg (mean)                                      Estimated Fick CO/CI   4.5 L/min, 2.2 L/min/m2 Thermodilution CO/CI   4.5 L/min, 2.2 L/min/m2                                              TPG                 7  mmHg                                              PVR                 1.5 Wood Units  PAPi                2.2    01/28/23:  RA mean 17 RV 57/21 PA 58/30, mean 39 PCWP mean 28  Oxygen saturations: PA 42% AO 95%  Cardiac Output (Fick) 3.37  Cardiac Index (Fick) 1.66   Cardiac Output (Fick) 3.20  Cardiac Index (Fick) 1.58  PVR 3.4 WU   CPX: 06/20/22: Mildly submax test but based on available data there appears to be a severe HF limitations with a blunted BP response to exercise and markedly elevated VeVCO2 slope. Consideration should be given to advanced HF therapies if clinically indicated.   ASSESSMENT & PLAN:  End-stage, Stage D, Heart failure with reduced EF, NYHA III Etiology of ZO:XWRUE, ischemic and nonischemic components; now on home palliative inotropes. NYHA class / AHA Stage: III D on Palliative milrinone  0.125 mcg.   Volume status & Diuretics: Appears euvolemic. Continue torsemide  40 mg daily.  Vasodilators: currently taking Entresto   24/26mg  BID.  Beta-Blocker: holding beta blockers.  MRA: Continue eplerenone  12.5mg  daily (reports gynecomastia).  Cardiometabolic: continue jardiance  10mg  daily Devices therapies & Valvulopathies: CMR w/ LVEF 20%.  Patient has met Dr. Rodolfo Rich for ICD evaluation.  In the interim he has had a very concerning CPX.  Repeat right heart cath with mildly reduced cardiac index.  He does not want to pursue ICD.  Advanced therapies:  Phillip Rich had a CPX with a peak VO2 of 10.9 and severely elevated VE/VCO2 slope.  He does not want to pursue LVAD.  -He has had a decline in functional status once more; currently dependent on milrinone , however, facing financial difficulties due to its costs.  -We will repeat a RHC to assess cardiac output and need to uptitrate milrinone  vs pursuing CCM. Discussed risks benefits extensively.   2. Pulmonary embolism  - CT from 01/05/22 w/ acute PE involving lobar and segmental branches at the RLL, RML - off AC now.  -CT Chest with new nodules. He has follow up with Dr Phillip Rich.  - stable.   3. COPD - has not required home O2;  - PFTs with severely reduced DLCO - Unable to start  pulmonary rehab due to  inotropes.  - Follow up with Phillip Rich next month.   - Will use inhalers PRN.   4. Tobacco abuse - Smoking every now and then.   - Continued discussing cessation.   5. Chronic palliative inotropes - We once again discussed GOC. We discussed that milrinone  only improves quality of life but has not demonstrated improvement in mortality. He does not wish to pursue advanced therapies at this time.  - see above.   Sly Parlee 5:21 PM

## 2023-10-18 NOTE — H&P (View-Only) (Signed)
 ADVANCED HEART FAILURE CLINIC NOTE  Referring Physician: Benedetta Bradley, *  Primary Care: Benedetta Bradley, MD Primary Cardiologist: N/A  CC: End stage heart failure, Stage D on home palliative inotropes  HPI: Phillip Rich is a 73 y.o. male with T2DM, HTN, renal mass s/p nephrectomy, COPD that presented to the [facility] ER w/ acute onset SOB on 08/27 when he was diagnosed with acute PE and HFrEF w/ an LVEF of 25%-30%; during admission, coronary angiography was significant for CTO of the RCA and diffusely diseased Lcx. RHC w/ elevated pre and post capillary filling pressures. He was diuresed and discharged home and started on some GDMT. Since that time, Phillip Rich has had a CMR demonstrating persistently reduced LV function (EF 20%) and CPX with severe cardiac limitations.   He was directly admitted for low output heart failure in late 2024.  During that time right heart catheterization consistent with severely reduced cardiac index.  He was started on IV inotropes and underwent LVAD evaluation.  Unfortunately LVAD evaluation was placed on hold after CT chest demonstrated severe lung disease with severely reduced DLCO.  He was ultimately discharged home on IV milrinone .  Since initiation of milrinone  he has had significant improvement in quality of life.   CT chest- with some new nodules. He will need another scan in 3 months.   Interval hx:  - We decreased milrinone  to 0.125mcg/kg/min on 07/30/23. Since that time he has had a decline in functional status. He has become increasingly tired. He uses the motorized cart at the grocery store due to fatigue, however, can walk > 52ft. He was seen by EP for CCM however was denied.    Current Outpatient Medications  Medication Sig Dispense Refill   albuterol  (VENTOLIN  HFA) 108 (90 Base) MCG/ACT inhaler Inhale 1-2 puffs into the lungs every 6 (six) hours as needed for wheezing or shortness of breath. 1 each 0   atorvastatin  (LIPITOR )  80 MG tablet Take 1 tablet (80 mg total) by mouth daily. 90 tablet 3   Blood Glucose Monitoring Suppl (ONETOUCH VERIO FLEX SYSTEM) w/Device KIT Use to check blood sugar for 365 days twice a day DX: E11.65     Cholecalciferol  (VITAMIN D  PO) Take 1,000 Units by mouth every morning.     digoxin  (LANOXIN ) 0.125 MG tablet Take 1 tablet (125 mcg total) by mouth daily. 90 tablet 0   DULoxetine  (CYMBALTA ) 60 MG capsule Take 60 mg by mouth daily.     empagliflozin  (JARDIANCE ) 10 MG TABS tablet Take 1 tablet (10 mg total) by mouth daily before breakfast. 90 tablet 3   eplerenone  (INSPRA ) 25 MG tablet Take 0.5 tablets (12.5 mg total) by mouth daily. 45 tablet 3   fluticasone  (FLONASE ) 50 MCG/ACT nasal spray Place 1 spray into both nostrils 2 (two) times a week.     Fluticasone  Furoate (ARNUITY ELLIPTA ) 100 MCG/ACT AEPB Inhale 1 puff into the lungs daily. 30 each 11   Lancets (ONETOUCH DELICA PLUS LANCET33G) MISC Apply topically 2 (two) times daily.     levocetirizine (XYZAL) 5 MG tablet Take 5 mg by mouth daily.     Magnesium  Oxide -Mg Supplement 200 MG TABS Take 1 tablet (200 mg total) by mouth daily at 12 noon. 30 tablet 11   Melatonin 10 MG TABS Take 10 mg by mouth at bedtime as needed (Sleep).     metFORMIN (GLUCOPHAGE) 1000 MG tablet Take 1,000 mg by mouth 2 (two) times daily.  milrinone  (PRIMACOR ) 20 MG/100 ML SOLN infusion Inject 0.0098 mg/min into the vein continuous.     omeprazole (PRILOSEC) 20 MG capsule Take 1 tablet by mouth every morning.      ONETOUCH VERIO test strip Check blood sugar In Vitro twice a day DX: E11.65 for 100 days     sacubitril -valsartan  (ENTRESTO ) 24-26 MG Take 1 tablet by mouth 2 (two) times daily. 1 tablet in the am and 1/2 tablet in the evening     Semaglutide,0.25 or 0.5MG /DOS, (OZEMPIC, 0.25 OR 0.5 MG/DOSE,) 2 MG/3ML SOPN Inject 0.5 mg into the skin once a week. On Mondays     torsemide  (DEMADEX ) 20 MG tablet Take 2 tablets (40 mg total) by mouth daily. 60 tablet 3    vitamin B-12 (CYANOCOBALAMIN) 100 MCG tablet Take 100 mcg by mouth daily.     zolpidem  (AMBIEN ) 10 MG tablet Take 10 mg by mouth at bedtime as needed for sleep.     No current facility-administered medications for this encounter.   Facility-Administered Medications Ordered in Other Encounters  Medication Dose Route Frequency Provider Last Rate Last Admin   alteplase  (CATHFLO ACTIVASE ) injection 2 mg  2 mg Intracatheter Once Phillip Flinchbaugh, DO         PHYSICAL EXAM:  Wt Readings from Last 3 Encounters:  10/19/23 76.1 kg (167 lb 12.8 oz)  09/21/23 75.3 kg (166 lb)  09/02/23 76 kg (167 lb 9.6 oz)    Vitals:   10/19/23 1456  BP: 104/66  Pulse: 99  SpO2: 96%   GENERAL: fatigued Lungs- CTA CARDIAC:  JVP: 5 cm          Normal rate with regular rhythm. no murmur.  Pulses 1+. no edema.  ABDOMEN: Soft, non-tender, non-distended.  EXTREMITIES: Warm and well perfused.  NEUROLOGIC: No obvious FND   DATA REVIEW  ECG: 02/28/22: NSR  ECHO: TTE 01/10/22: LVEF 15%-20%, RV function moderately reduced. Moderate MR.  TTE 11/25/22: LVEF 30%,   CMR 04/21/22: 1. Moderate LV dilatation with severe systolic dysfunction (EF 20%). There is akinesis of basal inferior wall and thinning/akinesis of mid to apical anterior wall  2.  Normal RV size with mild systolic dysfunction (EF 40%) 3. Subendocardial late gadolinium enhancement consistent with prior infarct in basal inferior, mid to apical anterior, and apical lateral walls. LGE is less than 50% transmural in basal inferior wall, suggesting viability. LGE is greater than 50% transmural in mid to apical anterior and apical lateral walls, suggesting nonviability in this region.  4.  Moderate tricuspid regurgitation (regurgitant fraction 23%)  5. Mild mitral regurgitation (regurgitant fraction 10%)  CATH: 01/15/22   Ost LM lesion is 20% stenosed.   Prox Cx to Dist Cx lesion is 70% stenosed.   Prox LAD to Mid LAD lesion is 30%  stenosed.   Mid RCA lesion is 100% stenosed.   Prox RCA lesion is 70% stenosed.   1.  Significant two-vessel coronary artery disease with chronically occluded mid right coronary artery with left to right as well as bridging collaterals.  The left circumflex is tortuous and diffusely diseased in the midsegment. 2.  Left ventricular angiography was not performed.  EF was severely reduced by echo. 3.  Right heart catheterization showed significantly elevated right and left-sided filling pressures, moderate to severe pulmonary hypertension and moderately reduced cardiac output.  08/05/22: HEMODYNAMICS: RA:                  6 mmHg (mean) RV:  27/4-6 mmHg PA:                  28/15 mmHg (21 mean) PCWP:            14 mmHg (mean)                                      Estimated Fick CO/CI   4.5 L/min, 2.2 L/min/m2 Thermodilution CO/CI   4.5 L/min, 2.2 L/min/m2                                              TPG                 7  mmHg                                              PVR                 1.5 Wood Units  PAPi                2.2    01/28/23:  RA mean 17 RV 57/21 PA 58/30, mean 39 PCWP mean 28  Oxygen saturations: PA 42% AO 95%  Cardiac Output (Fick) 3.37  Cardiac Index (Fick) 1.66   Cardiac Output (Fick) 3.20  Cardiac Index (Fick) 1.58  PVR 3.4 WU   CPX: 06/20/22: Mildly submax test but based on available data there appears to be a severe HF limitations with a blunted BP response to exercise and markedly elevated VeVCO2 slope. Consideration should be given to advanced HF therapies if clinically indicated.   ASSESSMENT & PLAN:  End-stage, Stage D, Heart failure with reduced EF, NYHA III Etiology of ZO:XWRUE, ischemic and nonischemic components; now on home palliative inotropes. NYHA class / AHA Stage: III D on Palliative milrinone  0.125 mcg.   Volume status & Diuretics: Appears euvolemic. Continue torsemide  40 mg daily.  Vasodilators: currently taking Entresto   24/26mg  BID.  Beta-Blocker: holding beta blockers.  MRA: Continue eplerenone  12.5mg  daily (reports gynecomastia).  Cardiometabolic: continue jardiance  10mg  daily Devices therapies & Valvulopathies: CMR w/ LVEF 20%.  Patient has met Dr. Rodolfo Clan for ICD evaluation.  In the interim he has had a very concerning CPX.  Repeat right heart cath with mildly reduced cardiac index.  He does not want to pursue ICD.  Advanced therapies:  Mr. Eastham had a CPX with a peak VO2 of 10.9 and severely elevated VE/VCO2 slope.  He does not want to pursue LVAD.  -He has had a decline in functional status once more; currently dependent on milrinone , however, facing financial difficulties due to its costs.  -We will repeat a RHC to assess cardiac output and need to uptitrate milrinone  vs pursuing CCM. Discussed risks benefits extensively.   2. Pulmonary embolism  - CT from 01/05/22 w/ acute PE involving lobar and segmental branches at the RLL, RML - off AC now.  -CT Chest with new nodules. He has follow up with Dr Bertrum Brodie.  - stable.   3. COPD - has not required home O2;  - PFTs with severely reduced DLCO - Unable to start  pulmonary rehab due to  inotropes.  - Follow up with Dr. Bertrum Brodie next month.   - Will use inhalers PRN.   4. Tobacco abuse - Smoking every now and then.   - Continued discussing cessation.   5. Chronic palliative inotropes - We once again discussed GOC. We discussed that milrinone  only improves quality of life but has not demonstrated improvement in mortality. He does not wish to pursue advanced therapies at this time.  - see above.   Sly Parlee 5:21 PM

## 2023-10-19 ENCOUNTER — Encounter (HOSPITAL_COMMUNITY): Payer: Self-pay | Admitting: Cardiology

## 2023-10-19 ENCOUNTER — Other Ambulatory Visit (HOSPITAL_COMMUNITY): Payer: Self-pay

## 2023-10-19 ENCOUNTER — Ambulatory Visit (HOSPITAL_COMMUNITY)
Admission: RE | Admit: 2023-10-19 | Discharge: 2023-10-19 | Disposition: A | Discharge status: Home / Self Care | Source: Ambulatory Visit | Attending: Cardiology | Admitting: Cardiology

## 2023-10-19 VITALS — BP 104/66 | HR 99 | Wt 167.8 lb

## 2023-10-19 DIAGNOSIS — F17200 Nicotine dependence, unspecified, uncomplicated: Secondary | ICD-10-CM | POA: Insufficient documentation

## 2023-10-19 DIAGNOSIS — Z5986 Financial insecurity: Secondary | ICD-10-CM | POA: Insufficient documentation

## 2023-10-19 DIAGNOSIS — I272 Pulmonary hypertension, unspecified: Secondary | ICD-10-CM | POA: Diagnosis not present

## 2023-10-19 DIAGNOSIS — Z79899 Other long term (current) drug therapy: Secondary | ICD-10-CM

## 2023-10-19 DIAGNOSIS — I5022 Chronic systolic (congestive) heart failure: Secondary | ICD-10-CM

## 2023-10-19 DIAGNOSIS — I110 Hypertensive heart disease with heart failure: Secondary | ICD-10-CM | POA: Diagnosis not present

## 2023-10-19 DIAGNOSIS — N62 Hypertrophy of breast: Secondary | ICD-10-CM | POA: Diagnosis not present

## 2023-10-19 DIAGNOSIS — I5084 End stage heart failure: Secondary | ICD-10-CM | POA: Diagnosis not present

## 2023-10-19 DIAGNOSIS — I2694 Multiple subsegmental pulmonary emboli without acute cor pulmonale: Secondary | ICD-10-CM

## 2023-10-19 DIAGNOSIS — Z7984 Long term (current) use of oral hypoglycemic drugs: Secondary | ICD-10-CM | POA: Insufficient documentation

## 2023-10-19 DIAGNOSIS — J849 Interstitial pulmonary disease, unspecified: Secondary | ICD-10-CM

## 2023-10-19 DIAGNOSIS — J449 Chronic obstructive pulmonary disease, unspecified: Secondary | ICD-10-CM | POA: Diagnosis not present

## 2023-10-19 DIAGNOSIS — I2510 Atherosclerotic heart disease of native coronary artery without angina pectoris: Secondary | ICD-10-CM | POA: Diagnosis not present

## 2023-10-19 DIAGNOSIS — Z86711 Personal history of pulmonary embolism: Secondary | ICD-10-CM | POA: Diagnosis not present

## 2023-10-19 DIAGNOSIS — E119 Type 2 diabetes mellitus without complications: Secondary | ICD-10-CM | POA: Diagnosis not present

## 2023-10-19 DIAGNOSIS — Z7985 Long-term (current) use of injectable non-insulin antidiabetic drugs: Secondary | ICD-10-CM | POA: Insufficient documentation

## 2023-10-19 LAB — BRAIN NATRIURETIC PEPTIDE: B Natriuretic Peptide: 505 pg/mL — ABNORMAL HIGH (ref 0.0–100.0)

## 2023-10-19 LAB — BASIC METABOLIC PANEL WITH GFR
Anion gap: 9 (ref 5–15)
BUN: 21 mg/dL (ref 8–23)
CO2: 27 mmol/L (ref 22–32)
Calcium: 8.3 mg/dL — ABNORMAL LOW (ref 8.9–10.3)
Chloride: 101 mmol/L (ref 98–111)
Creatinine, Ser: 1.19 mg/dL (ref 0.61–1.24)
GFR, Estimated: >60 mL/min (ref >60)
Glucose, Bld: 162 mg/dL — ABNORMAL HIGH (ref 70–99)
Potassium: 4 mmol/L (ref 3.5–5.1)
Sodium: 137 mmol/L (ref 135–145)

## 2023-10-19 LAB — DIGOXIN LEVEL: Digoxin Level: 0.6 ng/mL — ABNORMAL LOW (ref 0.8–2.0)

## 2023-10-19 MED ORDER — DIGOXIN 125 MCG PO TABS: 125.0000 ug | ORAL_TABLET | Freq: Every day | ORAL | 1 refills | Status: DC

## 2023-10-19 MED ORDER — TORSEMIDE 20 MG PO TABS
40.0000 mg | ORAL_TABLET | Freq: Every day | ORAL | 3 refills | Status: DC
Start: 2023-10-19 — End: 2023-11-16

## 2023-10-19 MED ORDER — ENTRESTO 24-26 MG PO TABS: 1.0000 | ORAL_TABLET | Freq: Two times a day (BID) | ORAL | 5 refills | Status: DC

## 2023-10-19 NOTE — Progress Notes (Signed)
 Orders entered for RHC with Dr. Bruce Caper on 11/10/23

## 2023-10-19 NOTE — Patient Instructions (Signed)
 Medication Changes:  MEDICATIONS REFILLED   Lab Work:  Labs done today, your results will be available in MyChart, we will contact you for abnormal readings.  Testing/Procedures:   [facility] [address] [place] [address] Dept: [phone] Loc: [phone]  Phillip Rich  10/19/2023  You are scheduled for a Cardiac Catheterization on Tuesday, July 1 with Dr. Alwin Baars.  1. Please arrive at the [facility] (Main Entrance A) at [facility]: [address] [place], [place] [address] at 10:00 AM (This time is 2 hour(s) before your procedure to ensure your preparation).   Free valet parking service is available. You will check in at ADMITTING. The support person will be asked to wait in the waiting room.  It is OK to have someone drop you off and come back when you are ready to be discharged.    Special note: Every effort is made to have your procedure done on time. Please understand that emergencies sometimes delay scheduled procedures.  2. Diet: Do not eat solid foods after midnight.  The patient may have clear liquids until 5am upon the day of the procedure.  3. Labs: You will need to have blood drawn on TODAY  4. Medication instructions in preparation for your procedure:  DO NOT TAKE TORSEMIDE  THE MORNING OF PROCEDURE   DO NOT TAKE EPLERENONE  THE MORNING OF PROCEDURE   DO NOT TAKE JARDIANCE  THE MORNING OF PROCEDURE   DO NOT TAKE METFORMIN THE MORNING OF PROCEDURE   Do not take Diabetes Med Glucophage (Metformin) on the day of the procedure and HOLD 48 HOURS AFTER THE PROCEDURE.  5. Plan to go home the same day, you will only stay overnight if medically necessary. 6. Bring a current list of your medications and current insurance cards. 7. You MUST have a responsible person to drive you home. 8. Someone MUST be with you the first 24 hours after you arrive home or your  discharge will be delayed. 9. Please wear clothes that are easy to get on and off and wear slip-on shoes.  Thank you for allowing us  to care for you!   -- [place]  Follow-Up in: 1 MONTH AS SCHEDULED   At the [facility], you and your health needs are our priority. We have a designated team specialized in the treatment of Heart Failure. This Care Team includes your primary Heart Failure Specialized Cardiologist (physician), Advanced Practice Providers (APPs- Physician Assistants and Nurse Practitioners), and Pharmacist who all work together to provide you with the care you need, when you need it.   You may see any of the following providers on your designated Care Team at your next follow up:  Dr. Jules Oar Dr. Peder Bourdon Dr. Alwin Baars Dr. Judyth Nunnery Nieves Bars, NP Ruddy Corral, [place] [facility], [place] Dennise Fitz, NP [place] Lee, NP Luster Salters, PharmD   Please be sure to bring in all your medications bottles to every appointment.   Need to Contact Us :  If you have any questions or concerns before your next appointment please send us  a message through [place] or call our office at [phone].    TO LEAVE A MESSAGE FOR THE NURSE SELECT OPTION 2, PLEASE LEAVE A MESSAGE INCLUDING: YOUR NAME DATE OF BIRTH CALL BACK NUMBER REASON FOR CALL**this is important as we prioritize the call backs  YOU WILL RECEIVE A CALL  BACK THE SAME DAY AS LONG AS YOU CALL BEFORE 4:00 PM

## 2023-10-20 ENCOUNTER — Telehealth (HOSPITAL_COMMUNITY): Payer: Self-pay | Admitting: Licensed Clinical Social Worker

## 2023-10-20 NOTE — Telephone Encounter (Addendum)
 CSW received referral to contact patient to follow up on concerns regarding costs of milrinone  medication. Patient asked about a grant program to subsidize his co pays for the medication. CSW unaware of any grant programs for milrinone  but would inquire with the pharmacy department. CSW encouraged patient to contact the company and inquire about a charity care program to help off set his out of pocket expenses. Patient verbalizes understanding and will follow up. Aubry Blase, LCSW, CCSW-MCS [phone]

## 2023-10-23 ENCOUNTER — Other Ambulatory Visit (HOSPITAL_COMMUNITY): Payer: Self-pay | Admitting: Cardiology

## 2023-10-26 DIAGNOSIS — I5023 Acute on chronic systolic (congestive) heart failure: Secondary | ICD-10-CM | POA: Diagnosis not present

## 2023-10-26 DIAGNOSIS — I429 Cardiomyopathy, unspecified: Secondary | ICD-10-CM | POA: Diagnosis not present

## 2023-11-02 DIAGNOSIS — I429 Cardiomyopathy, unspecified: Secondary | ICD-10-CM | POA: Diagnosis not present

## 2023-11-02 DIAGNOSIS — I5023 Acute on chronic systolic (congestive) heart failure: Secondary | ICD-10-CM | POA: Diagnosis not present

## 2023-11-05 ENCOUNTER — Other Ambulatory Visit (HOSPITAL_COMMUNITY): Payer: Self-pay | Admitting: Cardiology

## 2023-11-05 ENCOUNTER — Ambulatory Visit: Admitting: Internal Medicine

## 2023-11-05 ENCOUNTER — Encounter: Payer: Self-pay | Admitting: Internal Medicine

## 2023-11-05 VITALS — BP 106/62 | HR 108 | Ht 70.0 in | Wt 167.0 lb

## 2023-11-05 DIAGNOSIS — J849 Interstitial pulmonary disease, unspecified: Secondary | ICD-10-CM

## 2023-11-05 DIAGNOSIS — Z86711 Personal history of pulmonary embolism: Secondary | ICD-10-CM

## 2023-11-05 DIAGNOSIS — R053 Chronic cough: Secondary | ICD-10-CM

## 2023-11-05 DIAGNOSIS — Z9109 Other allergy status, other than to drugs and biological substances: Secondary | ICD-10-CM

## 2023-11-05 LAB — PULMONARY FUNCTION TEST
DL/VA % pred: 59 %
DL/VA: 2.36 ml/min/mmHg/L
DLCO unc % pred: 45 %
DLCO unc: 11.53 ml/min/mmHg
FEF 25-75 Pre: 2.46 L/s
FEF2575-%Pred-Pre: 105 %
FEV1-%Pred-Pre: 94 %
FEV1-Pre: 2.96 L
FEV1FVC-%Pred-Pre: 104 %
FEV6-%Pred-Pre: 94 %
FEV6-Pre: 3.84 L
FEV6FVC-%Pred-Pre: 105 %
FVC-%Pred-Pre: 90 %
FVC-Pre: 3.88 L
Pre FEV1/FVC ratio: 76 %
Pre FEV6/FVC Ratio: 99 %

## 2023-11-05 LAB — D-DIMER, QUANTITATIVE: D-Dimer, Quant: 0.84 ug{FEU}/mL — ABNORMAL HIGH (ref <0.50)

## 2023-11-05 NOTE — Progress Notes (Signed)
 Patient ID: Phillip Rich, male    DOB: 03/24/1951, 73 y.o.   MRN: [id number]  HPI  Problem list 1. Dyspnea  - multifactorial due to obesity, new dx of chronic ILD, and acute worsening June 2012 due to narcs/benzo in setting post op knee replacment. Did not desaturate 185 feet x 3 laps mid June 2012 2. Chronic cough - since feb 2012. Multifactorial - ACe inhibitor, GERD, ILD 3. Mediastinal node - 2.8cm R Hilar mass n CT 10/15/2010 4. ILD - noted during dyspnea eval in June 2012 5. Tobacco abuse  OV 12/10/2010: Followup for all of above. Dyspnea is much improved after he stopped narcs/benzo. In fact, resolved. Cough still present though possibly better - ? Still on ace inhibitor. GERD is active per patient and diet not great. STill smoking too; knows he has to quit but addicted and struggles. Prior has quit but relapsed due to significant other. CT chest (reviewed) followup now shows resolution of hilar mass but persistence of GGO without honeycombing. PFTs, correlate with pulmonary infiltrates with isolated low DLCO 54% (otherwise normal). He has lot of questions about ILD  Past, Family, Social reviewed: no changes noted and documented below      OV 06/04/2023  Subjective:  Patient ID: Phillip Rich, male , DOB: 09-Dec-1950 , age 48 y.o. , MRN: [id number] , ADDRESS: [address] [place] [address] PCP Charlott Dorn LABOR, MD Patient Care Team: Charlott Dorn LABOR, MD as PCP - General (Internal Medicine) Lavona Agent, MD as PCP - Cardiology (Cardiology)  This Provider for this visit: Treatment Team:  Attending Provider: Geronimo Amel, MD    06/04/2023 -   Chief Complaint  Patient presents with   Hospitalization Follow-up    Hospital f/u 01/27/23.  F/u ILD CT 9/18  Pt denies any concerns, has albuterol  for prn but does not use it. Using      HPI Phillip Rich 73 y.o. -returns for follow-up.  I personally saw him in 2012 for chronic cough and early  ILD.  Then after that he was lost to follow-up I saw him in the hospital September 2024 for CHF and elevated evaluation.  Since then he has not undergone LVAD and decided against it because of lifestyle issues [he lives alone] and also relative contraindication given his comorbidities.  He is now stable on milrinone  infusion with the PICC line.  CCM therapy is being considered based on external record review of heart failure that he just visited.  He feels stable.  He gets short of breath sometimes with changing clothes but definitely with heavy exertion.  No orthopnea no paroxysmal nocturnal dyspnea no edema weight is stable.  He is reporting chronic cough with yellow sputum.  Recent blood eosinophil is elevated.  Not on asthma therapy but he says his mom has asthma.  He is agreeable to get blood allergy workup done.  He is also willing to try an inhaled steroid.  I will hold off on long-acting beta agonist for him given his cardiac issues.  For his ILD I noted that he is not had c high-res CT in over a year.  OV 11/05/2023  Subjective:  Patient ID: Phillip Rich, male , DOB: 03-13-1951 , age 41 y.o. , MRN: [id number] , ADDRESS: [address] [place] [address] PCP Charlott Dorn LABOR, MD Patient Care Team: Charlott Dorn LABOR, MD as PCP - General (Internal Medicine) Lavona Agent, MD as PCP - Cardiology (Cardiology) Cindie Smalls  T, MD as PCP - Electrophysiology (Cardiology)  This Provider for this visit: Treatment Team:  Attending Provider: Geronimo Amel, MD    11/05/2023 -   Chief Complaint  Patient presents with   Follow-up    PFT done today. Breathing is unchanged.  He has occ cough with clear to white sputum.    Chronic heart failure patient with ILD.  He is on milrinone .  HPI Phillip Rich 73 y.o. -presents for follow-up.  He states overall he feels stable but he says that he loses energy when he walks.  He is planning to go to [place] .  He says he saw  cardiology CHF specialist Dr. Nance.  External records reviewed 10/19/2023 confirms the same.  Looks like patient's milrinone  was decreased to the lowest dose in March 2025.  Since then he has had decrease in effort tolerance.  Review of the records indicate as of this point he is still not a LVAD candidate.  He was also denied for CCM.  He does not want to pursue LVAD anymore repeat right heart catheterization is being considered.  However despite all this no hospitalizations no ER visits no surgeries.  Repeat right heart catheter on November 10, 2023.  He did have high-resolution CT chest that radiology feels slightly worse compared to 2024 but in my personal visualization opinion this might be slightly better than 2023  Pulmonary function test definitely better than 9 months ago.  Exercise hypoxemia test is normal with adequate tachycardic response  Review of the labs from last visit show with the allergy workup he did have a positive dust mite allergy test.  With his pulmonary embolism history his anticoagulation is still on hold.  Will stop by cardiology.  Earlier this year was lower but still elevated.   SYMPTOM SCALE - ILD 11/05/2023  Current weight   O2 use ra  Shortness of Breath 0 -> 5 scale with 5 being worst (score 6 If unable to do)  At rest 0  Simple tasks - showers, clothes change, eating, shaving 2  Household (dishes, doing bed, laundry) 0  Shopping 0  Walking level at own pace 5  Walking up Stairs 7  Total (30-36) Dyspnea Score 2  How bad is your cough? 3  How bad is your fatigue 0  How bad is nausea 0  How bad is vomiting?  00  How bad is diarrhea? 0  How bad is anxiety? 2  How bad is depression 0  Any chronic pain - if so where and how bad 0         SIT STAND TEST - goal 15 times   11/05/2023    O2 used ra   PRobe - finter or forehead finager   Number sit and stand completed - goal 15 56 sec   Time taken to complete 15 times   Resting Pulse Ox/HR/Dyspnea   95% and 108/min and dyspnea of 1/10    Peak measures 94% and 125/min and dyspnea of 1/10   Final Pulse Ox/HR 94% and 120/min and dyspnea of 1/10   Desaturated </= 88% no   Desaturated <= 3% points no   Got Tachycardic >/= 90/min yes   Miscellaneous comments ok      CT Chest data from date: *  - personally visualized and independently interpreted : yes - my findings are: ? Better than 2023  IMPRESSION: entrilobular and paraseptal emphysema. Peripheral and basilar predominant traction bronchiectasis/bronchiolectasis and coarsened ground-glass. Subpleural reticulation is present but  not an overwhelming feature. No definitive honeycombing. Findings are unchanged in the short interval from 01/28/2023 but appear mildly progressive from 05/27/2019. New areas of nodular ground-glass, nodular consolidation or pulmonary nodules in the lungs bilaterally, measuring 5 mm or less in size. No pleural fluid. Minimal debris in the airway. Expiratory phase imaging was not performed in true expiration, limiting the evaluation for air trapping 1. Pulmonary parenchymal pattern of fibrosis, as detailed above, minimally progressive from 01/28/2023. Findings may be due to fibrotic hypersensitivity pneumonitis or fibrotic nonspecific interstitial pneumonitis. Findings are indeterminate for UIP per consensus guidelines: Diagnosis of Idiopathic Pulmonary Fibrosis: An Official ATS/ERS/JRS/ALAT Clinical Practice Guideline. Am JINNY [employer] Crit Care Med Vol 198, Iss 5, [phone], Jan 10 2017. 2. New areas of nodular ground-glass, nodular consolidation and pulmonary nodules possibly infectious/inflammatory in etiology. Consider follow-up CT chest without contrast in 3 months from today's study date to ensure resolution. These results will be called to the ordering clinician or representative by the Radiologist Assistant, and communication documented in the PACS or [employer]. 3. Aortic atherosclerosis  (ICD10-I70.0). Coronary artery calcification. 4. Enlarged pulmonic trunk, indicative of pulmonary arterial hypertension. 5. Emphysema (ICD10-J43.9). 6. Low-dose CT lung cancer screening is recommended for patients who are 87-43 years of age with a 20+ pack-year history of smoking and who are currently smoking or quit <=15 years ago.     Electronically Signed   By: Newell Eke M.D.   On: 09/04/2023 11:35    PFT     Latest Ref Rng & Units 11/05/2023    2:21 PM 01/29/2023   12:01 PM  PFT Results  FVC-Pre L 3.88  P 3.49   FVC-Predicted Pre % 90  P 80   FVC-Post L  3.46   FVC-Predicted Post %  79   Pre FEV1/FVC % % 76  P 78   Post FEV1/FCV % %  50   FEV1-Pre L 2.96  P 2.71   FEV1-Predicted Pre % 94  P 85   FEV1-Post L  1.72   DLCO uncorrected ml/min/mmHg 11.53  P 8.58   DLCO UNC% % 45  P 33   DLVA Predicted % 59  P 45   TLC L  5.19   TLC % Predicted %  73   RV % Predicted %  66     P Preliminary result      Latest Reference Range & Units 06/04/23 14:10  Class Description Allergens  Comment  D Pteronyssinus IgE Class 0/I kU/L 0.12 !  D Farinae IgE Class 0/I kU/L 0.10 !  Cat Dander IgE Class 0 kU/L <0.10  Dog Dander IgE Class 0 kU/L <0.10  Penicillium Chrysogen IgE Class 0 kU/L <0.10  Cladosporium Herbarum IgE Class 0 kU/L <0.10  Aspergillus Fumigatus IgE Class 0 kU/L <0.10  Mucor Racemosus IgE Class 0 kU/L <0.10  Alternaria Alternata IgE Class 0 kU/L <0.10  Stemphylium Herbarum IgE Class 0 kU/L <0.10  Goose Feathers IgE Class 0 kU/L <0.10  Chicken Feathers IgE Class 0 kU/L <0.10  Duck Feathers IgE Class 0 kU/L <0.10  Mouse Urine IgE Class 0 kU/L <0.10  !: Data is abnormal  LAB RESULTS last 96 hours No results found.       has a past medical history of Anxiety, BPH (benign prostatic hyperplasia), Complication of anesthesia, Diabetes mellitus, DJD (degenerative joint disease), GERD (gastroesophageal reflux disease), HTN (hypertension), Hyperlipidemia, ILD  (interstitial lung disease) (HCC), Left renal mass, Melanoma (HCC), Mild sleep apnea, and PONV (postoperative  nausea and vomiting).   reports that he quit smoking about 2 years ago. His smoking use included cigarettes. He started smoking about 52 years ago. He has a 50 pack-year smoking history. He has never used smokeless tobacco.  Past Surgical History:  Procedure Laterality Date   COLONOSCOPY WITH PROPOFOL  N/A 04/03/2014   Procedure: COLONOSCOPY WITH PROPOFOL ;  Surgeon: Gladis MARLA Louder, MD;  Location: [facility];  Service: Endoscopy;  Laterality: N/A;   IR FLUORO GUIDE CV LINE RIGHT  02/04/2023   IR US  GUIDE VASC ACCESS RIGHT  02/04/2023   KNEE ARTHROSCOPY Right    x2 right, x5 left   LASIK     MELANOMA EXCISION Left    NEPHRECTOMY Left 2022   partial   PROSTATE ABLATION     RIGHT HEART CATH N/A 08/05/2022   Procedure: RIGHT HEART CATH;  Surgeon: Gardenia Led, DO;  Location: [facility] INVASIVE CV LAB;  Service: Cardiovascular;  Laterality: N/A;   RIGHT HEART CATH N/A 01/28/2023   Procedure: RIGHT HEART CATH;  Surgeon: Rolan Ezra RAMAN, MD;  Location: [facility] INVASIVE CV LAB;  Service: Cardiovascular;  Laterality: N/A;   RIGHT/LEFT HEART CATH AND CORONARY ANGIOGRAPHY N/A 01/15/2022   Procedure: RIGHT/LEFT HEART CATH AND CORONARY ANGIOGRAPHY;  Surgeon: Darron Deatrice LABOR, MD;  Location: [facility] INVASIVE CV LAB;  Service: Cardiovascular;  Laterality: N/A;   TOTAL KNEE ARTHROPLASTY     left   VASECTOMY      Allergies  Allergen Reactions   Codeine Nausea And Vomiting   Crestor [Rosuvastatin Calcium ] Other (See Comments)    Leg aches   Erythromycin Rash    All Mycin drugs    Immunization History  Administered Date(s) Administered   Fluad Quad(high Dose 65+) 01/20/2022   Influenza, High Dose Seasonal PF 01/15/2016, 02/20/2017, 01/29/2018, 02/16/2023   Influenza,inj,Quad PF,6+ Mos 01/15/2019   PFIZER(Purple Top)SARS-COV-2 Vaccination 07/08/2019, 08/05/2019, 05/07/2020   PNEUMOCOCCAL  CONJUGATE-20 08/22/2022   Pneumococcal Conjugate-13 08/28/2015   Pneumococcal Polysaccharide-23 04/17/2006    Family History  Problem Relation Age of Onset   Lung cancer Mother    Heart disease Father    Breast cancer Sister 74 - 64   Asthma Sister    Asthma Sister    Colon cancer Sister    Rheum arthritis Sister        x3     Current Outpatient Medications:    albuterol  (VENTOLIN  HFA) 108 (90 Base) MCG/ACT inhaler, Inhale 1-2 puffs into the lungs every 6 (six) hours as needed for wheezing or shortness of breath., Disp: 1 each, Rfl: 0   atorvastatin  (LIPITOR ) 80 MG tablet, Take 1 tablet (80 mg total) by mouth daily., Disp: 90 tablet, Rfl: 3   Blood Glucose Monitoring Suppl (ONETOUCH VERIO FLEX SYSTEM) w/Device KIT, Use to check blood sugar for 365 days twice a day DX: E11.65, Disp: , Rfl:    Cholecalciferol  (VITAMIN D3 MAXIMUM STRENGTH) 125 MCG (5000 UT) capsule, Take 5,000 Units by mouth daily., Disp: , Rfl:    dextromethorphan  (DELSYM ) 30 MG/5ML liquid, Take 60 mg by mouth as needed for cough., Disp: , Rfl:    digoxin  (LANOXIN ) 0.125 MG tablet, Take 1 tablet (125 mcg total) by mouth daily., Disp: 90 tablet, Rfl: 1   DULoxetine  (CYMBALTA ) 60 MG capsule, Take 60 mg by mouth daily., Disp: , Rfl:    empagliflozin  (JARDIANCE ) 10 MG TABS tablet, Take 1 tablet (10 mg total) by mouth daily before breakfast., Disp: 90 tablet, Rfl: 3   eplerenone  (INSPRA ) 25 MG  tablet, Take 0.5 tablets (12.5 mg total) by mouth daily., Disp: 45 tablet, Rfl: 3   Lancets (ONETOUCH DELICA PLUS LANCET33G) MISC, Apply topically 2 (two) times daily., Disp: , Rfl:    levocetirizine (XYZAL) 5 MG tablet, Take 5 mg by mouth daily., Disp: , Rfl:    Melatonin 10 MG TABS, Take 10 mg by mouth at bedtime as needed (Sleep)., Disp: , Rfl:    metFORMIN (GLUCOPHAGE) 1000 MG tablet, Take 1,000 mg by mouth 2 (two) times daily. , Disp: , Rfl:    milrinone  (PRIMACOR ) 20 MG/100 ML SOLN infusion, Inject 0.0098 mg/min into the vein  continuous., Disp: , Rfl:    naproxen sodium (ALEVE) 220 MG tablet, Take 440 mg by mouth daily as needed (pain)., Disp: , Rfl:    omeprazole (PRILOSEC) 20 MG capsule, Take 1 tablet by mouth every morning. , Disp: , Rfl:    ONETOUCH VERIO test strip, Check blood sugar In Vitro twice a day DX: E11.65 for 100 days, Disp: , Rfl:    sacubitril -valsartan  (ENTRESTO ) 24-26 MG, Take 1 tablet by mouth 2 (two) times daily. 1 tablet in the am and 1/2 tablet in the evening (Patient taking differently: Take 0.5-1 tablets by mouth See admin instructions. 1 tablet in the am and 1/2 tablet in the evening), Disp: 60 tablet, Rfl: 5   Semaglutide,0.25 or 0.5MG /DOS, (OZEMPIC, 0.25 OR 0.5 MG/DOSE,) 2 MG/3ML SOPN, Inject 0.5 mg into the skin once a week., Disp: , Rfl:    torsemide  (DEMADEX ) 20 MG tablet, Take 2 tablets (40 mg total) by mouth daily., Disp: 60 tablet, Rfl: 3   vitamin B-12 (CYANOCOBALAMIN) 500 MCG tablet, Take 500 mcg by mouth daily., Disp: , Rfl:    zolpidem  (AMBIEN ) 10 MG tablet, Take 10 mg by mouth at bedtime as needed for sleep., Disp: , Rfl:  No current facility-administered medications for this visit.  Facility-Administered Medications Ordered in Other Visits:    alteplase  (CATHFLO ACTIVASE ) injection 2 mg, 2 mg, Intracatheter, Once, Sabharwal, [name], DO      Objective:   Vitals:   11/05/23 1458  BP: 106/62  Pulse: (!) 108  SpO2: 95%  Weight: 167 lb (75.8 kg)  Height: 5' 10 (1.778 m)    Estimated body mass index is 23.96 kg/m as calculated from the following:   Height as of this encounter: 5' 10 (1.778 m).   Weight as of this encounter: 167 lb (75.8 kg).  @WEIGHTCHANGE @  [employer]   11/05/23 1458  Weight: 167 lb (75.8 kg)     Physical Exam   General: No distress. Looks well. MILRINONE  PUMP + O2 at rest: no Cane present: no Sitting in wheel chair: no Frail: no Obese: non Neuro: Alert and Oriented x 3. GCS 15. Speech normal Psych: Pleasant Resp:  Barrel Chest -  on.  Wheeze - o, Crackles - YES BASE, No overt respiratory distress CVS: Normal heart sounds. Murmurs - no Ext: Stigmata of Connective Tissue Disease - no HEENT: Normal upper airway. PEERL +. No post nasal drip        Assessment:       ICD-10-CM   1. ILD (interstitial lung disease) (HCC)  J84.9 D-Dimer, Quantitative    CT Chest Wo Contrast    Pulmonary function test    D-Dimer, Quantitative    2. House dust mite allergy  Z91.09 D-Dimer, Quantitative    CT Chest Wo Contrast    Pulmonary function test    D-Dimer, Quantitative    3. History  of pulmonary embolism  Z86.711 D-Dimer, Quantitative    CT Chest Wo Contrast    Pulmonary function test    D-Dimer, Quantitative         Plan:     Patient Instructions  ILD (interstitial lung disease) (HCC)  - clinically stable  - PFT actually better since 2024 -> June 2025 - To DR Pihu Basil CT looks better 2023 -> April 2025 but overal progressive since 2021  Plan  - given advanced CHF recommend medicines (anti fibrotics) ONLY if there is FURTHER progression  - do spiro and dlco in 6 months  Chronic cough  - improved but still + and mild - DUST MITE allergy +  Plan  - control Dust Mite allergy (see below)   History of pulmonary embolism summer 2023  Seems off DOAC per cards- D-dimer slight high in Jan 2025  Plan  -red check d-dimer to asess recurrence risk  Follwuop 3 months  with APP afer CT for nodule 6 months with Casimer Russett afte PFT  Xxxx   Google Dust Mite Allergy and get info from Asthma and Allergy [employer] and pillows in zippered dust-proof covers. These covers are made of a material with pores too small to let dust mites and their waste product through. They are also called allergen-impermeable. Plastic or vinyl covers are the least expensive, but some people find them uncomfortable. You can buy other fabric allergen-impermeable covers from many regular bedding  stores.   Wash your sheets and blankets weekly in hot water. You have to wash them in water that's at least 130 F or more to kill dust mites.  Get rid of all types of fabric that mites love and that you cannot easily wash regularly in hot water.   Avoid wall-to-wall carpeting, curtains, blinds, upholstered furniture and down-filled covers and pillows in the bedroom. Put roll-type shades on your windows instead of curtains.  Have someone without a dust mite allergy clean your bedroom. If this is not possible, wear a filtering mask when dusting or vacuuming. Many drug stores carry these items. Dusting and vacuuming stir up dust. So try to do these chores when you can stay out of the bedroom for a while afterward.   Special CERTIFIED filter vacuum cleaners can help to keep mites and mite waste from getting back into the air. CERTIFIED vacuums are scientifically tested and verified as more suitable for making your home healthier.  Vacuuming is not enough to remove all dust mites and their waste. A large amount of the dust mite population may remain because they live deep inside the stuffing of sofas, chairs, mattresses, pillows, and carpeting.  Treat other rooms in your house like your bedroom. Here are more tips:  Avoid wall-to-wall carpeting, if possible. If you do use carpeting, mites don't like the type with a short, tight pile as much. Use washable throw rugs over regularly damp-mopped wood, linoleum or tiled floors. Wash rugs in hot water whenever possible. Cold water isn't as effective. Dry cleaning kills all dust mites and is also good for removing dust mites from living in fabrics. Keep the humidity in your home less than 50%. Use a dehumidifier and/or air conditioner to do this. Use a CERTIFIED filter with your central furnace and air conditioning unit. This can help trap dust mites from your entire home. Freestanding air cleaners only filter air in a limited area. Avoid devices that treat  air with heat, electrostatic ions, or ozon  FOLLOWUP Return for 3 monthis wth APP after CT and 6 months 15 min wiht Dr Geronimo after PFT.    SIGNATURE    Dr. Dorethia Geronimo, M.D., F.C.C.P,  Pulmonary and Critical Care Medicine Staff Physician, [facility] Center Director - Interstitial Lung Disease  Program  Pulmonary Fibrosis [facility] at [facility] [place], [place], [address]  Pager: [phone], If no answer or between  15:00h - 7:00h: call [phone] Telephone: [phone]  5:25 PM 11/05/2023

## 2023-11-05 NOTE — Patient Instructions (Signed)
Spiro/DLCO performed today. 

## 2023-11-05 NOTE — Progress Notes (Signed)
Spiro/DLCO performed today. 

## 2023-11-05 NOTE — Patient Instructions (Addendum)
 ILD (interstitial lung disease) (HCC)  - clinically stable  - PFT actually better since 2024 -> June 2025 - To DR Latrell Reitan CT looks better 2023 -> April 2025 but overal progressive since 2021  Plan  - given advanced CHF recommend medicines (anti fibrotics) ONLY if there is FURTHER progression  - do spiro and dlco in 6 months  Chronic cough  - improved but still + and mild - DUST MITE allergy +  Plan  - control Dust Mite allergy (see below)   History of pulmonary embolism summer 2023  Seems off DOAC per cards- D-dimer slight high in Jan 2025  Plan  -red check d-dimer to asess recurrence risk  Follwuop 3 months  with APP afer CT for nodule 6 months with Emmaleigh Longo afte PFT  Xxxx   Google Dust Mite Allergy and get info from Asthma and Allergy [employer] and pillows in zippered dust-proof covers. These covers are made of a material with pores too small to let dust mites and their waste product through. They are also called allergen-impermeable. Plastic or vinyl covers are the least expensive, but some people find them uncomfortable. You can buy other fabric allergen-impermeable covers from many regular bedding stores.   Wash your sheets and blankets weekly in hot water. You have to wash them in water that's at least 130 F or more to kill dust mites.  Get rid of all types of fabric that mites love and that you cannot easily wash regularly in hot water.   Avoid wall-to-wall carpeting, curtains, blinds, upholstered furniture and down-filled covers and pillows in the bedroom. Put roll-type shades on your windows instead of curtains.  Have someone without a dust mite allergy clean your bedroom. If this is not possible, wear a filtering mask when dusting or vacuuming. Many drug stores carry these items. Dusting and vacuuming stir up dust. So try to do these chores when you can stay out of the bedroom for a while afterward.   Special CERTIFIED filter  vacuum cleaners can help to keep mites and mite waste from getting back into the air. CERTIFIED vacuums are scientifically tested and verified as more suitable for making your home healthier.  Vacuuming is not enough to remove all dust mites and their waste. A large amount of the dust mite population may remain because they live deep inside the stuffing of sofas, chairs, mattresses, pillows, and carpeting.  Treat other rooms in your house like your bedroom. Here are more tips:  Avoid wall-to-wall carpeting, if possible. If you do use carpeting, mites don't like the type with a short, tight pile as much. Use washable throw rugs over regularly damp-mopped wood, linoleum or tiled floors. Wash rugs in hot water whenever possible. Cold water isn't as effective. Dry cleaning kills all dust mites and is also good for removing dust mites from living in fabrics. Keep the humidity in your home less than 50%. Use a dehumidifier and/or air conditioner to do this. Use a CERTIFIED filter with your central furnace and air conditioning unit. This can help trap dust mites from your entire home. Freestanding air cleaners only filter air in a limited area. Avoid devices that treat air with heat, electrostatic ions, or ozon

## 2023-11-07 ENCOUNTER — Ambulatory Visit: Payer: Self-pay | Admitting: Internal Medicine

## 2023-11-07 NOTE — Telephone Encounter (Signed)
 Hi [name]   Phillip Rich  = He told me you  stopped his Eliquis  but his D-dimer continues to be high.  If there are no contraindications he might benefit from low-dose Eliquis  2.5 mg twice daily as prophylaxis for another year

## 2023-11-09 DIAGNOSIS — I429 Cardiomyopathy, unspecified: Secondary | ICD-10-CM | POA: Diagnosis not present

## 2023-11-09 DIAGNOSIS — I5023 Acute on chronic systolic (congestive) heart failure: Secondary | ICD-10-CM | POA: Diagnosis not present

## 2023-11-10 ENCOUNTER — Encounter (HOSPITAL_COMMUNITY)
Admission: RE | Disposition: A | Discharge status: Home / Self Care | Payer: Self-pay | Source: Ambulatory Visit | Attending: Cardiology

## 2023-11-10 ENCOUNTER — Telehealth (HOSPITAL_COMMUNITY): Payer: Self-pay

## 2023-11-10 ENCOUNTER — Other Ambulatory Visit: Payer: Self-pay

## 2023-11-10 ENCOUNTER — Encounter (HOSPITAL_COMMUNITY): Payer: Self-pay | Admitting: Cardiology

## 2023-11-10 ENCOUNTER — Ambulatory Visit (HOSPITAL_COMMUNITY)
Admission: RE | Admit: 2023-11-10 | Discharge: 2023-11-10 | Disposition: A | Discharge status: Home / Self Care | Source: Ambulatory Visit | Attending: Cardiology | Admitting: Cardiology

## 2023-11-10 DIAGNOSIS — Z7984 Long term (current) use of oral hypoglycemic drugs: Secondary | ICD-10-CM | POA: Diagnosis not present

## 2023-11-10 DIAGNOSIS — I5084 End stage heart failure: Secondary | ICD-10-CM | POA: Diagnosis not present

## 2023-11-10 DIAGNOSIS — Z86711 Personal history of pulmonary embolism: Secondary | ICD-10-CM | POA: Diagnosis not present

## 2023-11-10 DIAGNOSIS — I5022 Chronic systolic (congestive) heart failure: Secondary | ICD-10-CM | POA: Insufficient documentation

## 2023-11-10 DIAGNOSIS — I509 Heart failure, unspecified: Secondary | ICD-10-CM | POA: Diagnosis not present

## 2023-11-10 DIAGNOSIS — F17200 Nicotine dependence, unspecified, uncomplicated: Secondary | ICD-10-CM | POA: Insufficient documentation

## 2023-11-10 DIAGNOSIS — E119 Type 2 diabetes mellitus without complications: Secondary | ICD-10-CM | POA: Diagnosis not present

## 2023-11-10 DIAGNOSIS — I110 Hypertensive heart disease with heart failure: Secondary | ICD-10-CM | POA: Insufficient documentation

## 2023-11-10 DIAGNOSIS — J449 Chronic obstructive pulmonary disease, unspecified: Secondary | ICD-10-CM | POA: Diagnosis not present

## 2023-11-10 DIAGNOSIS — I2510 Atherosclerotic heart disease of native coronary artery without angina pectoris: Secondary | ICD-10-CM | POA: Diagnosis not present

## 2023-11-10 DIAGNOSIS — Z7985 Long-term (current) use of injectable non-insulin antidiabetic drugs: Secondary | ICD-10-CM | POA: Diagnosis not present

## 2023-11-10 DIAGNOSIS — I255 Ischemic cardiomyopathy: Secondary | ICD-10-CM | POA: Diagnosis not present

## 2023-11-10 DIAGNOSIS — Z79899 Other long term (current) drug therapy: Secondary | ICD-10-CM | POA: Insufficient documentation

## 2023-11-10 DIAGNOSIS — Z905 Acquired absence of kidney: Secondary | ICD-10-CM | POA: Insufficient documentation

## 2023-11-10 HISTORY — PX: RIGHT HEART CATH: CATH118263

## 2023-11-10 LAB — POCT I-STAT EG7
Acid-Base Excess: 0 mmol/L (ref 0.0–2.0)
Acid-base deficit: 1 mmol/L (ref 0.0–2.0)
Bicarbonate: 23.8 mmol/L (ref 20.0–28.0)
Bicarbonate: 24.9 mmol/L (ref 20.0–28.0)
Calcium, Ion: 1.16 mmol/L (ref 1.15–1.40)
Calcium, Ion: 1.2 mmol/L (ref 1.15–1.40)
HCT: 28 % — ABNORMAL LOW (ref 39.0–52.0)
HCT: 29 % — ABNORMAL LOW (ref 39.0–52.0)
Hemoglobin: 9.5 g/dL — ABNORMAL LOW (ref 13.0–17.0)
Hemoglobin: 9.9 g/dL — ABNORMAL LOW (ref 13.0–17.0)
O2 Saturation: 52 %
O2 Saturation: 56 %
Potassium: 3.5 mmol/L (ref 3.5–5.1)
Potassium: 3.6 mmol/L (ref 3.5–5.1)
Sodium: 139 mmol/L (ref 135–145)
Sodium: 139 mmol/L (ref 135–145)
TCO2: 25 mmol/L (ref 22–32)
TCO2: 26 mmol/L (ref 22–32)
pCO2, Ven: 37.5 mmHg — ABNORMAL LOW (ref 44–60)
pCO2, Ven: 38.4 mmHg — ABNORMAL LOW (ref 44–60)
pH, Ven: 7.411 (ref 7.25–7.43)
pH, Ven: 7.42 (ref 7.25–7.43)
pO2, Ven: 27 mmHg — CL (ref 32–45)
pO2, Ven: 29 mmHg — CL (ref 32–45)

## 2023-11-10 LAB — GLUCOSE, CAPILLARY
Glucose-Capillary: 120 mg/dL — ABNORMAL HIGH (ref 70–99)
Glucose-Capillary: 120 mg/dL — ABNORMAL HIGH (ref 70–99)

## 2023-11-10 SURGERY — RIGHT HEART CATH
Anesthesia: LOCAL

## 2023-11-10 MED ORDER — HEPARIN (PORCINE) IN NACL 1000-0.9 UT/500ML-% IV SOLN
INTRAVENOUS | Status: DC | PRN
  Administered 2023-11-10: 500 mL

## 2023-11-10 MED ORDER — SODIUM CHLORIDE 0.9 % IV SOLN: INTRAVENOUS | Status: DC

## 2023-11-10 MED ORDER — MILRINONE LACTATE IN DEXTROSE 20-5 MG/100ML-% IV SOLN: 0.2500 ug/kg/min | INTRAVENOUS | Status: DC

## 2023-11-10 MED ORDER — LIDOCAINE HCL (PF) 1 % IJ SOLN
INTRAMUSCULAR | Status: DC | PRN
  Administered 2023-11-10: 5 mL

## 2023-11-10 MED ORDER — LIDOCAINE HCL (PF) 1 % IJ SOLN
INTRAMUSCULAR | Status: AC
Start: 2023-11-10 — End: 2023-11-10
  Filled 2023-11-10: qty 30

## 2023-11-10 MED ORDER — LIDOCAINE HCL (PF) 1 % IJ SOLN
INTRAMUSCULAR | Status: AC
  Filled 2023-11-10: qty 30

## 2023-11-10 SURGICAL SUPPLY — 6 items
CATH SWAN GANZ 7F STRAIGHT (CATHETERS) IMPLANT
GLIDESHEATH SLENDER 7FR .021G (SHEATH) IMPLANT
GUIDEWIRE .025 260CM (WIRE) IMPLANT
PACK CARDIAC CATHETERIZATION (CUSTOM PROCEDURE TRAY) ×1 IMPLANT
TRANSDUCER W/STOPCOCK (MISCELLANEOUS) IMPLANT
TUBING ART PRESS 72 MALE/FEM (TUBING) IMPLANT

## 2023-11-10 NOTE — Interval H&P Note (Signed)
 History and Physical Interval Note:  11/10/2023 10:50 AM  Phillip Rich  has presented today for surgery, with the diagnosis of HEart failure.  The various methods of treatment have been discussed with the patient and family. After consideration of risks, benefits and other options for treatment, the patient has consented to  Procedure(s): RIGHT HEART CATH (N/A) as a surgical intervention.  The patient's history has been reviewed, patient examined, no change in status, stable for surgery.  I have reviewed the patient's chart and labs.  Questions were answered to the patient's satisfaction.     Maximos Zayas

## 2023-11-10 NOTE — Discharge Instructions (Signed)

## 2023-11-10 NOTE — Telephone Encounter (Signed)
 Per Dr. Gardenia Harari, I just did his RHC; cardiac index is 2.1-2.2. Can we please increase his milrinone  back to 0.61mcg    Message sent to Holley Herring to have this adjusted. Medication list updated in chart.

## 2023-11-11 DIAGNOSIS — I429 Cardiomyopathy, unspecified: Secondary | ICD-10-CM | POA: Diagnosis not present

## 2023-11-11 DIAGNOSIS — I5023 Acute on chronic systolic (congestive) heart failure: Secondary | ICD-10-CM | POA: Diagnosis not present

## 2023-11-15 NOTE — Progress Notes (Signed)
 ADVANCED HEART FAILURE CLINIC NOTE  Referring Physician: Charlott Dorn LABOR, *  Primary Care: Charlott Dorn LABOR, MD Primary Cardiologist: N/A  CC: End stage heart failure, Stage D on home palliative inotropes  HPI: Phillip Rich is a 73 y.o. male with T2DM, HTN, renal mass s/p nephrectomy, COPD that presented to the [facility] ER w/ acute onset SOB on 08/27 when he was diagnosed with acute PE and HFrEF w/ an LVEF of 25%-30%; during admission, coronary angiography was significant for CTO of the RCA and diffusely diseased Lcx. RHC w/ elevated pre and post capillary filling pressures. He was diuresed and discharged home and started on some GDMT. Since that time, Phillip Rich has had a CMR demonstrating persistently reduced LV function (EF 20%) and CPX with severe cardiac limitations.   He was directly admitted for low output heart failure in late 2024.  During that time right heart catheterization consistent with severely reduced cardiac index.  He was started on IV inotropes and underwent LVAD evaluation.  Unfortunately LVAD evaluation was placed on hold after CT chest demonstrated severe lung disease with severely reduced DLCO.  He was ultimately discharged home on IV milrinone .  Since initiation of milrinone  he has had significant improvement in quality of life.   CT chest- with some new nodules. He will need another scan in 3 months.   Interval hx:  - Repeat RHC on 71/25 on milrinone  0.184mcg/kg/minw ith cardiac index of 2-2.2L/min/m2; due to symptoms of fatigue and dyspnea dose increased to 0.25mcg/kg/min.  - [place] is going to [place] on [place] for a week.  - Feels slightly better after increasing milrinone  to 0.25mcg/kg/min.    Current Outpatient Medications  Medication Sig Dispense Refill   albuterol  (VENTOLIN  HFA) 108 (90 Base) MCG/ACT inhaler Inhale 1-2 puffs into the lungs every 6 (six) hours as needed for wheezing or shortness of breath. 1 each 0   atorvastatin   (LIPITOR ) 80 MG tablet Take 1 tablet (80 mg total) by mouth daily. 90 tablet 3   Blood Glucose Monitoring Suppl (ONETOUCH VERIO FLEX SYSTEM) w/Device KIT Use to check blood sugar for 365 days twice a day DX: E11.65     Cholecalciferol  (VITAMIN D3 MAXIMUM STRENGTH) 125 MCG (5000 UT) capsule Take 5,000 Units by mouth daily.     dextromethorphan  (DELSYM ) 30 MG/5ML liquid Take 60 mg by mouth as needed for cough.     digoxin  (LANOXIN ) 0.125 MG tablet Take 1 tablet (125 mcg total) by mouth daily. 90 tablet 1   DULoxetine  (CYMBALTA ) 60 MG capsule Take 60 mg by mouth daily.     empagliflozin  (JARDIANCE ) 10 MG TABS tablet Take 1 tablet (10 mg total) by mouth daily before breakfast. 90 tablet 3   eplerenone  (INSPRA ) 25 MG tablet Take 0.5 tablets (12.5 mg total) by mouth daily. 45 tablet 3   Lancets (ONETOUCH DELICA PLUS LANCET33G) MISC Apply topically 2 (two) times daily.     levocetirizine (XYZAL) 5 MG tablet Take 5 mg by mouth daily.     Melatonin 10 MG TABS Take 10 mg by mouth at bedtime as needed (Sleep).     metFORMIN (GLUCOPHAGE) 1000 MG tablet Take 1,000 mg by mouth 2 (two) times daily.      milrinone  (PRIMACOR ) 20 MG/100 ML SOLN infusion Inject 0.0182 mg/min into the vein continuous.     naproxen sodium (ALEVE) 220 MG tablet Take 440 mg by mouth daily as needed (pain).     omeprazole (PRILOSEC) 20 MG capsule  Take 1 tablet by mouth every morning.      ONETOUCH VERIO test strip Check blood sugar In Vitro twice a day DX: E11.65 for 100 days     sacubitril -valsartan  (ENTRESTO ) 24-26 MG Take 1 tablet by mouth 2 (two) times daily. 1 tablet in the am and 1/2 tablet in the evening 60 tablet 5   Semaglutide,0.25 or 0.5MG /DOS, (OZEMPIC, 0.25 OR 0.5 MG/DOSE,) 2 MG/3ML SOPN Inject 0.5 mg into the skin once a week.     torsemide  (DEMADEX ) 20 MG tablet Take 20 mg by mouth daily.     vitamin B-12 (CYANOCOBALAMIN) 500 MCG tablet Take 500 mcg by mouth daily.     zolpidem  (AMBIEN ) 10 MG tablet Take 10 mg by mouth  at bedtime as needed for sleep.     No current facility-administered medications for this encounter.   Facility-Administered Medications Ordered in Other Encounters  Medication Dose Route Frequency Provider Last Rate Last Admin   alteplase  (CATHFLO ACTIVASE ) injection 2 mg  2 mg Intracatheter Once Ananias Kolander, DO         PHYSICAL EXAM:  Wt Readings from Last 3 Encounters:  11/10/23 75.8 kg (167 lb)  11/05/23 75.8 kg (167 lb)  10/19/23 76.1 kg (167 lb 12.8 oz)   Vitals:   11/16/23 1449  BP: 100/60  Pulse: (!) 107  SpO2: 96%   GENERAL: NAD Lungs- CTA CARDIAC:  JVP: 6 cm          Normal rate with regular rhythm. no murmur.  Pulses 2+. NO edema.  ABDOMEN: Soft, non-tender, non-distended.  EXTREMITIES: Warm and well perfused.  NEUROLOGIC: No obvious FND    DATA REVIEW  ECG: 02/28/22: NSR  ECHO: TTE 01/10/22: LVEF 15%-20%, RV function moderately reduced. Moderate MR.  TTE 11/25/22: LVEF 30%,   CMR 04/21/22: 1. Moderate LV dilatation with severe systolic dysfunction (EF 20%). There is akinesis of basal inferior wall and thinning/akinesis of mid to apical anterior wall  2.  Normal RV size with mild systolic dysfunction (EF 40%) 3. Subendocardial late gadolinium enhancement consistent with prior infarct in basal inferior, mid to apical anterior, and apical lateral walls. LGE is less than 50% transmural in basal inferior wall, suggesting viability. LGE is greater than 50% transmural in mid to apical anterior and apical lateral walls, suggesting nonviability in this region.  4.  Moderate tricuspid regurgitation (regurgitant fraction 23%)  5. Mild mitral regurgitation (regurgitant fraction 10%)  CATH: 01/15/22   Ost LM lesion is 20% stenosed.   Prox Cx to Dist Cx lesion is 70% stenosed.   Prox LAD to Mid LAD lesion is 30% stenosed.   Mid RCA lesion is 100% stenosed.   Prox RCA lesion is 70% stenosed.   1.  Significant two-vessel coronary artery disease with  chronically occluded mid right coronary artery with left to right as well as bridging collaterals.  The left circumflex is tortuous and diffusely diseased in the midsegment. 2.  Left ventricular angiography was not performed.  EF was severely reduced by echo. 3.  Right heart catheterization showed significantly elevated right and left-sided filling pressures, moderate to severe pulmonary hypertension and moderately reduced cardiac output.  08/05/22: HEMODYNAMICS: RA:                  6 mmHg (mean) RV:                  27/4-6 mmHg PA:  28/15 mmHg (21 mean) PCWP:            14 mmHg (mean)                                      Estimated Fick CO/CI   4.5 L/min, 2.2 L/min/m2 Thermodilution CO/CI   4.5 L/min, 2.2 L/min/m2                                              TPG                 7  mmHg                                              PVR                 1.5 Wood Units  PAPi                2.2    01/28/23:  RA mean 17 RV 57/21 PA 58/30, mean 39 PCWP mean 28  Oxygen saturations: PA 42% AO 95%  Cardiac Output (Fick) 3.37  Cardiac Index (Fick) 1.66   Cardiac Output (Fick) 3.20  Cardiac Index (Fick) 1.58  PVR 3.4 WU  11/10/23:  EMODYNAMICS: On milrinone  0.125mcg/kg/min RA:                  6 mmHg (mean) RV:                  53/6-10 mmHg PA:                  53/28 mmHg (36 mean) PCWP:            27 mmHg (mean)                                      Estimated Fick CO/CI   3.9 L/min, 2 L/min/m2            Thermodilution CO/CI   4.3 L/min, 2.2 L/min/m2                                      TPG                 9 mmHg                                               PVR                 2.1-2.3 Wood Units  PAPi                4.1     CPX: 06/20/22: Mildly submax test but based on available data there appears to be a severe HF limitations with a blunted BP response to exercise and markedly elevated VeVCO2 slope. Consideration should be given to advanced  HF therapies if  clinically indicated.   ASSESSMENT & PLAN:  End-stage, Stage D, Heart failure with reduced EF, NYHA III Etiology of YQ:Fpkzi, ischemic and nonischemic components; now on home palliative inotropes. NYHA class / AHA Stage: III D on Palliative milrinone  0.125 mcg.   Volume status & Diuretics: Appears euvolemic. Continue torsemide  40mg  daily.  Vasodilators: currently taking Entresto  24/26mg  BID.  Beta-Blocker: holding beta blockers. Digoxin  125mcg daily MRA: Continue eplerenone  12.5mg  daily (reports gynecomastia).  Cardiometabolic: continue jardiance  10mg  daily Devices therapies & Valvulopathies: CMR w/ LVEF 20%.  Patient has met Dr. Fernande for ICD evaluation.  In the interim he has had a very concerning CPX.  Repeat right heart cath with mildly reduced cardiac index.  He does not want to pursue ICD.  Advanced therapies:  Phillip Rich had a CPX with a peak VO2 of 10.9 and severely elevated VE/VCO2 slope.  He does not want to pursue LVAD.  -He has had a decline in functional status once more; currently dependent on milrinone , however, facing financial difficulties due to its costs.  -RHC on  11/10/23 with cardiac index of 2-2.2 L/min/m2 on milrinone  0.125mcg/kg/min. Increased to 0.25mcg/kg/min with improvement in functional class. Still becomes dyspneic with excessive exertion.   2. Pulmonary embolism  - CT from 01/05/22 w/ acute PE involving lobar and segmental branches at the RLL, RML - off AC now.  -CT Chest with new nodules. He has follow up with Dr Geronimo.  - stable.   3. COPD - has not required home O2;  - PFTs with severely reduced DLCO - Unable to start  pulmonary rehab due to inotropes.  - Follow up with Dr. Geronimo next month.   - Will use inhalers PRN.   4. Tobacco abuse - Smoking every now and then.   - Continued discussing cessation.   5. Chronic palliative inotropes - We once again discussed GOC. We discussed that milrinone  only improves quality of life but has not  demonstrated improvement in mortality. He does not wish to pursue advanced therapies at this time.  - see above.   Uriel Horkey 3:02 PM

## 2023-11-16 ENCOUNTER — Ambulatory Visit (HOSPITAL_COMMUNITY)
Admission: RE | Admit: 2023-11-16 | Discharge: 2023-11-16 | Disposition: A | Discharge status: Home / Self Care | Source: Ambulatory Visit | Attending: Cardiology | Admitting: Cardiology

## 2023-11-16 ENCOUNTER — Encounter (HOSPITAL_COMMUNITY): Payer: Self-pay | Admitting: Cardiology

## 2023-11-16 ENCOUNTER — Other Ambulatory Visit (HOSPITAL_COMMUNITY): Payer: Self-pay | Admitting: Cardiology

## 2023-11-16 VITALS — BP 100/60 | HR 107

## 2023-11-16 DIAGNOSIS — Z905 Acquired absence of kidney: Secondary | ICD-10-CM | POA: Diagnosis not present

## 2023-11-16 DIAGNOSIS — R918 Other nonspecific abnormal finding of lung field: Secondary | ICD-10-CM | POA: Insufficient documentation

## 2023-11-16 DIAGNOSIS — J449 Chronic obstructive pulmonary disease, unspecified: Secondary | ICD-10-CM | POA: Insufficient documentation

## 2023-11-16 DIAGNOSIS — E119 Type 2 diabetes mellitus without complications: Secondary | ICD-10-CM | POA: Insufficient documentation

## 2023-11-16 DIAGNOSIS — Z79899 Other long term (current) drug therapy: Secondary | ICD-10-CM | POA: Insufficient documentation

## 2023-11-16 DIAGNOSIS — I2510 Atherosclerotic heart disease of native coronary artery without angina pectoris: Secondary | ICD-10-CM | POA: Insufficient documentation

## 2023-11-16 DIAGNOSIS — I5084 End stage heart failure: Secondary | ICD-10-CM | POA: Insufficient documentation

## 2023-11-16 DIAGNOSIS — Z7985 Long-term (current) use of injectable non-insulin antidiabetic drugs: Secondary | ICD-10-CM | POA: Diagnosis not present

## 2023-11-16 DIAGNOSIS — I5022 Chronic systolic (congestive) heart failure: Secondary | ICD-10-CM | POA: Diagnosis not present

## 2023-11-16 DIAGNOSIS — I110 Hypertensive heart disease with heart failure: Secondary | ICD-10-CM | POA: Insufficient documentation

## 2023-11-16 DIAGNOSIS — Z86711 Personal history of pulmonary embolism: Secondary | ICD-10-CM | POA: Diagnosis not present

## 2023-11-16 DIAGNOSIS — I429 Cardiomyopathy, unspecified: Secondary | ICD-10-CM | POA: Diagnosis not present

## 2023-11-16 DIAGNOSIS — Z7984 Long term (current) use of oral hypoglycemic drugs: Secondary | ICD-10-CM | POA: Diagnosis not present

## 2023-11-16 DIAGNOSIS — F17200 Nicotine dependence, unspecified, uncomplicated: Secondary | ICD-10-CM | POA: Diagnosis not present

## 2023-11-16 DIAGNOSIS — I5023 Acute on chronic systolic (congestive) heart failure: Secondary | ICD-10-CM | POA: Diagnosis not present

## 2023-11-16 NOTE — Patient Instructions (Signed)
 Medication Changes:  No Changes In Medications at this time.   Follow-Up in: 1 MONTH WITH DR. ZENAIDA AS SCHEDULED   At the [facility], you and your health needs are our priority. We have a designated team specialized in the treatment of Heart Failure. This Care Team includes your primary Heart Failure Specialized Cardiologist (physician), Advanced Practice Providers (APPs- Physician Assistants and Nurse Practitioners), and Pharmacist who all work together to provide you with the care you need, when you need it.   You may see any of the following providers on your designated Care Team at your next follow up:  Dr. Toribio Fuel Dr. Ezra Shuck Dr. Ria Commander Dr. Odis ZENAIDA Greig Mosses, NP Caffie Shed, [place] [facility] [place], [place] Beckey Coe, NP [place] Lee, NP Tinnie Redman, PharmD   Please be sure to bring in all your medications bottles to every appointment.   Need to Contact Us :  If you have any questions or concerns before your next appointment please send us  a message through [address] or call our office at [phone].    TO LEAVE A MESSAGE FOR THE NURSE SELECT OPTION 2, PLEASE LEAVE A MESSAGE INCLUDING: YOUR NAME DATE OF BIRTH CALL BACK NUMBER REASON FOR CALL**this is important as we prioritize the call backs  YOU WILL RECEIVE A CALL BACK THE SAME DAY AS LONG AS YOU CALL BEFORE 4:00 PM

## 2023-11-16 NOTE — Addendum Note (Signed)
 Encounter addended by: Masud Holub B, RN on: 11/16/2023 3:20 PM  Actions taken: Clinical Note Signed

## 2023-11-23 ENCOUNTER — Encounter: Payer: Self-pay | Admitting: Student

## 2023-11-23 ENCOUNTER — Ambulatory Visit: Attending: Student | Admitting: Student

## 2023-11-23 VITALS — BP 108/69 | HR 115 | Ht 69.0 in | Wt 162.0 lb

## 2023-11-23 DIAGNOSIS — I5022 Chronic systolic (congestive) heart failure: Secondary | ICD-10-CM

## 2023-11-23 DIAGNOSIS — I429 Cardiomyopathy, unspecified: Secondary | ICD-10-CM | POA: Diagnosis not present

## 2023-11-23 DIAGNOSIS — I5023 Acute on chronic systolic (congestive) heart failure: Secondary | ICD-10-CM | POA: Diagnosis not present

## 2023-11-23 DIAGNOSIS — Z79899 Other long term (current) drug therapy: Secondary | ICD-10-CM | POA: Diagnosis not present

## 2023-11-23 NOTE — Patient Instructions (Signed)
 Medication Instructions:  Your physician recommends that you continue on your current medications as directed. Please refer to the Current Medication list given to you today.  *If you need a refill on your cardiac medications before your next appointment, please call your pharmacy*  Lab Work: None ordered If you have labs (blood work) drawn today and your tests are completely normal, you will receive your results only by: MyChart Message (if you have MyChart) OR A paper copy in the mail If you have any lab test that is abnormal or we need to change your treatment, we will call you to review the results.  Follow-Up: At [facility], you and your health needs are our priority.  As part of our continuing mission to provide you with exceptional heart care, our providers are all part of one team.  This team includes your primary Cardiologist (physician) and Advanced Practice Providers or APPs (Physician Assistants and Nurse Practitioners) who all work together to provide you with the care you need, when you need it.  Your next appointment:   As needed  Provider:   You may see OLE ONEIDA HOLTS, MD or one of the following Advanced Practice Providers on your designated Care Team:   Charlies Arthur, PA-C Michael Andy Tillery, PA-C Suzann Riddle, NP Daphne Barrack, NP   We recommend signing up for the patient portal called MyChart.  Sign up information is provided on this After Visit Summary.  MyChart is used to connect with patients for Virtual Visits (Telemedicine).  Patients are able to view lab/test results, encounter notes, upcoming appointments, etc.  Non-urgent messages can be sent to your provider as well.   To learn more about what you can do with MyChart, go to [email or web address].

## 2023-11-23 NOTE — Progress Notes (Signed)
 Electrophysiology Office Note:    Date:  11/23/2023   ID:  Phillip Rich, DOB 11-Aug-1950, MRN [id number]  [facility] Cardiologist:  Lynwood Schilling, MD  [facility] Electrophysiologist:  OLE ONEIDA HOLTS, MD   Referring MD: Charlott Dorn LABOR, *   Chief Complaint: Chronic systolic heart failure  History of Present Illness:    Phillip Rich is a 73 year old man who we are seeing today for an evaluation of chronic systolic heart failure at the request of Dr. Gardenia.  The patient has a history of diabetes, hypertension, nephrectomy, COPD and chronic systolic heart failure.  He has severe coronary artery disease that is the culprit for his systolic heart failure.  He has seen Dr. Fernande for ICD evaluation in February 2024.  Unclear exactly what the conversation was about ICD at that appointment but final decision was ultimately deferred.  He was previously referred specifically for evaluation for CCM therapy.  He currently does not have an ICD.  Had RHC 7/1 with up-titration of his milrinone  due to persistent low output.   Had discussion with EP MDs after last visit and we are unable to offer CCM at this time due to lack of evidence in this patient population.  Success rates for approval have varied for [facility], which were for patients that did fall within indication.   Had attempted multiple times to call pt and discuss with no answer or return call.   Pt frustrated and disappointed at lack of options.   Their past medical, social and family history was reviewed.  Review of systems complete and found to be negative unless listed in HPI.    Studies reviewed   CMR 04/21/22: 1. Moderate LV dilatation with severe systolic dysfunction (EF 20%). There is akinesis of basal inferior wall and thinning/akinesis of mid to apical anterior wall  2.  Normal RV size with mild systolic dysfunction (EF 40%) 3. Subendocardial late gadolinium enhancement consistent with prior infarct in  basal inferior, mid to apical anterior, and apical lateral walls. LGE is less than 50% transmural in basal inferior wall, suggesting viability. LGE is greater than 50% transmural in mid to apical anterior and apical lateral walls, suggesting nonviability in this region.  4.  Moderate tricuspid regurgitation (regurgitant fraction 23%)  5. Mild mitral regurgitation (regurgitant fraction 10%)   RHC 08/05/22: HEMODYNAMICS: RA:                  6 mmHg (mean) RV:                  27/4-6 mmHg PA:                  28/15 mmHg (21 mean) PCWP:            14 mmHg (mean)                                      Estimated Fick CO/CI   4.5 L/min, 2.2 L/min/m2 Thermodilution CO/CI   4.5 L/min, 2.2 L/min/m2                                              TPG                 7  mmHg  PVR                 1.5 Wood Units  PAPi                2.2     RHC 01/28/23:  RA mean 17 RV 57/21 PA 58/30, mean 39 PCWP mean 28 Oxygen saturations: PA 42% AO 95% Cardiac Output (Fick) 3.37  Cardiac Index (Fick) 1.66  Cardiac Output (Fick) 3.20  Cardiac Index (Fick) 1.58 PVR 3.4 WU     CPX: 06/20/22: Mildly submax test but based on available data there appears to be a severe HF limitations with a blunted BP response to exercise and markedly elevated VeVCO2 slope. Consideration should be given to advanced HF therapies if clinically indicated.   RHC 11/10/2023 On milrinone  0.125mcg/kg/min RA:                  6 mmHg (mean) RV:                  53/6-10 mmHg PA:                  53/28 mmHg (36 mean) PCWP:            27 mmHg (mean)                                      Estimated Fick CO/CI   3.9 L/min, 2 L/min/m2            Thermodilution CO/CI   4.3 L/min, 2.2 L/min/m2                                      TPG                 9 mmHg                                               PVR                 2.1-2.3 Wood Units  PAPi                4.1    Physical Exam:    VS:  BP  108/69   Pulse (!) 115   Ht 5' 9 (1.753 m)   Wt 162 lb (73.5 kg)   SpO2 96%   BMI 23.92 kg/m     Wt Readings from Last 3 Encounters:  11/23/23 162 lb (73.5 kg)  11/10/23 167 lb (75.8 kg)  11/05/23 167 lb (75.8 kg)   GEN: NAD, !&O x 3 NECK: No JVD; No carotid bruits CARDIAC: Regular rate and rhythm, no murmurs, rubs, gallops RESPIRATORY:  Clear to auscultation without rales, wheezing or rhonchi  ABDOMEN: Soft, non-tender, non-distended EXTREMITIES:  No edema; No deformity    ASSESSMENT AND PLAN:    Chronic systolic heart failure, Stage D EF 20 to 25% by Echo 01/2023. NYHA class III symptoms on home milrinone  since 01/2023. RHC 11/10/2023 with severely reduced CI despite inotrope.  Milrinone  re-titrated to 0.25  Had previously discussed with HF team that we are unable to offer CCM therapy, given his advanced stage of HF and lack of  evidence that risk of the procedure would outweigh benefit.   He is only interested in therapies that will improve his quality of life.   He can follow up with EP as needed only. Happy to answer further questions.   Ozell Jodie Passey, PA-C  11/23/2023 12:36 PM

## 2023-11-30 DIAGNOSIS — I5023 Acute on chronic systolic (congestive) heart failure: Secondary | ICD-10-CM | POA: Diagnosis not present

## 2023-11-30 DIAGNOSIS — I429 Cardiomyopathy, unspecified: Secondary | ICD-10-CM | POA: Diagnosis not present

## 2023-12-07 DIAGNOSIS — I5023 Acute on chronic systolic (congestive) heart failure: Secondary | ICD-10-CM | POA: Diagnosis not present

## 2023-12-07 DIAGNOSIS — I429 Cardiomyopathy, unspecified: Secondary | ICD-10-CM | POA: Diagnosis not present

## 2023-12-14 DIAGNOSIS — I429 Cardiomyopathy, unspecified: Secondary | ICD-10-CM | POA: Diagnosis not present

## 2023-12-14 DIAGNOSIS — I5023 Acute on chronic systolic (congestive) heart failure: Secondary | ICD-10-CM | POA: Diagnosis not present

## 2023-12-15 DIAGNOSIS — I429 Cardiomyopathy, unspecified: Secondary | ICD-10-CM | POA: Diagnosis not present

## 2023-12-15 DIAGNOSIS — I5023 Acute on chronic systolic (congestive) heart failure: Secondary | ICD-10-CM | POA: Diagnosis not present

## 2023-12-16 ENCOUNTER — Inpatient Hospital Stay (HOSPITAL_COMMUNITY): Admission: RE | Admit: 2023-12-16 | Source: Ambulatory Visit | Admitting: Cardiology

## 2023-12-21 DIAGNOSIS — I429 Cardiomyopathy, unspecified: Secondary | ICD-10-CM | POA: Diagnosis not present

## 2023-12-21 DIAGNOSIS — I5023 Acute on chronic systolic (congestive) heart failure: Secondary | ICD-10-CM | POA: Diagnosis not present

## 2023-12-25 DIAGNOSIS — I5023 Acute on chronic systolic (congestive) heart failure: Secondary | ICD-10-CM | POA: Diagnosis not present

## 2023-12-25 DIAGNOSIS — I429 Cardiomyopathy, unspecified: Secondary | ICD-10-CM | POA: Diagnosis not present

## 2023-12-30 ENCOUNTER — Ambulatory Visit (HOSPITAL_COMMUNITY)
Admission: RE | Admit: 2023-12-30 | Discharge: 2023-12-30 | Disposition: A | Discharge status: Home / Self Care | Source: Ambulatory Visit | Attending: Cardiology | Admitting: Cardiology

## 2023-12-30 ENCOUNTER — Encounter (HOSPITAL_COMMUNITY): Payer: Self-pay | Admitting: Cardiology

## 2023-12-30 VITALS — BP 126/74 | HR 103 | Ht 69.0 in | Wt 168.0 lb

## 2023-12-30 DIAGNOSIS — I429 Cardiomyopathy, unspecified: Secondary | ICD-10-CM | POA: Diagnosis not present

## 2023-12-30 DIAGNOSIS — I5020 Unspecified systolic (congestive) heart failure: Secondary | ICD-10-CM | POA: Diagnosis not present

## 2023-12-30 DIAGNOSIS — R7889 Finding of other specified substances, not normally found in blood: Secondary | ICD-10-CM | POA: Diagnosis not present

## 2023-12-30 DIAGNOSIS — F17200 Nicotine dependence, unspecified, uncomplicated: Secondary | ICD-10-CM | POA: Insufficient documentation

## 2023-12-30 DIAGNOSIS — J449 Chronic obstructive pulmonary disease, unspecified: Secondary | ICD-10-CM | POA: Insufficient documentation

## 2023-12-30 DIAGNOSIS — I110 Hypertensive heart disease with heart failure: Secondary | ICD-10-CM | POA: Diagnosis not present

## 2023-12-30 DIAGNOSIS — I2510 Atherosclerotic heart disease of native coronary artery without angina pectoris: Secondary | ICD-10-CM | POA: Diagnosis not present

## 2023-12-30 DIAGNOSIS — Z86711 Personal history of pulmonary embolism: Secondary | ICD-10-CM | POA: Diagnosis not present

## 2023-12-30 DIAGNOSIS — I5023 Acute on chronic systolic (congestive) heart failure: Secondary | ICD-10-CM | POA: Diagnosis not present

## 2023-12-30 DIAGNOSIS — I10 Essential (primary) hypertension: Secondary | ICD-10-CM

## 2023-12-30 DIAGNOSIS — I5084 End stage heart failure: Secondary | ICD-10-CM | POA: Insufficient documentation

## 2023-12-30 DIAGNOSIS — Z79899 Other long term (current) drug therapy: Secondary | ICD-10-CM | POA: Diagnosis not present

## 2023-12-30 NOTE — Patient Instructions (Signed)
   Follow-Up in: 2 months as scheduled with Dr. Gardenia   At the [facility], you and your health needs are our priority. We have a designated team specialized in the treatment of Heart Failure. This Care Team includes your primary Heart Failure Specialized Cardiologist (physician), Advanced Practice Providers (APPs- Physician Assistants and Nurse Practitioners), and Pharmacist who all work together to provide you with the care you need, when you need it.   You may see any of the following providers on your designated Care Team at your next follow up:  Dr. Toribio Fuel Dr. Ezra Shuck Dr. Ria Gardenia Dr. Odis Brownie Greig Mosses, NP Caffie Shed, [place] [facility], [place] Beckey Coe, NP [place] Lee, NP Tinnie Redman, PharmD   Please be sure to bring in all your medications bottles to every appointment.   Need to Contact Us :  If you have any questions or concerns before your next appointment please send us  a message through [address] or call our office at [phone].    TO LEAVE A MESSAGE FOR THE NURSE SELECT OPTION 2, PLEASE LEAVE A MESSAGE INCLUDING: YOUR NAME DATE OF BIRTH CALL BACK NUMBER REASON FOR CALL**this is important as we prioritize the call backs  YOU WILL RECEIVE A CALL BACK THE SAME DAY AS LONG AS YOU CALL BEFORE 4:00 PM

## 2024-01-01 DIAGNOSIS — I429 Cardiomyopathy, unspecified: Secondary | ICD-10-CM | POA: Diagnosis not present

## 2024-01-01 DIAGNOSIS — I5023 Acute on chronic systolic (congestive) heart failure: Secondary | ICD-10-CM | POA: Diagnosis not present

## 2024-01-02 NOTE — Progress Notes (Signed)
 ADVANCED HEART FAILURE CLINIC NOTE  Referring Physician: Charlott Dorn Rich, *  Primary Care: Phillip Dorn LABOR, MD Primary Cardiologist: N/A  CC: End stage heart failure, Stage D on home palliative inotropes  HPI: Phillip Rich is a 73 y.o. male with T2DM, HTN, renal mass s/p nephrectomy, COPD that presented to the [facility] ER w/ acute onset SOB on 08/27 when he was diagnosed with acute PE and HFrEF w/ an LVEF of 25%-30%; during admission, coronary angiography was significant for CTO of the RCA and diffusely diseased Lcx. RHC w/ elevated pre and post capillary filling pressures. He was diuresed and discharged home and started on some GDMT. Since that time, Phillip Rich has had a CMR demonstrating persistently reduced LV function (EF 20%) and CPX with severe cardiac limitations.   He was directly admitted for low output heart failure in late 2024.  During that time right heart catheterization consistent with severely reduced cardiac index.  He was started on IV inotropes and underwent LVAD evaluation.  Unfortunately LVAD evaluation was placed on hold after CT chest demonstrated severe lung disease with severely reduced DLCO.  He was ultimately discharged home on IV milrinone .  Since initiation of milrinone  he has had significant improvement in quality of life.   CT chest- with some new nodules. He will need another scan to follow up, this has been ordered.   Interval hx:  - Feeling much better on the higher dose of milrinone . - Feels lucky to have made it this far with his end stage heart failure. We discussed median mortality on inotropes, improvements seen with modern medical therapy and he was encouraged - Visiting his millionaire friend in [place] . Friend is a high stakes gambler so gets a lot of things comped. Excited for the trip.    Current Outpatient Medications  Medication Sig Dispense Refill   albuterol  (VENTOLIN  HFA) 108 (90 Base) MCG/ACT inhaler Inhale 1-2 puffs into  the lungs every 6 (six) hours as needed for wheezing or shortness of breath. 1 each 0   atorvastatin  (LIPITOR ) 80 MG tablet Take 1 tablet (80 mg total) by mouth daily. 90 tablet 3   Blood Glucose Monitoring Suppl (ONETOUCH VERIO FLEX SYSTEM) w/Device KIT Use to check blood sugar for 365 days twice a day DX: E11.65     Cholecalciferol  (VITAMIN D3 MAXIMUM STRENGTH) 125 MCG (5000 UT) capsule Take 5,000 Units by mouth daily.     dextromethorphan  (DELSYM ) 30 MG/5ML liquid Take 60 mg by mouth as needed for cough.     digoxin  (LANOXIN ) 0.125 MG tablet Take 1 tablet (125 mcg total) by mouth daily. 90 tablet 1   DULoxetine  (CYMBALTA ) 60 MG capsule Take 60 mg by mouth daily.     empagliflozin  (JARDIANCE ) 10 MG TABS tablet Take 1 tablet (10 mg total) by mouth daily before breakfast. 90 tablet 3   eplerenone  (INSPRA ) 25 MG tablet Take 0.5 tablets (12.5 mg total) by mouth daily. 45 tablet 3   Lancets (ONETOUCH DELICA PLUS LANCET33G) MISC Apply topically 2 (two) times daily.     levocetirizine (XYZAL) 5 MG tablet Take 5 mg by mouth daily.     Melatonin 10 MG TABS Take 10 mg by mouth at bedtime as needed (Sleep).     metFORMIN (GLUCOPHAGE) 1000 MG tablet Take 1,000 mg by mouth 2 (two) times daily.      milrinone  (PRIMACOR ) 20 MG/100 ML SOLN infusion Inject 0.0182 mg/min into the vein continuous.     naproxen sodium (ALEVE)  220 MG tablet Take 440 mg by mouth daily as needed (pain).     omeprazole (PRILOSEC) 20 MG capsule Take 1 tablet by mouth every morning.      ONETOUCH VERIO test strip Check blood sugar In Vitro twice a day DX: E11.65 for 100 days     sacubitril -valsartan  (ENTRESTO ) 24-26 MG Take 1 tablet by mouth 2 (two) times daily. 1 tablet in the am and 1/2 tablet in the evening 60 tablet 5   Semaglutide,0.25 or 0.5MG /DOS, (OZEMPIC, 0.25 OR 0.5 MG/DOSE,) 2 MG/3ML SOPN Inject 0.5 mg into the skin once a week.     torsemide  (DEMADEX ) 20 MG tablet Take 20 mg by mouth daily.     vitamin B-12 (CYANOCOBALAMIN)  500 MCG tablet Take 500 mcg by mouth daily.     zolpidem  (AMBIEN ) 10 MG tablet Take 10 mg by mouth at bedtime as needed for sleep.     No current facility-administered medications for this encounter.   Facility-Administered Medications Ordered in Other Encounters  Medication Dose Route Frequency Provider Last Rate Last Admin   alteplase  (CATHFLO ACTIVASE ) injection 2 mg  2 mg Intracatheter Once Sabharwal, [name], DO         PHYSICAL EXAM:  Wt Readings from Last 3 Encounters:  12/30/23 76.2 kg (168 lb)  11/23/23 73.5 kg (162 lb)  11/10/23 75.8 kg (167 lb)   Vitals:   12/30/23 1510  BP: 126/74  Pulse: (!) 103  SpO2: 97%   GENERAL: NAD, well appearing PULM:  Normal work of breathing, CTAB CARDIAC:  JVP: flat         Normal rate with regular rhythm. No murmurs, rubs or gallops.  Trace edema. Warm and well perfused extremities. ABDOMEN: Soft, non-tender, non-distended. NEUROLOGIC: Patient is oriented x3 with no focal or lateralizing neurologic deficits.      DATA REVIEW  ECG: 02/28/22: NSR  ECHO: TTE 01/10/22: LVEF 15%-20%, RV function moderately reduced. Moderate MR.  TTE 11/25/22: LVEF 30%,   CMR 04/21/22: 1. Moderate LV dilatation with severe systolic dysfunction (EF 20%). There is akinesis of basal inferior wall and thinning/akinesis of mid to apical anterior wall  2.  Normal RV size with mild systolic dysfunction (EF 40%) 3. Subendocardial late gadolinium enhancement consistent with prior infarct in basal inferior, mid to apical anterior, and apical lateral walls. LGE is less than 50% transmural in basal inferior wall, suggesting viability. LGE is greater than 50% transmural in mid to apical anterior and apical lateral walls, suggesting nonviability in this region.  4.  Moderate tricuspid regurgitation (regurgitant fraction 23%)  5. Mild mitral regurgitation (regurgitant fraction 10%)  CATH: 01/15/22   Ost LM lesion is 20% stenosed.   Prox Cx to Dist Cx lesion  is 70% stenosed.   Prox LAD to Mid LAD lesion is 30% stenosed.   Mid RCA lesion is 100% stenosed.   Prox RCA lesion is 70% stenosed.   1.  Significant two-vessel coronary artery disease with chronically occluded mid right coronary artery with left to right as well as bridging collaterals.  The left circumflex is tortuous and diffusely diseased in the midsegment. 2.  Left ventricular angiography was not performed.  EF was severely reduced by echo. 3.  Right heart catheterization showed significantly elevated right and left-sided filling pressures, moderate to severe pulmonary hypertension and moderately reduced cardiac output.  08/05/22: HEMODYNAMICS: RA:                  6 mmHg (mean) RV:  27/4-6 mmHg PA:                  28/15 mmHg (21 mean) PCWP:            14 mmHg (mean)                                      Estimated Fick CO/CI   4.5 L/min, 2.2 L/min/m2 Thermodilution CO/CI   4.5 L/min, 2.2 L/min/m2                                              TPG                 7  mmHg                                              PVR                 1.5 Wood Units  PAPi                2.2    01/28/23:  RA mean 17 RV 57/21 PA 58/30, mean 39 PCWP mean 28  Oxygen saturations: PA 42% AO 95%  Cardiac Output (Fick) 3.37  Cardiac Index (Fick) 1.66   Cardiac Output (Fick) 3.20  Cardiac Index (Fick) 1.58  PVR 3.4 WU  11/10/23:  EMODYNAMICS: On milrinone  0.125mcg/kg/min RA:                  6 mmHg (mean) RV:                  53/6-10 mmHg PA:                  53/28 mmHg (36 mean) PCWP:            27 mmHg (mean)                                      Estimated Fick CO/CI   3.9 L/min, 2 L/min/m2            Thermodilution CO/CI   4.3 L/min, 2.2 L/min/m2                                      TPG                 9 mmHg                                               PVR                 2.1-2.3 Wood Units  PAPi                4.1     CPX: 06/20/22: Mildly submax test but based on  available data there appears to be a  severe HF limitations with a blunted BP response to exercise and markedly elevated VeVCO2 slope. Consideration should be given to advanced HF therapies if clinically indicated.   ASSESSMENT & PLAN:  End-stage, Stage D, Heart failure with reduced EF, NYHA III Etiology of YQ:Fpkzi, ischemic and nonischemic components; now on home palliative inotropes. NYHA class / AHA Stage: III D on Palliative milrinone  0.25, continue   Volume status & Diuretics: Appears euvolemic. Continue torsemide  40mg  daily Vasodilators: Continue entresto  24/26mg  BID Beta-Blocker: holding beta blockers, no significant arrhythmia issues. Digoxin  125mcg daily MRA: Continue eplerenone  12.5mg  daily Cardiometabolic: Continue jardiance  10mg  daily Devices therapies & Valvulopathies: CMR w/ LVEF 20%.  Patient has met Dr. Fernande for ICD evaluation.  In the interim he has had a very concerning CPX.  Repeat right heart cath with mildly reduced cardiac index.  He does not want to pursue ICD.  Advanced therapies:  Mr. Mogel had a CPX with a peak VO2 of 10.9 and severely elevated VE/VCO2 slope.  He does not want to pursue LVAD at a tertiary center - Stable NYHA class III on milrinone , but feels better on increased dose  2. Pulmonary embolism  - CT from 01/05/22 w/ acute PE involving lobar and segmental branches at the RLL, RML - off AC now.  -CT Chest with new nodules. He has follow up with Dr Geronimo. Repeat CT scan pending  - stable.   3. COPD - has not required home O2;  - PFTs with severely reduced DLCO - Unable to start  pulmonary rehab due to inotropes.  - Follow up with Dr. Geronimo - Will use inhalers PRN.   4. Tobacco abuse - Smoking every now and then.   - Continued discussing cessation.   5. Chronic palliative inotropes - Continues to decline potential referral for LVAD at alternative centers. Happy with his current quality of life - see above.   I spent 48 minutes caring  for this patient today including face to face time, ordering and reviewing labs, reviewing records from Dr. Gardenia including mutiple cardiac catheterizations, discussing home inotropes and advanced therapy, seeing the patient, documenting in the record, and arranging follow ups.   Odis Brownie 2:05 PM

## 2024-01-03 ENCOUNTER — Emergency Department (HOSPITAL_COMMUNITY)

## 2024-01-03 ENCOUNTER — Inpatient Hospital Stay (HOSPITAL_COMMUNITY)

## 2024-01-03 DIAGNOSIS — Z885 Allergy status to narcotic agent status: Secondary | ICD-10-CM

## 2024-01-03 DIAGNOSIS — G931 Anoxic brain damage, not elsewhere classified: Secondary | ICD-10-CM | POA: Diagnosis not present

## 2024-01-03 DIAGNOSIS — Z905 Acquired absence of kidney: Secondary | ICD-10-CM

## 2024-01-03 DIAGNOSIS — J8410 Pulmonary fibrosis, unspecified: Secondary | ICD-10-CM | POA: Diagnosis not present

## 2024-01-03 DIAGNOSIS — I21A1 Myocardial infarction type 2: Secondary | ICD-10-CM | POA: Diagnosis present

## 2024-01-03 DIAGNOSIS — I472 Ventricular tachycardia, unspecified: Secondary | ICD-10-CM | POA: Diagnosis not present

## 2024-01-03 DIAGNOSIS — J9621 Acute and chronic respiratory failure with hypoxia: Secondary | ICD-10-CM | POA: Diagnosis present

## 2024-01-03 DIAGNOSIS — Z96652 Presence of left artificial knee joint: Secondary | ICD-10-CM | POA: Diagnosis present

## 2024-01-03 DIAGNOSIS — E872 Acidosis, unspecified: Secondary | ICD-10-CM | POA: Diagnosis not present

## 2024-01-03 DIAGNOSIS — I5023 Acute on chronic systolic (congestive) heart failure: Secondary | ICD-10-CM | POA: Diagnosis not present

## 2024-01-03 DIAGNOSIS — R569 Unspecified convulsions: Secondary | ICD-10-CM | POA: Diagnosis not present

## 2024-01-03 DIAGNOSIS — I5084 End stage heart failure: Secondary | ICD-10-CM | POA: Diagnosis not present

## 2024-01-03 DIAGNOSIS — I462 Cardiac arrest due to underlying cardiac condition: Secondary | ICD-10-CM | POA: Diagnosis present

## 2024-01-03 DIAGNOSIS — I429 Cardiomyopathy, unspecified: Secondary | ICD-10-CM | POA: Diagnosis present

## 2024-01-03 DIAGNOSIS — Z888 Allergy status to other drugs, medicaments and biological substances status: Secondary | ICD-10-CM

## 2024-01-03 DIAGNOSIS — Z881 Allergy status to other antibiotic agents status: Secondary | ICD-10-CM

## 2024-01-03 DIAGNOSIS — Z66 Do not resuscitate: Secondary | ICD-10-CM | POA: Diagnosis not present

## 2024-01-03 DIAGNOSIS — I4901 Ventricular fibrillation: Secondary | ICD-10-CM | POA: Diagnosis not present

## 2024-01-03 DIAGNOSIS — E1122 Type 2 diabetes mellitus with diabetic chronic kidney disease: Secondary | ICD-10-CM | POA: Diagnosis not present

## 2024-01-03 DIAGNOSIS — I130 Hypertensive heart and chronic kidney disease with heart failure and stage 1 through stage 4 chronic kidney disease, or unspecified chronic kidney disease: Secondary | ICD-10-CM | POA: Diagnosis present

## 2024-01-03 DIAGNOSIS — D72829 Elevated white blood cell count, unspecified: Secondary | ICD-10-CM | POA: Diagnosis not present

## 2024-01-03 DIAGNOSIS — F17210 Nicotine dependence, cigarettes, uncomplicated: Secondary | ICD-10-CM | POA: Diagnosis present

## 2024-01-03 DIAGNOSIS — I4891 Unspecified atrial fibrillation: Secondary | ICD-10-CM | POA: Diagnosis present

## 2024-01-03 DIAGNOSIS — Z86711 Personal history of pulmonary embolism: Secondary | ICD-10-CM

## 2024-01-03 DIAGNOSIS — I080 Rheumatic disorders of both mitral and aortic valves: Secondary | ICD-10-CM | POA: Diagnosis present

## 2024-01-03 DIAGNOSIS — K219 Gastro-esophageal reflux disease without esophagitis: Secondary | ICD-10-CM | POA: Diagnosis present

## 2024-01-03 DIAGNOSIS — I214 Non-ST elevation (NSTEMI) myocardial infarction: Secondary | ICD-10-CM | POA: Diagnosis not present

## 2024-01-03 DIAGNOSIS — N179 Acute kidney failure, unspecified: Secondary | ICD-10-CM | POA: Diagnosis not present

## 2024-01-03 DIAGNOSIS — E1165 Type 2 diabetes mellitus with hyperglycemia: Secondary | ICD-10-CM | POA: Diagnosis present

## 2024-01-03 DIAGNOSIS — R570 Cardiogenic shock: Secondary | ICD-10-CM | POA: Diagnosis not present

## 2024-01-03 DIAGNOSIS — Z5986 Financial insecurity: Secondary | ICD-10-CM

## 2024-01-03 DIAGNOSIS — I2510 Atherosclerotic heart disease of native coronary artery without angina pectoris: Secondary | ICD-10-CM | POA: Diagnosis present

## 2024-01-03 DIAGNOSIS — R29818 Other symptoms and signs involving the nervous system: Secondary | ICD-10-CM | POA: Diagnosis not present

## 2024-01-03 DIAGNOSIS — N1832 Chronic kidney disease, stage 3b: Secondary | ICD-10-CM | POA: Diagnosis present

## 2024-01-03 DIAGNOSIS — J849 Interstitial pulmonary disease, unspecified: Secondary | ICD-10-CM

## 2024-01-03 DIAGNOSIS — Z8249 Family history of ischemic heart disease and other diseases of the circulatory system: Secondary | ICD-10-CM

## 2024-01-03 DIAGNOSIS — E871 Hypo-osmolality and hyponatremia: Secondary | ICD-10-CM | POA: Diagnosis present

## 2024-01-03 DIAGNOSIS — J9622 Acute and chronic respiratory failure with hypercapnia: Secondary | ICD-10-CM | POA: Diagnosis present

## 2024-01-03 DIAGNOSIS — E8881 Metabolic syndrome: Secondary | ICD-10-CM | POA: Diagnosis present

## 2024-01-03 DIAGNOSIS — I440 Atrioventricular block, first degree: Secondary | ICD-10-CM | POA: Diagnosis present

## 2024-01-03 DIAGNOSIS — I4510 Unspecified right bundle-branch block: Secondary | ICD-10-CM | POA: Diagnosis present

## 2024-01-03 DIAGNOSIS — R7989 Other specified abnormal findings of blood chemistry: Secondary | ICD-10-CM | POA: Diagnosis not present

## 2024-01-03 DIAGNOSIS — Z79899 Other long term (current) drug therapy: Secondary | ICD-10-CM

## 2024-01-03 DIAGNOSIS — I469 Cardiac arrest, cause unspecified: Secondary | ICD-10-CM | POA: Diagnosis present

## 2024-01-03 DIAGNOSIS — Z515 Encounter for palliative care: Secondary | ICD-10-CM | POA: Diagnosis not present

## 2024-01-03 DIAGNOSIS — R000 Tachycardia, unspecified: Secondary | ICD-10-CM | POA: Diagnosis not present

## 2024-01-03 DIAGNOSIS — Z4682 Encounter for fitting and adjustment of non-vascular catheter: Secondary | ICD-10-CM | POA: Diagnosis not present

## 2024-01-03 DIAGNOSIS — N400 Enlarged prostate without lower urinary tract symptoms: Secondary | ICD-10-CM | POA: Diagnosis present

## 2024-01-03 DIAGNOSIS — J441 Chronic obstructive pulmonary disease with (acute) exacerbation: Secondary | ICD-10-CM | POA: Diagnosis present

## 2024-01-03 DIAGNOSIS — G4733 Obstructive sleep apnea (adult) (pediatric): Secondary | ICD-10-CM | POA: Diagnosis present

## 2024-01-03 DIAGNOSIS — J9601 Acute respiratory failure with hypoxia: Secondary | ICD-10-CM

## 2024-01-03 DIAGNOSIS — Z803 Family history of malignant neoplasm of breast: Secondary | ICD-10-CM

## 2024-01-03 DIAGNOSIS — Z7985 Long-term (current) use of injectable non-insulin antidiabetic drugs: Secondary | ICD-10-CM

## 2024-01-03 DIAGNOSIS — E785 Hyperlipidemia, unspecified: Secondary | ICD-10-CM | POA: Diagnosis present

## 2024-01-03 DIAGNOSIS — R6889 Other general symptoms and signs: Secondary | ICD-10-CM | POA: Diagnosis not present

## 2024-01-03 DIAGNOSIS — Z800 Family history of malignant neoplasm of digestive organs: Secondary | ICD-10-CM

## 2024-01-03 DIAGNOSIS — R059 Cough, unspecified: Secondary | ICD-10-CM | POA: Diagnosis present

## 2024-01-03 DIAGNOSIS — J449 Chronic obstructive pulmonary disease, unspecified: Secondary | ICD-10-CM | POA: Diagnosis not present

## 2024-01-03 DIAGNOSIS — R579 Shock, unspecified: Secondary | ICD-10-CM | POA: Diagnosis not present

## 2024-01-03 DIAGNOSIS — Z825 Family history of asthma and other chronic lower respiratory diseases: Secondary | ICD-10-CM

## 2024-01-03 DIAGNOSIS — Z8261 Family history of arthritis: Secondary | ICD-10-CM

## 2024-01-03 DIAGNOSIS — Z743 Need for continuous supervision: Secondary | ICD-10-CM | POA: Diagnosis not present

## 2024-01-03 DIAGNOSIS — Z801 Family history of malignant neoplasm of trachea, bronchus and lung: Secondary | ICD-10-CM

## 2024-01-03 DIAGNOSIS — Z85820 Personal history of malignant melanoma of skin: Secondary | ICD-10-CM

## 2024-01-03 DIAGNOSIS — Z7984 Long term (current) use of oral hypoglycemic drugs: Secondary | ICD-10-CM

## 2024-01-03 DIAGNOSIS — D6489 Other specified anemias: Secondary | ICD-10-CM | POA: Diagnosis present

## 2024-01-03 DIAGNOSIS — I499 Cardiac arrhythmia, unspecified: Secondary | ICD-10-CM | POA: Diagnosis not present

## 2024-01-03 LAB — URINALYSIS, ROUTINE W REFLEX MICROSCOPIC
Bilirubin Urine: NEGATIVE
Glucose, UA: >=500 mg/dL — AB
Ketones, ur: NEGATIVE mg/dL
Nitrite: NEGATIVE
Protein, ur: 100 mg/dL — AB
Specific Gravity, Urine: 1.015 (ref 1.005–1.030)
pH: 5 (ref 5.0–8.0)

## 2024-01-03 LAB — MAGNESIUM: Magnesium: 2.1 mg/dL (ref 1.7–2.4)

## 2024-01-03 LAB — CBC WITH DIFFERENTIAL/PLATELET
Basophils Absolute: 0.5 K/uL — ABNORMAL HIGH (ref 0.0–0.1)
Basophils Relative: 2 %
Eosinophils Absolute: 1.9 K/uL — ABNORMAL HIGH (ref 0.0–0.5)
Eosinophils Relative: 8 %
HCT: 35.3 % — ABNORMAL LOW (ref 39.0–52.0)
Hemoglobin: 10.5 g/dL — ABNORMAL LOW (ref 13.0–17.0)
Lymphocytes Relative: 24 %
Lymphs Abs: 5.6 K/uL — ABNORMAL HIGH (ref 0.7–4.0)
MCH: 28.8 pg (ref 26.0–34.0)
MCHC: 29.7 g/dL — ABNORMAL LOW (ref 30.0–36.0)
MCV: 96.7 fL (ref 80.0–100.0)
Monocytes Absolute: 0.9 K/uL (ref 0.1–1.0)
Monocytes Relative: 4 %
Neutro Abs: 14.4 K/uL — ABNORMAL HIGH (ref 1.7–7.7)
Neutrophils Relative %: 62 %
Platelets: 332 K/uL (ref 150–400)
RBC: 3.65 MIL/uL — ABNORMAL LOW (ref 4.22–5.81)
RDW: 16.5 % — ABNORMAL HIGH (ref 11.5–15.5)
WBC: 23.3 K/uL — ABNORMAL HIGH (ref 4.0–10.5)
nRBC: 0.1 % (ref 0.0–0.2)

## 2024-01-03 LAB — T4, FREE: Free T4: 0.85 ng/dL (ref 0.61–1.12)

## 2024-01-03 LAB — COMPREHENSIVE METABOLIC PANEL WITH GFR
ALT: 85 U/L — ABNORMAL HIGH (ref 0–44)
AST: 108 U/L — ABNORMAL HIGH (ref 15–41)
Albumin: 2.3 g/dL — ABNORMAL LOW (ref 3.5–5.0)
Alkaline Phosphatase: 156 U/L — ABNORMAL HIGH (ref 38–126)
Anion gap: 16 — ABNORMAL HIGH (ref 5–15)
BUN: 28 mg/dL — ABNORMAL HIGH (ref 8–23)
CO2: 16 mmol/L — ABNORMAL LOW (ref 22–32)
Calcium: 8.3 mg/dL — ABNORMAL LOW (ref 8.9–10.3)
Chloride: 104 mmol/L (ref 98–111)
Creatinine, Ser: 1.44 mg/dL — ABNORMAL HIGH (ref 0.61–1.24)
GFR, Estimated: 51 mL/min — ABNORMAL LOW (ref >60)
Glucose, Bld: 283 mg/dL — ABNORMAL HIGH (ref 70–99)
Potassium: 4.4 mmol/L (ref 3.5–5.1)
Sodium: 136 mmol/L (ref 135–145)
Total Bilirubin: 0.4 mg/dL (ref 0.0–1.2)
Total Protein: 6.7 g/dL (ref 6.5–8.1)

## 2024-01-03 LAB — I-STAT ARTERIAL BLOOD GAS, ED
Acid-base deficit: 7 mmol/L — ABNORMAL HIGH (ref 0.0–2.0)
Bicarbonate: 20.6 mmol/L (ref 20.0–28.0)
Calcium, Ion: 1.12 mmol/L — ABNORMAL LOW (ref 1.15–1.40)
HCT: 33 % — ABNORMAL LOW (ref 39.0–52.0)
Hemoglobin: 11.2 g/dL — ABNORMAL LOW (ref 13.0–17.0)
O2 Saturation: 100 %
Patient temperature: 97.9
Potassium: 4.8 mmol/L (ref 3.5–5.1)
Sodium: 134 mmol/L — ABNORMAL LOW (ref 135–145)
TCO2: 22 mmol/L (ref 22–32)
pCO2 arterial: 48.8 mmHg — ABNORMAL HIGH (ref 32–48)
pH, Arterial: 7.232 — ABNORMAL LOW (ref 7.35–7.45)
pO2, Arterial: 493 mmHg — ABNORMAL HIGH (ref 83–108)

## 2024-01-03 LAB — I-STAT VENOUS BLOOD GAS, ED
Acid-base deficit: 11 mmol/L — ABNORMAL HIGH (ref 0.0–2.0)
Bicarbonate: 17.6 mmol/L — ABNORMAL LOW (ref 20.0–28.0)
Calcium, Ion: 1.08 mmol/L — ABNORMAL LOW (ref 1.15–1.40)
HCT: 33 % — ABNORMAL LOW (ref 39.0–52.0)
Hemoglobin: 11.2 g/dL — ABNORMAL LOW (ref 13.0–17.0)
O2 Saturation: 94 %
Potassium: 4.4 mmol/L (ref 3.5–5.1)
Sodium: 137 mmol/L (ref 135–145)
TCO2: 19 mmol/L — ABNORMAL LOW (ref 22–32)
pCO2, Ven: 49.6 mmHg (ref 44–60)
pH, Ven: 7.158 — CL (ref 7.25–7.43)
pO2, Ven: 91 mmHg — ABNORMAL HIGH (ref 32–45)

## 2024-01-03 LAB — RAPID URINE DRUG SCREEN, HOSP PERFORMED
Amphetamines: NOT DETECTED
Barbiturates: NOT DETECTED
Benzodiazepines: NOT DETECTED
Cocaine: NOT DETECTED
Opiates: NOT DETECTED
Tetrahydrocannabinol: POSITIVE — AB

## 2024-01-03 LAB — TSH: TSH: 9.122 u[IU]/mL — ABNORMAL HIGH (ref 0.350–4.500)

## 2024-01-03 LAB — ACETAMINOPHEN LEVEL: Acetaminophen (Tylenol), Serum: <10 ug/mL — ABNORMAL LOW (ref 10–30)

## 2024-01-03 LAB — SALICYLATE LEVEL: Salicylate Lvl: <7 mg/dL — ABNORMAL LOW (ref 7.0–30.0)

## 2024-01-03 LAB — BRAIN NATRIURETIC PEPTIDE: B Natriuretic Peptide: 609 pg/mL — ABNORMAL HIGH (ref 0.0–100.0)

## 2024-01-03 LAB — HEMOGLOBIN A1C
Hgb A1c MFr Bld: 6 % — ABNORMAL HIGH (ref 4.8–5.6)
Mean Plasma Glucose: 125.5 mg/dL

## 2024-01-03 LAB — CBG MONITORING, ED: Glucose-Capillary: 283 mg/dL — ABNORMAL HIGH (ref 70–99)

## 2024-01-03 LAB — TROPONIN I (HIGH SENSITIVITY): Troponin I (High Sensitivity): 844 ng/L (ref <18)

## 2024-01-03 MED ORDER — ORAL CARE MOUTH RINSE
15.0000 mL | OROMUCOSAL | Status: DC | PRN
Start: 2024-01-03 — End: 2024-01-05

## 2024-01-03 MED ORDER — INSULIN ASPART 100 UNIT/ML IJ SOLN
0.0000 [IU] | INTRAMUSCULAR | Status: DC
  Administered 2024-01-04: 8 [IU] via SUBCUTANEOUS
  Administered 2024-01-04: 3 [IU] via SUBCUTANEOUS
  Administered 2024-01-04: 5 [IU] via SUBCUTANEOUS
  Administered 2024-01-04: 8 [IU] via SUBCUTANEOUS
  Administered 2024-01-04 (×2): 3 [IU] via SUBCUTANEOUS
  Administered 2024-01-05: 2 [IU] via SUBCUTANEOUS
  Administered 2024-01-05: 15 [IU] via SUBCUTANEOUS
  Administered 2024-01-05: 2 [IU] via SUBCUTANEOUS

## 2024-01-03 MED ORDER — NOREPINEPHRINE 4 MG/250ML-% IV SOLN
0.0000 ug/min | INTRAVENOUS | Status: DC
  Administered 2024-01-03: 10 ug/min via INTRAVENOUS
  Administered 2024-01-04: 5 ug/min via INTRAVENOUS
  Administered 2024-01-04: 14 ug/min via INTRAVENOUS
  Administered 2024-01-04: 8 ug/min via INTRAVENOUS
  Administered 2024-01-05: 14 ug/min via INTRAVENOUS
  Administered 2024-01-05: 15 ug/min via INTRAVENOUS
  Administered 2024-01-05: 17 ug/min via INTRAVENOUS
  Filled 2024-01-03 (×6): qty 250

## 2024-01-03 MED ORDER — POLYETHYLENE GLYCOL 3350 17 G PO PACK: 17.0000 g | PACK | Freq: Every day | ORAL | Status: DC | PRN

## 2024-01-03 MED ORDER — FENTANYL CITRATE PF 50 MCG/ML IJ SOSY
25.0000 ug | PREFILLED_SYRINGE | Freq: Once | INTRAMUSCULAR | Status: AC
  Administered 2024-01-03: 50 ug via INTRAVENOUS

## 2024-01-03 MED ORDER — ORAL CARE MOUTH RINSE
15.0000 mL | OROMUCOSAL | Status: DC
  Administered 2024-01-04 – 2024-01-05 (×18): 15 mL via OROMUCOSAL

## 2024-01-03 MED ORDER — FENTANYL BOLUS VIA INFUSION
25.0000 ug | INTRAVENOUS | Status: DC | PRN
  Administered 2024-01-03 (×2): 100 ug via INTRAVENOUS
  Administered 2024-01-03 – 2024-01-04 (×2): 50 ug via INTRAVENOUS

## 2024-01-03 MED ORDER — FAMOTIDINE 20 MG PO TABS: 20.0000 mg | ORAL_TABLET | ORAL | Status: DC

## 2024-01-03 MED ORDER — FENTANYL 2500MCG IN NS 250ML (10MCG/ML) PREMIX INFUSION
0.0000 ug/h | INTRAVENOUS | Status: DC
  Administered 2024-01-03: 100 ug/h via INTRAVENOUS
  Administered 2024-01-04: 300 ug/h via INTRAVENOUS
  Filled 2024-01-03 (×3): qty 250

## 2024-01-03 MED ORDER — CHLORHEXIDINE GLUCONATE CLOTH 2 % EX PADS
6.0000 | MEDICATED_PAD | Freq: Every day | CUTANEOUS | Status: DC
  Administered 2024-01-04 – 2024-01-05 (×3): 6 via TOPICAL

## 2024-01-03 MED ORDER — NOREPINEPHRINE BITARTRATE 1 MG/ML IV SOLN
INTRAVENOUS | Status: AC | PRN
  Administered 2024-01-03 (×2): 10 ug via INTRAVENOUS

## 2024-01-03 MED ORDER — DOCUSATE SODIUM 50 MG/5ML PO LIQD: 100.0000 mg | Freq: Two times a day (BID) | ORAL | Status: DC | PRN

## 2024-01-03 NOTE — ED Provider Notes (Signed)
 [place] EMERGENCY DEPARTMENT AT [facility] Provider Note   CSN: [id number] Arrival date & time: 01/03/24  2110     Patient presents with: ROSC   Phillip Rich is a 73 y.o. male.  {Add pertinent medical, surgical, social history, OB history to HPI:32947} HPI     Prior to Admission medications   Medication Sig Start Date End Date Taking? Authorizing Provider  albuterol  (VENTOLIN  HFA) 108 (90 Base) MCG/ACT inhaler Inhale 1-2 puffs into the lungs every 6 (six) hours as needed for wheezing or shortness of breath. 01/05/22   Kingsley, Victoria K, DO  atorvastatin  (LIPITOR ) 80 MG tablet Take 1 tablet (80 mg total) by mouth daily. 09/09/22 01/28/24  Sabharwal, Ria, DO  Blood Glucose Monitoring Suppl (ONETOUCH VERIO FLEX SYSTEM) w/Device KIT Use to check blood sugar for 365 days twice a day DX: E11.65 04/08/23   [provider]  Cholecalciferol  (VITAMIN D3 MAXIMUM STRENGTH) 125 MCG (5000 UT) capsule Take 5,000 Units by mouth daily.    [provider]  dextromethorphan  (DELSYM ) 30 MG/5ML liquid Take 60 mg by mouth as needed for cough.    [provider]  digoxin  (LANOXIN ) 0.125 MG tablet Take 1 tablet (125 mcg total) by mouth daily. 10/19/23   Sabharwal, [name], DO  DULoxetine  (CYMBALTA ) 60 MG capsule Take 60 mg by mouth daily. 05/28/21   [provider]  empagliflozin  (JARDIANCE ) 10 MG TABS tablet Take 1 tablet (10 mg total) by mouth daily before breakfast. 07/03/23   Sabharwal, [name], DO  eplerenone  (INSPRA ) 25 MG tablet Take 0.5 tablets (12.5 mg total) by mouth daily. 07/03/23   Sabharwal, [name], DO  Lancets (ONETOUCH DELICA PLUS LANCET33G) MISC Apply topically 2 (two) times daily. 04/14/23   [provider]  levocetirizine (XYZAL) 5 MG tablet Take 5 mg by mouth daily.    [provider]  Melatonin 10 MG TABS Take 10 mg by mouth at bedtime as needed (Sleep).    [provider]  metFORMIN (GLUCOPHAGE) 1000 MG tablet  Take 1,000 mg by mouth 2 (two) times daily.  03/06/10   [provider]  milrinone  (PRIMACOR ) 20 MG/100 ML SOLN infusion Inject 0.0182 mg/min into the vein continuous. 11/10/23   Sabharwal, [name], DO  naproxen sodium (ALEVE) 220 MG tablet Take 440 mg by mouth daily as needed (pain).    [provider]  omeprazole (PRILOSEC) 20 MG capsule Take 1 tablet by mouth every morning.  08/06/10   [provider]  AISHA SINKS test strip Check blood sugar In Vitro twice a day DX: E11.65 for 100 days 04/08/23   [provider]  sacubitril -valsartan  (ENTRESTO ) 24-26 MG Take 1 tablet by mouth 2 (two) times daily. 1 tablet in the am and 1/2 tablet in the evening 10/19/23   Sabharwal, [name], DO  Semaglutide,0.25 or 0.5MG /DOS, (OZEMPIC, 0.25 OR 0.5 MG/DOSE,) 2 MG/3ML SOPN Inject 0.5 mg into the skin once a week.    [provider]  torsemide  (DEMADEX ) 20 MG tablet Take 20 mg by mouth daily.    [provider]  vitamin B-12 (CYANOCOBALAMIN) 500 MCG tablet Take 500 mcg by mouth daily.    [provider]  zolpidem  (AMBIEN ) 10 MG tablet Take 10 mg by mouth at bedtime as needed for sleep.    [provider]    Allergies: Codeine, Crestor [rosuvastatin calcium ], and Erythromycin    Review of Systems  Updated Vital Signs BP (!) 113/95   Pulse (!) 51   Resp  19   SpO2 98%   Physical Exam  (all labs ordered are listed, but only abnormal results are displayed) Labs Reviewed - No data to display  EKG: None  Radiology: No results found.  {Document cardiac monitor, telemetry assessment procedure when appropriate:32947} Procedures   Medications Ordered in the ED  norepinephrine  (LEVOPHED ) injection (10 mcg Intravenous Given 01/03/24 2123)  fentaNYL  (SUBLIMAZE ) injection 25-50 mcg (has no administration in time range)  fentaNYL  in NS 250mL (10mcg/ml) infusion-PREMIX (100 mcg/hr Intravenous New Bag/Given 01/03/24 2126)  fentaNYL   (SUBLIMAZE ) bolus via infusion 25-100 mcg (has no administration in time range)  norepinephrine  (LEVOPHED ) 4mg  in (0.016 mg/mL) premix infusion (10 mcg/min Intravenous New Bag/Given 01/03/24 2126)      {Click here for ABCD2, HEART and other calculators REFRESH Note before signing:1}                              Medical Decision Making Amount and/or Complexity of Data Reviewed Radiology: ordered.  Risk Prescription drug management.   ***  {Document critical care time when appropriate  Document review of labs and clinical decision tools ie CHADS2VASC2, etc  Document your independent review of radiology images and any outside records  Document your discussion with family members, caretakers and with consultants  Document social determinants of health affecting pt's care  Document your decision making why or why not admission, treatments were needed:32947:::1}   Final diagnoses:  None    ED Discharge Orders     None

## 2024-01-03 NOTE — ED Notes (Signed)
 Patient arrived with Milrinone  infusing.

## 2024-01-03 NOTE — Progress Notes (Signed)
 Pt transported via ventilator by RN x2  and RT to 2H. No apparent complications before, and during transport.

## 2024-01-03 NOTE — H&P (Incomplete)
 NAME:  Phillip Rich, MRN:  [id number], DOB:  [birth year], LOS: 0 ADMISSION DATE:  01/03/2024, CONSULTATION DATE:  01/03/2024 REFERRING MD:  Lavanda Bolster, MD, CHIEF COMPLAINT:  Cardiac arrest  History of Present Illness:  73 y/o male with PMH for COPD not on home O2-still smoking, CAD, DMT2, HTN, Lung nodules, Renal mass s/p nephrectomy, PE off anticoagulation, HFrEF-20%, In late 2024 he was put on IV Milrinone  and evaluated for LVAD.  However, secondary to his severe lung disease and severely decreased DLCO, he was not a candidate for LVAD.  Milrinone  has significantly improved his quality of life.  Above h/x from chart review. Patient apparently was found unresponsive in his car at [employer]. [employer] started CPR.  He received shocks x 2 and found to be in V fib, 3 more shocks given, 300mg  Amiodarone given at scene along with epi x 3 and he was intubated in the field after being given 25mcg Fentanyl  and 2.5 mg Versed .   ABG PH 7.232, trop 844, WBC 23.3, TSH 9.122. It seems as though he was a DNR but got CPR inadvertently. Pertinent  Medical History  COPD, CAD, DMT2, HTN, Renal mass s/p nephrectomy, PE, HFrEF-20%, Lung nodules  Significant Hospital Events: Including procedures, antibiotic start and stop dates in addition to other pertinent events   8/24: admit to ICU  Interim History / Subjective:  N/a  Objective    Blood pressure 117/79, pulse (!) 102, temperature 97.9 F (36.6 C), resp. rate (!) 22, SpO2 100%.    Vent Mode: PRVC FiO2 (%):  [100 %] 100 % Set Rate:  [24 bmp] 24 bmp Vt Set:  [560 mL] 560 mL PEEP:  [5 cmH20] 5 cmH20 Plateau Pressure:  [18 cmH20] 18 cmH20  No intake or output data in the 24 hours ending 01/03/24 2346 There were no vitals filed for this visit.  Examination: General: sedated on mech vent HENT: PERRLA no icterus ETT in place Lungs: Diminished b/l n rales no wheezes Cardiovascular: reg s1s2 no murmurs or gallops, diminished heart sunds Abdomen: soft nd  nt bs pos pos Extremities: cool to touch but no cyanosis, clubbing or edema Neuro: sedated and intubated GU: Foley  Resolved problem list   Assessment and Plan  V fib cardiac arrest NSTEMI Cardiomyopathy EF 20% on IV Milranone COPD  H/o PE not on anticoagulaiton AKI   Best Practice (right click and Reselect all SmartList Selections daily)   Diet/type: NPO DVT prophylaxis prophylactic heparin   Pressure ulcer(s): N/A GI prophylaxis: H2B Lines: Central line Foley:  Yes, and it is still needed Code Status:  DNR   Labs   CBC: Recent Labs  Lab 01/03/24 2131 01/03/24 2138 01/03/24 2247  WBC 23.3*  --   --   NEUTROABS 14.4*  --   --   HGB 10.5* 11.2* 11.2*  HCT 35.3* 33.0* 33.0*  MCV 96.7  --   --   PLT 332  --   --     Basic Metabolic Panel: Recent Labs  Lab 01/03/24 2131 01/03/24 2138 01/03/24 2247  NA 136 137 134*  K 4.4 4.4 4.8  CL 104  --   --   CO2 16*  --   --   GLUCOSE 283*  --   --   BUN 28*  --   --   CREATININE 1.44*  --   --   CALCIUM  8.3*  --   --   MG 2.1  --   --    GFR:  Estimated Creatinine Clearance: 45.7 mL/min (A) (by C-G formula based on SCr of 1.44 mg/dL (H)). Recent Labs  Lab 01/03/24 2131  WBC 23.3*    Liver Function Tests: Recent Labs  Lab 01/03/24 2131  AST 108*  ALT 85*  ALKPHOS 156*  BILITOT 0.4  PROT 6.7  ALBUMIN 2.3*   No results for input(s): LIPASE, AMYLASE in the last 168 hours. No results for input(s): AMMONIA in the last 168 hours.  ABG    Component Value Date/Time   PHART 7.232 (L) 01/03/2024 2247   PCO2ART 48.8 (H) 01/03/2024 2247   PO2ART 493 (H) 01/03/2024 2247   HCO3 20.6 01/03/2024 2247   TCO2 22 01/03/2024 2247   ACIDBASEDEF 7.0 (H) 01/03/2024 2247   O2SAT 100 01/03/2024 2247     Coagulation Profile: No results for input(s): INR, PROTIME in the last 168 hours.  Cardiac Enzymes: No results for input(s): CKTOTAL, CKMB, CKMBINDEX, TROPONINI in the last 168  hours.  HbA1C: Hgb A1c MFr Bld  Date/Time Value Ref Range Status  01/03/2024 11:24 PM 6.0 (H) 4.8 - 5.6 % Final    Comment:    (NOTE) Diagnosis of Diabetes The following HbA1c ranges recommended by the American Diabetes Association (ADA) may be used as an aid in the diagnosis of diabetes mellitus.  Hemoglobin             Suggested A1C NGSP%              Diagnosis  <5.7                   Non Diabetic  5.7-6.4                Pre-Diabetic  >6.4                   Diabetic  <7.0                   Glycemic control for                       adults with diabetes.    01/29/2023 02:59 AM 6.2 (H) 4.8 - 5.6 % Final    Comment:    (NOTE) Pre diabetes:          5.7%-6.4%  Diabetes:              >6.4%  Glycemic control for   <7.0% adults with diabetes     CBG: Recent Labs  Lab 01/03/24 2124  GLUCAP 283*    Review of Systems:   Intubated unable to perform  Past Medical History:  He,  has a past medical history of Anxiety, BPH (benign prostatic hyperplasia), Complication of anesthesia, Diabetes mellitus, DJD (degenerative joint disease), GERD (gastroesophageal reflux disease), HTN (hypertension), Hyperlipidemia, ILD (interstitial lung disease) (HCC), Left renal mass, Melanoma (HCC), Mild sleep apnea, and PONV (postoperative nausea and vomiting).   Surgical History:   Past Surgical History:  Procedure Laterality Date  . COLONOSCOPY WITH PROPOFOL  N/A 04/03/2014   Procedure: COLONOSCOPY WITH PROPOFOL ;  Surgeon: Gladis MARLA Louder, MD;  Location: [facility];  Service: Endoscopy;  Laterality: N/A;  . IR FLUORO GUIDE CV LINE RIGHT  02/04/2023  . IR US  GUIDE VASC ACCESS RIGHT  02/04/2023  . KNEE ARTHROSCOPY Right    x2 right, x5 left  . LASIK    . MELANOMA EXCISION Left   . NEPHRECTOMY Left 2022   partial  . PROSTATE ABLATION    .  RIGHT HEART CATH N/A 08/05/2022   Procedure: RIGHT HEART CATH;  Surgeon: Gardenia Led, DO;  Location: [facility]AS[facility] CV [facility];  Service:  Cardiovascular;  Laterality: N/A;  . RIGHT HEART CATH N/A 01/28/2023   Procedure: RIGHT HEART CATH;  Surgeon: Rolan Ezra RAMAN, MD;  Location: [facility] INVASIVE CV LAB;  Service: Cardiovascular;  Laterality: N/A;  . RIGHT HEART CATH N/A 11/10/2023   Procedure: RIGHT HEART CATH;  Surgeon: Gardenia Led, DO;  Location: [facility] CV [facility];  Service: Cardiovascular;  Laterality: N/A;  . RIGHT/LEFT HEART CATH AND CORONARY ANGIOGRAPHY N/A 01/15/2022   Procedure: RIGHT/LEFT HEART CATH AND CORONARY ANGIOGRAPHY;  Surgeon: Darron Deatrice LABOR, MD;  Location: [facility]VAS[facility] CV [facility];  Service: Cardiovascular;  Laterality: N/A;  . TOTAL KNEE ARTHROPLASTY     left  . VASECTOMY       Social History:   reports that he quit smoking about 2 years ago. His smoking use included cigarettes. He started smoking about 52 years ago. He has a 50 pack-year smoking history. He has never used smokeless tobacco. He reports current alcohol  use. He reports current drug use. Drug: Marijuana.   Family History:  His family history includes Asthma in his sister and sister; Breast cancer (age of onset: 29 - 64) in his sister; Colon cancer in his sister; Heart disease in his father; Lung cancer in his mother; Rheum arthritis in his sister.   Allergies Allergies  Allergen Reactions  . Codeine Nausea And Vomiting  . Crestor [Rosuvastatin Calcium ] Other (See Comments)    Leg aches  . Erythromycin Rash    All Mycin drugs     Home Medications  Prior to Admission medications   Medication Sig Start Date End Date Taking? Authorizing Provider  albuterol  (VENTOLIN  HFA) 108 (90 Base) MCG/ACT inhaler Inhale 1-2 puffs into the lungs every 6 (six) hours as needed for wheezing or shortness of breath. 01/05/22   Kingsley, Victoria K, DO  atorvastatin  (LIPITOR ) 80 MG tablet Take 1 tablet (80 mg total) by mouth daily. 09/09/22 01/28/24  Sabharwal, [name], DO  Cholecalciferol  (VITAMIN D3 MAXIMUM STRENGTH) 125 MCG (5000 UT) capsule Take 5,000 Units by  mouth daily.    [provider]  dextromethorphan  (DELSYM ) 30 MG/5ML liquid Take 60 mg by mouth as needed for cough.    [provider]  digoxin  (LANOXIN ) 0.125 MG tablet Take 1 tablet (125 mcg total) by mouth daily. 10/19/23   Sabharwal, [name], DO  DULoxetine  (CYMBALTA ) 60 MG capsule Take 60 mg by mouth daily. 05/28/21   [provider]  empagliflozin  (JARDIANCE ) 10 MG TABS tablet Take 1 tablet (10 mg total) by mouth daily before breakfast. 07/03/23   Sabharwal, [name], DO  eplerenone  (INSPRA ) 25 MG tablet Take 0.5 tablets (12.5 mg total) by mouth daily. 07/03/23   Sabharwal, [name], DO  levocetirizine (XYZAL) 5 MG tablet Take 5 mg by mouth daily.    [provider]  Melatonin 10 MG TABS Take 10 mg by mouth at bedtime as needed (Sleep).    [provider]  metFORMIN (GLUCOPHAGE) 1000 MG tablet Take 1,000 mg by mouth 2 (two) times daily.  03/06/10   [provider]  milrinone  (PRIMACOR ) 20 MG/100 ML SOLN infusion Inject 0.0182 mg/min into the vein continuous. 11/10/23   Sabharwal, [name], DO  naproxen sodium (ALEVE) 220 MG tablet Take 440 mg by mouth daily as needed (pain).    [provider]  omeprazole (PRILOSEC) 20 MG capsule Take 1 tablet by mouth every  morning.  08/06/10   [provider]  sacubitril -valsartan  (ENTRESTO ) 24-26 MG Take 1 tablet by mouth 2 (two) times daily. 1 tablet in the am and 1/2 tablet in the evening 10/19/23   Sabharwal, [name], DO  Semaglutide,0.25 or 0.5MG /DOS, (OZEMPIC, 0.25 OR 0.5 MG/DOSE,) 2 MG/3ML SOPN Inject 0.5 mg into the skin once a week.    [provider]  torsemide  (DEMADEX ) 20 MG tablet Take 20 mg by mouth daily.    [provider]  vitamin B-12 (CYANOCOBALAMIN) 500 MCG tablet Take 500 mcg by mouth daily.    [provider]  zolpidem  (AMBIEN ) 10 MG tablet Take 10 mg by mouth at bedtime as needed for sleep.    [provider]     Critical care time: 67   The  patient is critically ill with multiple organ system failure and requires high complexity decision making for assessment and support, frequent evaluation and titration of therapies, advanced monitoring, review of radiographic studies and interpretation of complex data.   Critical Care Time devoted to patient care services, exclusive of separately billable procedures, described in this note is 36 minutes.   Orlin Fairly, MD [place] [facility] See Amion for pager  If no response to pager , please call [phone] until 7pm After 7:00 pm call Elink  [phone] 01/03/2024, 11:46 PM

## 2024-01-03 NOTE — H&P (Signed)
 NAME:  Phillip Rich, MRN:  [id number], DOB:  [birth year], LOS: 0 ADMISSION DATE:  01/03/2024, CONSULTATION DATE:  01/03/2024 REFERRING MD:  Lavanda Bolster, MD, CHIEF COMPLAINT:  Cardiac arrest  History of Present Illness:  73 y/o male with PMH for ILD, Pulmonary fibrosis, COPD not on home O2-still smoking, CAD, DMT2, HTN, Lung nodules, Renal mass s/p nephrectomy, PE off anticoagulation, HFrEF-20%, In late 2024 he was put on IV Milrinone  and evaluated for LVAD.  However, secondary to his severe lung disease and severely decreased DLCO, he was not a candidate for LVAD.  Milrinone  has significantly improved his quality of life.  Above h/x from chart review. Patient apparently was found unresponsive in his car at [employer]. [employer] started CPR.  He received shocks x 2 and found to be in V fib, 3 more shocks given, 300mg  Amiodarone given at scene along with epi x 3 and he was intubated in the field after being given 25mcg Fentanyl  and 2.5 mg Versed .   ABG PH 7.232, trop 844, WBC 23.3, TSH 9.122. It seems as though he was a DNR but got CPR inadvertently. Pertinent  Medical History  COPD, CAD, DMT2, HTN, Renal mass s/p nephrectomy, PE, HFrEF-20%, Lung nodules  Significant Hospital Events: Including procedures, antibiotic start and stop dates in addition to other pertinent events   8/24: admit to ICU  Interim History / Subjective:  N/a  Objective    Blood pressure 117/79, pulse (!) 102, temperature 97.9 F (36.6 C), resp. rate (!) 22, SpO2 100%.    Vent Mode: PRVC FiO2 (%):  [100 %] 100 % Set Rate:  [24 bmp] 24 bmp Vt Set:  [560 mL] 560 mL PEEP:  [5 cmH20] 5 cmH20 Plateau Pressure:  [18 cmH20] 18 cmH20  No intake or output data in the 24 hours ending 01/03/24 2346 There were no vitals filed for this visit.  Examination: General: sedated on mech vent HENT: PERRLA no icterus ETT in place Lungs: Diminished b/l n rales no wheezes Cardiovascular: reg s1s2 no murmurs or gallops, diminished heart  sounds Abdomen: soft nd nt bs pos pos Extremities: cool to touch but no cyanosis, clubbing or edema Neuro: sedated and intubated GU: Foley  Resolved problem list   Assessment and Plan  V fib cardiac arrest Normothermic therapy Cardiac monitoring Acute Hypoxic respiratory failure Intubated On Mech vent-vent management Monitor VBG/ABGs to help guide vent management NSTEMI Heparin  drip Follow Trops End stage Cardiomyopathy EF 20% on IV Milrinone  Continue Milrinone  Cardiology consult diuresis COPD/ILD/Pulmonary fibrosis Nebs Currently not in exacerbation H/o PE not on anticoagulation Consider CTA AKI monitor Is/Os Check serial Cr Increased WBC Broad spectrum antibiotics for any underlying pneumonia  Apparently he is a DNR, will need to clarify with family, Full code until confirmed  Best Practice (right click and Reselect all SmartList Selections daily)   Diet/type: NPO DVT prophylaxis prophylactic heparin   Pressure ulcer(s): N/A GI prophylaxis: H2B Lines: Central line Foley:  Yes, and it is still needed Code Status:  DNR   Labs   CBC: Recent Labs  Lab 01/03/24 2131 01/03/24 2138 01/03/24 2247  WBC 23.3*  --   --   NEUTROABS 14.4*  --   --   HGB 10.5* 11.2* 11.2*  HCT 35.3* 33.0* 33.0*  MCV 96.7  --   --   PLT 332  --   --     Basic Metabolic Panel: Recent Labs  Lab 01/03/24 2131 01/03/24 2138 01/03/24 2247  NA 136 137 134*  K 4.4 4.4 4.8  CL 104  --   --   CO2 16*  --   --   GLUCOSE 283*  --   --   BUN 28*  --   --   CREATININE 1.44*  --   --   CALCIUM  8.3*  --   --   MG 2.1  --   --    GFR: Estimated Creatinine Clearance: 45.7 mL/min (A) (by C-G formula based on SCr of 1.44 mg/dL (H)). Recent Labs  Lab 01/03/24 2131  WBC 23.3*    Liver Function Tests: Recent Labs  Lab 01/03/24 2131  AST 108*  ALT 85*  ALKPHOS 156*  BILITOT 0.4  PROT 6.7  ALBUMIN 2.3*   No results for input(s): LIPASE, AMYLASE in the last 168  hours. No results for input(s): AMMONIA in the last 168 hours.  ABG    Component Value Date/Time   PHART 7.232 (L) 01/03/2024 2247   PCO2ART 48.8 (H) 01/03/2024 2247   PO2ART 493 (H) 01/03/2024 2247   HCO3 20.6 01/03/2024 2247   TCO2 22 01/03/2024 2247   ACIDBASEDEF 7.0 (H) 01/03/2024 2247   O2SAT 100 01/03/2024 2247     Coagulation Profile: No results for input(s): INR, PROTIME in the last 168 hours.  Cardiac Enzymes: No results for input(s): CKTOTAL, CKMB, CKMBINDEX, TROPONINI in the last 168 hours.  HbA1C: Hgb A1c MFr Bld  Date/Time Value Ref Range Status  01/03/2024 11:24 PM 6.0 (H) 4.8 - 5.6 % Final    Comment:    (NOTE) Diagnosis of Diabetes The following HbA1c ranges recommended by the American Diabetes Association (ADA) may be used as an aid in the diagnosis of diabetes mellitus.  Hemoglobin             Suggested A1C NGSP%              Diagnosis  <5.7                   Non Diabetic  5.7-6.4                Pre-Diabetic  >6.4                   Diabetic  <7.0                   Glycemic control for                       adults with diabetes.    01/29/2023 02:59 AM 6.2 (H) 4.8 - 5.6 % Final    Comment:    (NOTE) Pre diabetes:          5.7%-6.4%  Diabetes:              >6.4%  Glycemic control for   <7.0% adults with diabetes     CBG: Recent Labs  Lab 01/03/24 2124  GLUCAP 283*    Review of Systems:   Intubated unable to perform  Past Medical History:  He,  has a past medical history of Anxiety, BPH (benign prostatic hyperplasia), Complication of anesthesia, Diabetes mellitus, DJD (degenerative joint disease), GERD (gastroesophageal reflux disease), HTN (hypertension), Hyperlipidemia, ILD (interstitial lung disease) (HCC), Left renal mass, Melanoma (HCC), Mild sleep apnea, and PONV (postoperative nausea and vomiting).   Surgical History:   Past Surgical History:  Procedure Laterality Date   COLONOSCOPY WITH PROPOFOL  N/A  04/03/2014   Procedure: COLONOSCOPY WITH PROPOFOL ;  Surgeon:  Gladis MARLA Louder, MD;  Location: [facility];  Service: Endoscopy;  Laterality: N/A;   IR FLUORO GUIDE CV LINE RIGHT  02/04/2023   IR US  GUIDE VASC ACCESS RIGHT  02/04/2023   KNEE ARTHROSCOPY Right    x2 right, x5 left   LASIK     MELANOMA EXCISION Left    NEPHRECTOMY Left 2022   partial   PROSTATE ABLATION     RIGHT HEART CATH N/A 08/05/2022   Procedure: RIGHT HEART CATH;  Surgeon: Gardenia Led, DO;  Location: [facility];  Service: Cardiovascular;  Laterality: N/A;   RIGHT HEART CATH N/A 01/28/2023   Procedure: RIGHT HEART CATH;  Surgeon: Rolan Ezra RAMAN, MD;  Location: [facility] INVASIVE CV LAB;  Service: Cardiovascular;  Laterality: N/A;   RIGHT HEART CATH N/A 11/10/2023   Procedure: RIGHT HEART CATH;  Surgeon: Gardenia Led, DO;  Location: [facility];  Service: Cardiovascular;  Laterality: N/A;   RIGHT/LEFT HEART CATH AND CORONARY ANGIOGRAPHY N/A 01/15/2022   Procedure: RIGHT/LEFT HEART CATH AND CORONARY ANGIOGRAPHY;  Surgeon: Darron Deatrice LABOR, MD;  Location: [facility];  Service: Cardiovascular;  Laterality: N/A;   TOTAL KNEE ARTHROPLASTY     left   VASECTOMY       Social History:   reports that he quit smoking about 2 years ago. His smoking use included cigarettes. He started smoking about 52 years ago. He has a 50 pack-year smoking history. He has never used smokeless tobacco. He reports current alcohol  use. He reports current drug use. Drug: Marijuana.   Family History:  His family history includes Asthma in his sister and sister; Breast cancer (age of onset: 47 - 12) in his sister; Colon cancer in his sister; Heart disease in his father; Lung cancer in his mother; Rheum arthritis in his sister.   Allergies Allergies  Allergen Reactions   Codeine Nausea And Vomiting   Crestor [Rosuvastatin Calcium ] Other (See Comments)    Leg aches   Erythromycin Rash    All Mycin drugs     Home  Medications  Prior to Admission medications   Medication Sig Start Date End Date Taking? Authorizing Provider  albuterol  (VENTOLIN  HFA) 108 (90 Base) MCG/ACT inhaler Inhale 1-2 puffs into the lungs every 6 (six) hours as needed for wheezing or shortness of breath. 01/05/22   Kingsley, Victoria K, DO  atorvastatin  (LIPITOR ) 80 MG tablet Take 1 tablet (80 mg total) by mouth daily. 09/09/22 01/28/24  Sabharwal, [name], DO  Cholecalciferol  (VITAMIN D3 MAXIMUM STRENGTH) 125 MCG (5000 UT) capsule Take 5,000 Units by mouth daily.    [provider]  dextromethorphan  (DELSYM ) 30 MG/5ML liquid Take 60 mg by mouth as needed for cough.    [provider]  digoxin  (LANOXIN ) 0.125 MG tablet Take 1 tablet (125 mcg total) by mouth daily. 10/19/23   Sabharwal, [name], DO  DULoxetine  (CYMBALTA ) 60 MG capsule Take 60 mg by mouth daily. 05/28/21   [provider]  empagliflozin  (JARDIANCE ) 10 MG TABS tablet Take 1 tablet (10 mg total) by mouth daily before breakfast. 07/03/23   Sabharwal, [name], DO  eplerenone  (INSPRA ) 25 MG tablet Take 0.5 tablets (12.5 mg total) by mouth daily. 07/03/23   Sabharwal, [name], DO  levocetirizine (XYZAL) 5 MG tablet Take 5 mg by mouth daily.    [provider]  Melatonin 10 MG TABS Take 10 mg by mouth at bedtime as needed (Sleep).    [provider]  metFORMIN (GLUCOPHAGE) 1000 MG  tablet Take 1,000 mg by mouth 2 (two) times daily.  03/06/10   [provider]  milrinone  (PRIMACOR ) 20 MG/100 ML SOLN infusion Inject 0.0182 mg/min into the vein continuous. 11/10/23   Sabharwal, [name], DO  naproxen sodium (ALEVE) 220 MG tablet Take 440 mg by mouth daily as needed (pain).    [provider]  omeprazole (PRILOSEC) 20 MG capsule Take 1 tablet by mouth every morning.  08/06/10   [provider]  sacubitril -valsartan  (ENTRESTO ) 24-26 MG Take 1 tablet by mouth 2 (two) times daily. 1 tablet in the am and 1/2 tablet in the evening 10/19/23    Sabharwal, [name], DO  Semaglutide,0.25 or 0.5MG /DOS, (OZEMPIC, 0.25 OR 0.5 MG/DOSE,) 2 MG/3ML SOPN Inject 0.5 mg into the skin once a week.    [provider]  torsemide  (DEMADEX ) 20 MG tablet Take 20 mg by mouth daily.    [provider]  vitamin B-12 (CYANOCOBALAMIN) 500 MCG tablet Take 500 mcg by mouth daily.    [provider]  zolpidem  (AMBIEN ) 10 MG tablet Take 10 mg by mouth at bedtime as needed for sleep.    [provider]     Critical care time: 35   The patient is critically ill with multiple organ system failure and requires high complexity decision making for assessment and support, frequent evaluation and titration of therapies, advanced monitoring, review of radiographic studies and interpretation of complex data.   Critical Care Time devoted to patient care services, exclusive of separately billable procedures, described in this note is 36 minutes.   Orlin Fairly, MD [place] [facility] See Amion for pager  If no response to pager , please call [phone] until 7pm After 7:00 pm call Elink  [phone] 01/03/2024, 11:46 PM

## 2024-01-03 NOTE — ED Triage Notes (Addendum)
 Found unresponsive in his car at [employer]. [employer] gave CPR. Patient received 2 shocks. Found in Vfib, gave 3 more shocks. Game 300 Amio on scene and 3 epi./ Patient was intubated in the field. Arrives with IO, 7.0 ETT Tube-26 @lips . Patient was given 25mcg of fent and 2.5 of versed  in field.

## 2024-01-03 NOTE — ED Notes (Addendum)
 [name]

## 2024-01-03 NOTE — ED Notes (Signed)
 Unsuccessful attempt at OG/NG tube

## 2024-01-04 ENCOUNTER — Ambulatory Visit (HOSPITAL_COMMUNITY)

## 2024-01-04 ENCOUNTER — Inpatient Hospital Stay (HOSPITAL_COMMUNITY)

## 2024-01-04 ENCOUNTER — Other Ambulatory Visit: Payer: Self-pay

## 2024-01-04 DIAGNOSIS — I429 Cardiomyopathy, unspecified: Secondary | ICD-10-CM | POA: Diagnosis not present

## 2024-01-04 DIAGNOSIS — R569 Unspecified convulsions: Secondary | ICD-10-CM

## 2024-01-04 DIAGNOSIS — I214 Non-ST elevation (NSTEMI) myocardial infarction: Secondary | ICD-10-CM | POA: Diagnosis not present

## 2024-01-04 DIAGNOSIS — R7989 Other specified abnormal findings of blood chemistry: Secondary | ICD-10-CM | POA: Diagnosis not present

## 2024-01-04 DIAGNOSIS — D72829 Elevated white blood cell count, unspecified: Secondary | ICD-10-CM

## 2024-01-04 DIAGNOSIS — I469 Cardiac arrest, cause unspecified: Secondary | ICD-10-CM

## 2024-01-04 DIAGNOSIS — I5084 End stage heart failure: Secondary | ICD-10-CM | POA: Diagnosis not present

## 2024-01-04 DIAGNOSIS — R579 Shock, unspecified: Secondary | ICD-10-CM | POA: Diagnosis not present

## 2024-01-04 DIAGNOSIS — J441 Chronic obstructive pulmonary disease with (acute) exacerbation: Secondary | ICD-10-CM

## 2024-01-04 DIAGNOSIS — J9601 Acute respiratory failure with hypoxia: Secondary | ICD-10-CM | POA: Diagnosis not present

## 2024-01-04 LAB — CBC
HCT: 35.6 % — ABNORMAL LOW (ref 39.0–52.0)
Hemoglobin: 11.2 g/dL — ABNORMAL LOW (ref 13.0–17.0)
MCH: 28.8 pg (ref 26.0–34.0)
MCHC: 31.5 g/dL (ref 30.0–36.0)
MCV: 91.5 fL (ref 80.0–100.0)
Platelets: 278 K/uL (ref 150–400)
RBC: 3.89 MIL/uL — ABNORMAL LOW (ref 4.22–5.81)
RDW: 16.5 % — ABNORMAL HIGH (ref 11.5–15.5)
WBC: 19 K/uL — ABNORMAL HIGH (ref 4.0–10.5)
nRBC: 0 % (ref 0.0–0.2)

## 2024-01-04 LAB — POCT I-STAT 7, (LYTES, BLD GAS, ICA,H+H)
Acid-base deficit: 7 mmol/L — ABNORMAL HIGH (ref 0.0–2.0)
Bicarbonate: 20.4 mmol/L (ref 20.0–28.0)
Calcium, Ion: 1.15 mmol/L (ref 1.15–1.40)
HCT: 32 % — ABNORMAL LOW (ref 39.0–52.0)
Hemoglobin: 10.9 g/dL — ABNORMAL LOW (ref 13.0–17.0)
O2 Saturation: 98 %
Patient temperature: 36.4
Potassium: 4.8 mmol/L (ref 3.5–5.1)
Sodium: 136 mmol/L (ref 135–145)
TCO2: 22 mmol/L (ref 22–32)
pCO2 arterial: 45.3 mmHg (ref 32–48)
pH, Arterial: 7.258 — ABNORMAL LOW (ref 7.35–7.45)
pO2, Arterial: 118 mmHg — ABNORMAL HIGH (ref 83–108)

## 2024-01-04 LAB — ECHOCARDIOGRAM COMPLETE
AR max vel: 2.44 cm2
AV Peak grad: 5.1 mmHg
Ao pk vel: 1.13 m/s
Area-P 1/2: 5.31 cm2
Est EF: <20
Height: 69.016 in
Weight: 2687.85 [oz_av]

## 2024-01-04 LAB — TROPONIN I (HIGH SENSITIVITY): Troponin I (High Sensitivity): 2235 ng/L (ref <18)

## 2024-01-04 LAB — GLUCOSE, CAPILLARY
Glucose-Capillary: 162 mg/dL — ABNORMAL HIGH (ref 70–99)
Glucose-Capillary: 177 mg/dL — ABNORMAL HIGH (ref 70–99)
Glucose-Capillary: 191 mg/dL — ABNORMAL HIGH (ref 70–99)
Glucose-Capillary: 224 mg/dL — ABNORMAL HIGH (ref 70–99)
Glucose-Capillary: 276 mg/dL — ABNORMAL HIGH (ref 70–99)
Glucose-Capillary: 298 mg/dL — ABNORMAL HIGH (ref 70–99)
Glucose-Capillary: 371 mg/dL — ABNORMAL HIGH (ref 70–99)

## 2024-01-04 LAB — COOXEMETRY PANEL
Carboxyhemoglobin: 2.1 % — ABNORMAL HIGH (ref 0.5–1.5)
Methemoglobin: <0.7 % (ref 0.0–1.5)
O2 Saturation: 69.5 %
Total hemoglobin: 10 g/dL — ABNORMAL LOW (ref 12.0–16.0)

## 2024-01-04 LAB — LACTIC ACID, PLASMA
Lactic Acid, Venous: 1.6 mmol/L (ref 0.5–1.9)
Lactic Acid, Venous: 2.1 mmol/L (ref 0.5–1.9)

## 2024-01-04 LAB — BASIC METABOLIC PANEL WITH GFR
Anion gap: 12 (ref 5–15)
BUN: 38 mg/dL — ABNORMAL HIGH (ref 8–23)
CO2: 19 mmol/L — ABNORMAL LOW (ref 22–32)
Calcium: 8.2 mg/dL — ABNORMAL LOW (ref 8.9–10.3)
Chloride: 104 mmol/L (ref 98–111)
Creatinine, Ser: 1.47 mg/dL — ABNORMAL HIGH (ref 0.61–1.24)
GFR, Estimated: 50 mL/min — ABNORMAL LOW (ref >60)
Glucose, Bld: 303 mg/dL — ABNORMAL HIGH (ref 70–99)
Potassium: 4.5 mmol/L (ref 3.5–5.1)
Sodium: 135 mmol/L (ref 135–145)

## 2024-01-04 LAB — MAGNESIUM: Magnesium: 1.9 mg/dL (ref 1.7–2.4)

## 2024-01-04 LAB — HEPARIN LEVEL (UNFRACTIONATED): Heparin Unfractionated: 0.16 [IU]/mL — ABNORMAL LOW (ref 0.30–0.70)

## 2024-01-04 LAB — MRSA NEXT GEN BY PCR, NASAL: MRSA by PCR Next Gen: NOT DETECTED

## 2024-01-04 LAB — PHOSPHORUS: Phosphorus: 6.7 mg/dL — ABNORMAL HIGH (ref 2.5–4.6)

## 2024-01-04 MED ORDER — DOCUSATE SODIUM 50 MG/5ML PO LIQD
100.0000 mg | Freq: Two times a day (BID) | ORAL | Status: DC
  Administered 2024-01-04 – 2024-01-05 (×2): 100 mg
  Filled 2024-01-04 (×2): qty 10

## 2024-01-04 MED ORDER — ONDANSETRON HCL 4 MG/2ML IJ SOLN
INTRAMUSCULAR | Status: AC
  Administered 2024-01-04: 4 mg via INTRAVENOUS
  Filled 2024-01-04: qty 2

## 2024-01-04 MED ORDER — ASPIRIN 81 MG PO CHEW
81.0000 mg | CHEWABLE_TABLET | Freq: Every day | ORAL | Status: DC
  Administered 2024-01-05: 81 mg
  Filled 2024-01-04: qty 1

## 2024-01-04 MED ORDER — ARFORMOTEROL TARTRATE 15 MCG/2ML IN NEBU
15.0000 ug | INHALATION_SOLUTION | Freq: Two times a day (BID) | RESPIRATORY_TRACT | Status: DC
  Administered 2024-01-04 – 2024-01-05 (×4): 15 ug via RESPIRATORY_TRACT
  Filled 2024-01-04 (×4): qty 2

## 2024-01-04 MED ORDER — MIDAZOLAM HCL 2 MG/2ML IJ SOLN: 2.0000 mg | Freq: Once | INTRAMUSCULAR | Status: AC

## 2024-01-04 MED ORDER — ASPIRIN 325 MG PO TABS
325.0000 mg | ORAL_TABLET | Freq: Once | ORAL | Status: AC
  Administered 2024-01-04: 325 mg
  Filled 2024-01-04: qty 1

## 2024-01-04 MED ORDER — REVEFENACIN 175 MCG/3ML IN SOLN
175.0000 ug | Freq: Every day | RESPIRATORY_TRACT | Status: DC
  Administered 2024-01-04 – 2024-01-05 (×2): 175 ug via RESPIRATORY_TRACT
  Filled 2024-01-04 (×2): qty 3

## 2024-01-04 MED ORDER — ONDANSETRON HCL 4 MG/2ML IJ SOLN: 4.0000 mg | Freq: Once | INTRAMUSCULAR | Status: AC

## 2024-01-04 MED ORDER — MILRINONE LACTATE IN DEXTROSE 20-5 MG/100ML-% IV SOLN
0.1250 ug/kg/min | INTRAVENOUS | Status: DC
  Administered 2024-01-04 – 2024-01-05 (×2): 0.125 ug/kg/min via INTRAVENOUS
  Filled 2024-01-04 (×2): qty 100

## 2024-01-04 MED ORDER — FENTANYL CITRATE PF 50 MCG/ML IJ SOSY
50.0000 ug | PREFILLED_SYRINGE | INTRAMUSCULAR | Status: DC | PRN
  Administered 2024-01-05 (×2): 50 ug via INTRAVENOUS
  Filled 2024-01-04 (×2): qty 1

## 2024-01-04 MED ORDER — HEPARIN BOLUS VIA INFUSION
3500.0000 [IU] | Freq: Once | INTRAVENOUS | Status: AC
  Administered 2024-01-04: 3500 [IU] via INTRAVENOUS
  Filled 2024-01-04: qty 3500

## 2024-01-04 MED ORDER — ALBUTEROL SULFATE (2.5 MG/3ML) 0.083% IN NEBU: 2.5000 mg | INHALATION_SOLUTION | RESPIRATORY_TRACT | Status: DC | PRN

## 2024-01-04 MED ORDER — PERFLUTREN LIPID MICROSPHERE
1.0000 mL | INTRAVENOUS | Status: AC | PRN
  Administered 2024-01-04: 6 mL via INTRAVENOUS

## 2024-01-04 MED ORDER — LEVETIRACETAM (KEPPRA) 500 MG/5 ML ADULT IV PUSH
1000.0000 mg | Freq: Two times a day (BID) | INTRAVENOUS | Status: DC
  Administered 2024-01-04: 1000 mg via INTRAVENOUS
  Filled 2024-01-04: qty 10

## 2024-01-04 MED ORDER — VANCOMYCIN HCL 1500 MG/300ML IV SOLN
1500.0000 mg | Freq: Once | INTRAVENOUS | Status: AC
  Administered 2024-01-04: 1500 mg via INTRAVENOUS
  Filled 2024-01-04: qty 300

## 2024-01-04 MED ORDER — HEPARIN (PORCINE) 25000 UT/250ML-% IV SOLN
1050.0000 [IU]/h | INTRAVENOUS | Status: DC
  Administered 2024-01-04: 900 [IU]/h via INTRAVENOUS
  Administered 2024-01-05: 1050 [IU]/h via INTRAVENOUS
  Filled 2024-01-04 (×2): qty 250

## 2024-01-04 MED ORDER — MIDAZOLAM HCL 2 MG/2ML IJ SOLN
INTRAMUSCULAR | Status: AC
  Administered 2024-01-04: 2 mg via INTRAVENOUS
  Filled 2024-01-04: qty 2

## 2024-01-04 MED ORDER — PANTOPRAZOLE SODIUM 40 MG IV SOLR
40.0000 mg | INTRAVENOUS | Status: DC
  Administered 2024-01-04: 40 mg via INTRAVENOUS
  Filled 2024-01-04: qty 10

## 2024-01-04 MED ORDER — POLYETHYLENE GLYCOL 3350 17 G PO PACK
17.0000 g | PACK | Freq: Every day | ORAL | Status: DC
  Administered 2024-01-04 – 2024-01-05 (×2): 17 g
  Filled 2024-01-04 (×2): qty 1

## 2024-01-04 MED ORDER — SODIUM CHLORIDE 0.9 % IV SOLN
2.0000 g | Freq: Two times a day (BID) | INTRAVENOUS | Status: DC
  Administered 2024-01-04 – 2024-01-05 (×4): 2 g via INTRAVENOUS
  Filled 2024-01-04 (×4): qty 12.5

## 2024-01-04 MED ORDER — PROPOFOL 1000 MG/100ML IV EMUL
0.0000 ug/kg/min | INTRAVENOUS | Status: DC
  Administered 2024-01-04: 40 ug/kg/min via INTRAVENOUS
  Administered 2024-01-04: 20 ug/kg/min via INTRAVENOUS
  Administered 2024-01-05: 30 ug/kg/min via INTRAVENOUS
  Administered 2024-01-05: 35 ug/kg/min via INTRAVENOUS
  Filled 2024-01-04 (×3): qty 100

## 2024-01-04 MED ORDER — MAGNESIUM SULFATE 2 GM/50ML IV SOLN
2.0000 g | Freq: Once | INTRAVENOUS | Status: AC
  Administered 2024-01-04: 2 g via INTRAVENOUS
  Filled 2024-01-04: qty 50

## 2024-01-04 MED ORDER — BUDESONIDE 0.25 MG/2ML IN SUSP
0.2500 mg | Freq: Two times a day (BID) | RESPIRATORY_TRACT | Status: DC
  Administered 2024-01-04 – 2024-01-05 (×4): 0.25 mg via RESPIRATORY_TRACT
  Filled 2024-01-04 (×4): qty 2

## 2024-01-04 MED ORDER — VANCOMYCIN HCL 1250 MG/250ML IV SOLN
1250.0000 mg | INTRAVENOUS | Status: DC
  Administered 2024-01-04: 1250 mg via INTRAVENOUS
  Filled 2024-01-04: qty 250

## 2024-01-04 NOTE — Progress Notes (Addendum)
 Interim CCM Progress Note:  Contacted Emergency Contact- Merilee Mikes. Per Merilee, patient does not have any POA on file, no children, but does have sisters- who would be next of Kin. Merilee working on getting sisters' contact information. Will update beside RN to notify one contact information provided.    ~1600 Obtained Sister's Contact information- Patient's sister Joya), will be traveling overnight into the morning from [place] . Discussed current grim prognosis of patient. Rock did discuss that patient- wishes were to be a DNR and not be on continued long term life support. Rock also aware of patient's poor heart function (EF 20%) - requiring milrinone  infusions. If patient worsens, did discuss with Rock in regards to not escalating care but focusing on comfort measures. Notify Rock, if patient begins to worsen.   Sherlean Sharps AGACNP-BC   [facility] [facility] 01/04/2024, 2:04 PM  Please see [email or web address] for pager details.  From 7A-7P if no response, please call [phone]. After hours, please call ELink [phone].

## 2024-01-04 NOTE — Progress Notes (Signed)
 Pt taken from 2H10 to CT and back via ventilator on 100% and back with no complications.

## 2024-01-04 NOTE — Consult Note (Signed)
 Cardiology Consultation   Patient ID: JASN XIA MRN: [id number]; DOB: [birth year]  Admit date: 01/03/2024 Date of Consult: 01/04/2024  PCP:  Charlott Dorn LABOR, MD   [facility] Providers Cardiologist:  Lynwood Schilling, MD  Electrophysiologist:  OLE ONEIDA HOLTS, MD       Patient Profile: Phillip Rich is a 73 y.o. male with a significant history of end-stage heart failure on palliative milrinone , pulmonary embolism, pulmonary fibrosis, chronic obstructive pulmonary disease, cigarette use,  renal mass status post nephrectomy, who is being seen 01/04/2024 for the evaluation of out of hospital cardiac arrest at the request of Lavanda Bolster.  History of Present Illness: Phillip Rich is currently intubated and unresponsive.  History taken primarily for chart documentation.    Per chart documentation, patient was found unresponsive in his moving car at [employer].  Emergency medical services was called and per documentation his initial rhythm was ventricular fibrillation.  He underwent cardiopulmonary resuscitation requiring defibrillation at least five times (initial shock reportedly went to ventricular tachycardia and second shock to normal sinus rhythm).  He received three rounds of epinephrine  and was given 300 mg IV amiodarone bolus and IV lidocaine  prior to arrival to our hospital.  Patient was intubated in the field.    Per note documentation, initial assessment upon arrival was consistent with cardiogenic shock.  Patient never regained consciousness.  Initial ECG upon arrival revealed sinus rhythm with first degree AV block and RBBB (note on prior ECGs).  Patient was admitted to the cardiac ICU.      Of note, patient has been followed with  Dr. Gardenia for his advance heart failure care.  Per documentation, patient was first diagnosed with heart failure with reduced ejection fraction in 2023.  Work up revealed multivessel coronary artery disease with chronic total  occlusion of the mid-RCA and at least 70% stenosis of the proximal RCA ad distal LCx.  It was ultimately decided to medically manage and pursue advanced heart failure interventions.  He was not a candidate for a left ventricular assist device due to pulmonary fibrosis per documentation.  He was eventually placed on palliative milrinone .  Patient declined ICD placement and became DNR.     Past Medical History:  Diagnosis Date   Anxiety    BPH (benign prostatic hyperplasia)    Complication of anesthesia    Diabetes mellitus    DJD (degenerative joint disease)    GERD (gastroesophageal reflux disease)    HTN (hypertension)    Hyperlipidemia    ILD (interstitial lung disease) (HCC)    Left renal mass    Melanoma (HCC)    Mild sleep apnea    Sleep study pending   PONV (postoperative nausea and vomiting)     Past Surgical History:  Procedure Laterality Date   COLONOSCOPY WITH PROPOFOL  N/A 04/03/2014   Procedure: COLONOSCOPY WITH PROPOFOL ;  Surgeon: Gladis MARLA Louder, MD;  Location: [facility];  Service: Endoscopy;  Laterality: N/A;   IR FLUORO GUIDE CV LINE RIGHT  02/04/2023   IR US  GUIDE VASC ACCESS RIGHT  02/04/2023   KNEE ARTHROSCOPY Right    x2 right, x5 left   LASIK     MELANOMA EXCISION Left    NEPHRECTOMY Left 2022   partial   PROSTATE ABLATION     RIGHT HEART CATH N/A 08/05/2022   Procedure: RIGHT HEART CATH;  Surgeon: Gardenia Led, DO;  Location: [facility] INVASIVE CV LAB;  Service: Cardiovascular;  Laterality: N/A;   RIGHT HEART CATH N/A  01/28/2023   Procedure: RIGHT HEART CATH;  Surgeon: Rolan Ezra RAMAN, MD;  Location: [facility] INVASIVE CV LAB;  Service: Cardiovascular;  Laterality: N/A;   RIGHT HEART CATH N/A 11/10/2023   Procedure: RIGHT HEART CATH;  Surgeon: Gardenia Led, DO;  Location: [facility] INVASIVE CV LAB;  Service: Cardiovascular;  Laterality: N/A;   RIGHT/LEFT HEART CATH AND CORONARY ANGIOGRAPHY N/A 01/15/2022   Procedure: RIGHT/LEFT HEART CATH AND CORONARY ANGIOGRAPHY;   Surgeon: Darron Deatrice LABOR, MD;  Location: [facility] INVASIVE CV LAB;  Service: Cardiovascular;  Laterality: N/A;   TOTAL KNEE ARTHROPLASTY     left   VASECTOMY       Home Medications:  Prior to Admission medications   Medication Sig Start Date End Date Taking? Authorizing Provider  albuterol  (VENTOLIN  HFA) 108 (90 Base) MCG/ACT inhaler Inhale 1-2 puffs into the lungs every 6 (six) hours as needed for wheezing or shortness of breath. 01/05/22   Kingsley, Victoria K, DO  atorvastatin  (LIPITOR ) 80 MG tablet Take 1 tablet (80 mg total) by mouth daily. 09/09/22 01/28/24  Sabharwal, Led, DO  Cholecalciferol  (VITAMIN D3 MAXIMUM STRENGTH) 125 MCG (5000 UT) capsule Take 5,000 Units by mouth daily.    [provider]  dextromethorphan  (DELSYM ) 30 MG/5ML liquid Take 60 mg by mouth as needed for cough.    [provider]  digoxin  (LANOXIN ) 0.125 MG tablet Take 1 tablet (125 mcg total) by mouth daily. 10/19/23   Sabharwal, [name], DO  DULoxetine  (CYMBALTA ) 60 MG capsule Take 60 mg by mouth daily. 05/28/21   [provider]  empagliflozin  (JARDIANCE ) 10 MG TABS tablet Take 1 tablet (10 mg total) by mouth daily before breakfast. 07/03/23   Sabharwal, [name], DO  eplerenone  (INSPRA ) 25 MG tablet Take 0.5 tablets (12.5 mg total) by mouth daily. 07/03/23   Sabharwal, [name], DO  levocetirizine (XYZAL) 5 MG tablet Take 5 mg by mouth daily.    [provider]  Melatonin 10 MG TABS Take 10 mg by mouth at bedtime as needed (Sleep).    [provider]  metFORMIN (GLUCOPHAGE) 1000 MG tablet Take 1,000 mg by mouth 2 (two) times daily.  03/06/10   [provider]  milrinone  (PRIMACOR ) 20 MG/100 ML SOLN infusion Inject 0.0182 mg/min into the vein continuous. 11/10/23   Sabharwal, [name], DO  naproxen sodium (ALEVE) 220 MG tablet Take 440 mg by mouth daily as needed (pain).    [provider]  omeprazole (PRILOSEC) 20 MG capsule Take 1 tablet by mouth every morning.   08/06/10   [provider]  sacubitril -valsartan  (ENTRESTO ) 24-26 MG Take 1 tablet by mouth 2 (two) times daily. 1 tablet in the am and 1/2 tablet in the evening 10/19/23   Sabharwal, [name], DO  Semaglutide,0.25 or 0.5MG /DOS, (OZEMPIC, 0.25 OR 0.5 MG/DOSE,) 2 MG/3ML SOPN Inject 0.5 mg into the skin once a week.    [provider]  torsemide  (DEMADEX ) 20 MG tablet Take 20 mg by mouth daily.    [provider]  vitamin B-12 (CYANOCOBALAMIN) 500 MCG tablet Take 500 mcg by mouth daily.    [provider]  zolpidem  (AMBIEN ) 10 MG tablet Take 10 mg by mouth at bedtime as needed for sleep.    [provider]    Scheduled Meds:  arformoterol   15 mcg Nebulization BID   budesonide  (PULMICORT ) nebulizer solution  0.25 mg Nebulization BID   Chlorhexidine  Gluconate Cloth  6 each Topical Daily   famotidine   20 mg Per Tube Q24H  insulin  aspart  0-15 Units Subcutaneous Q4H   levETIRAcetam   1,000 mg Intravenous Q12H   mouth rinse  15 mL Mouth Rinse Q2H   Continuous Infusions:  ceFEPime  (MAXIPIME ) IV Stopped (01/04/24 0140)   fentaNYL  infusion INTRAVENOUS 300 mcg/hr (01/04/24 0400)   milrinone  0.125 mcg/kg/min (01/04/24 0400)   norepinephrine  (LEVOPHED ) Adult infusion 5 mcg/min (01/04/24 0400)   vancomycin      PRN Meds: albuterol , docusate, fentaNYL , mouth rinse, polyethylene glycol  Allergies:    Allergies  Allergen Reactions   Codeine Nausea And Vomiting   Crestor [Rosuvastatin Calcium ] Other (See Comments)    Leg aches   Erythromycin Rash    All Mycin drugs    Social History:   Social History   Socioeconomic History   Marital status: Single    Spouse name: Not on file   Number of children: 0   Years of education: Not on file   Highest education level: High school graduate  Occupational History   Occupation: retired  Tobacco Use   Smoking status: Former    Current packs/day: 0.00    Average packs/day: 1 pack/day for 50.0 years (50.0 ttl  pk-yrs)    Types: Cigarettes    Start date: 10/17/1971    Quit date: 10/16/2021    Years since quitting: 2.2   Smokeless tobacco: Never  Vaping Use   Vaping status: Never Used  Substance and Sexual Activity   Alcohol  use: Yes    Comment: rare occ.   Drug use: Yes    Types: Marijuana    Comment: 07/29/21 3 x week to help sleep   Sexual activity: Not on file  Other Topics Concern   Not on file  Social History Narrative   Lives alone.  Divorced.  Retired Engineer, technical sales.       Social Drivers of Health   Financial Resource Strain: Medium Risk (01/16/2022)   Overall Financial Resource Strain (CARDIA)    Difficulty of Paying Living Expenses: Somewhat hard  Food Insecurity: No Food Insecurity (01/28/2023)   Hunger Vital Sign    Worried About Running Out of Food in the Last Year: Never true    Ran Out of Food in the Last Year: Never true  Transportation Needs: No Transportation Needs (01/28/2023)   PRAPARE - Administrator, Civil Service (Medical): No    Lack of Transportation (Non-Medical): No  Physical Activity: Not on file  Stress: Not on file  Social Connections: Not on file  Intimate Partner Violence: Not At Risk (01/28/2023)   Humiliation, Afraid, Rape, and Kick questionnaire    Fear of Current or Ex-Partner: No    Emotionally Abused: No    Physically Abused: No    Sexually Abused: No    Family History:   Family History  Problem Relation Age of Onset   Lung cancer Mother    Heart disease Father    Breast cancer Sister 21 - 6   Asthma Sister    Asthma Sister    Colon cancer Sister    Rheum arthritis Sister        x3     ROS:  Please see the history of present illness.  All other ROS reviewed and negative.     Physical Exam/Data: Vitals:   01/04/24 0100 01/04/24 0200 01/04/24 0300 01/04/24 0400  BP:      Pulse: 100 99 90 93  Resp: (!) 23 (!) 24 (!) 28 (!) 28  Temp: 97.7 F (36.5 C) (!) 97.5 F (36.4  C) (!) 97.5 F (36.4 C) (!) 97.2 F (36.2 C)   TempSrc:    Bladder  SpO2: 99% 99% 98% 97%  Weight:      Height:        Intake/Output Summary (Last 24 hours) at 01/04/2024 0413 Last data filed at 01/04/2024 0400 Gross per 24 hour  Intake 754.08 ml  Output 225 ml  Net 529.08 ml      01/04/2024   12:00 AM 12/30/2023    3:10 PM 11/23/2023   12:23 PM  Last 3 Weights  Weight (lbs) 167 lb 15.9 oz 168 lb 162 lb  Weight (kg) 76.2 kg 76.204 kg 73.483 kg     Body mass index is 24.8 kg/m.  General:  Well nourished, well developed, unresponsive HEENT: normal Neck: no JVD Vascular: No carotid bruits; Distal pulses 2+ bilaterally Cardiac:  normal S1, S2; RRR; no murmur/gallops/rubs Lungs:  clear to auscultation bilaterally, no wheezing, rhonchi or rales  Abd: soft, nontender, no hepatomegaly  Ext: no edema Musculoskeletal:  No deformities, BUE and BLE strength normal and equal Skin: warm and dry  Neuro:  CNs 2-12 intact, no focal abnormalities noted Psych:  Normal affect   EKG:  The EKG was personally reviewed and demonstrates:  01/03/24 (21:15):  Sinus rhythm; IVCD; cannot rule out septal infarct; Telemetry:  Telemetry was personally reviewed and demonstrates:  sinus rhythm  Relevant CV Studies: # Echocardiogram 01/29/23: IMPRESSIONS   1. There is a 1.49 X 1.56 hyper echodensity seen in the to mid basal  inferior and inferoseptal wall. In the seeting of regional wall motion  abnormality, this is concerning for thrombus. Left ventricular ejection  fraction, by estimation, is 20 to 25%.  The left ventricle has severely decreased function. The left ventricle  demonstrates regional wall motion abnormalities (see scoring  diagram/findings for description). There is mild concentric left  ventricular hypertrophy. Left ventricular diastolic  parameters are indeterminate.   2. Right ventricular systolic function is normal. The right ventricular  size is moderately enlarged.   3. Left atrial size was mildly dilated.   4. The mitral  valve is normal in structure. Mild to moderate mitral valve  regurgitation.   5. Multiple jets.   6. The aortic valve is tricuspid. There is mild thickening of the aortic  valve. Aortic valve regurgitation is not visualized. No aortic stenosis is  present.   # Coronary angiography with right heart catheterization 01/15/22:   Ost LM lesion is 20% stenosed.   Prox Cx to Dist Cx lesion is 70% stenosed.   Prox LAD to Mid LAD lesion is 30% stenosed.   Mid RCA lesion is 100% stenosed.   Prox RCA lesion is 70% stenosed. 1.  Significant two-vessel coronary artery disease with chronically occluded mid right coronary artery with left to right as well as bridging collaterals.  The left circumflex is tortuous and diffusely diseased in the midsegment. 2.  Left ventricular angiography was not performed.  EF was severely reduced by echo. 3.  Right heart catheterization showed significantly elevated right and left-sided filling pressures, moderate to severe pulmonary hypertension and moderately reduced cardiac output.  Laboratory Data: High Sensitivity Troponin:   Recent Labs  Lab 01/03/24 2131 01/03/24 2324  TROPONINIHS 844* 2,235*     Chemistry Recent Labs  Lab 01/03/24 2131 01/03/24 2138 01/03/24 2247 01/04/24 0127 01/04/24 0249  NA 136   < > 134* 135 136  K 4.4   < > 4.8 4.5 4.8  CL 104  --   --  104  --   CO2 16*  --   --  19*  --   GLUCOSE 283*  --   --  303*  --   BUN 28*  --   --  38*  --   CREATININE 1.44*  --   --  1.47*  --   CALCIUM  8.3*  --   --  8.2*  --   MG 2.1  --   --  1.9  --   GFRNONAA 51*  --   --  50*  --   ANIONGAP 16*  --   --  12  --    < > = values in this interval not displayed.    Recent Labs  Lab 01/03/24 2131  PROT 6.7  ALBUMIN 2.3*  AST 108*  ALT 85*  ALKPHOS 156*  BILITOT 0.4   Lipids No results for input(s): CHOL, TRIG, HDL, LABVLDL, LDLCALC, CHOLHDL in the last 168 hours.  Hematology Recent Labs  Lab 01/03/24 2131 01/03/24 2138  01/03/24 2247 01/04/24 0127 01/04/24 0249  WBC 23.3*  --   --  19.0*  --   RBC 3.65*  --   --  3.89*  --   HGB 10.5*   < > 11.2* 11.2* 10.9*  HCT 35.3*   < > 33.0* 35.6* 32.0*  MCV 96.7  --   --  91.5  --   MCH 28.8  --   --  28.8  --   MCHC 29.7*  --   --  31.5  --   RDW 16.5*  --   --  16.5*  --   PLT 332  --   --  278  --    < > = values in this interval not displayed.   Thyroid   Recent Labs  Lab 01/03/24 2131  TSH 9.122*  FREET4 0.85    BNP Recent Labs  Lab 01/03/24 2131  BNP 609.0*    DDimer No results for input(s): DDIMER in the last 168 hours.  Radiology/Studies:  DG Chest Portable 1 View Result Date: 01/03/2024 CLINICAL DATA:  cpr, intubation EXAM: PORTABLE CHEST 1 VIEW COMPARISON:  Chest x-ray 01/27/2023. CT chest 08/19/2023 FINDINGS: Endotracheal tube with tip terminating 7.5 cm above the carina. Right dialysis catheter with tip overlying the superior cavoatrial junction. Cardiac paddles are noted. The heart and mediastinal contours are within normal limits. No focal consolidation. Chronic coarsened interstitial markings. No pleural effusion. No pneumothorax. No acute osseous abnormality. IMPRESSION: 1. Endotracheal tube with tip terminating 7.5 cm above the carina. Recommend advancement by 2 cm. 2. Pulmonary fibrosis. Electronically Signed   By: Morgane  Naveau M.D.   On: 01/03/2024 21:44     Assessment and Plan: Cardiac arrest: Status post out of hospital cardiac arrest with presenting rhythm of ventricular fibrillation.  End-stage heart failure likely etiology independent of rising HS-troponin (likely type II myocardial infarction).  Currently hemodynamically stable on milrinone  and very small amount norepinephrine .  Patient not eligible for therapeutic temperature management due terminal illness with limited life expectancy for end-stage heart failure.  He has received IV amiodarone 300 mg in the field.  Telemetry without significant arrhythmias.  Patient is  DNR. --If frequent ectopy occur on telemetry, reasonable to initiate amiodarone drip. --Post cardiac arrest care per intensivist care team.    Shock: Patient reportedly with shock post cardiac arrest upon arrival.  He has remained on his palliative milrinone  and placed on low-dose norephinephrine.   Blood pressure by cuff is not  suggestive of shock. --Reasonable to wean off norepinephrine  if MAPs remain >65 mmHg.  Elevated HS-troponin: Elevated HS-troponin in the setting of out of hospital cardiac arrest.  Likely type II myocardial infarction in the setting of shock from cardiac arrest and known multivessel coronary artery disease, but cannot rule out type I myocardial effusion.  HS-troponin continues to  rise but ECG unchanged from prior.  Head CT pending to rule out cerebral hemorrhage.  Currently DNR status and limited life expectancy. --If head CT is normal, please at least start aspirin .  Consider initiation of heparin  drip for at least 48 hours since type I myocardial infarction cannot be excluded if head CT is normal.  --Given prognosis and DNR status, coronary angiography is not warranted.    End-stage heart failure: Known end-stage heart failure with severely reduced ejection fraction.  Patient was on milrinone  drip at home for palliative measures.  Her now post cardiac arrest. --No change in management.  Guideline directed medical therapies are being held. --Further recommendations per advanced heart failure team.    Coronary artery disease: Known multivessel coronary artery disease. --Acute manage as above for elevated HS-troponin.     Risk Assessment/Risk Scores:       [place]  Heart Association (NYHA) Functional Class NYHA Class IV       For questions or updates, please contact [facility] Please consult [email or web address] for contact info under    Signed, Lue JONETTA Sauers, MD  01/04/2024 4:13 AM

## 2024-01-04 NOTE — Progress Notes (Signed)
 eLink Physician-Brief Progress Note Patient Name: Phillip Rich DOB: Oct 17, 1950 MRN: [id number]   Date of Service  01/04/2024  HPI/Events of Note  Has periods of desaturation when he is coughing on the ventilator no escalation of care, waiting for family to arrive at bedside  Reviewed ACP documents again:   eICU Interventions  Add fentanyl  as needed for comfort and vent synchrony     Intervention Category Minor Interventions: Routine modifications to care plan (e.g. PRN medications for pain, fever)  Phillip Rich 01/04/2024, 11:06 PM

## 2024-01-04 NOTE — Progress Notes (Signed)
 Pharmacy Antibiotic Note  Phillip Rich is a 73 y.o. male admitted on 01/03/2024 with sepsis.  Pharmacy has been consulted for Vancomycin  dosing. WBC is elevated (infective vs reactive s/p cardiac arrest with ROSC). Mild bump in Scr.   Plan: Vancomycin  1250 mg IV q24h >>>Estimated AUC: 540 Cefepime  per MD Trend WBC, temp, renal function  F/U infectious work-up Drug levels as indicated   Height: 5' 9.02 (175.3 cm) Weight: 76.2 kg (167 lb 15.9 oz) IBW/kg (Calculated) : 70.74  Temp (24hrs), Avg:97.9 F (36.6 C), Min:97.5 F (36.4 C), Max:98.1 F (36.7 C)  Recent Labs  Lab 01/03/24 2131  WBC 23.3*  CREATININE 1.44*    Estimated Creatinine Clearance: 45.7 mL/min (A) (by C-G formula based on SCr of 1.44 mg/dL (H)).    Allergies  Allergen Reactions   Codeine Nausea And Vomiting   Crestor [Rosuvastatin Calcium ] Other (See Comments)    Leg aches   Erythromycin Rash    All Mycin drugs   Lynwood Mckusick, PharmD, BCPS Clinical Pharmacist Phone: [phone]

## 2024-01-04 NOTE — Progress Notes (Signed)
 Echocardiogram 2D Echocardiogram has been performed.  Phillip Rich 01/04/2024, 1:10 PM

## 2024-01-04 NOTE — Procedures (Signed)
 Patient Name: Phillip Rich  MRN: [id number]  Epilepsy Attending: Arlin MALVA Krebs  Referring Physician/Provider: Gleason, Leita SAUNDERS, PA-C  Date: 01/04/2024 Duration: 23.10 mins  Patient history: 73 yo M with upward gaze overnight. EEG to evaluate for seizure  Level of alertness: comatose/ lethargic   AEDs during EEG study: LEV  Technical aspects: This EEG study was done with scalp electrodes positioned according to the 10-20 International system of electrode placement. Electrical activity was reviewed with band pass filter of 1-70Hz , sensitivity of 7 uV/mm, display speed of 44mm/sec with a 60Hz  notched filter applied as appropriate. EEG data were recorded continuously and digitally stored.  Video monitoring was available and reviewed as appropriate.  Description: EEG showed continuous generalized 3-5Hz  theta-delta slowing. Hyperventilation and photic stimulation were not performed.     ABNORMALITY - Continuous slow, generalized  IMPRESSION: This study is suggestive of severe diffuse encephalopathy. No seizures or epileptiform discharges were seen throughout the recording.  Raissa Dam O Giabella Duhart

## 2024-01-04 NOTE — Progress Notes (Addendum)
 eLink Physician-Brief Progress Note Patient Name: Phillip Rich DOB: [birth year] MRN: [id number]   Date of Service  01/04/2024  HPI/Events of Note  73 year old male with a history of ILD, COPD, active tobacco use disorder, coronary artery disease and metabolic syndrome status post nephrectomy.  Has heart failure with reduced ejection fraction 20% milrinone  being evaluated for LVAD.  The patient had a out-of-hospital cardiac arrest with shocks noted to be in V-fib for multiple rounds, intubated in the field and brought in for further evaluation.  ACP documents reviewed.     Vital signs consistent with some tachycardia, saturating 99% on 40% FiO2, low-dose norepinephrine  infusing.  Mechanically ventilated.Results consistent with adequate oxygenation, reduced ventilation, anion gap metabolic acidosis with elevated creatinine transaminitis, and troponin elevation.  Leukocytosis present.  Radiograph with high lying endotracheal tube  eICU Interventions  Advance endotracheal tube to 28 cm at the lips  Maintain intraosseous line, remove prior to 24 hours  Maintain mechanical ventilation, daily spontaneous awakening/breathing trials.  Currently not sedated.  Maintain fentanyl  infusion  Maintain norepinephrine  as needed for MAP greater than 65.  Maintain home milrinone   Updated to DNR interventions requested per ACP documents, per documents, MPOA not to override  Broad-spectrum antibiotics  Add SVNs  DVT prophylaxis with SCDs GI prophylaxis with famotidine    0409 - change on status, opens eyes spontaneously, not to voice, upward gaze, slightly disconjugate, still unresponsive to pain, decorticate posturing -EEG pending.  Will add empiric AEDs for now.  CT head to rule out intracranial process  Intervention Category Evaluation Type: New Patient Evaluation  [name] 01/04/2024, 12:29 AM

## 2024-01-04 NOTE — Progress Notes (Signed)
 Routine EEG complete. Results pending.

## 2024-01-04 NOTE — Progress Notes (Signed)
 PHARMACY - ANTICOAGULATION CONSULT NOTE  Pharmacy Consult for heparin  infusion Indication: chest pain/ACS  Allergies  Allergen Reactions   Codeine Nausea And Vomiting   Crestor [Rosuvastatin Calcium ] Other (See Comments)    Leg aches   Erythromycin Rash    All Mycin drugs    Patient Measurements: Height: 5' 9.02 (175.3 cm) Weight: 76.2 kg (167 lb 15.9 oz) IBW/kg (Calculated) : 70.74 HEPARIN  DW (KG): 76.2  Vital Signs: Temp: 97.5 F (36.4 C) (08/25 1000) Temp Source: Bladder (08/25 0800) BP: 79/58 (08/25 0930) Pulse Rate: 69 (08/25 0930)  Labs: Recent Labs    01/03/24 2131 01/03/24 2138 01/03/24 2247 01/03/24 2324 01/04/24 0127 01/04/24 0249  HGB 10.5*   < > 11.2*  --  11.2* 10.9*  HCT 35.3*   < > 33.0*  --  35.6* 32.0*  PLT 332  --   --   --  278  --   CREATININE 1.44*  --   --   --  1.47*  --   TROPONINIHS 844*  --   --  2,235*  --   --    < > = values in this interval not displayed.    Estimated Creatinine Clearance: 44.8 mL/min (A) (by C-G formula based on SCr of 1.47 mg/dL (H)).   Medical History: Past Medical History:  Diagnosis Date   Anxiety    BPH (benign prostatic hyperplasia)    Complication of anesthesia    Diabetes mellitus    DJD (degenerative joint disease)    GERD (gastroesophageal reflux disease)    HTN (hypertension)    Hyperlipidemia    ILD (interstitial lung disease) (HCC)    Left renal mass    Melanoma (HCC)    Mild sleep apnea    Sleep study pending   PONV (postoperative nausea and vomiting)     Medications:  Scheduled:   arformoterol   15 mcg Nebulization BID   budesonide  (PULMICORT ) nebulizer solution  0.25 mg Nebulization BID   Chlorhexidine  Gluconate Cloth  6 each Topical Daily   famotidine   20 mg Per Tube Q24H   insulin  aspart  0-15 Units Subcutaneous Q4H   levETIRAcetam   1,000 mg Intravenous Q12H   mouth rinse  15 mL Mouth Rinse Q2H   Infusions:   ceFEPime  (MAXIPIME ) IV 200 mL/hr at 01/04/24 1000   fentaNYL   infusion INTRAVENOUS 300 mcg/hr (01/04/24 1000)   milrinone  0.125 mcg/kg/min (01/04/24 1000)   norepinephrine  (LEVOPHED ) Adult infusion 4 mcg/min (01/04/24 1000)   vancomycin       Assessment: 73 yo M presenting after out of hospital Vfib arrest. Troponin found to be elevated, concern for NSTEMI. PMH significant for CAD, PE off anticoagulation. Not on anticoagulation PTA. Pharmacy consulted for heparin  management.  Hgb (10.9) and platelets (279) are stable. No signs of bleeding per RN.  Goal of Therapy:  Heparin  level 0.3-0.7 units/ml Monitor platelets by anticoagulation protocol: Yes   Plan:  Heparin  3500 unit bolus x1 Start heparin  infusion at 900 units/hr Check 8 hour heparin  level Monitor daily heparin  level, CBC, and signs/symptoms of bleeding   Thank you for allowing pharmacy to be a part of this patient's care.   Nidia Schaffer, PharmD PGY2 Cardiology Pharmacy Resident  Please check AMION for all [facility] Pharmacy phone numbers After 10:00 PM, call Main Pharmacy [phone] 01/04/2024,10:59 AM

## 2024-01-04 NOTE — Progress Notes (Signed)
 NAME:  Phillip Rich, MRN:  [id number], DOB:  Sep 23, 1950, LOS: 1 ADMISSION DATE:  01/03/2024, CONSULTATION DATE:  01/03/2024 REFERRING MD:  Lavanda Bolster, MD, CHIEF COMPLAINT:  Cardiac arrest  History of Present Illness:  73 y/o male with PMH for ILD, Pulmonary fibrosis, COPD not on home O2-still smoking, CAD, DMT2, HTN, Lung nodules, Renal mass s/p nephrectomy, PE off anticoagulation, HFrEF-20%, In late 2024 he was put on IV Milrinone  and evaluated for LVAD.  However, secondary to his severe lung disease and severely decreased DLCO, he was not a candidate for LVAD.  Milrinone  has significantly improved his quality of life.  Above h/x from chart review. Patient apparently was found unresponsive in his car at [employer]. [employer] started CPR.  He received shocks x 2 and found to be in V fib, 3 more shocks given, 300mg  Amiodarone given at scene along with epi x 3 and he was intubated in the field after being given 25mcg Fentanyl  and 2.5 mg Versed .   ABG PH 7.232, trop 844, WBC 23.3, TSH 9.122. It seems as though he was a DNR but got CPR inadvertently.  Pertinent  Medical History  COPD, CAD, DMT2, HTN, Renal mass s/p nephrectomy, PE, HFrEF-20%, Lung nodules  Significant Hospital Events: Including procedures, antibiotic start and stop dates in addition to other pertinent events   8/24: admit to ICU for out of hospital Vfib Arrest   Interim History / Subjective:  Having upward Gaze overnight, disconjugate, not responsive to pain Noted to also have decorticate posturing overnight  EEG Pending CT of head obtained   Objective    Blood pressure 97/67, pulse 78, temperature (!) 96.8 F (36 C), resp. rate (!) 28, height 5' 9.02 (1.753 m), weight 76.2 kg, SpO2 99%.    Vent Mode: PRVC FiO2 (%):  [40 %-100 %] 40 % Set Rate:  [24 bmp-28 bmp] 28 bmp Vt Set:  [560 mL] 560 mL PEEP:  [5 cmH20] 5 cmH20 Plateau Pressure:  [15 cmH20-18 cmH20] 18 cmH20   Intake/Output Summary (Last 24 hours) at 01/04/2024  0757 Last data filed at 01/04/2024 0700 Gross per 24 hour  Intake 918.26 ml  Output 385 ml  Net 533.26 ml   Filed Weights   01/04/24 0000  Weight: 76.2 kg    Examination: General: acute on chronic ill appearing male, lying in icu bed on vent  HEENT: Normocephalic, PERRLA intact, ETT, OG, Pink MM CV: s1,s2, RRR, no MRG, No JVD  pulm: clear, diminished, no distress on vent  Abs: bs active, soft  Extremities: no edema, no deformity  Skin: no rash  Neuro: Rass -3, sedated, pupils pinpoint- B/L, no response to painful stimuli   GU: foley intact-making urine, yellow   Resolved problem list   Assessment and Plan  V fib cardiac arrest, Out of Hospital  NSTEMI- Troponin 844>>2235 End stage Cardiomyopathy EF 20% on IV Milrinone  Concern for Anoxic Brain Injury- CT of head- Questionable loss of gray-white differentiation in the bilateral deep gray nuclei- possible Anoxic brain injury  P: Continue cardiac telemetry  Continue IV Milrinone  gtt Will add heparin  gtt  Will need to establish goals of care with emergency contact/family  Cardiology consulted and following, appreciate assistance   Acute Hypoxic respiratory failure in setting of Cardiac Arrest  COPD/ILD/Pulmonary fibrosis-Currently not in exacerbation H/o PE not on anticoagulation Last Arterial Blood Gas result:  pO2 118, pCO2 45.3 pH 7.258  HCO3 20.4, %O2 Sat 98%  P:  Continue ventilator support and lung protective strategies  Continue LTVV  Wean PEEP and Fio2 requirements to sat goal of >92%  HOB > 30 degrees Plat < 30  Aim for Driving pressures < 15  Intermittent Chest X-ray and ABGS follow cultures-blood and tracheal aspirate VAP and PAD protocols in place  Wean sedation as tolerated, SBT and WUA daily Continue broad spectrum antibiotics- vanc, cefepime    Leukocytosis  23.3>19.0 P: Continue broad spectrum abx  Obtain MRSA swab Continue follow BC- NGTD   AKI Cr 1.47  P: Continue to trend renal function  daily  Continue to monitor and optimize electrolytes daily Continue to monitor urine output Continue strict I/Os Continue Adequate renal perfusion  Avoid nephrotoxic agents   GOC  Will need to establish GOC with patient's emergency contact Patient chronically ill at baseline, and has out of hospital cardiac arrest with concerns of anoxic brain injury  P: Reach out to emergency contact    Best Practice (right click and Reselect all SmartList Selections daily)   Diet/type: NPO DVT prophylaxis prophylactic heparin   Pressure ulcer(s): N/A GI prophylaxis: H2B Lines: Central line Foley:  Yes, and it is still needed Code Status:  DNR  Critical care time: 45 mins   Christian Huzaifa Viney AGACNP-BC   [facility] 01/04/2024, 10:59 AM  Please see [email or web address] for pager details.  From 7A-7P if no response, please call [phone]. After hours, please call ELink [phone].

## 2024-01-05 DIAGNOSIS — I469 Cardiac arrest, cause unspecified: Secondary | ICD-10-CM | POA: Diagnosis not present

## 2024-01-05 DIAGNOSIS — J441 Chronic obstructive pulmonary disease with (acute) exacerbation: Secondary | ICD-10-CM | POA: Diagnosis not present

## 2024-01-05 DIAGNOSIS — I214 Non-ST elevation (NSTEMI) myocardial infarction: Secondary | ICD-10-CM | POA: Diagnosis not present

## 2024-01-05 DIAGNOSIS — I429 Cardiomyopathy, unspecified: Secondary | ICD-10-CM | POA: Diagnosis not present

## 2024-01-05 DIAGNOSIS — J9601 Acute respiratory failure with hypoxia: Secondary | ICD-10-CM | POA: Diagnosis not present

## 2024-01-05 LAB — BASIC METABOLIC PANEL WITH GFR
Anion gap: 10 (ref 5–15)
BUN: 45 mg/dL — ABNORMAL HIGH (ref 8–23)
CO2: 17 mmol/L — ABNORMAL LOW (ref 22–32)
Calcium: 7.5 mg/dL — ABNORMAL LOW (ref 8.9–10.3)
Chloride: 101 mmol/L (ref 98–111)
Creatinine, Ser: 1.69 mg/dL — ABNORMAL HIGH (ref 0.61–1.24)
GFR, Estimated: 42 mL/min — ABNORMAL LOW (ref >60)
Glucose, Bld: 314 mg/dL — ABNORMAL HIGH (ref 70–99)
Potassium: 3.7 mmol/L (ref 3.5–5.1)
Sodium: 128 mmol/L — ABNORMAL LOW (ref 135–145)

## 2024-01-05 LAB — COOXEMETRY PANEL
Carboxyhemoglobin: 0.8 % (ref 0.5–1.5)
Methemoglobin: <0.7 % (ref 0.0–1.5)
O2 Saturation: 63.4 %
Total hemoglobin: 10.3 g/dL — ABNORMAL LOW (ref 12.0–16.0)

## 2024-01-05 LAB — GLUCOSE, CAPILLARY
Glucose-Capillary: 147 mg/dL — ABNORMAL HIGH (ref 70–99)
Glucose-Capillary: 147 mg/dL — ABNORMAL HIGH (ref 70–99)

## 2024-01-05 LAB — CBC
HCT: 30.5 % — ABNORMAL LOW (ref 39.0–52.0)
Hemoglobin: 9.8 g/dL — ABNORMAL LOW (ref 13.0–17.0)
MCH: 29.2 pg (ref 26.0–34.0)
MCHC: 32.1 g/dL (ref 30.0–36.0)
MCV: 90.8 fL (ref 80.0–100.0)
Platelets: 259 K/uL (ref 150–400)
RBC: 3.36 MIL/uL — ABNORMAL LOW (ref 4.22–5.81)
RDW: 17 % — ABNORMAL HIGH (ref 11.5–15.5)
WBC: 13.7 K/uL — ABNORMAL HIGH (ref 4.0–10.5)
nRBC: 0 % (ref 0.0–0.2)

## 2024-01-05 LAB — TRIGLYCERIDES: Triglycerides: 172 mg/dL — ABNORMAL HIGH (ref <150)

## 2024-01-05 LAB — LACTIC ACID, PLASMA: Lactic Acid, Venous: 1.9 mmol/L (ref 0.5–1.9)

## 2024-01-05 MED ORDER — SODIUM CHLORIDE 0.9 % IV SOLN: INTRAVENOUS | Status: DC

## 2024-01-05 MED ORDER — GLYCOPYRROLATE 0.2 MG/ML IJ SOLN
0.2000 mg | INTRAMUSCULAR | Status: DC | PRN
  Administered 2024-01-05: 0.2 mg via INTRAVENOUS
  Filled 2024-01-05: qty 1

## 2024-01-05 MED ORDER — GLYCOPYRROLATE 0.2 MG/ML IJ SOLN: 0.2000 mg | INTRAMUSCULAR | Status: DC | PRN

## 2024-01-05 MED ORDER — MIDAZOLAM BOLUS VIA INFUSION (WITHDRAWAL LIFE SUSTAINING TX): 2.0000 mg | INTRAVENOUS | Status: DC | PRN

## 2024-01-05 MED ORDER — FUROSEMIDE 10 MG/ML IJ SOLN
60.0000 mg | Freq: Once | INTRAMUSCULAR | Status: AC
  Administered 2024-01-05: 60 mg via INTRAVENOUS
  Filled 2024-01-05: qty 6

## 2024-01-05 MED ORDER — HYDROMORPHONE HCL-NACL 50-0.9 MG/50ML-% IV SOLN
0.0000 mg/h | INTRAVENOUS | Status: DC
  Administered 2024-01-05: 1 mg/h via INTRAVENOUS
  Filled 2024-01-05: qty 50

## 2024-01-05 MED ORDER — POLYVINYL ALCOHOL 1.4 % OP SOLN: 1.0000 [drp] | Freq: Four times a day (QID) | OPHTHALMIC | Status: DC | PRN

## 2024-01-05 MED ORDER — POTASSIUM CHLORIDE 20 MEQ PO PACK
20.0000 meq | PACK | Freq: Once | ORAL | Status: AC
  Administered 2024-01-05: 20 meq
  Filled 2024-01-05: qty 1

## 2024-01-05 MED ORDER — MIDAZOLAM HCL 2 MG/2ML IJ SOLN
1.0000 mg | INTRAMUSCULAR | Status: DC | PRN
  Administered 2024-01-05 (×2): 1 mg via INTRAVENOUS

## 2024-01-05 MED ORDER — ACETAMINOPHEN 650 MG RE SUPP: 650.0000 mg | Freq: Four times a day (QID) | RECTAL | Status: DC | PRN

## 2024-01-05 MED ORDER — ONDANSETRON HCL 4 MG/2ML IJ SOLN
4.0000 mg | Freq: Once | INTRAMUSCULAR | Status: AC
  Administered 2024-01-05: 4 mg via INTRAVENOUS

## 2024-01-05 MED ORDER — ACETAMINOPHEN 325 MG PO TABS
650.0000 mg | ORAL_TABLET | Freq: Four times a day (QID) | ORAL | Status: DC | PRN
Start: 2024-01-05 — End: 2024-01-05

## 2024-01-05 MED ORDER — HYDROMORPHONE BOLUS VIA INFUSION
1.0000 mg | INTRAVENOUS | Status: DC | PRN
  Administered 2024-01-05 (×2): 1 mg via INTRAVENOUS

## 2024-01-05 MED ORDER — GLYCOPYRROLATE 1 MG PO TABS: 1.0000 mg | ORAL_TABLET | ORAL | Status: DC | PRN

## 2024-01-05 MED ORDER — MIDAZOLAM-SODIUM CHLORIDE 100-0.9 MG/100ML-% IV SOLN
0.0000 mg/h | INTRAVENOUS | Status: DC
  Administered 2024-01-05: 1 mg/h via INTRAVENOUS
  Filled 2024-01-05: qty 100

## 2024-01-08 DIAGNOSIS — I429 Cardiomyopathy, unspecified: Secondary | ICD-10-CM | POA: Diagnosis not present

## 2024-01-08 DIAGNOSIS — I5023 Acute on chronic systolic (congestive) heart failure: Secondary | ICD-10-CM | POA: Diagnosis not present

## 2024-01-09 LAB — CULTURE, BLOOD (ROUTINE X 2)
Culture: NO GROWTH
Culture: NO GROWTH
Special Requests: ADEQUATE
Special Requests: ADEQUATE

## 2024-01-11 NOTE — Procedures (Signed)
 Extubation Procedure Note  Patient Details:   Name: Phillip Rich DOB: Aug 22, 1950 MRN: [id number]   Airway Documentation:    Vent end date: 07-Jan-2024 Vent end time: 1308   Evaluation  O2 sats: stable throughout Complications: No apparent complications Patient did tolerate procedure well. Bilateral Breath Sounds: Rhonchi, Diminished   No, pt could not speak post extubation.  Pt extubated to room air.  Phillip Rich 2024/01/07, 1:09 PM

## 2024-01-11 NOTE — Progress Notes (Signed)
 This chaplain responded to unit page in the space of the Pt. GOC meeting. The Pt. sister and friend are present at the bedside. An additional sister is virtually present for family requested prayer.   The chaplain listened reflectively as the family described the Pt. huge sense of humor and strong faith.  This chaplain offered F/U spiritual care as needed.  Chaplain Leeroy Hummer [phone]

## 2024-01-11 NOTE — Progress Notes (Signed)
 Time of death 85. No breath sounds or heart tones auscultated. Pronounced with Gaither Flint, RN. Sister, Rock, called and made aware.

## 2024-01-11 NOTE — Progress Notes (Signed)
 NAME:  Phillip Rich, MRN:  [id number], DOB:  Jan 22, 1951, LOS: 2 ADMISSION DATE:  01/03/2024, CONSULTATION DATE:  01/03/2024 REFERRING MD:  Lavanda Bolster, MD, CHIEF COMPLAINT:  Cardiac arrest  History of Present Illness:  73 y/o male with PMH for ILD, Pulmonary fibrosis, COPD not on home O2-still smoking, CAD, DMT2, HTN, Lung nodules, Renal mass s/p nephrectomy, PE off anticoagulation, HFrEF-20%, In late 2024 he was put on IV Milrinone  and evaluated for LVAD.  However, secondary to his severe lung disease and severely decreased DLCO, he was not a candidate for LVAD.  Milrinone  has significantly improved his quality of life.  Above h/x from chart review. Patient apparently was found unresponsive in his car at [employer]. [employer] started CPR.  He received shocks x 2 and found to be in V fib, 3 more shocks given, 300mg  Amiodarone given at scene along with epi x 3 and he was intubated in the field after being given 25mcg Fentanyl  and 2.5 mg Versed .   ABG PH 7.232, trop 844, WBC 23.3, TSH 9.122. It seems as though he was a DNR but got CPR inadvertently.  Pertinent  Medical History  COPD, CAD, DMT2, HTN, Renal mass s/p nephrectomy, PE, HFrEF-20%, Lung nodules  Significant Hospital Events: Including procedures, antibiotic start and stop dates in addition to other pertinent events   8/24: admit to ICU for out of hospital Vfib Arrest   Interim History / Subjective:  Having upward Gaze overnight, disconjugate, not responsive to pain Noted to also have decorticate posturing overnight  EEG Pending CT of head obtained   Objective    Blood pressure (!) 86/70, pulse 85, temperature 98.8 F (37.1 C), temperature source Bladder, resp. rate (!) 28, height 5' 9.02 (1.753 m), weight 78.4 kg, SpO2 98%. CVP:  [8 mmHg-22 mmHg] 19 mmHg  Vent Mode: PRVC FiO2 (%):  [40 %] 40 % Set Rate:  [28 bmp] 28 bmp Vt Set:  [560 mL] 560 mL PEEP:  [5 cmH20] 5 cmH20 Plateau Pressure:  [17 cmH20-20 cmH20] 19 cmH20    Intake/Output Summary (Last 24 hours) at January 24, 2024 0850 Last data filed at 01/24/24 0800 Gross per 24 hour  Intake 2192.92 ml  Output 2200 ml  Net -7.08 ml   Filed Weights   01/04/24 0000 2024-01-24 0457  Weight: 76.2 kg 78.4 kg    Examination: General: acute on chronic ill appearing elderly male, lying in icu bed on vent  HEENT: Normocephalic, PERRLA intact- sluggish, ETT, OG, Pink MM CV: s1,s2, RRR, no MRG, No JVD  pulm: clear, diminished, no distress on vent, thick secretions noted  Abs: bs active, soft  Extremities: no edema, no deformity, moves extremities- non purposefully  Skin: no rash  Neuro: Rass -1 to -2, cough gag reflex present, left upper extremity neglect noted on left upper extremity  GU: foley intact   Resolved problem list   Assessment and Plan  V fib cardiac arrest, Out of Hospital  NSTEMI- Troponin 844>>2235 End stage Cardiomyopathy EF 20% on IV Milrinone  Concern for Anoxic Brain Injury- CT of head- Questionable loss of gray-white differentiation in the bilateral deep gray nuclei- possible Anoxic brain injury  P: Continue cardiac telemetry Continue IV milrinone  gtt  Continue Levophed  gtt, wean down per MAP goal >65 Cardiology following, appreciate assistance  Continue heparin  gtt   Acute Hypoxic respiratory failure in setting of Cardiac Arrest  COPD/ILD/Pulmonary fibrosis-Currently not in exacerbation H/o PE not on anticoagulation Last Arterial Blood Gas result:  pO2 118, pCO2 45.3 pH  7.258  HCO3 20.4, %O2 Sat 98%  P:  Continue ventilator support and lung protective strategies  Continue LTVV  Wean PEEP and Fio2 requirements to sat goal of >92%  HOB > 30 degrees Plat < 30  Aim for Driving pressures < 15  follow cultures-blood- No growth to date  VAP and PAD protocols in place  Wean sedation as tolerated, SBT and WUA daily- minimal sedation as possible to assist with neuro exam, per WUA this AM- no purposeful movements- neglect on left upper  extremity noted  Obtain tracheal aspirate- patient having thick secretions- tan in color, concern for aspiration due to cardiac arrest/multiple vomiting instances around ETT  Continue cefepime , discontinue vanc since MRSA negative   Leukocytosis  23.3>19.0>13.7 P: Continue broad spectrum abx- cefepime  for aspiration  Continue to follow BC- No growth to date  Obtain tracheal aspirate   AKI Cr 1.47> 1.69 P: Continue to trend renal function daily  Continue to monitor and optimize electrolytes daily Continue to monitor urine output Continue strict I/Os Continue Adequate renal perfusion  Avoid nephrotoxic agents  Patient has Advance Directives- stating no dialysis/long-term life support   GOC  Will need to establish GOC with patient's emergency contact Patient chronically ill at baseline, and has out of hospital cardiac arrest with concerns of anoxic brain injury  P: POA documentation discovered overnight- patient designated Merilee Mikes to be POA in 2024. Have notified Merilee Mikes, will have GOC discussion today with POA and family  Recommend focusing on honoring patient's wishes in regards to advance directives  Overall prognosis is poor, will discuss GOC/comfort care with family    Best Practice (right click and Reselect all SmartList Selections daily)   Diet/type: NPO DVT prophylaxis prophylactic heparin   Pressure ulcer(s): N/A GI prophylaxis: H2B Lines: Central line Foley:  Yes, and it is still needed Code Status:  DNR  Critical care time: 50 mins   Christian Lamarion Mcevers AGACNP-BC   [facility] [facility] 02-01-2024, 10:32 AM  Please see [email or web address] for pager details.  From 7A-7P if no response, please call [phone]. After hours, please call ELink [phone].

## 2024-01-11 NOTE — Progress Notes (Signed)
 Heart Failure Navigator Progress Note  Assessed for Heart & Vascular TOC clinic readiness.  Patient does not meet criteria due to Advanced Heart Failure Team patient of Dr. Gasper Lloyd.   Navigator will sign off at this time.   Rhae Hammock, BSN, Scientist, clinical (histocompatibility and immunogenetics) Only

## 2024-01-11 NOTE — Death Summary Note (Signed)
 DEATH SUMMARY   Patient Details  Name: Phillip Rich MRN: [id number] DOB: December 06, 1950  Admission/Discharge Information   Admit Date:  January 30, 2024  Date of Death: Date of Death: Feb 01, 2024  Time of Death: Time of Death: 1334/02/06  Length of Stay: 2  Referring Physician: Charlott Dorn LABOR, MD   Reason(s) for Hospitalization  Cardiac arrest  Diagnoses  Preliminary cause of death:  Secondary Diagnoses (including complications and co-morbidities):  Principal Problem:   Cardiac arrest [facility]) OOH cardiac arrest Acute on chronic HFrEF, cardiogenic shock Acute on chronic respiratory failure with hypoxia and hypercapnia COPD with acute exacerbation History of ILD with progression over the last year radiographically. AKI on CKD 3b Hyperglycemia, uncontrolled Lactic acidosis due to cardiogenic shock, resolved Deconditioning Anemia due to critical illness H/o OSA Acute encephalopathy , concern for anoxic brain injury. H/o PE not on [facility] Course (including significant findings, care, treatment, and services provided and events leading to death)  Phillip Rich is a 73 y.o. year old male with PMH for ILD, Pulmonary fibrosis, COPD not on home O2-still smoking, CAD, DMT2, HTN, Lung nodules, Renal mass s/p nephrectomy, PE off anticoagulation, HFrEF-20%, In late 2023/02/07 he was put on IV Milrinone  and evaluated for LVAD.  However, secondary to his severe lung disease and severely decreased DLCO, he was not a candidate for LVAD.  Milrinone  has significantly improved his quality of life.  Above h/x from chart review. Patient apparently was found unresponsive in his car at [employer]. [employer] started CPR.  He received shocks x 2 and found to be in V fib, 3 more shocks given, 300mg  Amiodarone given at scene along with epi x 3 and he was intubated in the field after being given 25mcg Fentanyl  and 2.5 mg Versed .   ABG PH 7.232, trop 844, WBC 23.3, TSH 9.122. It seems as though he was a DNR but got  CPR inadvertently.  Family was presented at bedside and observed him failing to wake up off sedation. They informed us  that he would not want to live like this, and he had an advanced directive indicating he would want comfort measures in his current situation. All family members involved were in agreement, and he passed away with family at bedside.    Pertinent Labs and Studies  Significant Diagnostic Studies ECHOCARDIOGRAM COMPLETE Result Date: 01/04/2024    ECHOCARDIOGRAM REPORT   Patient Name:   Phillip Rich Date of Exam: 01/04/2024 Medical Rec #:  [id number]        Height:       69.0 in Accession #:    [id number]       Weight:       168.0 lb Date of Birth:  1950-12-16        BSA:          1.918 m Patient Age:    73 years         BP:           81/57 mmHg Patient Gender: M                HR:           80 bpm. Exam Location:  Inpatient Procedure: 2D Echo, Cardiac Doppler, Color Doppler and Intracardiac            Opacification Agent (Both Spectral and Color Flow Doppler were            utilized during procedure). Indications:    Cardiac arrest I46.9  History:        Patient has prior history of Echocardiogram examinations, most                 recent 01/29/2023. CHF and Cardiomyopathy, CAD,                 Signs/Symptoms:Shortness of Breath; Risk Factors:Hypertension,                 Diabetes and Current Smoker.  Sonographer:    Thea Norlander RCS Referring Phys: Nicki Furlan R GLEASON IMPRESSIONS  1. Left ventricular ejection fraction, by estimation, is <20%. The left ventricle has severely decreased function. The left ventricle demonstrates severe global hypokinesis and apical akinesis.. Left ventricular diastolic parameters are consistent with Grade I diastolic dysfunction (impaired relaxation). Elevated left atrial pressure.  2. Right ventricular systolic function is moderately reduced. The right ventricular size is mildly enlarged. Tricuspid regurgitation signal is inadequate for assessing PA pressure.   3. The mitral valve is normal in structure. Mild mitral valve regurgitation. No evidence of mitral stenosis.  4. The aortic valve is tricuspid. Aortic valve regurgitation is not visualized. Aortic valve sclerosis/calcification is present, without any evidence of aortic stenosis.  5. The inferior vena cava is dilated in size with >50% respiratory variability, suggesting right atrial pressure of 8 mmHg. FINDINGS  Left Ventricle: Left ventricular ejection fraction, by estimation, is <20%. The left ventricle has severely decreased function. The left ventricle demonstrates global hypokinesis and apical akinesis.. Definity  contrast agent was given IV to delineate the left ventricular endocardial borders. The left ventricular internal cavity size was normal in size. There is no left ventricular hypertrophy. Left ventricular diastolic parameters are consistent with Grade I diastolic dysfunction (impaired relaxation). Elevated left atrial pressure. Right Ventricle: The right ventricular size is mildly enlarged. No increase in right ventricular wall thickness. Right ventricular systolic function is moderately reduced. Tricuspid regurgitation signal is inadequate for assessing PA pressure. Left Atrium: Left atrial size was normal in size. Right Atrium: Right atrial size was normal in size. Pericardium: There is no evidence of pericardial effusion. Mitral Valve: The mitral valve is normal in structure. Mild mitral valve regurgitation. No evidence of mitral valve stenosis. Tricuspid Valve: The tricuspid valve is normal in structure. Tricuspid valve regurgitation is not demonstrated. No evidence of tricuspid stenosis. Aortic Valve: The aortic valve is tricuspid. Aortic valve regurgitation is not visualized. Aortic valve sclerosis/calcification is present, without any evidence of aortic stenosis. Aortic valve peak gradient measures 5.1 mmHg. Pulmonic Valve: The pulmonic valve was normal in structure. Pulmonic valve regurgitation  is not visualized. No evidence of pulmonic stenosis. Aorta: The aortic root is normal in size and structure. Venous: The inferior vena cava is dilated in size with greater than 50% respiratory variability, suggesting right atrial pressure of 8 mmHg. IAS/Shunts: The interatrial septum appears to be lipomatous. No atrial level shunt detected by color flow Doppler.  LEFT VENTRICLE PLAX 2D LVOT diam:     2.10 cm   Diastology LV SV:         37        LV e' medial:    3.59 cm/s LV SV Index:   19        LV E/e' medial:  17.8 LVOT Area:     3.46 cm  LV e' lateral:   3.32 cm/s                          LV  E/e' lateral: 19.2  RIGHT VENTRICLE            IVC RV S prime:     7.05 cm/s  IVC diam: 2.10 cm TAPSE (M-mode): 1.6 cm LEFT ATRIUM             Index        RIGHT ATRIUM          Index LA Vol (A2C):   29.2 ml 15.19 ml/m  RA Area:     9.73 cm LA Vol (A4C):   26.3 ml 13.71 ml/m  RA Volume:   21.40 ml 11.16 ml/m LA Biplane Vol: 29.7 ml 15.48 ml/m  AORTIC VALVE AV Area (Vmax): 2.44 cm AV Vmax:        113.00 cm/s AV Peak Grad:   5.1 mmHg LVOT Vmax:      79.70 cm/s LVOT Vmean:     47.700 cm/s LVOT VTI:       0.106 m  AORTA Ao Root diam: 3.50 cm Ao Asc diam:  3.50 cm MITRAL VALVE MV Area (PHT): 5.31 cm    SHUNTS MV Decel Time: 143 msec    Systemic VTI:  0.11 m MV E velocity: 63.90 cm/s  Systemic Diam: 2.10 cm MV A velocity: 89.60 cm/s MV E/A ratio:  0.71 Wilbert Bihari MD Electronically signed by Wilbert Bihari MD Signature Date/Time: 01/04/2024/4:26:04 PM    Final    EEG adult Result Date: 01/04/2024 Shelton Arlin KIDD, MD     01/04/2024 11:35 AM Patient Name: Phillip Rich MRN: [id number] Epilepsy Attending: Arlin KIDD Shelton Referring Physician/Provider: Gleason, Leita SAUNDERS, PA-C Date: 01/04/2024 Duration: 23.10 mins Patient history: 73 yo M with upward gaze overnight. EEG to evaluate for seizure Level of alertness: comatose/ lethargic AEDs during EEG study: LEV Technical aspects: This EEG study was done with scalp electrodes  positioned according to the 10-20 International system of electrode placement. Electrical activity was reviewed with band pass filter of 1-70Hz , sensitivity of 7 uV/mm, display speed of 58mm/sec with a 60Hz  notched filter applied as appropriate. EEG data were recorded continuously and digitally stored.  Video monitoring was available and reviewed as appropriate. Description: EEG showed continuous generalized 3-5Hz  theta-delta slowing. Hyperventilation and photic stimulation were not performed.   ABNORMALITY - Continuous slow, generalized IMPRESSION: This study is suggestive of severe diffuse encephalopathy. No seizures or epileptiform discharges were seen throughout the recording. Arlin KIDD Shelton   DG Abd 1 View Result Date: 01/04/2024 CLINICAL DATA:  Orogastric tube placement EXAM: ABDOMEN - 1 VIEW COMPARISON:  None Available. FINDINGS: Gastric tube within the body of the stomach. Side port below the GE junction. Lung bases clear. Bowel gas pattern unremarkable IMPRESSION: Gastric tube in stomach. Electronically Signed   By: Jackquline Boxer M.D.   On: 01/04/2024 09:20   CT HEAD WO CONTRAST ( ) Result Date: 01/04/2024 CLINICAL DATA:  73 year old male with neurologic deficit. Status post CPR, intubated. EXAM: CT HEAD WITHOUT CONTRAST TECHNIQUE: Contiguous axial images were obtained from the base of the skull through the vertex without intravenous contrast. RADIATION DOSE REDUCTION: This exam was performed according to the departmental dose-optimization program which includes automated exposure control, adjustment of the mA and/or kV according to patient size and/or use of iterative reconstruction technique. COMPARISON:  None Available. FINDINGS: Brain: Chronic encephalomalacia right middle frontal gyrus, frontal and temporal operculum. Cerebral volume is within normal limits for age.  Maintained sulci. On axial images there seems to be loss of bilateral gray-white differentiation at the deep  gray nuclei  (series 3, image 21). However, this is less apparent on the coronal and sagittal images (coronal image 36). Maintained gray-white differentiation elsewhere. No acute intracranial hemorrhage identified. No midline shift, mass effect, or evidence of intracranial mass lesion. No ventriculomegaly. Vascular: No suspicious intracranial vascular hyperdensity. Calcified atherosclerosis at the skull base. Skull: No acute or suspicious osseous lesion identified. Sinuses/Orbits: Visualized paranasal sinuses and mastoids are well aerated. Other: Endotracheal and oral enteric tubes partially visible. Associated mild fluid in the pharynx. Visualized orbit soft tissues are within normal limits. Visualized scalp soft tissues are within normal limits. IMPRESSION: 1. Questionable loss of gray-white differentiation in the bilateral deep gray nuclei, raising the possibility of Anoxic injury, but not definitive. If mental status remains decreased then repeat noncontrast Head CT in 24-48 hours or noncontrast MRI would best evaluate further. 2. No other No acute intracranial abnormality. Chronic right MCA territory encephalomalacia affecting the right frontal and temporal operculum. Electronically Signed   By: VEAR Hurst M.D.   On: 01/04/2024 05:18   DG Chest Portable 1 View Result Date: 01/03/2024 CLINICAL DATA:  cpr, intubation EXAM: PORTABLE CHEST 1 VIEW COMPARISON:  Chest x-ray 01/27/2023. CT chest 08/19/2023 FINDINGS: Endotracheal tube with tip terminating 7.5 cm above the carina. Right dialysis catheter with tip overlying the superior cavoatrial junction. Cardiac paddles are noted. The heart and mediastinal contours are within normal limits. No focal consolidation. Chronic coarsened interstitial markings. No pleural effusion. No pneumothorax. No acute osseous abnormality. IMPRESSION: 1. Endotracheal tube with tip terminating 7.5 cm above the carina. Recommend advancement by 2 cm. 2. Pulmonary fibrosis. Electronically Signed   By:  Morgane  Naveau M.D.   On: 01/03/2024 21:44    Microbiology Recent Results (from the past 240 hours)  MRSA Next Gen by PCR, Nasal     Status: None   Collection Time: 01/03/24 10:49 PM   Specimen: Nasal Mucosa; Nasal Swab  Result Value Ref Range Status   MRSA by PCR Next Gen NOT DETECTED NOT DETECTED Final    Comment: (NOTE) The GeneXpert MRSA Assay (FDA approved for NASAL specimens only), is one component of a comprehensive MRSA colonization surveillance program. It is not intended to diagnose MRSA infection nor to guide or monitor treatment for MRSA infections. Test performance is not FDA approved in patients less than 73 years old. Performed at [facility], [address] [place], [place] [address]   Culture, blood (Routine X 2) w Reflex to ID Panel     Status: None (Preliminary result)   Collection Time: 01/04/24  1:18 AM   Specimen: BLOOD  Result Value Ref Range Status   Specimen Description BLOOD BLOOD RIGHT HAND  Final   Special Requests   Final    BOTTLES DRAWN AEROBIC AND ANAEROBIC Blood Culture adequate volume   Culture   Final    NO GROWTH 1 DAY Performed at [facility], [address], [place], [place] [address]    Report Status PENDING  Incomplete  Culture, blood (Routine X 2) w Reflex to ID Panel     Status: None (Preliminary result)   Collection Time: 01/04/24  1:29 AM   Specimen: BLOOD  Result Value Ref Range Status   Specimen Description BLOOD BLOOD RIGHT ARM  Final   Special Requests   Final    BOTTLES DRAWN AEROBIC AND ANAEROBIC Blood Culture adequate volume   Culture   Final    NO GROWTH 1 DAY Performed at [facility],  [address] [place], [place] [address]    Report Status PENDING  Incomplete    Lab Basic Metabolic Panel: Recent Labs  Lab 01/03/24 2131 01/03/24 2138 01/03/24 2247 01/04/24 0127 01/04/24 0249 01/27/2024 0501  NA 136 137 134* 135 136 128*  K 4.4 4.4 4.8 4.5 4.8 3.7  CL 104  --   --  104  --  101  CO2  16*  --   --  19*  --  17*  GLUCOSE 283*  --   --  303*  --  314*  BUN 28*  --   --  38*  --  45*  CREATININE 1.44*  --   --  1.47*  --  1.69*  CALCIUM  8.3*  --   --  8.2*  --  7.5*  MG 2.1  --   --  1.9  --   --   PHOS  --   --   --  6.7*  --   --    Liver Function Tests: Recent Labs  Lab 01/03/24 2131  AST 108*  ALT 85*  ALKPHOS 156*  BILITOT 0.4  PROT 6.7  ALBUMIN 2.3*   No results for input(s): LIPASE, AMYLASE in the last 168 hours. No results for input(s): AMMONIA in the last 168 hours. CBC: Recent Labs  Lab 01/03/24 2131 01/03/24 2138 01/03/24 2247 01/04/24 0127 01/04/24 0249 01/27/2024 0501  WBC 23.3*  --   --  19.0*  --  13.7*  NEUTROABS 14.4*  --   --   --   --   --   HGB 10.5* 11.2* 11.2* 11.2* 10.9* 9.8*  HCT 35.3* 33.0* 33.0* 35.6* 32.0* 30.5*  MCV 96.7  --   --  91.5  --  90.8  PLT 332  --   --  278  --  259   Cardiac Enzymes: No results for input(s): CKTOTAL, CKMB, CKMBINDEX, TROPONINI in the last 168 hours. Sepsis Labs: Recent Labs  Lab 01/03/24 2131 01/04/24 0127 01/04/24 1043 01/27/2024 0501 01/27/24 0834  WBC 23.3* 19.0*  --  13.7*  --   LATICACIDVEN  --  1.6 2.1*  --  1.9    Procedures/Operations  none   Leita SHAUNNA Gaskins 01-27-2024, 2:56 PM

## 2024-01-11 NOTE — Progress Notes (Signed)
  Interdisciplinary Goals of Care Family Meeting   Date carried out:: January 08, 2024  Location of the meeting: Bedside  Member's involved: Family Member or next of kin and POA- Phillip Rich   Durable Power of Attorney or acting medical decision maker: Phillip Rich    Discussion: We discussed goals of care for Phillip Rich .  Discussed with POA and sister Phillip Rich,) in regards to overall status of the patient.  Unfortunately, patient is on multiple forms of life support-mechanical ventilation, and vasopressor support in the setting of V-fib cardiac arrest/out-of-hospital-with unknown downtime prior to EMS/[employer] arrival.  CT of head showing concerns of anoxic brain injury and patient having nonpurposeful neurological movements.  Per patient's advanced directives, patient would not want long-term life support/dialysis/long-term feedings.  Per sister at bedside as well as POA, patient would not want to be confined in a nursing facility as well.  Patient is also having a decrease in renal function since admission.  Discussed with POA and family, the focus should be on quality of life versus quantity.  Recommend transitioning to comfort and honoring the patient's wishes per patient's advance directives.  POA and sister in agreement of this in regards to changing patient to comfort care.   Code status: DNR/DNI  Disposition: Comfort care Call chaplain to bedside Initiate comfort care infusions-Versed /Dilaudid  infusion Once adequately on these infusions, can extubate Then turn off Levophed /milrinone    Time spent for the meeting: 35 mins   Phillip Rich AGACNP-BC   [facility] 2024-01-08, 12:38 PM  Please see [email or web address] for pager details.  From 7A-7P if no response, please call [phone]. After hours, please call ELink [phone].

## 2024-01-11 NOTE — Progress Notes (Signed)
 PHARMACY - ANTICOAGULATION CONSULT NOTE  Pharmacy Consult for heparin  infusion Indication: chest pain/ACS  Allergies  Allergen Reactions   Codeine Nausea And Vomiting   Crestor [Rosuvastatin Calcium ] Other (See Comments)    Leg aches   Erythromycin Rash    All Mycin drugs    Patient Measurements: Height: 5' 9.02 (175.3 cm) Weight: 76.2 kg (167 lb 15.9 oz) IBW/kg (Calculated) : 70.74 HEPARIN  DW (KG): 76.2  Vital Signs: Temp: 97.7 F (36.5 C) Feb 01, 2024 0100) BP: 102/85 01-Feb-2024 0100) Pulse Rate: 72 February 01, 2024 0100)  Labs: Recent Labs    01/03/24 2131 01/03/24 2138 01/03/24 2247 01/03/24 2324 01/04/24 0127 01/04/24 0249 01/04/24 2149  HGB 10.5*   < > 11.2*  --  11.2* 10.9*  --   HCT 35.3*   < > 33.0*  --  35.6* 32.0*  --   PLT 332  --   --   --  278  --   --   HEPARINUNFRC  --   --   --   --   --   --  0.16*  CREATININE 1.44*  --   --   --  1.47*  --   --   TROPONINIHS 844*  --   --  2,235*  --   --   --    < > = values in this interval not displayed.    Estimated Creatinine Clearance: 44.8 mL/min (A) (by C-G formula based on SCr of 1.47 mg/dL (H)).   Medical History: Past Medical History:  Diagnosis Date   Anxiety    BPH (benign prostatic hyperplasia)    Complication of anesthesia    Diabetes mellitus    DJD (degenerative joint disease)    GERD (gastroesophageal reflux disease)    HTN (hypertension)    Hyperlipidemia    ILD (interstitial lung disease) (HCC)    Left renal mass    Melanoma (HCC)    Mild sleep apnea    Sleep study pending   PONV (postoperative nausea and vomiting)     Medications:  Scheduled:   arformoterol   15 mcg Nebulization BID   aspirin   81 mg Per Tube Daily   budesonide  (PULMICORT ) nebulizer solution  0.25 mg Nebulization BID   Chlorhexidine  Gluconate Cloth  6 each Topical Daily   docusate  100 mg Per Tube BID   insulin  aspart  0-15 Units Subcutaneous Q4H   mouth rinse  15 mL Mouth Rinse Q2H   pantoprazole  (PROTONIX ) IV  40 mg  Intravenous Q24H   polyethylene glycol  17 g Per Tube Daily   revefenacin   175 mcg Nebulization Daily   Infusions:   ceFEPime  (MAXIPIME ) IV Stopped (01/04/24 2214)   heparin  900 Units/hr (2024-02-01 0000)   milrinone  0.125 mcg/kg/min (02/01/24 0000)   norepinephrine  (LEVOPHED ) Adult infusion 14 mcg/min (02-01-24 0028)   propofol  (DIPRIVAN ) infusion 40 mcg/kg/min (02-01-2024 0000)   vancomycin  166.7 mL/hr at 01-Feb-2024 0000    Assessment: 73 yo M presenting after out of hospital Vfib arrest. Troponin found to be elevated, concern for NSTEMI. PMH significant for CAD, PE off anticoagulation. Not on anticoagulation PTA. Pharmacy consulted for heparin  management.  Hgb (10.9) and platelets (279) are stable. No signs of bleeding per RN.  2024/02/01 AM update:  Heparin  level sub-therapeutic   Goal of Therapy:  Heparin  level 0.3-0.7 units/ml Monitor platelets by anticoagulation protocol: Yes   Plan:  Inc heparin  to 1050 units/hr Heparin  level in 8 hours  Lynwood Mckusick, PharmD, BCPS Clinical Pharmacist Phone: [phone]

## 2024-01-11 DEATH — deceased

## 2024-01-13 ENCOUNTER — Ambulatory Visit (HOSPITAL_BASED_OUTPATIENT_CLINIC_OR_DEPARTMENT_OTHER)

## 2024-01-18 IMAGING — DX DG SHOULDER 2+V*L*
3 series · 3 of 3 positions shown · non-contrast
Comparison: None Available.

CLINICAL DATA: Left shoulder pain.  Chronic pain.

EXAM:
LEFT SHOULDER - 2+ VIEW

[dg shoulder left (1 of 3)]
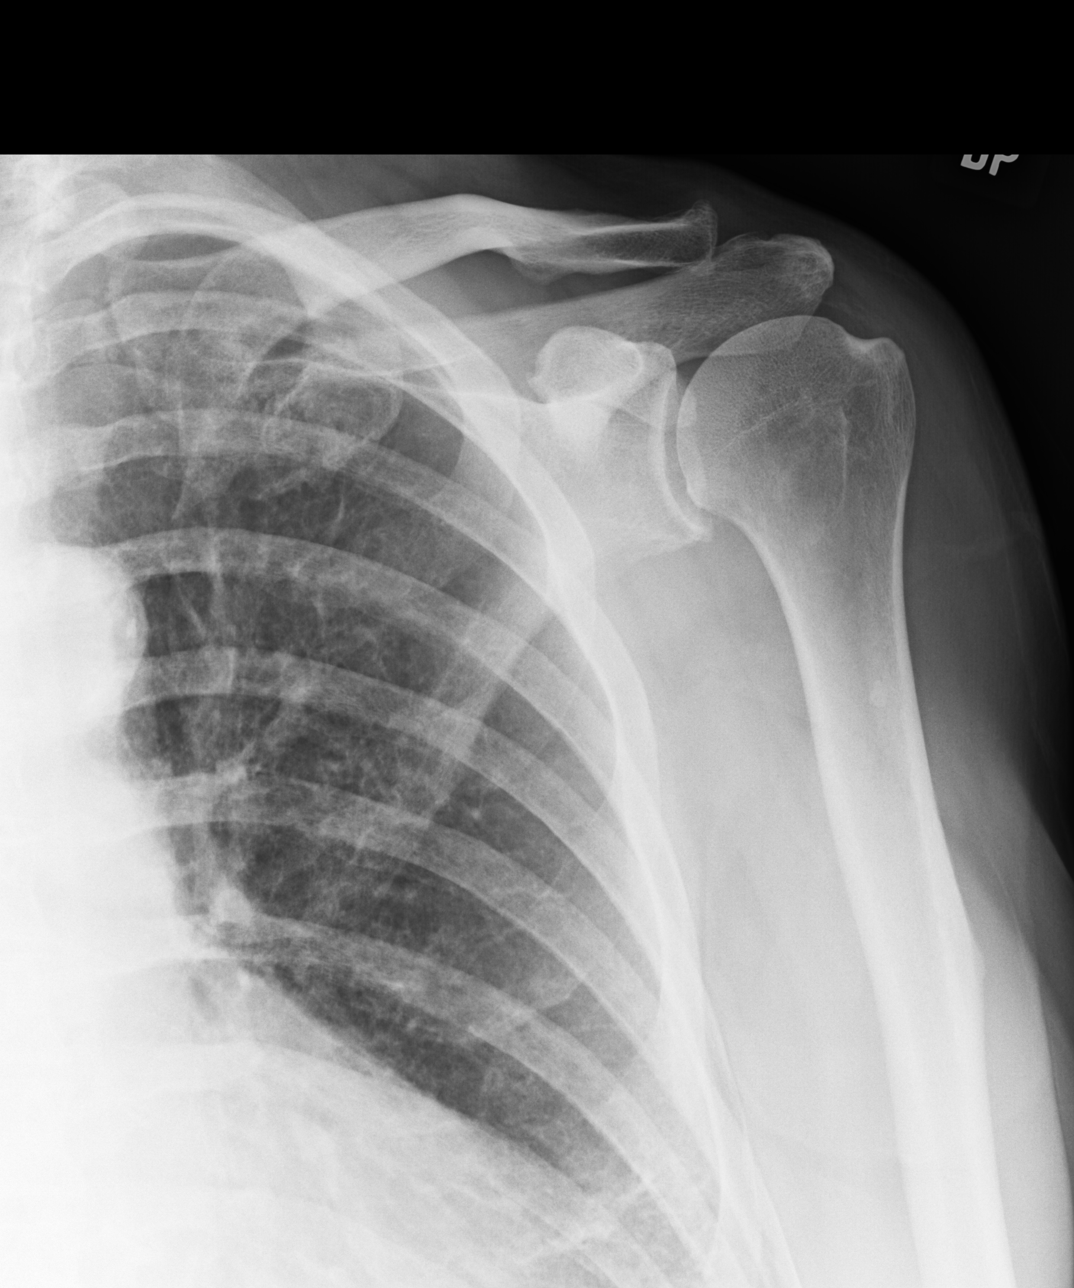

[dg shoulder left (2 of 3)]
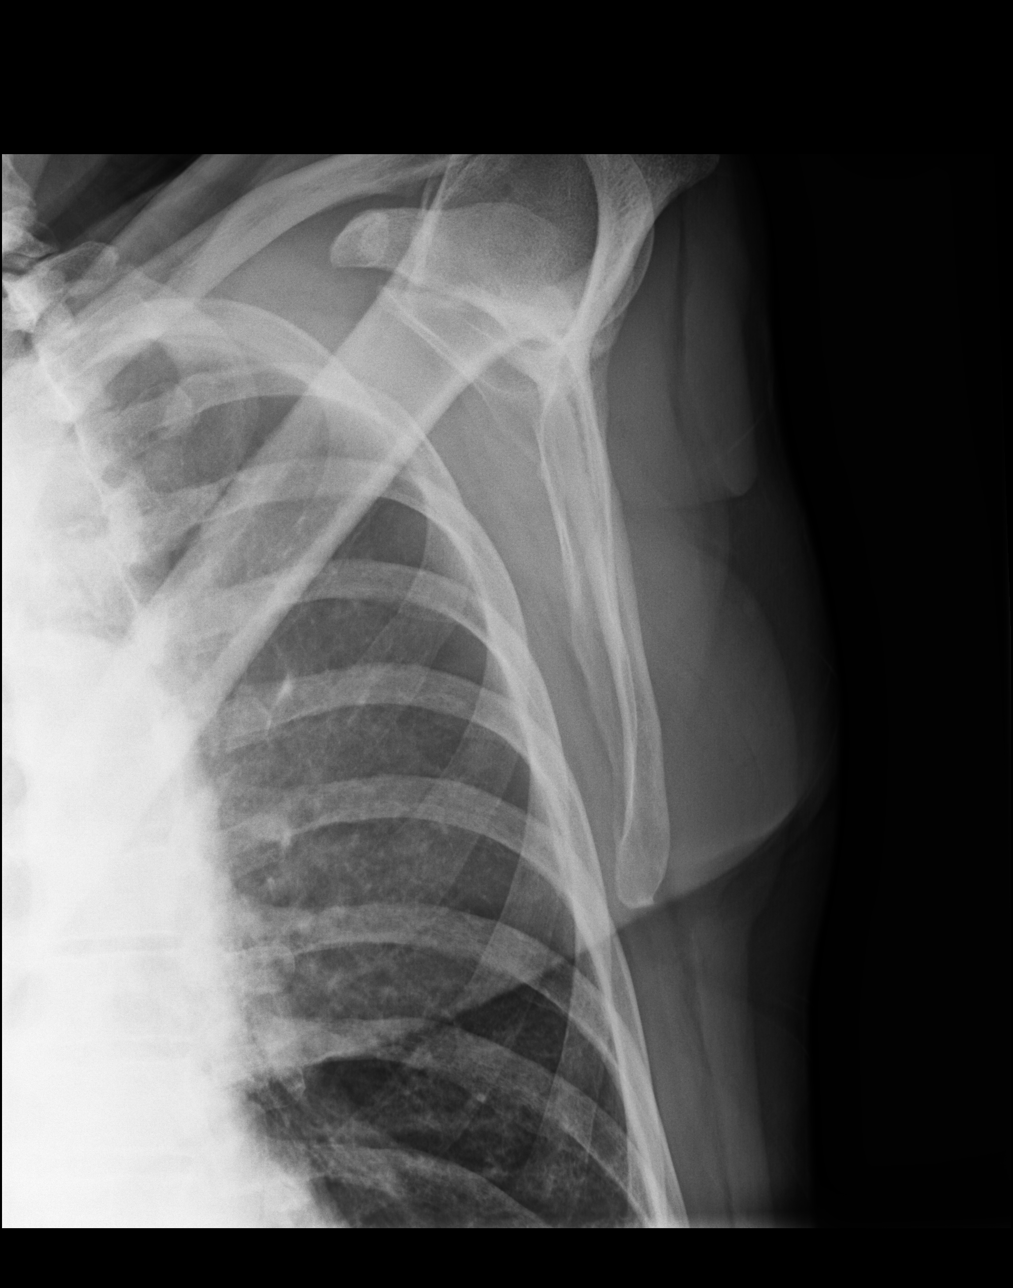

[dg shoulder left (3 of 3)]
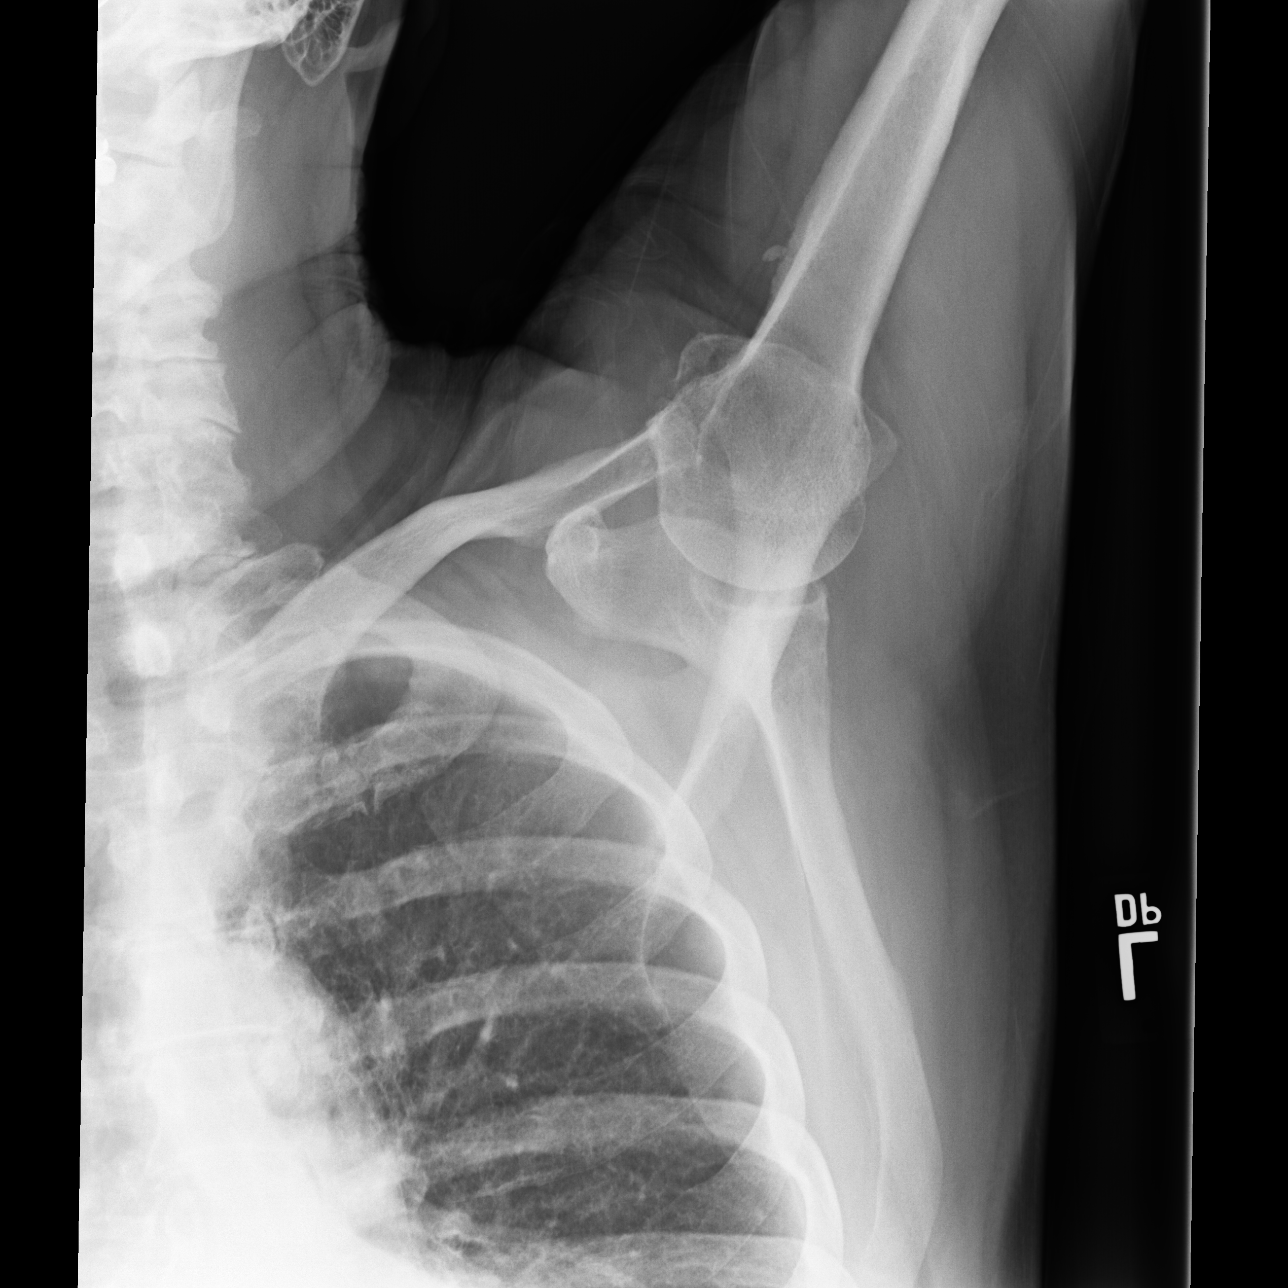

[3 of 3 positions shown; findings below may reference images not displayed]

FINDINGS: Normal alignment. Minimal acromioclavicular degenerative spurring.
Minimal inferior glenohumeral spurring. No fracture. No erosions,
evidence of a vascular necrosis or periosteal reaction. No focal
bone lesion. No soft tissue calcifications.
IMPRESSION: Minimal osteoarthritis of the left acromioclavicular and
glenohumeral joints.

## 2024-02-10 ENCOUNTER — Ambulatory Visit: Admitting: Nurse Practitioner

## 2024-02-12 ENCOUNTER — Encounter (HOSPITAL_COMMUNITY): Admitting: Cardiology
